# Patient Record
Sex: Female | Born: 1975 | Race: White | Hispanic: No | Smoking: Never smoker
Health system: Southern US, Community
[De-identification: ages and names within clinical notes are randomized; demographics above are authoritative.]

## PROBLEM LIST (undated history)

## (undated) DIAGNOSIS — K219 Gastro-esophageal reflux disease without esophagitis: Secondary | ICD-10-CM

## (undated) DIAGNOSIS — D649 Anemia, unspecified: Secondary | ICD-10-CM

## (undated) DIAGNOSIS — R0902 Hypoxemia: Secondary | ICD-10-CM

## (undated) DIAGNOSIS — J45909 Unspecified asthma, uncomplicated: Secondary | ICD-10-CM

## (undated) DIAGNOSIS — Z9889 Other specified postprocedural states: Secondary | ICD-10-CM

## (undated) DIAGNOSIS — I82409 Acute embolism and thrombosis of unspecified deep veins of unspecified lower extremity: Secondary | ICD-10-CM

## (undated) DIAGNOSIS — F32A Depression, unspecified: Secondary | ICD-10-CM

## (undated) DIAGNOSIS — M1990 Unspecified osteoarthritis, unspecified site: Secondary | ICD-10-CM

## (undated) DIAGNOSIS — R112 Nausea with vomiting, unspecified: Secondary | ICD-10-CM

## (undated) DIAGNOSIS — G4730 Sleep apnea, unspecified: Secondary | ICD-10-CM

## (undated) DIAGNOSIS — I8390 Asymptomatic varicose veins of unspecified lower extremity: Secondary | ICD-10-CM

## (undated) DIAGNOSIS — K5732 Diverticulitis of large intestine without perforation or abscess without bleeding: Secondary | ICD-10-CM

## (undated) HISTORY — DX: Asymptomatic varicose veins of unspecified lower extremity: I83.90

## (undated) HISTORY — DX: Acute embolism and thrombosis of unspecified deep veins of unspecified lower extremity: I82.409

## (undated) HISTORY — DX: Unspecified asthma, uncomplicated: J45.909

## (undated) HISTORY — DX: Anemia, unspecified: D64.9

## (undated) HISTORY — DX: Depression, unspecified: F32.A

## (undated) HISTORY — DX: Hypoxemia: R09.02

## (undated) HISTORY — PX: THROMBECTOMY: PRO61

## (undated) HISTORY — PX: ABDOMINAL HYSTERECTOMY: SHX81

---

## 1998-10-11 ENCOUNTER — Other Ambulatory Visit: Admission: RE | Admit: 1998-10-11 | Discharge: 1998-10-11 | Payer: Self-pay | Admitting: Family Medicine

## 1999-11-03 ENCOUNTER — Other Ambulatory Visit: Admission: RE | Admit: 1999-11-03 | Discharge: 1999-11-03 | Payer: Self-pay | Admitting: Family Medicine

## 2000-11-23 ENCOUNTER — Other Ambulatory Visit: Admission: RE | Admit: 2000-11-23 | Discharge: 2000-11-23 | Payer: Self-pay | Admitting: Family Medicine

## 2002-02-24 ENCOUNTER — Other Ambulatory Visit: Admission: RE | Admit: 2002-02-24 | Discharge: 2002-02-24 | Payer: Self-pay | Admitting: Family Medicine

## 2003-04-12 ENCOUNTER — Other Ambulatory Visit: Admission: RE | Admit: 2003-04-12 | Discharge: 2003-04-12 | Payer: Self-pay | Admitting: Internal Medicine

## 2004-04-01 ENCOUNTER — Ambulatory Visit: Payer: Self-pay | Admitting: Family Medicine

## 2004-05-01 ENCOUNTER — Ambulatory Visit: Payer: Self-pay | Admitting: General Surgery

## 2004-05-14 HISTORY — PX: CHOLECYSTECTOMY: SHX55

## 2004-10-23 ENCOUNTER — Ambulatory Visit: Payer: Self-pay | Admitting: Family Medicine

## 2004-12-19 ENCOUNTER — Other Ambulatory Visit: Admission: RE | Admit: 2004-12-19 | Discharge: 2004-12-19 | Payer: Self-pay | Admitting: Family Medicine

## 2004-12-19 ENCOUNTER — Ambulatory Visit: Payer: Self-pay | Admitting: Family Medicine

## 2004-12-19 LAB — CONVERTED CEMR LAB: Pap Smear: NORMAL

## 2005-01-13 ENCOUNTER — Ambulatory Visit: Payer: Self-pay | Admitting: Family Medicine

## 2005-10-02 ENCOUNTER — Ambulatory Visit: Payer: Self-pay | Admitting: Family Medicine

## 2005-12-21 ENCOUNTER — Ambulatory Visit: Payer: Self-pay | Admitting: Family Medicine

## 2006-04-20 ENCOUNTER — Ambulatory Visit: Payer: Self-pay | Admitting: Family Medicine

## 2006-05-06 ENCOUNTER — Encounter: Payer: Self-pay | Admitting: Family Medicine

## 2006-05-06 ENCOUNTER — Ambulatory Visit: Payer: Self-pay | Admitting: Obstetrics & Gynecology

## 2006-05-27 ENCOUNTER — Ambulatory Visit: Payer: Self-pay | Admitting: Obstetrics & Gynecology

## 2006-12-07 ENCOUNTER — Encounter: Payer: Self-pay | Admitting: Family Medicine

## 2006-12-07 ENCOUNTER — Ambulatory Visit: Payer: Self-pay | Admitting: Family Medicine

## 2006-12-21 ENCOUNTER — Ambulatory Visit: Payer: Self-pay | Admitting: Nurse Practitioner

## 2006-12-29 ENCOUNTER — Ambulatory Visit: Payer: Self-pay | Admitting: Obstetrics & Gynecology

## 2007-03-18 ENCOUNTER — Ambulatory Visit: Payer: Self-pay | Admitting: Family Medicine

## 2007-03-18 DIAGNOSIS — A5901 Trichomonal vulvovaginitis: Secondary | ICD-10-CM | POA: Insufficient documentation

## 2007-03-24 ENCOUNTER — Encounter: Payer: Self-pay | Admitting: Family Medicine

## 2007-05-30 ENCOUNTER — Encounter: Payer: Self-pay | Admitting: Family Medicine

## 2007-06-03 ENCOUNTER — Encounter: Payer: Self-pay | Admitting: Family Medicine

## 2007-07-20 ENCOUNTER — Ambulatory Visit: Payer: Self-pay | Admitting: Family Medicine

## 2007-09-14 ENCOUNTER — Encounter: Payer: Self-pay | Admitting: Family Medicine

## 2007-09-21 ENCOUNTER — Ambulatory Visit: Payer: Self-pay | Admitting: Family Medicine

## 2007-09-21 DIAGNOSIS — R030 Elevated blood-pressure reading, without diagnosis of hypertension: Secondary | ICD-10-CM | POA: Insufficient documentation

## 2007-10-05 ENCOUNTER — Encounter: Payer: Self-pay | Admitting: Family Medicine

## 2008-01-03 ENCOUNTER — Ambulatory Visit: Payer: Self-pay | Admitting: Family Medicine

## 2008-01-03 DIAGNOSIS — N760 Acute vaginitis: Secondary | ICD-10-CM | POA: Insufficient documentation

## 2008-01-04 LAB — CONVERTED CEMR LAB
Basophils Absolute: 0 10*3/uL (ref 0.0–0.1)
Eosinophils Absolute: 0.2 10*3/uL (ref 0.0–0.7)
Eosinophils Relative: 1.8 % (ref 0.0–5.0)
HCT: 42.8 % (ref 36.0–46.0)
MCHC: 34.1 g/dL (ref 30.0–36.0)
MCV: 91.1 fL (ref 78.0–100.0)
Monocytes Absolute: 0.6 10*3/uL (ref 0.1–1.0)
Neutrophils Relative %: 59.8 % (ref 43.0–77.0)
Platelets: 289 10*3/uL (ref 150–400)
RDW: 11.7 % (ref 11.5–14.6)
TSH: 0.92 microintl units/mL (ref 0.35–5.50)
WBC: 8.6 10*3/uL (ref 4.5–10.5)

## 2008-02-29 ENCOUNTER — Ambulatory Visit: Payer: Self-pay | Admitting: Family Medicine

## 2008-02-29 DIAGNOSIS — N926 Irregular menstruation, unspecified: Secondary | ICD-10-CM | POA: Insufficient documentation

## 2008-03-01 LAB — CONVERTED CEMR LAB
Hemoglobin: 14.1 g/dL (ref 12.0–15.0)
hCG, Beta Chain, Quant, S: <0.5 milliintl units/mL

## 2008-04-02 ENCOUNTER — Telehealth: Payer: Self-pay | Admitting: Family Medicine

## 2008-04-02 ENCOUNTER — Other Ambulatory Visit: Admission: RE | Admit: 2008-04-02 | Discharge: 2008-04-02 | Payer: Self-pay | Admitting: Family Medicine

## 2008-04-02 ENCOUNTER — Ambulatory Visit: Payer: Self-pay | Admitting: Family Medicine

## 2008-04-05 ENCOUNTER — Encounter (INDEPENDENT_AMBULATORY_CARE_PROVIDER_SITE_OTHER): Payer: Self-pay | Admitting: *Deleted

## 2008-05-11 ENCOUNTER — Telehealth: Payer: Self-pay | Admitting: Family Medicine

## 2008-07-16 ENCOUNTER — Telehealth: Payer: Self-pay | Admitting: Family Medicine

## 2009-05-27 ENCOUNTER — Ambulatory Visit: Payer: Self-pay | Admitting: Family Medicine

## 2009-10-21 ENCOUNTER — Ambulatory Visit: Payer: Self-pay | Admitting: Family Medicine

## 2009-10-21 DIAGNOSIS — M542 Cervicalgia: Secondary | ICD-10-CM | POA: Insufficient documentation

## 2009-10-21 DIAGNOSIS — S20219A Contusion of unspecified front wall of thorax, initial encounter: Secondary | ICD-10-CM | POA: Insufficient documentation
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
## 2010-05-13 NOTE — Letter (Signed)
Summary: Out of Work  [facility]  [address] [place], [place] [address]   Phone: [phone]  Fax: [phone]    May 27, 2009   Employee:  Kari Rice    To Whom It May Concern:   For Medical reasons, please excuse the above named employee from work for the following dates:  Start:   05/26/2009  End:   05/29/2009 if she is feeling better   If you need additional information, please feel free to contact our office.         Sincerely,    Judith Part MD

## 2010-05-13 NOTE — Assessment & Plan Note (Signed)
Summary: BODY ACHES AND FEVER / LFW   Vital Signs:  Patient profile:   35 year old female Height:      64 inches Weight:      210 pounds BMI:     36.18 Temp:     98.9 degrees F oral Pulse rate:   88 / minute Pulse rhythm:   regular BP sitting:   110 / 74  (left arm) Cuff size:   regular  Vitals Entered By: Lewanda Rife LPN (May 27, 2009 4:10 PM)  History of Present Illness: started getting sick 8 days ago stuffy nose and st -- then started feeling better fri  worse on saturday - cough and chest tightness and soreness runny and stuffy nose   no more st  non productive cough --- ratlling however  not wheezing  no n/v d   was running 100.3 fever this am -- took advil complete   ears feel full and clogged  some facial pain above both eyes     Allergies: 1)  Penicillin  Past History:  Past Medical History: Last updated: 09/21/2007 recurrent trichomonas- seeing ID   ID-- Dr Leavy Cella  Past Surgical History: Last updated: 01/03/2008 CT abd- gallstoens (03/2006) Cholecystectomy Pelvix [place]- neg (12/2004) Trichomonas- tx'd with flagyl (2007-2008) wisdom teeth ext 09  Family History: Last updated: 07/20/2007 Father: MI age 70- CAD, smoker, HTN, OA Mother: RA Siblings: 1 brother  Social History: Last updated: 04/02/2008 Marital Status: Married Children:  Occupation: [employer] non smoker no alcohol   Risk Factors: Smoking Status: never (03/18/2007)  Review of Systems General:  Complains of chills, fatigue, fever, loss of appetite, and malaise. Eyes:  Denies blurring and discharge. ENT:  Complains of hoarseness, nasal congestion, postnasal drainage, sinus pressure, and sore throat. CV:  Denies chest pain or discomfort and palpitations. Resp:  Complains of cough and wheezing. GI:  Denies abdominal pain, bloody stools, and change in bowel habits. Derm:  Denies itching, lesion(s), poor wound healing, and rash. Neuro:  Denies headaches.  Physical  Exam  General:  overweight but generally well appearing  Head:  normocephalic, atraumatic, and no abnormalities observed.  no sinus tenderness Eyes:  vision grossly intact, pupils equal, pupils round, pupils reactive to light, and no injection.   Lungs:  CTA wiht harsh bs and rhonchi at bases    Impression & Recommendations:  Problem # 1:  ACUTE BRONCHITIS (ICD-466.0) Assessment New with persistant cough after uri and worsening symptoms with fever cover with zithromax for poss bacteria recommend sympt care- see pt instructions   pt advised to update me if symptoms worsen or do not improve - esp if wheeze or worse cough The following medications were removed from the medication list:    Tindamax 500 Mg Tabs (Tinidazole) .Marland KitchenMarland KitchenMarland KitchenMarland Kitchen 4 by mouth once daily for five days Her updated medication list for this problem includes:    Advil Cold/sinus 30-200 Mg Tabs (Pseudoephedrine-ibuprofen) ..... Otc as directed.    Zithromax Z-pak 250 Mg Tabs (Azithromycin) .Marland Kitchen... Take by mouth as directed  Orders: Prescription Created Electronically [phone] [phone])  Complete Medication List: 1)  Advil Cold/sinus 30-200 Mg Tabs (Pseudoephedrine-ibuprofen) .... Otc as directed. 2)  Zithromax Z-pak 250 Mg Tabs (Azithromycin) .... Take by mouth as directed  Patient Instructions: 1)  you can try mucinex DM  over the counter twice daily as directed and nasal saline spray for congestion 2)  tylenol over the counter as directed may help with aches, headache and fever 3)  call if symptoms worsen  or if not improved in 4-5 days  4)  take the zithromax as directed  5)  I sent your px to the pharmacy Prescriptions: ZITHROMAX Z-PAK 250 MG TABS (AZITHROMYCIN) take by mouth as directed  #1pack x 0   Entered and Authorized by:   Judith Part MD   Signed by:   Judith Part MD on 05/27/2009   Method used:   Electronically to        [facility] [address] (retail)       [address]       [place], [place]   [address]       Ph: [phone]       Fax: [phone]   RxID:   [id number]   Current Allergies (reviewed today): PENICILLIN

## 2010-06-02 ENCOUNTER — Ambulatory Visit: Payer: Self-pay | Admitting: Family Medicine

## 2010-06-04 ENCOUNTER — Ambulatory Visit (INDEPENDENT_AMBULATORY_CARE_PROVIDER_SITE_OTHER): Payer: BC Managed Care – PPO | Admitting: Family Medicine

## 2010-06-04 ENCOUNTER — Other Ambulatory Visit: Payer: Self-pay | Admitting: Family Medicine

## 2010-06-04 ENCOUNTER — Ambulatory Visit (INDEPENDENT_AMBULATORY_CARE_PROVIDER_SITE_OTHER)
Admission: RE | Admit: 2010-06-04 | Discharge: 2010-06-04 | Disposition: A | Discharge status: Home / Self Care | Payer: BC Managed Care – PPO | Source: Ambulatory Visit | Attending: Family Medicine | Admitting: Family Medicine

## 2010-06-04 ENCOUNTER — Encounter: Payer: Self-pay | Admitting: Family Medicine

## 2010-06-04 DIAGNOSIS — M25569 Pain in unspecified knee: Secondary | ICD-10-CM | POA: Insufficient documentation

## 2010-06-10 NOTE — Assessment & Plan Note (Signed)
Summary: RIGHT KNEE PAIN/CLE   [employer]   Vital Signs:  Patient profile:   35 year old female Height:      64 inches Weight:      215.50 pounds BMI:     37.12 Temp:     98.7 degrees F oral Pulse rate:   66 / minute Pulse rhythm:   regular BP sitting:   110 / 74  (left arm) Cuff size:   regular  Vitals Entered ByMelody Comas (June 04, 2010 10:10 AM) CC: right knee pain   History of Present Illness: 35 year old female:  Patient presents with several month h/o r sided knee pain after no specific injury.No symptomatic giving-way. No mechanical clicking - but there is crepitus. Joint has not locked up. Patient has been able to walk  The patient does have pain going up and down stairs or rising from a seated position.   Pain location: anterior and lateral Current physical activity: minimal Prior Knee Surgery: none Current pain meds: tylenol and advil Bracing: none Occupation or school level:   moved to [place] recently. now has stairs in her apartment.  tight in the anterior knee.   Tylenol and adbill not helping.   down stairs bothers her.  not as bad rising from a seated position.     Allergies: 1)  Penicillin  Past History:  Past medical, surgical, family and social histories (including risk factors) reviewed, and no changes noted (except as noted below).  Past Medical History: Reviewed history from 09/21/2007 and no changes required. recurrent trichomonas- seeing ID   ID-- Dr Leavy Cella  Past Surgical History: Reviewed history from 01/03/2008 and no changes required. CT abd- gallstoens (03/2006) Cholecystectomy Pelvix [place]- neg (12/2004) Trichomonas- tx'd with flagyl (2007-2008) wisdom teeth ext 09  Family History: Reviewed history from 07/20/2007 and no changes required. Father: MI age 43- CAD, smoker, HTN, OA Mother: RA Siblings: 1 brother  Social History: Reviewed history from 04/02/2008 and no changes required. Marital Status:  Married Children:  Occupation: [employer] non smoker no alcohol   Review of Systems       REVIEW OF SYSTEMS  GEN: No systemic complaints, no fevers, chills, sweats, or other acute illnesses MSK: Detailed in the HPI GI: tolerating PO intake without difficulty Neuro: No numbness, parasthesias, or tingling associated. Otherwise the pertinent positives of the ROS are noted above.    Physical Exam  General:  GEN: Well-developed,well-nourished,in no acute distress; alert,appropriate and cooperative throughout examination HEENT: Normocephalic and atraumatic without obvious abnormalities. No apparent alopecia or balding. Ears, externally no deformities PULM: Breathing comfortably in no respiratory distress EXT: No clubbing, cyanosis, or edema PSYCH: Normally interactive. Cooperative during the interview. Pleasant. Friendly and conversant. Not anxious or depressed appearing. Normal, full affect.  Msk:  R knee Gait: Normal heel toe pattern ROM: WNL Effusion: mild Echymosis or edema: none Patellar tendon NT Painful PLICA: neg Patellar grind: POSITIVE Medial and lateral patellar facet loading: MILD PAIN medial and lateral joint lines: LATERAL JOINT LINE TTP Mcmurray's neg Flexion-pinch neg Varus and valgus stress: stable Lachman: neg Ant and Post drawer: neg Hip abduction, IR, ER: WNL str testing nt   Impression & Recommendations:  Problem # 1:  KNEE PAIN, RIGHT (ICD-719.46) X-rays: AP Bilateral Weight-bearing, Weightbearing Lateral, Sunrise views Indication: knee pain Findings:  small caudal spur on superior patella. mild lateral displacement on sunrise views. no fracture or dislocatoin.  PFS with suspected at least mild OA PF joint lateral pain, cannot exclude mensical pathology  Lodine, rest, then Harvard knee HEP  Knee Injection, R Patient verbally consented to procedure. Risks, benefits, and alternatives explained. Sterilely prepped with betadine. Ethyl cholride used for  anesthesia. 9 cc Lidocaine 1% mixed with 1 cc of Kenalog 40 mg injected using the anterolateral approach without difficulty. No complications with procedure and tolerated well. Patient had decreased pain post-injection.   The following medications were removed from the medication list:    Flexeril 10 Mg Tabs (Cyclobenzaprine hcl) .Marland Kitchen... 1/2 to 1 by mouth up to three times a day as needed muscle spasm 30 Her updated medication list for this problem includes:    Advil 200 Mg Tabs (Ibuprofen) ..... Otc as directed.    Tylenol Extra Strength 500 Mg Tabs (Acetaminophen) ..... Otc as directed.    Etodolac 400 Mg Tabs (Etodolac) .Marland Kitchen... 1 by mouth two times a day  Orders: T-Knee Right 2 view (73560TC) T-DG Knee Bilateral Standing AP (04540) Joint Aspirate / Injection, Large (20610) Kenalog 10mg  (4units) (J3301)  Problem # 2:  PATELLO-FEMORAL SYNDROME (ICD-719.46)  The following medications were removed from the medication list:    Flexeril 10 Mg Tabs (Cyclobenzaprine hcl) .Marland Kitchen... 1/2 to 1 by mouth up to three times a day as needed muscle spasm 30 Her updated medication list for this problem includes:    Advil 200 Mg Tabs (Ibuprofen) ..... Otc as directed.    Tylenol Extra Strength 500 Mg Tabs (Acetaminophen) ..... Otc as directed.    Etodolac 400 Mg Tabs (Etodolac) .Marland Kitchen... 1 by mouth two times a day  Orders: Joint Aspirate / Injection, Large (20610) Kenalog 10mg  (4units) (J3301)  Complete Medication List: 1)  Advil 200 Mg Tabs (Ibuprofen) .... Otc as directed. 2)  Tylenol Extra Strength 500 Mg Tabs (Acetaminophen) .... Otc as directed. 3)  Etodolac 400 Mg Tabs (Etodolac) .Marland Kitchen.. 1 by mouth two times a day Prescriptions: ETODOLAC 400 MG TABS (ETODOLAC) 1 by mouth two times a day  #60 x 2   Entered and Authorized by:   Hannah Beat MD   Signed by:   Hannah Beat MD on 06/04/2010   Method used:   Print then Give to Patient   RxID:   [id number]    Orders Added: 1)  T-Knee Right 2  view [73560TC] 2)  T-DG Knee Bilateral Standing AP [73565] 3)  Est. Patient Level IV [08657] 4)  Joint Aspirate / Injection, Large [20610] 5)  Kenalog 10mg  (4units) [J3301]    Current Allergies (reviewed today): PENICILLIN

## 2010-08-26 NOTE — Assessment & Plan Note (Signed)
Kari Rice, Kari Rice                 ACCOUNT NO.:  [id number]   MEDICAL RECORD NO.:  [id number]          PATIENT TYPE:  POB   LOCATION:  [facility]         FACILITY:  [facility]   PHYSICIAN:  Tinnie Gens, MD        DATE OF BIRTH:  [birth year]   DATE OF SERVICE:  12/21/2006                                  CLINIC NOTE   HISTORY OF PRESENT ILLNESS:  The patient comes to the office today for  recurrent _______trich.  She has had multiple episodes of this starting  last year.  She was diagnosed by her primary care physician, as having  had trichomonas twice.  She was treated on both of those occasions.  On  her third time to return to his office, she was then sent to the  gynecologist.  In this office she had been treated once.  When the trich  _________recurred at this point, she was given a long-term treatment of  vinegar and water to use for 3 months when the discharge became heavy.  She is in the office today desiring insertion of an IUD for regulation  of her menstrual flow as well as a recheck on her trichomonas.  She is  not sexually active.  Has not been sexually active in approximately 1  year.  She has been using a vibrator.  She does feel that she keeps this  clean and has been using antiseptic wipes.  She has been on birth  control pills in the past for many right and has not had any issues with  taking birth control pills.  She is not a smoker.  She denies migraine  headaches.  She does not have hypertension.   VITAL SIGNS:  Blood pressure is 116/76, pulse is 88, weight is 208.   PHYSICAL EXAMINATION:  GENITOURINARY:  Genitalia exam externally she is  quite excoriated from throughout most of her perineal area.  She does  have a glistening discharge externally.  Vaginally, she has a thick  greenish odorous discharge.  Cervix is closed, easily friable.  Bimanual  exam there is no cervical motion tenderness and no adnexal  lymphadenopathy.   ASSESSMENT/PLAN:  1. Recurrent  trichomonas.  The patient has asked at this point to      replace her vibrator.  We will repeat her wet prep, gonorrhea and      chlamydia today.  We will also check human immunodeficiency virus,      hepatitis C, herpes simplex virus RPR, chem-20 and complete blood      count.  2. External yeast.  She is given Diflucan 150 milligrams 1 time dose.      She is given several refills on this.  She is given a lengthy      instruction on loose cotton clothing, going without underwear at      bedtime, using a hair dryer on cool.  3. Menstrual regulation.  We have agreed that this is certainly not      the time to use an IUD.  She is using the IUD for control of her      menstrual cycle, not for contraception.  We  will restart her birth      control pills.  She is given 2      months' supply of Ortho Tri-Cyclen Lo as well as a prescription for      1 year.  She will follow up in 1 week.  We should have the results      of all of her testing back by then.      Remonia Richter, NP    ______________________________  Tinnie Gens, MD    LR/MEDQ  D:  12/21/2006  T:  12/22/2006  Job:  [id number]

## 2010-08-29 NOTE — Assessment & Plan Note (Signed)
Kari Rice, Kari Rice                 ACCOUNT NO.:  [id number]   MEDICAL RECORD NO.:  [id number]          PATIENT TYPE:  POB   LOCATION:  [facility]         FACILITY[facility]  [facility]   PHYSICIAN:  Elsie Lincoln, MD      DATE OF BIRTH:  [birth year]   DATE OF SERVICE:                                  CLINIC NOTE   This patient is seen in consultation by Dr. Milinda Antis.   Patient is a 35 year old nulliparous female; LMP, currently, started on  May 04, 2006, and currently bleeding.  Patient is a patient of Dr.  Milinda Antis, and she has been treated for trich twice; once with 750 mg daily  for a week and then once with 1000 mg daily per week per the patient.  The patient has never had any other sexually transmitted diseases and  she is not sexually active currently.  She last had sex in August.  The  patient states that she had GC/chlamydia cultures done by Dr. Milinda Antis,  which were negative.  She never had any gynecological problems like  ovarian cyst or fibroid tumors.  She did have an abnormal Pap smear and  it was treated with repeat surveillance.  Her date of her last Pap smear  was January 8, and she states that was normal as well.   PAST MEDICAL HISTORY:  Denies.   PAST SURGICAL HISTORY:  Cholecystectomy in 2005 at [facility].   FAMILY HISTORY:  Positive for heart disease and high blood pressure and  a heart attack in her father.  Her father has had skin cancer, and her  mother has had cervical cancer.  Mom also has hypothyroidism.   PHYSICAL EXAMINATION:  GENITALIA:  Tanner 5.  VAGINA:  Positive blood from menses, but otherwise normal.  No obvious  frothy discharge.  Cervix closed.   ASSESSMENT AND PLAN:  A 35 year old female with recurrent trichomonas.  Per Dr. Milinda Antis, a wet prep shows only bacterial vaginitis.  We will treat  with Tindamax 2 grams times 1 to empirically treat for trichomonas.  It  will also treat bacterial vaginosis, although not as well as the weekly  doses.  She has been on a weekly dose before which has not really  worked.  She is to come back in 2 weeks to see if she is any better.           ______________________________  Elsie Lincoln, MD     KL/MEDQ  D:  05/06/2006  T:  05/06/2006  Job:  [id number]   cc:   Dr. Milinda Antis

## 2011-04-14 DIAGNOSIS — I82409 Acute embolism and thrombosis of unspecified deep veins of unspecified lower extremity: Secondary | ICD-10-CM

## 2011-04-14 HISTORY — DX: Acute embolism and thrombosis of unspecified deep veins of unspecified lower extremity: I82.409

## 2011-10-26 ENCOUNTER — Ambulatory Visit (INDEPENDENT_AMBULATORY_CARE_PROVIDER_SITE_OTHER): Payer: Self-pay | Admitting: Family Medicine

## 2011-10-26 ENCOUNTER — Encounter: Payer: Self-pay | Admitting: Family Medicine

## 2011-10-26 VITALS — BP 133/83 | HR 93 | Temp 98.5°F | Ht 64.0 in | Wt 231.2 lb

## 2011-10-26 DIAGNOSIS — I872 Venous insufficiency (chronic) (peripheral): Secondary | ICD-10-CM

## 2011-10-26 DIAGNOSIS — R609 Edema, unspecified: Secondary | ICD-10-CM

## 2011-10-26 DIAGNOSIS — R600 Localized edema: Secondary | ICD-10-CM

## 2011-10-26 NOTE — Progress Notes (Signed)
  Subjective:    Patient ID: Kari Rice, female    DOB: 08-Mar-1976, 36 y.o.   MRN: [id number]  HPI Has not been here for a while -insurance issues  Now can come back    R leg swollen for 1 1/2 weeks  This happened once in the past -aleve helped  No injury Poss due to her varicose veins  Just started working again -- went from sedentary to standing Is dependent edema  Gets sore if she stands from a long time  Pain is in the back of the knee Other leg is fine (veins are worse in the other legs )  Is wearing compression socks today -- and they do help some Veins to to thigh level, however  Bad veins run in family  In addn, has gained 15 lb since last visit  Patient Active Problem List  Diagnosis  . TRICHOMONAL VAGINITIS  . UNSPECIFIED VAGINITIS AND VULVOVAGINITIS  . IRREGULAR MENSES  . NECK PAIN  . ELEVATED BP W/O HYPERTENSION  . CONTUSION, CHEST WALL  . Pain in joint, lower leg  . Venous insufficiency, peripheral  . Pedal edema   No past medical history on file. No past surgical history on file. History  Substance Use Topics  . Smoking status: Never Smoker   . Smokeless tobacco: Not on file  . Alcohol Use: No   No family history on file. Allergies  Allergen Reactions  . Penicillins     REACTION: rash  . Sulfa Antibiotics Rash   No current outpatient prescriptions on file prior to visit.       Review of Systems Review of Systems  Constitutional: Negative for fever, appetite change, fatigue and unexpected weight change.  Eyes: Negative for pain and visual disturbance.  Respiratory: Negative for cough and shortness of breath.   Cardiovascular: Negative for cp or palpitations  Neg for sob/ PND or orthopnea   Gastrointestinal: Negative for nausea, diarrhea and constipation.  Genitourinary: Negative for urgency and frequency.  Skin: Negative for pallor or rash   Neurological: Negative for weakness, light-headedness, numbness and headaches.  Hematological:  Negative for adenopathy. Does not bruise/bleed easily.  Psychiatric/Behavioral: Negative for dysphoric mood. The patient is not nervous/anxious.         Objective:   Physical Exam  Constitutional: She appears well-developed and well-nourished.  HENT:  Head: Normocephalic and atraumatic.  Eyes: Conjunctivae and EOM are normal. Pupils are equal, round, and reactive to light. No scleral icterus.  Neck: Normal range of motion. Neck supple. No JVD present. Carotid bruit is not present.  Cardiovascular: Normal rate, regular rhythm, normal heart sounds and intact distal pulses.  Exam reveals no gallop.   Musculoskeletal: Normal range of motion. She exhibits edema. She exhibits no tenderness.       One plus edema in R ankle and leg/ trace in L  Some color change in skin on R - hyperemic slightly Varicosities noted  Nl rom knee -not tender today  No calf tenderness or palpable cords   Neurological: She is alert. She has normal reflexes.  Skin: Skin is warm. No rash noted. No pallor.  Psychiatric: She has a normal mood and affect.          Assessment & Plan:

## 2011-10-26 NOTE — Assessment & Plan Note (Signed)
Worse in R leg from venous insuff  See that assessment for plan

## 2011-10-26 NOTE — Patient Instructions (Addendum)
We will do the vein clinic referral at check out  I think venous insufficiency is causing your swelling  Avoid salty foods and drink lots of water Also elevate feet when you are sitting  Try the px support hose- take px to a medical supply company

## 2011-10-26 NOTE — Assessment & Plan Note (Signed)
Chronic and much worse in R leg - from thigh down , worse after 15 lb wt loss and new job with prolonged standing Current supp socks not working Px 15-20 mm Hg stockings to waist  Disc salt avoidance and elevation of legs and wt loss Also ref to vein clinic for eval

## 2011-11-03 ENCOUNTER — Telehealth: Payer: Self-pay | Admitting: Family Medicine

## 2011-11-03 MED ORDER — LEVOFLOXACIN 500 MG PO TABS: 500.0000 mg | ORAL_TABLET | Freq: Every day | ORAL | Status: AC

## 2011-11-03 NOTE — Telephone Encounter (Signed)
I am actually worried about infection at this point  I send abx to her listed pharmacy-start it now -levaquin and schedule appt to see me or first avail tomorrow  If worse overnight or fever - please go to ER

## 2011-11-03 NOTE — Telephone Encounter (Signed)
Pt left v/m seen last week; leg is worse; more swollen and can hardly bend leg. Wants steroid sent to pharmacy until can see vein specialist. Pt has appt with Dr Edilia Bo 11/25/11; tried to call pt back and left v/m for pt to call back to get more details and pts pharmacy.Please advise.

## 2011-11-03 NOTE — Telephone Encounter (Signed)
Patient called and wants to know if you can send her in a Rx for a steroid because her leg is swelling again.

## 2011-11-03 NOTE — Telephone Encounter (Signed)
Patient wants you to refer her to a Vein Specialist in [place] now instead of [place]. Her appt is August 14th at 9:45 with Dr Edilia Bo at [facility]. Please put new Vein Surgery referral in for this patient and call her on the mobile.

## 2011-11-03 NOTE — Telephone Encounter (Signed)
Please ask her if any redness/ warmth or fever or ulcers on leg -- I worry about infection  Thanks  Confirm with her - she will be seeing Dr Edilia Bo

## 2011-11-03 NOTE — Telephone Encounter (Signed)
Pt states her leg is swollen from upper leg to foot, she states it hurt to walk on it, its warm to the touch, and red.She has an appoint with Dr.Dickerson on 11/25/2011, that the earliest she could get.

## 2011-11-04 NOTE — Telephone Encounter (Signed)
Left message on  patient vm and informed her  as instructed.

## 2011-11-05 NOTE — Telephone Encounter (Signed)
Called and spoke to patient on the phone made her FU appt with Dr Milinda Antis for Friday at 11:15am. Appt Vas Surgeon on 11/25/2011 arrrive by 9:50am. MK

## 2011-11-06 ENCOUNTER — Ambulatory Visit (INDEPENDENT_AMBULATORY_CARE_PROVIDER_SITE_OTHER): Payer: 59 | Admitting: Family Medicine

## 2011-11-06 ENCOUNTER — Telehealth: Payer: Self-pay

## 2011-11-06 ENCOUNTER — Encounter: Payer: Self-pay | Admitting: Family Medicine

## 2011-11-06 ENCOUNTER — Ambulatory Visit: Payer: Self-pay | Admitting: Family Medicine

## 2011-11-06 VITALS — BP 135/85 | HR 77 | Temp 98.2°F | Ht 64.0 in | Wt 228.8 lb

## 2011-11-06 DIAGNOSIS — I872 Venous insufficiency (chronic) (peripheral): Secondary | ICD-10-CM

## 2011-11-06 DIAGNOSIS — I803 Phlebitis and thrombophlebitis of lower extremities, unspecified: Secondary | ICD-10-CM

## 2011-11-06 NOTE — Telephone Encounter (Signed)
Angie with [place] called report; rt leg doppler neg DVT limited study due to edema. Pt waiting. Dr Milinda Antis said pt can go home and Angie will advise pt no clot.

## 2011-11-06 NOTE — Progress Notes (Signed)
Subjective:    Patient ID: Kari Rice, female    DOB: 1975-05-16, 36 y.o.   MRN: [id number]  HPI R leg - got worse since last visit with phlebitis -- more swollen and was starting to have red splotches  More uncomfortable   Suspecting poss cellulitis/ phlebitis -started her on levaquin That has helped the redness Using ice and elevating it  Will get supp stockings today- can pay for it    She can flex her knee and ankle better  Has appt upcoming with vasc office on 8/14 Will need a venous doppler  Patient Active Problem List  Diagnosis  . TRICHOMONAL VAGINITIS  . UNSPECIFIED VAGINITIS AND VULVOVAGINITIS  . IRREGULAR MENSES  . NECK PAIN  . ELEVATED BP W/O HYPERTENSION  . CONTUSION, CHEST WALL  . Pain in joint, lower leg  . Venous insufficiency, peripheral  . Pedal edema   No past medical history on file. No past surgical history on file. History  Substance Use Topics  . Smoking status: Never Smoker   . Smokeless tobacco: Not on file  . Alcohol Use: No   No family history on file. Allergies  Allergen Reactions  . Penicillins     REACTION: rash  . Sulfa Antibiotics Rash   Current Outpatient Prescriptions on File Prior to Visit  Medication Sig Dispense Refill  . levofloxacin (LEVAQUIN) 500 MG tablet Take 1 tablet (500 mg total) by mouth daily.  7 tablet  0  . NON FORMULARY Support hose to the waist for dx of venous insufficiency and edema 15-20 mm Hg          Review of Systems    Review of Systems  Constitutional: Negative for fever, appetite change, fatigue and unexpected weight change.  Eyes: Negative for pain and visual disturbance.  Respiratory: Negative for cough and shortness of breath.   Cardiovascular: Negative for cp or palpitations    Gastrointestinal: Negative for nausea, diarrhea and constipation.  Genitourinary: Negative for urgency and frequency.  Skin: Negative for pallor or rash  pos for redness of leg that is improved  MSK pos for  swelling of R leg with varicosities  Neurological: Negative for weakness, light-headedness, numbness and headaches.  Hematological: Negative for adenopathy. Does not bruise/bleed easily.  Psychiatric/Behavioral: Negative for dysphoric mood. The patient is not nervous/anxious.      Objective:   Physical Exam  Constitutional: She appears well-developed and well-nourished. No distress.       Obese and well appearing   HENT:  Head: Normocephalic and atraumatic.  Eyes: Conjunctivae and EOM are normal. Pupils are equal, round, and reactive to light.  Neck: Normal range of motion. Neck supple. No JVD present. Carotid bruit is not present. No thyromegaly present.  Cardiovascular: Normal rate, regular rhythm, normal heart sounds and intact distal pulses.  Exam reveals no gallop.   Pulmonary/Chest: Effort normal and breath sounds normal. No respiratory distress. She has no wheezes.  Abdominal: Soft. Bowel sounds are normal. She exhibits no abdominal bruit.  Musculoskeletal: She exhibits edema.       Diffuse swelling in right leg with varicosities that are compressible and mildly tender Some areas of erythema on calf and behind knee No palp cords Neg homan sign    Lymphadenopathy:    She has no cervical adenopathy.  Neurological: She is alert. She has normal reflexes.  Skin: Skin is warm and dry. No rash noted. There is erythema. No pallor.  Psychiatric: She has a normal mood and affect.  Assessment & Plan:

## 2011-11-06 NOTE — Assessment & Plan Note (Signed)
With swelling of R leg and varicosities  Is getting support hose today Suspect some phlebitis now - though much imp with levaquin  No ulcers  Will order venous duplex of her leg also

## 2011-11-06 NOTE — Telephone Encounter (Signed)
Good news Continue the levaquin and update me with how you are doing next week  Elevate leg

## 2011-11-06 NOTE — Telephone Encounter (Signed)
Left message on patient vm advising her as instructed.

## 2011-11-06 NOTE — Patient Instructions (Addendum)
Continue the levaquin- I'm glad it is helping Get your support hose Keep leg elevated whenever possible and warm compresses sometimes help  Talk to [facility] on the way out about scheduling a venous ultrasound of the leg today and also about your upcoming vascular appt

## 2011-11-06 NOTE — Assessment & Plan Note (Signed)
tx with levaquin -is improved  Will elevate and get her supp hose  appt planned with vascular office for eval  Venous doppler of leg ordered

## 2011-11-13 ENCOUNTER — Encounter: Payer: Self-pay | Admitting: Family Medicine

## 2011-11-18 ENCOUNTER — Other Ambulatory Visit: Payer: Self-pay

## 2011-11-18 DIAGNOSIS — I83893 Varicose veins of bilateral lower extremities with other complications: Secondary | ICD-10-CM

## 2011-11-18 DIAGNOSIS — R609 Edema, unspecified: Secondary | ICD-10-CM

## 2011-11-24 ENCOUNTER — Encounter: Payer: Self-pay | Admitting: Vascular Surgery

## 2011-11-25 ENCOUNTER — Encounter: Payer: Self-pay | Admitting: Vascular Surgery

## 2011-11-25 ENCOUNTER — Encounter (INDEPENDENT_AMBULATORY_CARE_PROVIDER_SITE_OTHER): Payer: 59 | Admitting: *Deleted

## 2011-11-25 ENCOUNTER — Ambulatory Visit (INDEPENDENT_AMBULATORY_CARE_PROVIDER_SITE_OTHER): Payer: 59 | Admitting: Vascular Surgery

## 2011-11-25 VITALS — BP 132/83 | HR 74 | Resp 18 | Ht 64.0 in | Wt 220.0 lb

## 2011-11-25 DIAGNOSIS — I872 Venous insufficiency (chronic) (peripheral): Secondary | ICD-10-CM

## 2011-11-25 DIAGNOSIS — I83893 Varicose veins of bilateral lower extremities with other complications: Secondary | ICD-10-CM

## 2011-11-25 DIAGNOSIS — R609 Edema, unspecified: Secondary | ICD-10-CM

## 2011-11-25 DIAGNOSIS — I8000 Phlebitis and thrombophlebitis of superficial vessels of unspecified lower extremity: Secondary | ICD-10-CM

## 2011-11-25 NOTE — Addendum Note (Signed)
Addended by: Sharee Pimple on: 11/25/2011 03:52 PM   Modules accepted: Orders

## 2011-11-25 NOTE — Progress Notes (Signed)
[facility]  Patient name: Kari Rice MRN: [id number] DOB: [birth year] Sex: female  REASON FOR CONSULT: right lower extremity swelling with venous insufficiency. Referred by Dr. Milinda Antis.  HPI: Kari Rice is a 36 y.o. female with a long history of varicose veins of both lower extremities. She began having increasing swelling in the right leg in July and underwent a venous duplex scan which showed no evidence of DVT. This was done at [facility] on 11/06/2011. There was no evidence of thrombus in the right femoral or popliteal veins. It was somewhat limited study. The patient has had increasing swelling in the right leg especially. She does spend most of her time at work standing. She's worked at [employer]. Her symptoms are tolerable. She does have some aching pain in the legs is a with prolonged standing and fatigue.  Past Medical History  Diagnosis Date  . Varicose veins     Family History  Problem Relation Age of Onset  . Arthritis Mother   . Heart disease Father     SOCIAL HISTORY: History  Substance Use Topics  . Smoking status: Never Smoker   . Smokeless tobacco: Never Used  . Alcohol Use: No    Allergies  Allergen Reactions  . Penicillins     REACTION: rash  . Sulfa Antibiotics Rash    Current Outpatient Prescriptions  Medication Sig Dispense Refill  . NON FORMULARY Support hose to the waist for dx of venous insufficiency and edema 15-20 mm Hg        REVIEW OF SYSTEMS: Kari Rice ] denotes positive finding; [  ] denotes negative finding  CARDIOVASCULAR:  [ ]  chest pain   [ ]  chest pressure   [ ]  palpitations   [ ]  orthopnea   [ ]  dyspnea on exertion   [ ]  claudication   [ ]  rest pain   [ ]  DVT   Kari Rice ] phlebitis PULMONARY:   [ ]  productive cough   [ ]  asthma   [ ]  wheezing NEUROLOGIC:   [ ]  weakness  [ ]  paresthesias  [ ]  aphasia  [ ]  amaurosis  [ ]  dizziness HEMATOLOGIC:   [ ]  bleeding problems   [ ]  clotting  disorders MUSCULOSKELETAL:  [ ]  joint pain   [ ]  joint swelling Kari Rice ] leg swelling GASTROINTESTINAL: [ ]   blood in stool  [ ]   hematemesis GENITOURINARY:  [ ]   dysuria  [ ]   hematuria PSYCHIATRIC:  [ ]  history of major depression INTEGUMENTARY:  [ ]  rashes  [ ]  ulcers CONSTITUTIONAL:  [ ]  fever   [ ]  chills  PHYSICAL EXAM: Filed Vitals:   11/25/11 1124  BP: 132/83  Pulse: 74  Resp: 18  Height: 5\' 4"  (1.626 m)  Weight: 220 lb (99.791 kg)   Body mass index is 37.76 kg/(m^2). GENERAL: The patient is a well-nourished female, in no acute distress. The vital signs are documented above. CARDIOVASCULAR: There is a regular rate and rhythm without significant murmur appreciated. I do not detect carotid bruits. She has palpable femoral popliteal and pedal pulses. She has significant right lower extremity swelling. PULMONARY: There is good air exchange bilaterally without wheezing or rales. ABDOMEN: Soft and non-tender with normal pitched bowel sounds.  MUSCULOSKELETAL: There are no major deformities or cyanosis. NEUROLOGIC: No focal weakness or paresthesias are detected. SKIN: she has enlarged truncal varicosities along the anterior medial aspect of her right lower extremity which extended to her medial right  leg. She has some truncal varicosities in the anterior aspect of her left thigh. PSYCHIATRIC: The patient has a normal affect.  DATA:  I have independently interpreted her venous duplex scan. 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 There is no evidence of DVT noted. There is some incompetence of the right deep veins also and also the flow is not aphasic in the right common femoral vein suggesting a possible outflow obstruction. There is some reflux in the left greater saphenous vein also but not as significant.  I have reviewed her records from Dr. Royden Purl office. She does have a history of some mildly elevated blood pressure. This has not been a significant issue  for her.  MEDICAL ISSUES:  RIGHT LEG SWELLING: This patient does have evidence of venous insufficiency in the right leg especially. She does not have any evidence of DVT. We have discussed the importance of intermittent leg elevation and the proper positioning for this. In addition I have written a prescription for a thigh high compression stocking with a 20-30 mmHg pressure gradient. Given the abnormal flow in the femoral vein on the right I have ordered a CT scan of the abdomen and pelvis to be sure that there is no mass compressing the veins or lymphatics on the right. Assuming that the CT scan is unremarkable. I'll arrange for a follow up visit in 3 months with Dr. Hart Rochester or Dr. Arbie Cookey to consider laser ablation of right greater saphenous vein if her symptoms have not improved. She knows to call sooner if she has problems.   Yuka Lallier S [facility] [place] Beeper: [phone]

## 2011-12-03 ENCOUNTER — Ambulatory Visit
Admission: RE | Admit: 2011-12-03 | Discharge: 2011-12-03 | Disposition: A | Discharge status: Home / Self Care | Payer: 59 | Source: Ambulatory Visit | Attending: Vascular Surgery | Admitting: Vascular Surgery

## 2011-12-03 DIAGNOSIS — I872 Venous insufficiency (chronic) (peripheral): Secondary | ICD-10-CM

## 2011-12-03 MED ORDER — IOHEXOL 300 MG/ML  SOLN
100.0000 mL | Freq: Once | INTRAMUSCULAR | Status: AC | PRN
  Administered 2011-12-03: 100 mL via INTRAVENOUS

## 2011-12-04 ENCOUNTER — Encounter (INDEPENDENT_AMBULATORY_CARE_PROVIDER_SITE_OTHER): Payer: 59

## 2011-12-04 DIAGNOSIS — I872 Venous insufficiency (chronic) (peripheral): Secondary | ICD-10-CM

## 2011-12-15 ENCOUNTER — Ambulatory Visit (INDEPENDENT_AMBULATORY_CARE_PROVIDER_SITE_OTHER): Payer: 59 | Admitting: Family Medicine

## 2011-12-15 ENCOUNTER — Encounter: Payer: Self-pay | Admitting: Family Medicine

## 2011-12-15 VITALS — BP 120/88 | HR 95 | Temp 98.2°F | Resp 16 | Wt 218.5 lb

## 2011-12-15 DIAGNOSIS — I803 Phlebitis and thrombophlebitis of lower extremities, unspecified: Secondary | ICD-10-CM

## 2011-12-15 DIAGNOSIS — L729 Follicular cyst of the skin and subcutaneous tissue, unspecified: Secondary | ICD-10-CM | POA: Insufficient documentation

## 2011-12-15 DIAGNOSIS — L723 Sebaceous cyst: Secondary | ICD-10-CM

## 2011-12-15 MED ORDER — DOXYCYCLINE HYCLATE 100 MG PO TABS: 100.0000 mg | ORAL_TABLET | Freq: Two times a day (BID) | ORAL | Status: DC

## 2011-12-15 NOTE — Progress Notes (Signed)
Subjective:    Patient ID: Kari Rice, female    DOB: 11/04/1975, 36 y.o.   MRN: [id number]  HPI Has a bump on her butt (again) Is worried it could be MRSA- has had it before in a similar spot  Is sore  No fever - but felt a little chilled at home Has been there for 3 d Not draining   Keeps another cyst in L axilla   Some more redness over R leg - that is a separate problem- phlebitis  Wears supp stockings - has helped quite a bit  Will wait 3 mo - may consider vein procedure   Patient Active Problem List  Diagnosis  . TRICHOMONAL VAGINITIS  . UNSPECIFIED VAGINITIS AND VULVOVAGINITIS  . IRREGULAR MENSES  . NECK PAIN  . ELEVATED BP W/O HYPERTENSION  . CONTUSION, CHEST WALL  . Pain in joint, lower leg  . Venous insufficiency, peripheral  . Pedal edema  . Phlebitis and thrombophlebitis of the leg  . Venous insufficiency   Past Medical History  Diagnosis Date  . Varicose veins    Past Surgical History  Procedure Date  . Cholecystectomy    History  Substance Use Topics  . Smoking status: Never Smoker   . Smokeless tobacco: Never Used  . Alcohol Use: No   Family History  Problem Relation Age of Onset  . Arthritis Mother   . Heart disease Father    Allergies  Allergen Reactions  . Penicillins     REACTION: rash  . Sulfa Antibiotics Rash   Current Outpatient Prescriptions on File Prior to Visit  Medication Sig Dispense Refill  . NON FORMULARY Support hose to the waist for dx of venous insufficiency and edema 15-20 mm Hg          Review of Systems Review of Systems  Constitutional: Negative for fever, appetite change, fatigue and unexpected weight change.  Eyes: Negative for pain and visual disturbance.  Respiratory: Negative for cough and shortness of breath.   Cardiovascular: Negative for cp or palpitations   pos for varicose veins in R leg - and some redness at times  Gastrointestinal: Negative for nausea, diarrhea and constipation.  Genitourinary:  Negative for urgency and frequency.  Skin: Negative for pallor or rash  pos for cyst/ red area on skin  Neurological: Negative for weakness, light-headedness, numbness and headaches.  Hematological: Negative for adenopathy. Does not bruise/bleed easily.  Psychiatric/Behavioral: Negative for dysphoric mood. The patient is not nervous/anxious.         Objective:   Physical Exam  Constitutional: She appears well-developed and well-nourished.       obese and well appearing   HENT:  Head: Normocephalic and atraumatic.  Eyes: Conjunctivae and EOM are normal. Pupils are equal, round, and reactive to light. No scleral icterus.  Neck: Normal range of motion. Neck supple. No JVD present. No thyromegaly present.  Cardiovascular: Normal rate and regular rhythm.   Pulmonary/Chest: Effort normal and breath sounds normal.  Musculoskeletal: She exhibits edema.       R leg- baseline varicosities with slt erythema in upper inner thigh  Pt wears supp hose daily  Neurological: She is alert. She has normal reflexes.  Skin: Skin is warm and dry. No rash noted. There is erythema. No pallor.       1-2 cm firm red indurated area inner R buttock- is tender and no drainage Several small .5 cm or less soft cysts in axillae bilat  Psychiatric: She has a normal  mood and affect.          Assessment & Plan:

## 2011-12-15 NOTE — Patient Instructions (Addendum)
Use a warm compress or sitz bath on area on buttock - this will help it soften and drain if it needs to  If it does start to drain-warm compresses only  Clean the direct area with antibacterial soap and water  Keep covered  Keep clothes clean / use gauze if necessary Take the doxycycline as directed  If not improving in 2-3 days or worse or fever let me know  Keep in contact with the vascular office about your leg

## 2011-12-15 NOTE — Assessment & Plan Note (Signed)
On inner R buttock - in pt with suspected MRSA in the past  Is pcn/ sulfa all but resp to doxycycline well  Will take that for 10 days Warm compress  Did not I and D today- is too firm  Will update if worse or fever or not imp in several days

## 2011-12-20 NOTE — Assessment & Plan Note (Signed)
Symptoms somewhat imp with supp hose  Going to vein clinic  Will likely consider procedure to reduce venous problems in R leg

## 2011-12-21 ENCOUNTER — Telehealth: Payer: Self-pay | Admitting: Vascular Surgery

## 2011-12-21 ENCOUNTER — Telehealth: Payer: Self-pay

## 2011-12-21 DIAGNOSIS — M7989 Other specified soft tissue disorders: Secondary | ICD-10-CM

## 2011-12-21 DIAGNOSIS — M79604 Pain in right leg: Secondary | ICD-10-CM

## 2011-12-21 NOTE — Telephone Encounter (Signed)
I'm glad the area is draining and starting to look better , continue your antibiotic , and call at the end of antibiotic course to update me again - we may need to extend the course I saw in chart that she called the vascular office and got instructions as well

## 2011-12-21 NOTE — Telephone Encounter (Signed)
appt. w/ CSD/ Venous duplex 9/11      Kari Rice        Sent: Kari Rice December 21, 2011 12:01 PM    To: Kari Rice Tn [facility] Size         Small     Medium     Large     Extra Extra Kari Rice      12/21/2011 11:48 AM Telephone     MRN: [id number]      Description: 36 year old female     Provider: Erenest Rice, Kari Rice     Department: [facility]               Forgot to route my triage note...         Reason for Call        c/o swelling/ bruising right thigh            Diagnoses        Leg swelling   - Primary       729.81       Pain of right leg        729.5                     Call Documentation        Kari Rice, Kari Rice, Kari Rice  12/21/2011 11:58 AM  Signed    Phone call from pt. With c/o swelling and bruising right thigh.  States on 12/12/11 she experienced a discomfort "like she pulled a muscle when getting up from the couch."  States she felt a sharp pain at that time, and pain lasted about 24 hrs.  States there is swelling from knee to groin, and bruising at outside of right knee area.  Questioned about redness/inflammation; stated there is "redness behind knee and at shin area."  Denies swelling below the knee.  States today she has increased pain when she stands on it.  Discussed w/ Dr. Hart Rice.  Advised to schedule her for venous duplex of right leg, and to see Dr. Edilia Rice on Wednesday.                          Encounter MyChart Messages        No messages in this encounter                Created by        Kari Rice on 12/21/2011 11:48 AM                               Orders Placed This Encounter             Future Orders Expected By Expires            Lower Extremity Venous Duplex Right [VAS1060 Custom] 12/23/11 12/20/12                            Visit Pharmacy  [id number] - [place], [place] - [address] AT  [facility]                Contacts                  Type Contact Phone       12/21/2011 11:48 AM Phone (Incoming) Kari Rice, Kari Rice (Self) [phone] Kari Rice      12/21/2011 11:48 AM Telephone     MRN: [id number]      Description: 36 year old female     Provider: Erenest Rice, Kari Rice     Department: [facility]               Forgot to route my triage note...         Reason for Call        c/o swelling/ bruising right thigh            Diagnoses        Leg swelling   - Primary       729.81       Pain of right leg        729.5                     Call Documentation        Kari Rice, Kari Rice, Kari Rice  12/21/2011 11:58 AM  Signed    Phone call from pt. With c/o swelling and bruising right thigh.  States on 12/12/11 she experienced a discomfort "like she pulled a muscle when getting up from the couch."  States she felt a sharp pain at that time, and pain lasted about 24 hrs.  States there is swelling from knee to groin, and bruising at outside of right knee area.  Questioned about redness/inflammation; stated there is "redness behind knee and at shin area."  Denies swelling below the knee.  States today she has increased pain when she stands on it.  Discussed w/ Dr. Hart Rice.  Advised to schedule her for venous duplex of right leg, and to see Dr. Edilia Rice on Wednesday.                          Encounter MyChart Messages        No messages in this encounter                Created by        Kari Rice on 12/21/2011 11:48 AM                               Orders Placed This Encounter             Future Orders Expected By Expires            Lower Extremity Venous Duplex Right [VAS1060 Custom] 12/23/11 12/20/12  Visit Pharmacy        [id number] - [place], [place] - [address] AT [facility]                Contacts                  Type Contact Phone       12/21/2011 11:48 AM Phone (Incoming) Kari Rice, Kari Rice (Self) [phone] Kari Rice      12/21/2011 11:48 AM Telephone     MRN: [id number]      Description: 36 year old female     Provider: Erenest Rice, Kari Rice     Department: [facility]               Forgot to route my triage note...         Reason for Call        c/o swelling/ bruising right thigh            Diagnoses        Leg swelling   - Primary       729.81       Pain of right leg        729.5                     Call Documentation        Kari Rice, Kari Rice, Kari Rice  12/21/2011 11:58 AM  Signed    Phone call from pt. With c/o swelling and bruising right thigh.  States on 12/12/11 she experienced a discomfort "like she pulled a muscle when getting up from the couch."  States she felt a sharp pain at that time, and pain lasted about 24 hrs.  States there is swelling from knee to groin, and bruising at outside of right knee area.  Questioned about redness/inflammation; stated there is "redness behind knee and at shin area."  Denies swelling below the knee.  States today she has increased pain when she stands on it.  Discussed w/ Dr. Hart Rice.  Advised to schedule her for venous duplex of right leg, and to see Dr. Edilia Rice on Wednesday.                                                               Kari Rice, Kari Rice, Kari Rice - appt. w/ CSD/ Venous duplex 9/11 More Detail >>      appt. w/ CSD/ Venous duplex 9/11      Kari Rice        Sent: Kari Rice December 21, 2011 12:01 PM    To: P [facility] [employer] Size         Small     Medium     Large     Extra Extra Large                         Kari Rice  12/21/2011 11:48 AM Telephone     MRN: [id number]      Description: 36 year old female     Provider: Erenest Rice, Kari Rice      Department: [facility]               Forgot to route my triage note...         Reason for Call        c/o swelling/ bruising right thigh            Diagnoses        Leg swelling   - Primary       729.81       Pain of right leg        729.5                     Call Documentation        Kari Rice, Kari Rice, Kari Rice  12/21/2011 11:58 AM  Signed    Phone call from pt. With c/o swelling and bruising right thigh.  States on 12/12/11 she experienced a discomfort "like she pulled a muscle when getting up from the couch."  States she felt a sharp pain at that time, and pain lasted about 24 hrs.  States there is swelling from knee to groin, and bruising at outside of right knee area.  Questioned about redness/inflammation; stated there is "redness behind knee and at shin area."  Denies swelling below the knee.  States today she has increased pain when she stands on it.  Discussed w/ Dr. Hart Rice.  Advised to schedule her for venous duplex of right leg, and to see Dr. Edilia Rice on Wednesday.                          Encounter MyChart Messages        No messages in this encounter                Created by        Kari Rice on 12/21/2011 11:48 AM                               Orders Placed This Encounter             Future Orders Expected By Expires            Lower Extremity Venous Duplex Right [VAS1060 Custom] 12/23/11 12/20/12                            Visit Pharmacy        [id number] - [place], [place] - [address] [address] AT [facility]                Contacts                  Type Contact Phone       12/21/2011 11:48 AM Phone (Incoming) Kari Rice, Kari Rice (Self) [phone] Kari Rice      12/21/2011 11:48 AM Telephone     MRN: [id number]  Description: 36 year old female    Provider: Pullins, Kari Rice, Kari Rice    Department: [facility]    Diagnoses     Leg swelling   - Primary     729.81     Pain of right leg      729.5                   Call Documentation    Kari Rice, Kari Rice, Kari Rice  12/21/2011 11:58 AM  Signed  Phone call from pt. With c/o swelling and bruising right thigh.  States on 12/12/11 she experienced a discomfort "like she pulled a muscle when getting up from the couch."  States she felt a sharp pain at that time, and pain lasted about 24 hrs.  States there is swelling from knee to groin, and bruising at outside of right knee area.  Questioned about redness/inflammation; stated there is "redness behind knee and at shin area."  Denies swelling below the knee.  States today she has increased pain when she stands on it.  Discussed w/ Dr. Hart Rice.  Advised to schedule her for venous duplex of right leg, and to see Dr. Edilia Rice on Wednesday.    Kari Rice on 12/21/2011 11:48 AM   Lower Extremity Venous Duplex Right [VAS1060 Custom] 12/23/11 12/20/12     12/21/2011 11:48 AM Phone (Incoming) Kari Rice, Kari Rice (Self) [phone] (H)

## 2011-12-21 NOTE — Telephone Encounter (Signed)
Pt's buttock is still draining but does appear better than when seen 12/15/11; pt's right leg swelling is worse and pt cannot stand more than 5 mins. Advised would let Dr Milinda Antis know; advised pt to contact her vascular doctor also.Please advise.

## 2011-12-21 NOTE — Telephone Encounter (Signed)
Spoke to patient instructed her with instructions from below. She stated that she will keep [place] updated.

## 2011-12-21 NOTE — Telephone Encounter (Signed)
Phone call from pt. With c/o swelling and bruising right thigh.  States on 12/12/11 she experienced a discomfort "like she pulled a muscle when getting up from the couch."  States she felt a sharp pain at that time, and pain lasted about 24 hrs.  States there is swelling from knee to groin, and bruising at outside of right knee area.  Questioned about redness/inflammation; stated there is "redness behind knee and at shin area."  Denies swelling below the knee.  States today she has increased pain when she stands on it.  Discussed w/ Dr. Hart [place].  Advised to schedule her for venous duplex of right leg, and to see Dr. Edilia Bo on Wednesday.

## 2011-12-22 ENCOUNTER — Encounter: Payer: Self-pay | Admitting: Vascular Surgery

## 2011-12-23 ENCOUNTER — Other Ambulatory Visit: Payer: Self-pay | Admitting: Radiology

## 2011-12-23 ENCOUNTER — Ambulatory Visit (INDEPENDENT_AMBULATORY_CARE_PROVIDER_SITE_OTHER): Payer: 59 | Admitting: Vascular Surgery

## 2011-12-23 ENCOUNTER — Encounter (HOSPITAL_COMMUNITY): Payer: Self-pay | Admitting: Pharmacy Technician

## 2011-12-23 ENCOUNTER — Encounter: Payer: Self-pay | Admitting: Vascular Surgery

## 2011-12-23 ENCOUNTER — Telehealth: Payer: Self-pay | Admitting: Family Medicine

## 2011-12-23 ENCOUNTER — Encounter (INDEPENDENT_AMBULATORY_CARE_PROVIDER_SITE_OTHER): Payer: 59 | Admitting: *Deleted

## 2011-12-23 ENCOUNTER — Other Ambulatory Visit: Payer: Self-pay

## 2011-12-23 VITALS — BP 116/77 | HR 82 | Resp 16 | Ht 64.0 in | Wt 222.3 lb

## 2011-12-23 DIAGNOSIS — M79609 Pain in unspecified limb: Secondary | ICD-10-CM

## 2011-12-23 DIAGNOSIS — I824Y9 Acute embolism and thrombosis of unspecified deep veins of unspecified proximal lower extremity: Secondary | ICD-10-CM

## 2011-12-23 DIAGNOSIS — I872 Venous insufficiency (chronic) (peripheral): Secondary | ICD-10-CM

## 2011-12-23 DIAGNOSIS — M7989 Other specified soft tissue disorders: Secondary | ICD-10-CM

## 2011-12-23 DIAGNOSIS — I82419 Acute embolism and thrombosis of unspecified femoral vein: Secondary | ICD-10-CM

## 2011-12-23 DIAGNOSIS — M79604 Pain in right leg: Secondary | ICD-10-CM

## 2011-12-23 MED ORDER — WARFARIN SODIUM 5 MG PO TABS: 7.5000 mg | ORAL_TABLET | Freq: Every day | ORAL | Status: DC

## 2011-12-23 MED ORDER — ENOXAPARIN SODIUM 150 MG/ML ~~LOC~~ SOLN: 100.0000 mg | Freq: Two times a day (BID) | SUBCUTANEOUS | Status: DC

## 2011-12-23 NOTE — Assessment & Plan Note (Signed)
This patient has been diagnosed with a new deep venous thrombosis involving the right common femoral vein and femoral vein. There is also involvement of the right greater saphenous vein. Given that she is young I think it would be worth pursuing thrombolyzes and we will order this for tomorrow. She will be started on Lovenox, 100 mg subcutaneous twice a day, today. I have written her a prescription to start Coumadin 7.5 mg daily starting tomorrow. I have discussed the case with Dr. Milinda Antis who will follow her pro time. I'll arrange for a follow up venous duplex scan in 6 months and plan on seeing her back at that time pending the results of her thrombolysis.

## 2011-12-23 NOTE — Progress Notes (Signed)
Vascular and Vein Specialist of [facility]  Patient name: Kari Rice MRN: [id number] DOB: Feb 10, 1976 Sex: female  REASON FOR VISIT: swelling of the right lower extremity.  HPI: Kari Rice is a 36 y.o. female who I saw with evidence of chronic venous insufficiency of the right lower extremity on 11/25/2011. She had a previous venous duplex scan which showed no evidence of DVT on 11/06/2011. She had a venous duplex scan in our office that day which showed no evidence of DVT but did show some evidence of chronic venous insufficiency. I did obtain a CT scan of the pelvis to rule out at any type of obstruction or other source of the right lower extremity swelling and this likewise was unremarkable. 4 days ago she began noticing increasing swelling in the right lower extremity. She called the office and arrangements were made to bring her in for a venous duplex scan. He states that the swelling in her right leg has persisted. She's had no significant shortness of breath or chest pain. She denies pleuritic chest pain.  Past Medical History  Diagnosis Date  . Varicose veins     Family History  Problem Relation Age of Onset  . Arthritis Mother   . Heart disease Father     SOCIAL HISTORY: History  Substance Use Topics  . Smoking status: Never Smoker   . Smokeless tobacco: Never Used  . Alcohol Use: No    Allergies  Allergen Reactions  . Penicillins     REACTION: rash  . Sulfa Antibiotics Rash    Current Outpatient Prescriptions  Medication Sig Dispense Refill  . doxycycline (VIBRA-TABS) 100 MG tablet Take 1 tablet (100 mg total) by mouth 2 (two) times daily.  20 tablet  0  . NON FORMULARY Support hose to the waist for dx of venous insufficiency and edema 15-20 mm Hg      . enoxaparin (LOVENOX) 150 MG/ML injection Inject 0.67 mLs (100 mg total) into the skin every 12 (twelve) hours.  10 Syringe  1  . warfarin (COUMADIN) 5 MG tablet Take 1.5 tablets (7.5 mg total) by mouth daily.   30 tablet  3    REVIEW OF SYSTEMS: Arly.Keller ] denotes positive finding; [  ] denotes negative finding  CARDIOVASCULAR:  [ ]  chest pain   [ ]  chest pressure   [ ]  palpitations   [ ]  orthopnea   [ ]  dyspnea on exertion   [ ]  claudication   [ ]  rest pain   [ ]  DVT   [ ]  phlebitis PULMONARY:   [ ]  productive cough   [ ]  asthma   [ ]  wheezing NEUROLOGIC:   [ ]  weakness  [ ]  paresthesias  [ ]  aphasia  [ ]  amaurosis  [ ]  dizziness HEMATOLOGIC:   [ ]  bleeding problems   [ ]  clotting disorders MUSCULOSKELETAL:  [ ]  joint pain   [ ]  joint swelling Arly.Keller ] leg swelling right leg GASTROINTESTINAL: [ ]   blood in stool  [ ]   hematemesis GENITOURINARY:  [ ]   dysuria  [ ]   hematuria PSYCHIATRIC:  [ ]  history of major depression INTEGUMENTARY:  [ ]  rashes  [ ]  ulcers CONSTITUTIONAL:  [ ]  fever   [ ]  chills  PHYSICAL EXAM: Filed Vitals:   12/23/11 1114  BP: 116/77  Pulse: 82  Resp: 16  Height: 5\' 4"  (1.626 m)  Weight: 222 lb 4.8 oz (100.835 kg)  SpO2: 100%   Body mass index is 38.16 kg/(m^2). GENERAL:  The patient is a well-nourished female, in no acute distress. The vital signs are documented above. CARDIOVASCULAR: There is a regular rate and rhythm. She has significant right lower extremity swelling extending up to her groin. PULMONARY: There is good air exchange bilaterally without wheezing or rales. ABDOMEN: Soft and non-tender with normal pitched bowel sounds.  MUSCULOSKELETAL: There are no major deformities or cyanosis. NEUROLOGIC: No focal weakness or paresthesias are detected. SKIN: she has evidence of phlebitis along the anterolateral aspect of her right leg.PSYCHIATRIC: The patient has a normal affect.  DATA:  Venous duplex scan shows evidence of partially occlusive thrombus in the right common femoral vein with occlusive thrombus in the right femoral vein and also occlusive thrombus in the right greater saphenous vein.  MEDICAL ISSUES:  Deep venous thrombosis of femoral vein This patient has  been diagnosed with a new deep venous thrombosis involving the right common femoral vein and femoral vein. There is also involvement of the right greater saphenous vein. Given that she is young I think it would be worth pursuing thrombolyzes and we will order this for tomorrow. She will be started on Lovenox, 100 mg subcutaneous twice a day, today. I have written her a prescription to start Coumadin 7.5 mg daily starting tomorrow. I have discussed the case with Dr. Milinda Antis who will follow her pro time. I'll arrange for a follow up venous duplex scan in 6 months and plan on seeing her back at that time pending the results of her thrombolysis.   Irania Durell S [facility] [place] Beeper: [phone]

## 2011-12-23 NOTE — Telephone Encounter (Signed)
Pt calling to schedule first INR;[name] will schedule now.

## 2011-12-23 NOTE — Telephone Encounter (Signed)
Spoke with Dr Edilia Bo- pt has a new DVT in R leg  Will begin coumadin 7.5 mg along with lovenox (for at least 5-6d) Goal INR will be 2-3  Pt will be calling for appt for her first protime this week  She is a candidate for thrombolysis and vascular will pursue this also  I will alert Terri that pt will be calling for a first INR for DVT

## 2011-12-24 ENCOUNTER — Encounter (HOSPITAL_COMMUNITY): Payer: Self-pay

## 2011-12-24 ENCOUNTER — Inpatient Hospital Stay (HOSPITAL_COMMUNITY)
Admission: RE | Admit: 2011-12-24 | Discharge: 2011-12-30 | DRG: 238 | Disposition: A | Discharge status: Home / Self Care | Payer: 59 | Source: Ambulatory Visit | Attending: Vascular Surgery | Admitting: Vascular Surgery

## 2011-12-24 VITALS — BP 109/70 | HR 66 | Temp 98.3°F | Resp 18 | Ht 64.0 in | Wt 222.0 lb

## 2011-12-24 DIAGNOSIS — I824Y9 Acute embolism and thrombosis of unspecified deep veins of unspecified proximal lower extremity: Principal | ICD-10-CM | POA: Diagnosis present

## 2011-12-24 DIAGNOSIS — I872 Venous insufficiency (chronic) (peripheral): Secondary | ICD-10-CM | POA: Diagnosis present

## 2011-12-24 DIAGNOSIS — I871 Compression of vein: Secondary | ICD-10-CM | POA: Diagnosis present

## 2011-12-24 DIAGNOSIS — I80299 Phlebitis and thrombophlebitis of other deep vessels of unspecified lower extremity: Secondary | ICD-10-CM

## 2011-12-24 DIAGNOSIS — M7989 Other specified soft tissue disorders: Secondary | ICD-10-CM

## 2011-12-24 LAB — BASIC METABOLIC PANEL WITH GFR
BUN: 11 mg/dL (ref 6–23)
CO2: 23 meq/L (ref 19–32)
Calcium: 9.3 mg/dL (ref 8.4–10.5)
Chloride: 105 meq/L (ref 96–112)
Creatinine, Ser: 0.57 mg/dL (ref 0.50–1.10)
GFR calc Af Amer: >90 mL/min (ref >90)
GFR calc non Af Amer: >90 mL/min (ref >90)
Glucose, Bld: 88 mg/dL (ref 70–99)
Potassium: 3.9 meq/L (ref 3.5–5.1)
Sodium: 140 meq/L (ref 135–145)

## 2011-12-24 LAB — CBC WITH DIFFERENTIAL/PLATELET
Basophils Absolute: 0 K/uL (ref 0.0–0.1)
Basophils Relative: 0 % (ref 0–1)
Eosinophils Absolute: 0.4 K/uL (ref 0.0–0.7)
Eosinophils Relative: 5 % (ref 0–5)
HCT: 37.7 % (ref 36.0–46.0)
Hemoglobin: 12.6 g/dL (ref 12.0–15.0)
Lymphocytes Relative: 28 % (ref 12–46)
Lymphs Abs: 2.5 K/uL (ref 0.7–4.0)
MCH: 29.2 pg (ref 26.0–34.0)
MCHC: 33.4 g/dL (ref 30.0–36.0)
MCV: 87.3 fL (ref 78.0–100.0)
Monocytes Absolute: 0.6 K/uL (ref 0.1–1.0)
Monocytes Relative: 7 % (ref 3–12)
Neutro Abs: 5.2 K/uL (ref 1.7–7.7)
Neutrophils Relative %: 60 % (ref 43–77)
Platelets: 279 K/uL (ref 150–400)
RBC: 4.32 MIL/uL (ref 3.87–5.11)
RDW: 12.3 % (ref 11.5–15.5)
WBC: 8.8 K/uL (ref 4.0–10.5)

## 2011-12-24 LAB — PROTIME-INR
INR: 1.05 (ref 0.00–1.49)
INR: 1.09 (ref 0.00–1.49)
INR: 1.19 (ref 0.00–1.49)
Prothrombin Time: 13.9 s (ref 11.6–15.2)
Prothrombin Time: 14.3 seconds (ref 11.6–15.2)
Prothrombin Time: 15.4 seconds — ABNORMAL HIGH (ref 11.6–15.2)

## 2011-12-24 LAB — CBC
Hemoglobin: 12 g/dL (ref 12.0–15.0)
MCH: 28.9 pg (ref 26.0–34.0)
MCHC: 33.1 g/dL (ref 30.0–36.0)
Platelets: 189 10*3/uL (ref 150–400)
Platelets: 218 10*3/uL (ref 150–400)
RBC: 3.85 MIL/uL — ABNORMAL LOW (ref 3.87–5.11)
RDW: 12.3 % (ref 11.5–15.5)
RDW: 12.1 % (ref 11.5–15.5)
WBC: 9.2 10*3/uL (ref 4.0–10.5)

## 2011-12-24 LAB — APTT
aPTT: 32 seconds (ref 24–37)
aPTT: 34 seconds (ref 24–37)
aPTT: 42 seconds — ABNORMAL HIGH (ref 24–37)

## 2011-12-24 LAB — MRSA PCR SCREENING: MRSA by PCR: NEGATIVE

## 2011-12-24 MED ORDER — HEPARIN (PORCINE) IN NACL 100-0.45 UNIT/ML-% IJ SOLN
500.0000 [IU]/h | INTRAMUSCULAR | Status: DC
  Administered 2011-12-24: 500 [IU]/h via INTRAVENOUS
  Filled 2011-12-24: qty 250

## 2011-12-24 MED ORDER — SODIUM CHLORIDE 0.9 % IV SOLN
250.0000 mL | INTRAVENOUS | Status: DC | PRN
  Administered 2011-12-24: 250 mL via INTRAVENOUS

## 2011-12-24 MED ORDER — SODIUM CHLORIDE 0.9 % IJ SOLN
3.0000 mL | Freq: Two times a day (BID) | INTRAMUSCULAR | Status: DC
  Administered 2011-12-24: 3 mL via INTRAVENOUS

## 2011-12-24 MED ORDER — ONDANSETRON HCL 4 MG/2ML IJ SOLN
4.0000 mg | Freq: Four times a day (QID) | INTRAMUSCULAR | Status: DC | PRN
  Administered 2011-12-24 – 2011-12-25 (×2): 4 mg via INTRAVENOUS
  Filled 2011-12-24 (×2): qty 2

## 2011-12-24 MED ORDER — MORPHINE SULFATE 4 MG/ML IJ SOLN
4.0000 mg | INTRAMUSCULAR | Status: DC | PRN
  Administered 2011-12-25 (×3): 4 mg via INTRAVENOUS
  Filled 2011-12-24 (×3): qty 1

## 2011-12-24 MED ORDER — HEPARIN SODIUM (PORCINE) 1000 UNIT/ML IJ SOLN
INTRAMUSCULAR | Status: AC
  Filled 2011-12-24: qty 1

## 2011-12-24 MED ORDER — HEPARIN (PORCINE) IN NACL 100-0.45 UNIT/ML-% IJ SOLN
1000.0000 [IU]/h | INTRAMUSCULAR | Status: DC
  Administered 2011-12-24: 800 [IU]/h via INTRAVENOUS
  Administered 2011-12-25: 1000 [IU]/h via INTRAVENOUS
  Filled 2011-12-24 (×3): qty 250

## 2011-12-24 MED ORDER — HEPARIN (PORCINE) IN NACL 2-0.9 UNIT/ML-% IJ SOLN: INTRAMUSCULAR | Status: DC

## 2011-12-24 MED ORDER — TENECTEPLASE 50 MG IV KIT
0.5000 mg/h | PACK | INTRAVENOUS | Status: DC
  Administered 2011-12-24 – 2011-12-25 (×5): 0.5 mg/h via INTRAVENOUS
  Filled 2011-12-24 (×5): qty 0.5

## 2011-12-24 MED ORDER — SODIUM CHLORIDE 0.9 % IV SOLN
INTRAVENOUS | Status: DC
  Administered 2011-12-24: 18:00:00 via INTRAVENOUS

## 2011-12-24 MED ORDER — MIDAZOLAM HCL 2 MG/2ML IJ SOLN
INTRAMUSCULAR | Status: AC
  Filled 2011-12-24: qty 4

## 2011-12-24 MED ORDER — MIDAZOLAM HCL 5 MG/5ML IJ SOLN
INTRAMUSCULAR | Status: AC | PRN
  Administered 2011-12-24 (×4): 1 mg via INTRAVENOUS

## 2011-12-24 MED ORDER — HEPARIN SODIUM (PORCINE) 1000 UNIT/ML IJ SOLN
INTRAMUSCULAR | Status: AC | PRN
  Administered 2011-12-24: 3000 [IU] via INTRAVENOUS

## 2011-12-24 MED ORDER — SODIUM CHLORIDE 0.9 % IV SOLN: Freq: Once | INTRAVENOUS | Status: DC

## 2011-12-24 MED ORDER — SODIUM CHLORIDE 0.9 % IJ SOLN: 3.0000 mL | INTRAMUSCULAR | Status: DC | PRN

## 2011-12-24 MED ORDER — MIDAZOLAM HCL 2 MG/2ML IJ SOLN
1.0000 mg | INTRAMUSCULAR | Status: DC | PRN
  Administered 2011-12-24: 1 mg via INTRAVENOUS
  Filled 2011-12-24: qty 2

## 2011-12-24 MED ORDER — ALTEPLASE 30 MG/30 ML FOR INTERV. RAD: 30.0000 mg | INTRA_ARTERIAL | Status: DC

## 2011-12-24 MED ORDER — FENTANYL CITRATE 0.05 MG/ML IJ SOLN
INTRAMUSCULAR | Status: AC
  Filled 2011-12-24: qty 4

## 2011-12-24 MED ORDER — IODIXANOL 320 MG/ML IV SOLN: 100.0000 mL | Freq: Once | INTRAVENOUS | Status: AC | PRN

## 2011-12-24 MED ORDER — FENTANYL CITRATE 0.05 MG/ML IJ SOLN
INTRAMUSCULAR | Status: AC | PRN
  Administered 2011-12-24 (×4): 50 ug via INTRAVENOUS

## 2011-12-24 NOTE — Progress Notes (Addendum)
ANTICOAGULATION CONSULT NOTE - Initial Consult  Pharmacy Consult for heparin Indication:   Allergies  Allergen Reactions  . Penicillins Rash  . Sulfa Antibiotics Rash    Patient Measurements: Height: 5\' 4"  (162.6 cm) Weight: 222 lb (100.699 kg) IBW/kg (Calculated) : 54.7  Heparin Dosing Weight: 78 kg  Vital Signs: Temp: 98.2 F (36.8 C) (09/12 0640) Temp src: Oral (09/12 0640) BP: 110/84 mmHg (09/12 1005) Pulse Rate: 87  (09/12 0845)  Labs:  Basename 12/24/11 0645  HGB 12.6  HCT 37.7  PLT 279  APTT 32  LABPROT 13.9  INR 1.05  HEPARINUNFRC --  CREATININE 0.57  CKTOTAL --  CKMB --  TROPONINI --    Estimated Creatinine Clearance: 110 ml/min (by C-G formula based on Cr of 0.57).   Medical History: Past Medical History  Diagnosis Date  . Varicose veins     Medications:  Prescriptions prior to admission  Medication Sig Dispense Refill  . doxycycline (VIBRA-TABS) 100 MG tablet Take 100 mg by mouth 2 (two) times daily.      Marland Kitchen enoxaparin (LOVENOX) 150 MG/ML injection Inject 100 mg into the skin every 12 (twelve) hours.      . NON FORMULARY Support hose to the waist for dx of venous insufficiency and edema 15-20 mm Hg      . warfarin (COUMADIN) 5 MG tablet Take 7.5 mg by mouth daily.        Assessment: 36 yo F s/p RLE venogram with partial mechanical thrombectomy and initiation of overnight venous thrombolysis.  Currently receiving heparin at 500 units/hr (infusing into R femoral sheath) along with tenecteplace infusion at 0.5 mg/hr. S/p 3000 unit heparin bolus at 0855.  Spoke with Dr Archer Asa and received orders to continue heparin at 500 units/hr until 1st heparin level at 1600 today, then may increase heparin to maintain goal of 0.2-0.5 but NOT TO EXCEED heparin rate of 800 units/hr.  Goal of Therapy:  Heparin level 0.2-0.5 Monitor platelets by anticoagulation protocol: Yes   Plan:  - Continue heparin at 500 units/hr, as above - f/u 1600 heparin level  and adjust accordinly  Sherley Mckenney L. Illene Bolus, PharmD, BCPS Clinical Pharmacist Pager: [phone] Pharmacy: [phone] 12/24/2011 11:46 AM

## 2011-12-24 NOTE — H&P (Signed)
Agree with above.  Will approach by popliteal venous access and evaluate for volume of thrombus, and underlying stenosis.  Patient will likely require overnight lysis and possibly PTA/stenting.  Signed,  Sterling Big, MD Vascular & Interventional Radiologist [facility]iology

## 2011-12-24 NOTE — H&P (Signed)
Kari Rice is an 36 y.o. female.   Chief Complaint: pt of Dr Edilia Bo; seen in his office yesterday for rt leg swelling and redness and pain. Was seen by Dr Edilia Bo 8/14 with chronic venous insufficiency; doppler of rt low leg neg. Revisit 9/11 doppler pos for femoral vein dvt Scheduled for rt leg venogram with probable thrombolysis and possible angioplasty/stent placement HPI: varicose veins; nonsmoker  Past Medical History  Diagnosis Date  . Varicose veins     Past Surgical History  Procedure Date  . Cholecystectomy     Family History  Problem Relation Age of Onset  . Arthritis Mother   . Heart disease Father    Social History:  reports that she has never smoked. She has never used smokeless tobacco. She reports that she does not drink alcohol or use illicit drugs.  Allergies:  Allergies  Allergen Reactions  . Penicillins Rash  . Sulfa Antibiotics Rash     (Not in a hospital admission)  Results for orders placed during the hospital encounter of 12/24/11 (from the past 48 hour(s))  FIBRINOGEN     Status: Abnormal   Collection Time   12/24/11  6:45 AM      Component Value Range Comment   Fibrinogen 558 (*) 204 - 475 mg/dL   APTT     Status: Normal   Collection Time   12/24/11  6:45 AM      Component Value Range Comment   aPTT 32  24 - 37 seconds   BASIC METABOLIC PANEL     Status: Normal   Collection Time   12/24/11  6:45 AM      Component Value Range Comment   Sodium 140  135 - 145 mEq/L    Potassium 3.9  3.5 - 5.1 mEq/L    Chloride 105  96 - 112 mEq/L    CO2 23  19 - 32 mEq/L    Glucose, Bld 88  70 - 99 mg/dL    BUN 11  6 - 23 mg/dL    Creatinine, Ser 7.82  0.50 - 1.10 mg/dL    Calcium 9.3  8.4 - 95.6 mg/dL    GFR calc non Af Amer >90  >90 mL/min    GFR calc Af Amer >90  >90 mL/min   CBC WITH DIFFERENTIAL     Status: Normal   Collection Time   12/24/11  6:45 AM      Component Value Range Comment   WBC 8.8  4.0 - 10.5 K/uL    RBC 4.32  3.87 - 5.11  MIL/uL    Hemoglobin 12.6  12.0 - 15.0 g/dL    HCT 21.3  08.6 - 57.8 %    MCV 87.3  78.0 - 100.0 fL    MCH 29.2  26.0 - 34.0 pg    MCHC 33.4  30.0 - 36.0 g/dL    RDW 46.9  62.9 - 52.8 %    Platelets 279  150 - 400 K/uL    Neutrophils Relative 60  43 - 77 %    Neutro Abs 5.2  1.7 - 7.7 K/uL    Lymphocytes Relative 28  12 - 46 %    Lymphs Abs 2.5  0.7 - 4.0 K/uL    Monocytes Relative 7  3 - 12 %    Monocytes Absolute 0.6  0.1 - 1.0 K/uL    Eosinophils Relative 5  0 - 5 %    Eosinophils Absolute 0.4  0.0 - 0.7 K/uL  Basophils Relative 0  0 - 1 %    Basophils Absolute 0.0  0.0 - 0.1 K/uL   PROTIME-INR     Status: Normal   Collection Time   12/24/11  6:45 AM      Component Value Range Comment   Prothrombin Time 13.9  11.6 - 15.2 seconds    INR 1.05  0.00 - 1.49    No results found.  Review of Systems  Constitutional: Negative for fever and weight loss.  Respiratory: Negative for shortness of breath.   Cardiovascular: Negative for chest pain.  Gastrointestinal: Negative for nausea and vomiting.  Musculoskeletal: Positive for joint pain.       Rt leg pain; swelling  Neurological: Negative for weakness and headaches.    Blood pressure 122/85, pulse 83, temperature 98.2 F (36.8 C), temperature source Oral, resp. rate 18, height 5\' 4"  (1.626 m), weight 222 lb (100.699 kg), last menstrual period 11/25/2011, SpO2 96.00%. Physical Exam  Constitutional: She is oriented to person, place, and time. She appears well-developed and well-nourished.  Cardiovascular: Normal rate, regular rhythm and normal heart sounds.   No murmur heard. Respiratory: Effort normal and breath sounds normal. She has no wheezes.  GI: Soft. Bowel sounds are normal. There is no tenderness.  Musculoskeletal: Normal range of motion.       Rt leg swelling; redness; painful  Neurological: She is alert and oriented to person, place, and time. Coordination normal.  Psychiatric: She has a normal mood and affect. Her  behavior is normal. Judgment and thought content normal.     Assessment/Plan Chronic venous insufficiency New onset Rt low leg dvt 12/23/11 Redness; swelling; painful Scheduled now for venogram; thrombolysis; poss pta/stent Pt aware of procedure benefits and risks and agreeable to proceed. Consent signed and in chart Pt aware may be admitted.  Gay Rape A 12/24/2011, 7:33 AM

## 2011-12-24 NOTE — Progress Notes (Addendum)
ANTICOAGULATION CONSULT NOTE - Follow Up Consult   Pharmacy Consult:  Heparin Indication:  Anticoagulation post thrombectomy/thrombolysis of DVT   HL = <0.1 (goal 0.2 - 0.5 units/mL) Heparin weight = 78 kg   Assessment: 36 yo F s/p RLE venogram with partial mechanical thrombectomy and initiation of overnight venous thrombolysis.  Currently receiving heparin at 500 units/hr (infusing into right femoral sheath) along with tenecteplace infusion at 0.5 mg/hr.  Heparin level undetectable, CBC stable, fibrinogen WNL, no bleeding reported.  Per Dr. Archer Asa, Pharmacy may adjust heparin rate but not to exceed 800 units/hr.     Goal of Therapy:  Heparin level 0.2-0.5 units/ml Monitor platelets by anticoagulation protocol: Yes    Plan:  - Increase heparin gtt to the max rate of 800 units/hr (~10 units/kg), no bolus - F/U heparin level at 0400 (omit 2200 HL because it will not be at steady-state and expect HL to be on the low end of therapeutic level if not subtherapeutic). - Spoke to RN, Pharmacy will be notified immediately for any signs/symptoms of bleeding.     Shayli Altemose D. Laney Potash, PharmD, BCPS Pager:  [phone] 12/24/2011, 5:47 PM

## 2011-12-24 NOTE — H&P (Signed)
Pt with complaints of nausea.  Pt given Zofran 4mg  IV for nausea at 1517.  Nausea relieved per pt at 1600.  Will continue to monitor.  Spoke with Dr Archer Asa and verbal order given to add Normal Saline carrier to sheath and infusion catheter. Pt noted to have pulses above and below in affected extremity. Will continue to monitor.

## 2011-12-24 NOTE — Procedures (Signed)
Interventional Radiology Procedure Note  Procedure:  1. RLE venogram 2. Partial mechanical thrombectomy 3. Initiation of overnight venous thrombolysis Complications: None Recommendations:  - [place] overnight per protocol - Q6 hr CBC, fibrinogen, heparin levels - Call IR for fibrinogen < 150 - Will recheck in am, pt will need venous PTA and likely stenting   Signed,  Sterling Big, MD Vascular & Interventional Radiologist [facility]

## 2011-12-24 NOTE — Progress Notes (Signed)
VASCULAR PROGRESS NOTE SUBJECTIVE: Comfortable  PHYSICAL EXAM: Filed Vitals:   12/24/11 1300 12/24/11 1400 12/24/11 1500 12/24/11 1600  BP: 114/78 107/72 109/67 110/77  Pulse: 80 77 75 73  Temp:    98.9 F (37.2 C)  TempSrc:    Oral  Resp: 17 18 19 21   Height:      Weight:      SpO2: 99% 98% 95% 95%   Right LE swelling about the same.  LABS: Lab Results  Component Value Date   WBC 8.8 12/24/2011   HGB 12.6 12/24/2011   HCT 37.7 12/24/2011   MCV 87.3 12/24/2011   PLT 279 12/24/2011   Lab Results  Component Value Date   CREATININE 0.57 12/24/2011   Lab Results  Component Value Date   INR 1.05 12/24/2011   CBG (last 3)   Basename 12/24/11 1205  GLUCAP 79     ASSESSMENT/PLAN: 1.Pt was seen yesterday in the office with an acute DVT. She was admitted today for thrombolysis by IR. Her catheter is in place with thrombolytics infusing. For follow up venogram tomorrow.   Waverly Ferrari, MD, FACS Beeper: [phone] 12/24/2011

## 2011-12-25 ENCOUNTER — Ambulatory Visit: Payer: 59

## 2011-12-25 ENCOUNTER — Encounter (HOSPITAL_COMMUNITY): Payer: Self-pay

## 2011-12-25 ENCOUNTER — Inpatient Hospital Stay (HOSPITAL_COMMUNITY): Payer: 59

## 2011-12-25 LAB — HEPARIN LEVEL (UNFRACTIONATED)
Heparin Unfractionated: <0.1 IU/mL — ABNORMAL LOW (ref 0.30–0.70)
Heparin Unfractionated: <0.1 IU/mL — ABNORMAL LOW (ref 0.30–0.70)

## 2011-12-25 LAB — CBC
HCT: 35.3 % — ABNORMAL LOW (ref 36.0–46.0)
Hemoglobin: 11.6 g/dL — ABNORMAL LOW (ref 12.0–15.0)
MCV: 87.2 fL (ref 78.0–100.0)
RBC: 4.05 MIL/uL (ref 3.87–5.11)
RDW: 12.2 % (ref 11.5–15.5)
WBC: 9.5 10*3/uL (ref 4.0–10.5)

## 2011-12-25 MED ORDER — MIDAZOLAM HCL 5 MG/5ML IJ SOLN
INTRAMUSCULAR | Status: AC | PRN
  Administered 2011-12-25: 1 mg via INTRAVENOUS
  Administered 2011-12-25 (×2): 0.5 mg via INTRAVENOUS
  Administered 2011-12-25: 1 mg via INTRAVENOUS

## 2011-12-25 MED ORDER — COUMADIN BOOK
Freq: Once | Status: AC
  Administered 2011-12-25: 13:00:00
  Filled 2011-12-25: qty 1

## 2011-12-25 MED ORDER — WARFARIN SODIUM 5 MG PO TABS: 5.0000 mg | ORAL_TABLET | Freq: Every day | ORAL | Status: DC

## 2011-12-25 MED ORDER — WARFARIN SODIUM 10 MG PO TABS
10.0000 mg | ORAL_TABLET | Freq: Once | ORAL | Status: AC
  Administered 2011-12-25: 10 mg via ORAL
  Filled 2011-12-25: qty 1

## 2011-12-25 MED ORDER — HEPARIN (PORCINE) IN NACL 100-0.45 UNIT/ML-% IJ SOLN
1450.0000 [IU]/h | INTRAMUSCULAR | Status: DC
  Administered 2011-12-26: 1450 [IU]/h via INTRAVENOUS
  Filled 2011-12-25 (×3): qty 250

## 2011-12-25 MED ORDER — IOHEXOL 300 MG/ML  SOLN
150.0000 mL | Freq: Once | INTRAMUSCULAR | Status: AC | PRN
  Administered 2011-12-25: 80 mL via INTRAVENOUS

## 2011-12-25 MED ORDER — WARFARIN VIDEO
Freq: Once | Status: AC
  Administered 2011-12-25: 13:00:00

## 2011-12-25 MED ORDER — WARFARIN - PHARMACIST DOSING INPATIENT
Freq: Every day | Status: DC
  Administered 2011-12-26 – 2011-12-28 (×2)

## 2011-12-25 MED ORDER — FENTANYL CITRATE 0.05 MG/ML IJ SOLN
INTRAMUSCULAR | Status: AC | PRN
  Administered 2011-12-25 (×4): 25 ug via INTRAVENOUS

## 2011-12-25 NOTE — ED Notes (Signed)
Catheter pulled by Marinus Maw, RT

## 2011-12-25 NOTE — ED Notes (Signed)
Pain with ballooning 

## 2011-12-25 NOTE — Progress Notes (Addendum)
ANTICOAGULATION CONSULT NOTE - Follow Up Consult   Pharmacy Consult:  Heparin and coumadin Indication:  Anticoagulation post thrombectomy/thrombolysis of DVT  9/13: 0355 HL = <0.1 (goal 0.2 - 0.5 units/mL) Heparin weight = 78 kg  Assessment: 35 yo F s/p RLE venogram with partial mechanical thrombectomy s/p overnight infusion of TNK and heparin.  Now s/p repeat venogram, spoke with Dr Bonnielee Haff who indicated that the TNK is to be d/c'd and the heparin resumed per pharmacy an hour after the end of the procedure with pharmacy to titrate to achieve a standard goal heparin level of 0.3-0.7 and to initiate coumadin dosing.  Note: med history shows coumadin 7.5 mg po daily but pt never started this PTA. She did obtain 10 doses of lovenox and took one prior to arrival but still has 9 left.  Coumadin points score: 8  Goal of Therapy:  Heparin level 0.3-0.7 INR 2-3 Monitor platelets by anticoagulation protocol: Yes  Plan:  - Resume heparin at a rate of 1000 units/hr  - f/u 8h heparin level - Coumadin 10 mg po x 1 - Daily Heparin level, INR, CBC - Initiate coumadin education with book/vido  Jill Side L. Illene Bolus, PharmD, BCPS Clinical Pharmacist Pager: [phone] Pharmacy: [phone] 12/25/2011 12:34 PM

## 2011-12-25 NOTE — Progress Notes (Signed)
ANTICOAGULATION CONSULT NOTE - Follow Up Consult   Pharmacy Consult:  Heparin and coumadin Indication:  Anticoagulation post thrombectomy/thrombolysis of DVT   Heparin weight = 78 kg  Assessment: 36 yo F s/p RLE venogram with partial mechanical thrombectomy s/p overnight infusion of TNK and heparin.  Now s/p repeat venogram TNKase d/c today.  Heparin drip was restarted 1000 uts/hr HL < 0.1 < goal.  No bleeding noted.    Note: med history shows coumadin 7.5 mg po daily but pt never started this PTA. She did obtain 10 doses of lovenox and took one prior to arrival but still has 9 left.  Coumadin points score: 8  Goal of Therapy:  Heparin level 0.3-0.7 INR 2-3 Monitor platelets by anticoagulation protocol: Yes  Plan:  Increase Heparin drip 1200 uts/hr Daily HL, CBC  Leota Sauers Pharm.D. CPP, BCPS Clinical Pharmacist [phone] 12/25/2011 9:35 PM

## 2011-12-25 NOTE — ED Notes (Signed)
Hematuria noted, Dr Bonnielee Haff aware.  "From angiojet, will clear in 24 hours"

## 2011-12-25 NOTE — Care Management Note (Addendum)
    Page 1 of 1   12/31/2011     2:11:53 PM   CARE MANAGEMENT NOTE 12/31/2011  Patient:  Kari Rice,Kari Rice   Account Number:  [id number]  Date Initiated:  12/25/2011  Documentation initiated by:  Junius Creamer  Subjective/Objective Assessment:   adm w swollen limb     Action/Plan:   lives w fam, pcp dr Idamae Schuller [place]   Anticipated DC Date:  12/30/2011   Anticipated DC Plan:  HOME/SELF CARE      DC Planning Services  CM consult      Choice offered to / List presented to:             Status of service:  Completed, signed off Medicare Important Message given?   (If response is "NO", the following Medicare IM given date fields will be blank) Date Medicare IM given:   Date Additional Medicare IM given:    Discharge Disposition:  HOME/SELF CARE  Per UR Regulation:  Reviewed for med. necessity/level of care/duration of stay  If discussed at Long Length of Stay Meetings, dates discussed:   12/31/2011    Comments:  12/30/11 Jahmere Bramel,RN,BSN 1000 PT FOR DISCHARGE HOME TODAY WITH HER PARENTS.  SHE DENIES ANY DC NEEDS.  9/13 8:33a debbie dowell rn,bsn [phone]

## 2011-12-25 NOTE — Plan of Care (Signed)
Problem: Consults Goal: Diagnosis - Venous Thromboembolism (VTE) Choose a selection DVT (Deep Vein Thrombosis) Right femoral artery.

## 2011-12-25 NOTE — ED Notes (Addendum)
TNK and Heparin stopped at 1105 per Dr Bonnielee Haff

## 2011-12-25 NOTE — Progress Notes (Signed)
Report called to receiving nurse Dois Davenport, 2019.  Past medical history and present hospitalization progression reported.  All questions answered, all belongings transferred with family at bedside.

## 2011-12-25 NOTE — ED Notes (Signed)
O2 d/c'd 

## 2011-12-25 NOTE — Progress Notes (Addendum)
  VASCULAR AND VEIN SURGERY PROGRESS NOTE  Progress note  Date of Surgery: Right leg thrombolysis by IR    HPI: Kari Rice is a 36 y.o. female right leg swelling and redness and pain.  Was seen by Dr Edilia Bo 8/14 with chronic venous insufficiency; doppler of right low leg neg.  Revisit 9/11 doppler positive for femoral vein dvt. Procedure: 12-24-2011 1. RLE venogram  2. Partial mechanical thrombectomy  3. Initiation of overnight venous thrombolysis    Significant Diagnostic Studies: CBC    Component Value Date/Time   WBC 9.5 12/25/2011 0858   RBC 4.05 12/25/2011 0858   HGB 11.6* 12/25/2011 0858   HCT 35.3* 12/25/2011 0858   PLT 194 12/25/2011 0858   MCV 87.2 12/25/2011 0858   MCH 28.6 12/25/2011 0858   MCHC 32.9 12/25/2011 0858   RDW 12.2 12/25/2011 0858   LYMPHSABS 2.5 12/24/2011 0645   MONOABS 0.6 12/24/2011 0645   EOSABS 0.4 12/24/2011 0645   BASOSABS 0.0 12/24/2011 0645    BMET    Component Value Date/Time   NA 140 12/24/2011 0645   K 3.9 12/24/2011 0645   CL 105 12/24/2011 0645   CO2 23 12/24/2011 0645   GLUCOSE 88 12/24/2011 0645   BUN 11 12/24/2011 0645   CREATININE 0.57 12/24/2011 0645   CALCIUM 9.3 12/24/2011 0645   GFRNONAA >90 12/24/2011 0645   GFRAA >90 12/24/2011 0645    COAG Lab Results  Component Value Date   INR 1.19 12/24/2011   INR 1.09 12/24/2011   INR 1.05 12/24/2011   No results found for this basename: PTT     I/O last 3 completed shifts: In: 2007.3 [P.O.:222; I.V.:504.1; Other:250; IV Piggyback:1031.2] Out: 1550 [Urine:1550]  Physical Examination Patient comfortable, except back pain from lying flat in hospital bed Palpable PT DP pulses bilateral equal Min. Edema in right LE      Assessment/Plan  Kari Rice is a 36 y.o. year old female who is S/P  Patient was placed on heparin dose per pharmacy  Coumadin goals 2.0 to 3.0 Patient has Lovenox at home for 4 days to use when she is d/c   Clinton Gallant [facility] 12/25/2011 1:21  PM  Thombolysis was successful. She had PTA of Right EIV. Coumadin needs to be therapeutic before she goes home. Elevate leg. Ambulate tomorrow.  Di Kindle. Edilia Bo, MD, FACS Beeper [phone] 12/25/2011

## 2011-12-25 NOTE — ED Notes (Signed)
Pt reports no pain, comfortable.

## 2011-12-25 NOTE — ED Notes (Signed)
O2 2L/[place] started 

## 2011-12-25 NOTE — Progress Notes (Signed)
ANTICOAGULATION CONSULT NOTE - Follow Up Consult   Pharmacy Consult:  Heparin Indication:  Anticoagulation post thrombectomy/thrombolysis of DVT   9/13: 0355 HL = <0.1 (goal 0.2 - 0.5 units/mL) Heparin weight = 78 kg   Assessment: 36 yo F s/p RLE venogram with partial mechanical thrombectomy and initiation of overnight venous thrombolysis.  Currently receiving heparin at 500 units/hr (infusing into right femoral sheath) along with tenecteplace infusion at 0.5 mg/hr.  Heparin level undetectable, CBC stable, fibrinogen WNL, no bleeding reported.  Per Dr. Archer Asa, Pharmacy may adjust heparin rate but not to exceed 800 units/hr.     Goal of Therapy:  Heparin level 0.2-0.5 units/ml Monitor platelets by anticoagulation protocol: Yes    Plan:  - Continue heparin gtt at the max rate of 800 units/hr (~10 units/kg) - F/u heparin plans after venogram this am - Daily Heparin level and CBC while on heparin  Thimothy Barretta L. Illene Bolus, PharmD, BCPS Clinical Pharmacist Pager: [phone] Pharmacy: [phone] 12/25/2011 8:40 AM

## 2011-12-26 LAB — HEPARIN LEVEL (UNFRACTIONATED): Heparin Unfractionated: <0.1 IU/mL — ABNORMAL LOW (ref 0.30–0.70)

## 2011-12-26 LAB — CBC
MCHC: 33.6 g/dL (ref 30.0–36.0)
Platelets: 185 10*3/uL (ref 150–400)
RDW: 12.1 % (ref 11.5–15.5)
WBC: 9.8 10*3/uL (ref 4.0–10.5)

## 2011-12-26 LAB — PROTIME-INR
INR: 1.24 (ref 0.00–1.49)
Prothrombin Time: 15.9 seconds — ABNORMAL HIGH (ref 11.6–15.2)

## 2011-12-26 MED ORDER — WARFARIN SODIUM 10 MG PO TABS
10.0000 mg | ORAL_TABLET | Freq: Once | ORAL | Status: AC
  Administered 2011-12-26: 10 mg via ORAL
  Filled 2011-12-26: qty 1

## 2011-12-26 MED ORDER — HEPARIN (PORCINE) IN NACL 100-0.45 UNIT/ML-% IJ SOLN
1900.0000 [IU]/h | INTRAMUSCULAR | Status: DC
  Administered 2011-12-26: 1700 [IU]/h via INTRAVENOUS
  Administered 2011-12-27 – 2011-12-30 (×5): 1900 [IU]/h via INTRAVENOUS
  Filled 2011-12-26 (×13): qty 250

## 2011-12-26 NOTE — Progress Notes (Signed)
ANTICOAGULATION CONSULT NOTE - Follow Up Consult  Pharmacy Consult for heparin Indication: DVT  Labs:  Basename 12/26/11 2138 12/26/11 1425 12/26/11 0624 12/25/11 0858 12/24/11 2158 12/24/11 1600 12/24/11 0645  HGB -- -- 11.1* 11.6* -- -- --  HCT -- -- 33.0* 35.3* 33.3* -- --  PLT -- -- 185 194 218 -- --  APTT -- -- -- -- 42* 34 32  LABPROT -- -- 15.9* -- 15.4* 14.3 --  INR -- -- 1.24 -- 1.19 1.09 --  HEPARINUNFRC 0.23* <0.10* <0.10* -- -- -- --  CREATININE -- -- -- -- -- -- 0.57  CKTOTAL -- -- -- -- -- -- --  CKMB -- -- -- -- -- -- --  TROPONINI -- -- -- -- -- -- --     Assessment: 36yo female remains subtherapeutic on heparin though now increasing.  Goal of Therapy:  Heparin level 0.3-0.7 units/ml   Plan:  Will increase heparin gtt by 2 units/kg/hr to 1900 units/hr and check level with am labs.  Colleen Can PharmD BCPS 12/26/2011,11:37 PM

## 2011-12-26 NOTE — Progress Notes (Signed)
Pt ambulated 150 ft with rolling walker and tolerated activity well. Will continue to monitor and encourage.

## 2011-12-26 NOTE — Progress Notes (Signed)
ANTICOAGULATION CONSULT NOTE - Follow Up Consult   Pharmacy Consult:  Heparin and coumadin Indication:  Anticoagulation post thrombectomy/thrombolysis of DVT   Heparin weight = 78 kg  Assessment: 36 yo F s/p RLE venogram with partial mechanical thrombectomy s/p overnight infusion of TNK and heparin.  Now s/p repeat venogram TNKase d/ced.  Heparin drip at 1200 uts/hr HL < 0.1 < goal.  RN notes heavy flow from menses. H/h low stable  Note: med history shows coumadin 7.5 mg po daily but pt never started this PTA. She did obtain 10 doses of lovenox and took one prior to arrival but still has 9 left.  Coumadin points score: 8  Goal of Therapy:  Heparin level 0.3-0.7 INR 2-3 Monitor platelets by anticoagulation protocol: Yes  Plan:  Increase Heparin drip 1450 uts/hr Repeat coumadin 10 mg today Daily HL, CBC, INR  Ainhoa Rallo Pharm.D Clinical Pharmacist [phone] 12/26/2011 8:22 AM

## 2011-12-26 NOTE — Progress Notes (Signed)
[facility] [place]  Subjective  -   No complaints today   Physical Exam:  Right leg still swolen Sheath removed Pain improved       Assessment/Plan:  S/p successful thrombolysis and stenting of iliac vein  Patient needs to be theraputic on coumadin prior to discharge. Ambulate today D/c foley  BRABHAM IV, V. WELLS 12/26/2011 8:48 AM --  Filed Vitals:   12/26/11 0505  BP: 114/72  Pulse: 77  Temp: 98.6 F (37 C)  Resp: 18    Intake/Output Summary (Last 24 hours) at 12/26/11 0848 Last data filed at 12/26/11 0506  Gross per 24 hour  Intake    196 ml  Output   1901 ml  Net  -1705 ml     Laboratory CBC    Component Value Date/Time   WBC 9.8 12/26/2011 0624   HGB 11.1* 12/26/2011 0624   HCT 33.0* 12/26/2011 0624   PLT 185 12/26/2011 0624    BMET    Component Value Date/Time   NA 140 12/24/2011 0645   K 3.9 12/24/2011 0645   CL 105 12/24/2011 0645   CO2 23 12/24/2011 0645   GLUCOSE 88 12/24/2011 0645   BUN 11 12/24/2011 0645   CREATININE 0.57 12/24/2011 0645   CALCIUM 9.3 12/24/2011 0645   GFRNONAA >90 12/24/2011 0645   GFRAA >90 12/24/2011 0645    COAG Lab Results  Component Value Date   INR 1.24 12/26/2011   INR 1.19 12/24/2011   INR 1.09 12/24/2011   No results found for this basename: PTT    Antibiotics Anti-infectives    None       V. Charlena Cross, M.D. [facility] [place] Office: [phone] Pager:  [phone]

## 2011-12-26 NOTE — Progress Notes (Signed)
ANTICOAGULATION CONSULT NOTE - Follow Up Consult   Pharmacy Consult:  Heparin Indication:  Anticoagulation post thrombectomy/thrombolysis of DVT   Heparin weight = 78 kg  Assessment: 36 yo F s/p RLE venogram with partial mechanical thrombectomy s/p overnight infusion of TNK and heparin.  Now s/p repeat venogram TNKase d/ced.  Heparin still undetectable. Will adjust rate again.  Goal of Therapy:  Heparin level 0.3-0.7 INR 2-3 Monitor platelets by anticoagulation protocol: Yes  Plan:  Increase Heparin drip 1700 units/hr F/u with 6 hr heparin level

## 2011-12-26 NOTE — Progress Notes (Signed)
Subjective: Pt feels ok. Denies much pain. Still some swelling of rt leg  Objective: Physical Exam: BP 114/72  Pulse 77  Temp 98.6 F (37 C) (Oral)  Resp 18  Ht 5\' 4"  (1.626 m)  Wt 222 lb (100.699 kg)  BMI 38.11 kg/m2  SpO2 97%  LMP 12/23/2011 Rt LE still with 1-2+ edema, not tight, nontender. Pop site V-pad dressing removed. Site clean and dry, no hematoma. Excellent pedal pulses, feet warm    Labs: CBC  Basename 12/26/11 0624 12/25/11 0858  WBC 9.8 9.5  HGB 11.1* 11.6*  HCT 33.0* 35.3*  PLT 185 194   BMET  Basename 12/24/11 0645  NA 140  K 3.9  CL 105  CO2 23  GLUCOSE 88  BUN 11  CREATININE 0.57  CALCIUM 9.3   LFT No results found for this basename: PROT,ALBUMIN,AST,ALT,ALKPHOS,BILITOT,BILIDIR,IBILI,LIPASE in the last 72 hours PT/INR  Basename 12/26/11 0624 12/24/11 2158  LABPROT 15.9* 15.4*  INR 1.24 1.19     Studies/Results: Ir Intravas Stent Non Coron,carot,vert,iliac,low Ext Art  12/25/2011  *RADIOLOGY REPORT*  Clinical Data/Indication: RIGHT LOWER EXTREMITY DVT.  24 HOURS STATUS POST VENOUS LYSIS.  POST LYSIS VENOUS CHECK ,PTA VENOUS,STENT PLACEMENT  Sedation: Versed three mg, Fentanyl 100 mg.  Total Moderate Sedation Time: 36 minutes.  Contrast Volume: 100 ml Omnipaque-300.  Fluoroscopy Time: 7.6 minutes.  Procedure: The procedure, risks, benefits, and alternatives were explained to the patient. Questions regarding the procedure were encouraged and answered. The patient understands and consents to the procedure.  The right popliteal fossa in the prone position was prepped with betadine in a sterile fashion, and a sterile drape was applied covering the operative field. A sterile gown and sterile gloves were used for the procedure.  Contrast was injected utilizing sterile technique into the sheath and infusion catheter.  The infusion catheter was then removed over an Amplatz wire and repeat venography was performed through the sheath.  Patency of the deep  venous system in the thigh was noted. There is very minimal residual thrombus in the common femoral vein. The external iliac vein is patent.  There is a critical stenosis at the junction between the common and external iliac vein.  The Angiojet device was then advanced over the Amplatz wire to the common femoral vein.  Thrombi lysis utilizing the Angiojet was performed for proximally 45 seconds.  Repeat venography was performed.  Angioplasty device was utilized to dilate the narrowing of the common and external iliac vein junction.  Repeat venogram demonstrates little improvement.  A 12 x 60 Smart stent was then deployed across the narrowing.  It was then post dilated to 10 mm.  Final imaging was performed.  The sheath was removed and hemostasis was achieved with a hemostasis had.  Findings: Right lower extremity venography demonstrates near complete resolution of thrombus with minimal thrombus residual in the common femoral vein.  There is also noted be critical stenosis at the junction between the common and external iliac vein.  After thrombi lysis utilizing the Angiojet, the common femoral vein is noted to be patent.  After dilatation of the venous stenosis, repeat venography demonstrates very little improvement and residual stenosis of the deep venous system of the pelvis.  After stent placement, the internal and external iliac venous system is noted to be widely patent.  Complications: None.  IMPRESSION: There is near complete resolution of the acute thrombus within the right pelvic and lower extremity deep venous system.  Minimal residual thrombus in the common  femoral vein was treated with an Angiojet device.  A critical stenosis which was resistant to angioplasty alone at the confluence of the external and iliac vein was treated successfully with a 12 x 60 Nitinol stent.  The patient will be placed on oral anticoagulation and compression stockings.   Original Report Authenticated By: Donavan Burnet, M.D.      Ir Pta Venous Right  12/25/2011  *RADIOLOGY REPORT*  Clinical Data/Indication: RIGHT LOWER EXTREMITY DVT.  24 HOURS STATUS POST VENOUS LYSIS.  POST LYSIS VENOUS CHECK ,PTA VENOUS,STENT PLACEMENT  Sedation: Versed three mg, Fentanyl 100 mg.  Total Moderate Sedation Time: 36 minutes.  Contrast Volume: 100 ml Omnipaque-300.  Fluoroscopy Time: 7.6 minutes.  Procedure: The procedure, risks, benefits, and alternatives were explained to the patient. Questions regarding the procedure were encouraged and answered. The patient understands and consents to the procedure.  The right popliteal fossa in the prone position was prepped with betadine in a sterile fashion, and a sterile drape was applied covering the operative field. A sterile gown and sterile gloves were used for the procedure.  Contrast was injected utilizing sterile technique into the sheath and infusion catheter.  The infusion catheter was then removed over an Amplatz wire and repeat venography was performed through the sheath.  Patency of the deep venous system in the thigh was noted. There is very minimal residual thrombus in the common femoral vein. The external iliac vein is patent.  There is a critical stenosis at the junction between the common and external iliac vein.  The Angiojet device was then advanced over the Amplatz wire to the common femoral vein.  Thrombi lysis utilizing the Angiojet was performed for proximally 45 seconds.  Repeat venography was performed.  Angioplasty device was utilized to dilate the narrowing of the common and external iliac vein junction.  Repeat venogram demonstrates little improvement.  A 12 x 60 Smart stent was then deployed across the narrowing.  It was then post dilated to 10 mm.  Final imaging was performed.  The sheath was removed and hemostasis was achieved with a hemostasis had.  Findings: Right lower extremity venography demonstrates near complete resolution of thrombus with minimal thrombus residual in the  common femoral vein.  There is also noted be critical stenosis at the junction between the common and external iliac vein.  After thrombi lysis utilizing the Angiojet, the common femoral vein is noted to be patent.  After dilatation of the venous stenosis, repeat venography demonstrates very little improvement and residual stenosis of the deep venous system of the pelvis.  After stent placement, the internal and external iliac venous system is noted to be widely patent.  Complications: None.  IMPRESSION: There is near complete resolution of the acute thrombus within the right pelvic and lower extremity deep venous system.  Minimal residual thrombus in the common femoral vein was treated with an Angiojet device.  A critical stenosis which was resistant to angioplasty alone at the confluence of the external and iliac vein was treated successfully with a 12 x 60 Nitinol stent.  The patient will be placed on oral anticoagulation and compression stockings.   Original Report Authenticated By: Donavan Burnet, M.D.    Ir Infusion Thrombol Venous Initial (ms)  12/24/2011  *RADIOLOGY REPORT*  IR INFUSION THROMBOLYSIS VENOUS INITIAL; IR VENOUS ANGIOPLASTY RIGHT  Date: 12/24/2011  Clinical History: 36 year old female with right lower extremity swelling and recently diagnosed femoral DVT.  She presents for catheter directed thrombolysis/thrombectomy, venography and possible  angioplasty and stenting.  Procedures Performed: 1. Ultrasound-guided puncture of the right popliteal vein 2.  Right lower extremity venogram 3.  Mechanical thrombolysis/thrombectomy with AngioJet 4.  Venous angioplasty and balloon maceration of thrombus in the mid femoral vein, common femoral vein and external iliac vein 5.  Placement of multi side-hole infusion catheter with initiation of venous lysis  Interventional Radiologist:  Sterling Big, MD  Sedation: Moderate (conscious) sedation was used.  For mg Versed, 200 mcg Fentanyl were administered  intravenously.  The patient's vital signs were monitored continuously by radiology nursing throughout the procedure.  Sedation Time: 60 minutes  Fluoroscopy time: 10.4  Contrast volume: 60 ml Visipaque 270 administered intravascularly  PROCEDURE/FINDINGS:   Informed consent was obtained from the patient following explanation of the procedure, risks, benefits and alternatives. The patient understands, agrees and consents for the procedure. All questions were addressed. A time out was performed.  Maximal barrier sterile technique utilized including caps, mask, sterile gowns, sterile gloves, large sterile drape, hand hygiene, and betadine skin prep.  The right popliteal fossa was interrogated with ultrasound.  The popliteal vein was found be widely patent.  Local anesthesia was achieved with infiltration of 1% lidocaine.  Under direct sonographic guidance, the popliteal vein was punctured with a 21- gauge micropuncture needle.  An image was obtained and stored electronic medical record.  A wire was advanced into the femoral vein and a 6-French vascular sheath advanced in the popliteal vein. Multi station left lower extremity venogram was then performed. The popliteal and distal femoral veins are widely patent.  There is a focal, eccentric filling defect in the mid femoral vein resulting in greater than 70% luminal stenosis and venous collateral formation by passing the area of narrowing.  This is most consistent with chronic wall adherent thrombus.  The more proximal femoral vein is patent.  There is a large, partially obstructing thrombus in the common femoral vein extending into the external iliac vein. A high-grade focal stenosis is identified in the proximal left external iliac vein at the level of the inferior sacroiliac joint.  This likely represents the underlying etiology of the patient's current thrombotic episode.  Mechanical rheolytic thrombectomy was then attempted with the AngioJet device in both the  region of the acute thrombus, and chronic wall adherent thrombus.  Both areas of thrombus were relatively resistant to initial rheolytic thrombectomy.  Therefore, balloon maceration was performed with first and 8 x 40 Conquest balloon followed by that 10 x 40 Mustang balloon.  This resulted in partial disruption of the chronic thrombus in the mid femoral vein with improved luminal diameter but persistent filling of the collateral network.  The acute thrombus in the common femoral and external iliac veins is partially disrupted as well.  Therefore, the decision was made to continue venous lysis overnight to more fully debulk the thrombus before treating the underlying iliac vein stenosis.  This will minimize risk of the procedure related pulmonary embolus.  With high-grade stenosis, she is currently Otto protected from PE.  Therefore, a 20 cm infusion length, 90 cm 5-French Unifuse infusion catheter was advanced over the wire and positioned across the bulk of the clot in the common femoral and external iliac vein.  The 6- [place] sheath, and catheter assembly were secured to the skin with 0- silk suture and a sterile bandage.  Venous lysis was begun at a rate of 0.5 mg per hour of TNK-TPA infused through the infusion catheter, and 500 units per hour heparin infused  through the popliteal sheath.  The patient tolerated the procedure well.  There is no immediate complication.  She was transferred to intensive care unit in stable condition.  IMPRESSION:  1.  Diagnostic right lower extremity venogram and demonstrates:       a. 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 Moderate volume of acute thrombus in the common femoral vein extending into the external iliac vein       c. 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  Successful partial mechanical thrombectomy of both the chronic mid femoral vein thrombus, and the acute common femoral and iliac vein thrombus.  3.  Placement of a multi side-hole infusion catheter and initiation of venous thrombolysis  4.  The patient will return interventional radiology after 24 hours of thrombolysis for repeat venogram and likely repeated venoplasty of both lesions.  I suspect the patient will also require stenting of the left common and external iliac vein. If necessary, the chronic thrombus in the mid left femoral vein can also be stented with a self-expanding nitinol stent.  5.  The patient will require unilateral 30-40 mmHg thigh-high compression hose worn daily for 2 years to decrease the risk of developing post thrombotic syndrome. She also require 3-6 months of anticoagulation with Lovenox bridged to heparin.  Signed,  Sterling Big, MD Vascular & Interventional Radiologist [facility] Radiology   Original Report Authenticated By: Zollie Scale F/u Eval Art/ven Final Day (ms)  12/25/2011  *RADIOLOGY REPORT*  Clinical Data/Indication: RIGHT LOWER EXTREMITY DVT.  24 HOURS STATUS POST VENOUS LYSIS.  POST LYSIS VENOUS CHECK ,PTA VENOUS,STENT PLACEMENT  Sedation: Versed three mg, Fentanyl 100 mg.  Total Moderate Sedation Time: 36 minutes.  Contrast Volume: 100 ml Omnipaque-300.  Fluoroscopy Time: 7.6 minutes.  Procedure: The procedure, risks, benefits, and alternatives were explained to the patient. Questions regarding the procedure were encouraged and answered. The patient understands and consents to the procedure.  The right popliteal fossa in the prone position was prepped with betadine in a sterile fashion, and a sterile drape was applied covering the operative field. A sterile gown and sterile gloves were used for the procedure.  Contrast was injected utilizing sterile technique into the sheath and infusion catheter.  The infusion catheter was then removed over an Amplatz wire  and repeat venography was performed through the sheath.  Patency of the deep venous system in the thigh was noted. There is very minimal residual thrombus in the common femoral vein. The external iliac vein is patent.  There is a critical stenosis at the junction between the common and external iliac vein.  The Angiojet device was then advanced over the Amplatz wire to the common femoral vein.  Thrombi lysis utilizing the Angiojet was performed for proximally 45 seconds.  Repeat venography was performed.  Angioplasty device was utilized to dilate the narrowing of the common and external iliac vein junction.  Repeat venogram demonstrates little improvement.  A 12 x 60 Smart stent was then deployed across the narrowing.  It was then post dilated to 10 mm.  Final imaging was performed.  The sheath was removed and hemostasis was achieved with a hemostasis had.  Findings: Right lower extremity venography demonstrates near complete resolution of thrombus with minimal thrombus residual in the common femoral  vein.  There is also noted be critical stenosis at the junction between the common and external iliac vein.  After thrombi lysis utilizing the Angiojet, the common femoral vein is noted to be patent.  After dilatation of the venous stenosis, repeat venography demonstrates very little improvement and residual stenosis of the deep venous system of the pelvis.  After stent placement, the internal and external iliac venous system is noted to be widely patent.  Complications: None.  IMPRESSION: There is near complete resolution of the acute thrombus within the right pelvic and lower extremity deep venous system.  Minimal residual thrombus in the common femoral vein was treated with an Angiojet device.  A critical stenosis which was resistant to angioplasty alone at the confluence of the external and iliac vein was treated successfully with a 12 x 60 Nitinol stent.  The patient will be placed on oral anticoagulation and  compression stockings.   Original Report Authenticated By: Donavan Burnet, M.D.     Assessment/Plan: Rt LE DVT s/p lysis Sheath removed. On Hep gtt and Coumadin INR 1.24 Reviewed VVS note, OOB today and DC Foley    LOS: 2 days    Brayton El PA-C 12/26/2011 8:53 AM

## 2011-12-27 LAB — CBC
MCH: 28.6 pg (ref 26.0–34.0)
MCHC: 33.1 g/dL (ref 30.0–36.0)
MCV: 86.4 fL (ref 78.0–100.0)
Platelets: 225 10*3/uL (ref 150–400)
RDW: 12.2 % (ref 11.5–15.5)

## 2011-12-27 MED ORDER — WARFARIN SODIUM 10 MG PO TABS
10.0000 mg | ORAL_TABLET | Freq: Once | ORAL | Status: AC
  Administered 2011-12-27: 10 mg via ORAL
  Filled 2011-12-27: qty 1

## 2011-12-27 NOTE — Progress Notes (Addendum)
  VASCULAR AND VEIN SURGERY PROGRESS NOTE  Progress note    HPI: Kari Rice is a 36 y.o. female right leg swelling and redness and pain.  Was seen by Dr Edilia Bo 8/14 with chronic venous insufficiency; doppler of right low leg neg.  Revisit 9/11 doppler positive for femoral vein dvt.  Procedure: 12-24-2011  1. RLE venogram  2. Partial mechanical thrombectomy  3. Initiation of overnight venous thrombolysis       Significant Diagnostic Studies: CBC    Component Value Date/Time   WBC 9.6 12/27/2011 0745   RBC 4.12 12/27/2011 0745   HGB 11.8* 12/27/2011 0745   HCT 35.6* 12/27/2011 0745   PLT 225 12/27/2011 0745   MCV 86.4 12/27/2011 0745   MCH 28.6 12/27/2011 0745   MCHC 33.1 12/27/2011 0745   RDW 12.2 12/27/2011 0745   LYMPHSABS 2.5 12/24/2011 0645   MONOABS 0.6 12/24/2011 0645   EOSABS 0.4 12/24/2011 0645   BASOSABS 0.0 12/24/2011 0645    BMET    Component Value Date/Time   NA 140 12/24/2011 0645   K 3.9 12/24/2011 0645   CL 105 12/24/2011 0645   CO2 23 12/24/2011 0645   GLUCOSE 88 12/24/2011 0645   BUN 11 12/24/2011 0645   CREATININE 0.57 12/24/2011 0645   CALCIUM 9.3 12/24/2011 0645   GFRNONAA >90 12/24/2011 0645   GFRAA >90 12/24/2011 0645    COAG Lab Results  Component Value Date   INR 1.24 12/26/2011   INR 1.19 12/24/2011   INR 1.09 12/24/2011   No results found for this basename: PTT     I/O last 3 completed shifts: In: 220 [P.O.:220] Out: 3175 [Urine:3175]  Physical Examination Patient is comfortable walking with no assistance Palpable DP/PT pulses Min. Edema right thigh and LE.  Patient Vitals for the past 24 hrs:  BP Temp Temp src Pulse Resp SpO2  12/27/11 0445 97/65 mmHg 98.2 F (36.8 C) Oral 68  20  94 %  12/26/11 2144 115/54 mmHg 99.3 F (37.4 C) Oral 75  20  97 %  12/26/11 1510 113/76 mmHg 98.4 F (36.9 C) Oral 85  18  94 %     Assessment/Planright leg swelling and redness and pain.  Procedure: 12-24-2011  1. RLE venogram  2. Partial mechanical  thrombectomy  3. Initiation of overnight venous thrombolysis  Pending INR 2.0 to 3.0 prior to D/C continue heparin.    Clinton Gallant [facility] 12/27/2011 8:33 AM   Agree with above She feels her leg is better today Was able to ambulate yesterday.  INR 1.2 today.  Continue IV heparin until INR >2.0  Durene Cal

## 2011-12-27 NOTE — Progress Notes (Signed)
ANTICOAGULATION CONSULT NOTE - Follow Up Consult  Pharmacy Consult for heparin Indication: DVT  Labs:  Basename 12/27/11 0745 12/26/11 2138 12/26/11 1425 12/26/11 0624 12/25/11 0858 12/24/11 2158 12/24/11 1600  HGB 11.8* -- -- 11.1* -- -- --  HCT 35.6* -- -- 33.0* 35.3* -- --  PLT 225 -- -- 185 194 -- --  APTT -- -- -- -- -- 42* 34  LABPROT 18.1* -- -- 15.9* -- 15.4* --  INR 1.47 -- -- 1.24 -- 1.19 --  HEPARINUNFRC 0.37 0.23* <0.10* -- -- -- --  CREATININE -- -- -- -- -- -- --  CKTOTAL -- -- -- -- -- -- --  CKMB -- -- -- -- -- -- --  TROPONINI -- -- -- -- -- -- --     Assessment: Heparin now therapeutic. INR is trending up. She has received 2 doses of coumadin 10mg . CBC stable.   Goal of Therapy:  Heparin level 0.3-0.7 units/ml INR = 2-3   Plan:   Cont heparin drip at 1900 units/hr Coumadin 10mg  PO x1

## 2011-12-27 NOTE — Progress Notes (Signed)
Subjective: Pt ok. Foley out, voiding well Ambulating well No new c/o  Objective: Physical Exam: BP 97/65  Pulse 68  Temp 98.2 F (36.8 C) (Oral)  Resp 20  Ht 5\' 4"  (1.626 m)  Wt 222 lb (100.699 kg)  BMI 38.11 kg/m2  SpO2 94%  LMP 12/23/2011 R LE edema stable to some better NT Pop site clean, no hematoma    Labs: CBC  Basename 12/27/11 0745 12/26/11 0624  WBC 9.6 9.8  HGB 11.8* 11.1*  HCT 35.6* 33.0*  PLT 225 185   BMET No results found for this basename: NA:2,K:2,CL:2,CO2:2,GLUCOSE:2,BUN:2,CREATININE:2,CALCIUM:2 in the last 72 hours LFT No results found for this basename: PROT,ALBUMIN,AST,ALT,ALKPHOS,BILITOT,BILIDIR,IBILI,LIPASE in the last 72 hours PT/INR  Basename 12/27/11 0745 12/26/11 0624  LABPROT 18.1* 15.9*  INR 1.47 1.24     Studies/Results: Ir Intravas Stent Non Coron,carot,vert,iliac,low Ext Art  12/25/2011  *RADIOLOGY REPORT*  Clinical Data/Indication: RIGHT LOWER EXTREMITY DVT.  24 HOURS STATUS POST VENOUS LYSIS.  POST LYSIS VENOUS CHECK ,PTA VENOUS,STENT PLACEMENT  Sedation: Versed three mg, Fentanyl 100 mg.  Total Moderate Sedation Time: 36 minutes.  Contrast Volume: 100 ml Omnipaque-300.  Fluoroscopy Time: 7.6 minutes.  Procedure: The procedure, risks, benefits, and alternatives were explained to the patient. Questions regarding the procedure were encouraged and answered. The patient understands and consents to the procedure.  The right popliteal fossa in the prone position was prepped with betadine in a sterile fashion, and a sterile drape was applied covering the operative field. A sterile gown and sterile gloves were used for the procedure.  Contrast was injected utilizing sterile technique into the sheath and infusion catheter.  The infusion catheter was then removed over an Amplatz wire and repeat venography was performed through the sheath.  Patency of the deep venous system in the thigh was noted. There is very minimal residual thrombus in the  common femoral vein. The external iliac vein is patent.  There is a critical stenosis at the junction between the common and external iliac vein.  The Angiojet device was then advanced over the Amplatz wire to the common femoral vein.  Thrombi lysis utilizing the Angiojet was performed for proximally 45 seconds.  Repeat venography was performed.  Angioplasty device was utilized to dilate the narrowing of the common and external iliac vein junction.  Repeat venogram demonstrates little improvement.  A 12 x 60 Smart stent was then deployed across the narrowing.  It was then post dilated to 10 mm.  Final imaging was performed.  The sheath was removed and hemostasis was achieved with a hemostasis had.  Findings: Right lower extremity venography demonstrates near complete resolution of thrombus with minimal thrombus residual in the common femoral vein.  There is also noted be critical stenosis at the junction between the common and external iliac vein.  After thrombi lysis utilizing the Angiojet, the common femoral vein is noted to be patent.  After dilatation of the venous stenosis, repeat venography demonstrates very little improvement and residual stenosis of the deep venous system of the pelvis.  After stent placement, the internal and external iliac venous system is noted to be widely patent.  Complications: None.  IMPRESSION: There is near complete resolution of the acute thrombus within the right pelvic and lower extremity deep venous system.  Minimal residual thrombus in the common femoral vein was treated with an Angiojet device.  A critical stenosis which was resistant to angioplasty alone at the confluence of the external and iliac vein was treated successfully with  a 12 x 60 Nitinol stent.  The patient will be placed on oral anticoagulation and compression stockings.   Original Report Authenticated By: Donavan Burnet, M.D.    Ir Pta Venous Right  12/25/2011  *RADIOLOGY REPORT*  Clinical Data/Indication:  RIGHT LOWER EXTREMITY DVT.  24 HOURS STATUS POST VENOUS LYSIS.  POST LYSIS VENOUS CHECK ,PTA VENOUS,STENT PLACEMENT  Sedation: Versed three mg, Fentanyl 100 mg.  Total Moderate Sedation Time: 36 minutes.  Contrast Volume: 100 ml Omnipaque-300.  Fluoroscopy Time: 7.6 minutes.  Procedure: The procedure, risks, benefits, and alternatives were explained to the patient. Questions regarding the procedure were encouraged and answered. The patient understands and consents to the procedure.  The right popliteal fossa in the prone position was prepped with betadine in a sterile fashion, and a sterile drape was applied covering the operative field. A sterile gown and sterile gloves were used for the procedure.  Contrast was injected utilizing sterile technique into the sheath and infusion catheter.  The infusion catheter was then removed over an Amplatz wire and repeat venography was performed through the sheath.  Patency of the deep venous system in the thigh was noted. There is very minimal residual thrombus in the common femoral vein. The external iliac vein is patent.  There is a critical stenosis at the junction between the common and external iliac vein.  The Angiojet device was then advanced over the Amplatz wire to the common femoral vein.  Thrombi lysis utilizing the Angiojet was performed for proximally 45 seconds.  Repeat venography was performed.  Angioplasty device was utilized to dilate the narrowing of the common and external iliac vein junction.  Repeat venogram demonstrates little improvement.  A 12 x 60 Smart stent was then deployed across the narrowing.  It was then post dilated to 10 mm.  Final imaging was performed.  The sheath was removed and hemostasis was achieved with a hemostasis had.  Findings: Right lower extremity venography demonstrates near complete resolution of thrombus with minimal thrombus residual in the common femoral vein.  There is also noted be critical stenosis at the junction between  the common and external iliac vein.  After thrombi lysis utilizing the Angiojet, the common femoral vein is noted to be patent.  After dilatation of the venous stenosis, repeat venography demonstrates very little improvement and residual stenosis of the deep venous system of the pelvis.  After stent placement, the internal and external iliac venous system is noted to be widely patent.  Complications: None.  IMPRESSION: There is near complete resolution of the acute thrombus within the right pelvic and lower extremity deep venous system.  Minimal residual thrombus in the common femoral vein was treated with an Angiojet device.  A critical stenosis which was resistant to angioplasty alone at the confluence of the external and iliac vein was treated successfully with a 12 x 60 Nitinol stent.  The patient will be placed on oral anticoagulation and compression stockings.   Original Report Authenticated By: Donavan Burnet, M.D.    Ir Rande Lawman F/u Eval Art/ven Final Day (ms)  12/25/2011  *RADIOLOGY REPORT*  Clinical Data/Indication: RIGHT LOWER EXTREMITY DVT.  24 HOURS STATUS POST VENOUS LYSIS.  POST LYSIS VENOUS CHECK ,PTA VENOUS,STENT PLACEMENT  Sedation: Versed three mg, Fentanyl 100 mg.  Total Moderate Sedation Time: 36 minutes.  Contrast Volume: 100 ml Omnipaque-300.  Fluoroscopy Time: 7.6 minutes.  Procedure: The procedure, risks, benefits, and alternatives were explained to the patient. Questions regarding the procedure were encouraged and  answered. The patient understands and consents to the procedure.  The right popliteal fossa in the prone position was prepped with betadine in a sterile fashion, and a sterile drape was applied covering the operative field. A sterile gown and sterile gloves were used for the procedure.  Contrast was injected utilizing sterile technique into the sheath and infusion catheter.  The infusion catheter was then removed over an Amplatz wire and repeat venography was performed through  the sheath.  Patency of the deep venous system in the thigh was noted. There is very minimal residual thrombus in the common femoral vein. The external iliac vein is patent.  There is a critical stenosis at the junction between the common and external iliac vein.  The Angiojet device was then advanced over the Amplatz wire to the common femoral vein.  Thrombi lysis utilizing the Angiojet was performed for proximally 45 seconds.  Repeat venography was performed.  Angioplasty device was utilized to dilate the narrowing of the common and external iliac vein junction.  Repeat venogram demonstrates little improvement.  A 12 x 60 Smart stent was then deployed across the narrowing.  It was then post dilated to 10 mm.  Final imaging was performed.  The sheath was removed and hemostasis was achieved with a hemostasis had.  Findings: Right lower extremity venography demonstrates near complete resolution of thrombus with minimal thrombus residual in the common femoral vein.  There is also noted be critical stenosis at the junction between the common and external iliac vein.  After thrombi lysis utilizing the Angiojet, the common femoral vein is noted to be patent.  After dilatation of the venous stenosis, repeat venography demonstrates very little improvement and residual stenosis of the deep venous system of the pelvis.  After stent placement, the internal and external iliac venous system is noted to be widely patent.  Complications: None.  IMPRESSION: There is near complete resolution of the acute thrombus within the right pelvic and lower extremity deep venous system.  Minimal residual thrombus in the common femoral vein was treated with an Angiojet device.  A critical stenosis which was resistant to angioplasty alone at the confluence of the external and iliac vein was treated successfully with a 12 x 60 Nitinol stent.  The patient will be placed on oral anticoagulation and compression stockings.   Original Report  Authenticated By: Donavan Burnet, M.D.     Assessment/Plan: Rt LE DVT s/p lysis procedure INR 1.47 Cont IV Hep and po COumadin Pt may shower    LOS: 3 days    Brayton El PA-C 12/27/2011 10:36 AM

## 2011-12-27 NOTE — Plan of Care (Signed)
Problem: Phase III Progression Outcomes Goal: 02 sats stabilized Outcome: Completed/Met Date Met:  12/27/11 Denies sob. On room air with sats >95%. Will continue to monitor. Goal: Activity at appropriate level-compared to baseline (UP IN CHAIR FOR HEMODIALYSIS)  Outcome: Completed/Met Date Met:  12/27/11 Ambulates with a walker on the hallway. Tolerates fine. Denies pain or sob. Goal: Discharge plan remains appropriate-arrangements made Outcome: Progressing Pt's activity stabilizing and returning to baseline. Will discharge to home when stable per MD.

## 2011-12-27 NOTE — Progress Notes (Signed)
Pt ambulated 150 ft with rolling walker and tolerated activity well. Will continue to monitor.

## 2011-12-27 NOTE — Progress Notes (Signed)
Pt ambulated 150 ft with rolling walker and tolerated activity well. Will continue to monitor.  

## 2011-12-27 NOTE — Progress Notes (Signed)
Pt ambulated 500 ft with rolling walker and tolerated activity well. Will continue to monitor.

## 2011-12-28 LAB — PROTIME-INR: INR: 1.79 — ABNORMAL HIGH (ref 0.00–1.49)

## 2011-12-28 LAB — CBC
HCT: 33.2 % — ABNORMAL LOW (ref 36.0–46.0)
MCH: 29 pg (ref 26.0–34.0)
MCHC: 33.7 g/dL (ref 30.0–36.0)
MCV: 86 fL (ref 78.0–100.0)
RDW: 12.4 % (ref 11.5–15.5)
WBC: 8.9 10*3/uL (ref 4.0–10.5)

## 2011-12-28 MED ORDER — WARFARIN SODIUM 7.5 MG PO TABS
7.5000 mg | ORAL_TABLET | Freq: Once | ORAL | Status: AC
  Administered 2011-12-28: 7.5 mg via ORAL
  Filled 2011-12-28: qty 1

## 2011-12-28 NOTE — Progress Notes (Signed)
  Subjective: Rt femoral vein thrombolysis 9/12 PTA/stent placed 9/13 On coumadin and heparin while awaiting INR to be therapeutic INR 1.79 today No complaints Ambulating   Objective: Vital signs in last 24 hours: Temp:  [98.4 F (36.9 C)] 98.4 F (36.9 C) (09/16 0436) Pulse Rate:  [63-84] 63  (09/16 0436) Resp:  [18-20] 18  (09/16 0436) BP: (97-105)/(65-70) 97/65 mmHg (09/16 0436) SpO2:  [96 %-99 %] 98 % (09/16 0436) Last BM Date: 12/27/11  Intake/Output from previous day: 09/15 0701 - 09/16 0700 In: 480 [P.O.:480] Out: -  Intake/Output this shift:    PE:  Afeb; VSS Rt leg less red and less swollen Still has some edema noted; +sock line NT leg Good sensation; FROM  Lab Results:   [facility] 12/28/11 0520 12/27/11 0745  WBC 8.9 9.6  HGB 11.2* 11.8*  HCT 33.2* 35.6*  PLT 237 225   BMET No results found for this basename: NA:2,K:2,CL:2,CO2:2,GLUCOSE:2,BUN:2,CREATININE:2,CALCIUM:2 in the last 72 hours PT/INR  Basename 12/28/11 0520 12/27/11 0745  LABPROT 21.1* 18.1*  INR 1.79* 1.47   ABG No results found for this basename: PHART:2,PCO2:2,PO2:2,HCO3:2 in the last 72 hours  Studies/Results: No results found.  Anti-infectives: Anti-infectives    None      Assessment/Plan: s/p Rt low leg fem vein thrombolysis and angioplasty/stent 9/12-9/13 Pt has done well On hep and coumadin INR 1.79 today Will check in am Hope to dc soon  Magin Balbi A 12/28/2011

## 2011-12-28 NOTE — Progress Notes (Signed)
ANTICOAGULATION CONSULT NOTE - Follow Up Consult  Pharmacy Consult for Heparin / Coumadin Indication: DVT  Labs:  Basename 12/28/11 0520 12/27/11 0745 12/26/11 2138 12/26/11 0624  HGB 11.2* 11.8* -- --  HCT 33.2* 35.6* -- 33.0*  PLT 237 225 -- 185  APTT -- -- -- --  LABPROT 21.1* 18.1* -- 15.9*  INR 1.79* 1.47 -- 1.24  HEPARINUNFRC 0.55 0.37 0.23* --  CREATININE -- -- -- --  CKTOTAL -- -- -- --  CKMB -- -- -- --  TROPONINI -- -- -- --   Assessment: Heparin now therapeutic. INR is trending up.   Goal of Therapy:  Heparin level 0.3-0.7 units/ml INR = 2-3   Plan:  Cont heparin drip at 1900 units/hr Coumadin 7.5 mg PO x1  Thank you. Okey Regal, PharmD [phone]

## 2011-12-28 NOTE — Progress Notes (Addendum)
VASCULAR AND VEIN SURGERY PROGRESS NOTE   HPI:  Kari Rice is a 36 y.o. female right leg swelling and redness and pain.  Was seen by Dr Edilia Bo 8/14 with chronic venous insufficiency; doppler of right low leg neg.  Revisit 9/11 doppler positive for femoral vein dvt.  Procedure: 12-24-2011  1. RLE venogram  2. Partial mechanical thrombectomy  3. Initiation of overnight venous thrombolysis   Physical Examination  Patient is comfortable walking with no assistance  Palpable DP/PT pulses  Min. Edema right thigh and LE.  INR 1.79  Assessment/Planright leg swelling and redness and pain.  Procedure: 12-24-2011  1. RLE venogram  2. Partial mechanical thrombectomy  3. Initiation of overnight venous thrombolysis  Pending INR 2.0 to 3.0 prior to D/C continue heparin  Mosetta Pigeon PA-C 12/28/2011  I have examined the patient, reviewed and agree with above.  Alveda Vanhorne, MD 12/28/2011 2:56 PM

## 2011-12-29 ENCOUNTER — Telehealth: Payer: Self-pay | Admitting: Vascular Surgery

## 2011-12-29 LAB — CBC
HCT: 33.5 % — ABNORMAL LOW (ref 36.0–46.0)
MCH: 29 pg (ref 26.0–34.0)
MCHC: 33.7 g/dL (ref 30.0–36.0)
MCV: 86.1 fL (ref 78.0–100.0)
RDW: 12.6 % (ref 11.5–15.5)

## 2011-12-29 MED ORDER — WARFARIN SODIUM 7.5 MG PO TABS
7.5000 mg | ORAL_TABLET | Freq: Once | ORAL | Status: AC
  Administered 2011-12-29: 7.5 mg via ORAL
  Filled 2011-12-29: qty 1

## 2011-12-29 MED ORDER — WARFARIN SODIUM 5 MG PO TABS: 7.5000 mg | ORAL_TABLET | Freq: Every day | ORAL | Status: DC

## 2011-12-29 NOTE — Progress Notes (Signed)
Pt up ambulating in hallway independently at this time; will cont. To monitor. 

## 2011-12-29 NOTE — Telephone Encounter (Signed)
Message copied by Fredrich Birks on Tue Dec 29, 2011 11:14 AM ------      Message from: Melene Plan      Created: Tue Dec 29, 2011 10:21 AM                   ----- Message -----         From: Lars Mage, PA         Sent: 12/29/2011   7:22 AM           To: Melene Plan, RN            Right LE Thrombolytic lesion.  F/U with Dr. Edilia Bo 2-3 weeks

## 2011-12-29 NOTE — Telephone Encounter (Signed)
Spoke with mother, she will notify pt, dpm

## 2011-12-29 NOTE — Plan of Care (Signed)
Problem: Consults Goal: Diagnosis - Venous Thromboembolism (VTE) Choose a selection  Outcome: Completed/Met Date Met:  12/29/11 DVT (Deep Vein Thrombosis)

## 2011-12-29 NOTE — Discharge Summary (Signed)
[facility] Discharge Summary   Patient ID:  Kari Rice MRN: [id number] DOB/AGE: 06-21-1975 36 y.o.  Admit date: 12/24/2011 Discharge date: 12/29/2011   Admission Diagnosis: swelling of rt limb  Discharge Diagnoses:  swelling of rt limb  Secondary Diagnoses: History reviewed. No pertinent past medical history.    Discharged Condition: good  HPI: HPI:  Kari Rice is a 36 y.o. female right leg swelling and redness and pain.  Was seen by Dr Edilia Bo 8/14 with chronic venous insufficiency; doppler of right low leg neg.  Revisit 9/11 doppler positive for femoral vein dvt.  Procedure: 12-24-2011  1. RLE venogram  2. Partial mechanical thrombectomy  3. Initiation of overnight venous thrombolysis     Hospital Course:  Kari Rice is a 36 y.o. female is S/P Right  Extubated: POD # 0 Pt. Ambulating, voiding and taking PO diet without difficulty. Pt pain controlled with PO pain meds. Labs as below Complications:none  Consults:     Significant Diagnostic Studies: CBC Lab Results  Component Value Date   WBC 9.7 12/29/2011   HGB 11.3* 12/29/2011   HCT 33.5* 12/29/2011   MCV 86.1 12/29/2011   PLT 266 12/29/2011    BMET    Component Value Date/Time   NA 140 12/24/2011 0645   K 3.9 12/24/2011 0645   CL 105 12/24/2011 0645   CO2 23 12/24/2011 0645   GLUCOSE 88 12/24/2011 0645   BUN 11 12/24/2011 0645   CREATININE 0.57 12/24/2011 0645   CALCIUM 9.3 12/24/2011 0645   GFRNONAA >90 12/24/2011 0645   GFRAA >90 12/24/2011 0645   COAG Lab Results  Component Value Date   INR 2.13* 12/29/2011   INR 1.79* 12/28/2011   INR 1.47 12/27/2011     Disposition:  Discharge to :Home Discharge Orders    Future Appointments: Provider: Department: Dept Phone: Center:   02/23/2012 10:45 AM Larina Earthly, MD [facility] [phone] [facility]   06/22/2012 1:00 PM [facility] [phone] [facility]   06/22/2012 1:30 PM Chuck Hint, MD [facility]  [phone] [facility]     Future Orders Please Complete By Expires   Resume previous diet      Lifting restrictions      Comments:   No lifting for 6 weeks   Call MD for:  temperature >100.5      Call MD for:  redness, tenderness, or signs of infection (pain, swelling, bleeding, redness, odor or green/yellow discharge around incision site)      Call MD for:  severe or increased pain, loss or decreased feeling  in affected limb(s)      Increase activity slowly      Comments:   Walk with assistance use walker or cane as needed   May shower          Mira, Balon  [facility] Medication Instructions BMW:[id number]   Printed on:12/29/11 0720  Medication Information                    NON FORMULARY Support hose to the waist for dx of venous insufficiency and edema 15-20 mm Hg           warfarin (COUMADIN) 5 MG tablet Take 1.5 tablets (7.5 mg total) by mouth daily.            Verbal and written Discharge instructions given to the patient. Wound care per Discharge AVS Follow-up Information    Follow up with DICKSON,CHRISTOPHER S, MD. In 3 weeks. (sent)  Contact information:   [address] [place] [address] [phone]          Signed: [facility] 12/29/2011, 7:20 AM    12-30-2011 INR is 2.43 patient will be discharged today.

## 2011-12-29 NOTE — Progress Notes (Signed)
VASCULAR AND VEIN SURGERY PROGRESS NOTE    12/29/2011   HPI:  Kari Rice is a 36 y.o. female right leg swelling and redness and pain.  Was seen by Dr Edilia Bo 8/14 with chronic venous insufficiency; doppler of right low leg neg.  Revisit 9/11 doppler positive for femoral vein dvt.  Procedure: 12-24-2011  1. RLE venogram  2. Partial mechanical thrombectomy  3. Initiation of overnight venous thrombolysis   Physical Examination  Patient is comfortable walking with no assistance  Palpable DP/PT pulses  Min. Edema right thigh and LE.   INR 2.13  Assessment/Planright leg swelling and redness and pain.  Procedure: 12-24-2011  1. RLE venogram  2. Partial mechanical thrombectomy  3. Initiation of overnight venous thrombolysis  4. INR is 2.13 and therapeutic. D/C home she will f/u with her primary care doctor this week for INR checks. 5. F/U with Dr. Edilia Bo in 2-3 weeks.

## 2011-12-29 NOTE — Progress Notes (Signed)
Pt up ambulating in dependently in hallways at this time; will cont. To monitor.

## 2011-12-29 NOTE — Progress Notes (Signed)
ANTICOAGULATION CONSULT NOTE - Follow Up Consult  Pharmacy Consult for Heparin / Coumadin Indication: DVT  Labs:  Basename 12/29/11 0435 12/28/11 0520 12/27/11 0745  HGB 11.3* 11.2* --  HCT 33.5* 33.2* 35.6*  PLT 266 237 225  APTT -- -- --  LABPROT 24.2* 21.1* 18.1*  INR 2.13* 1.79* 1.47  HEPARINUNFRC 0.53 0.55 0.37  CREATININE -- -- --  CKTOTAL -- -- --  CKMB -- -- --  TROPONINI -- -- --   Assessment: Heparin remains therapeutic, INR has now reached the therapeutic range.  Goal of Therapy:  Heparin level 0.3-0.7 units/ml INR = 2-3   Plan:  Cont heparin drip at 1900 units/hr Coumadin 7.5 mg PO x1 Anticipate discharge on Coumadin 7.5mg  daily.  Estella Husk, Pharm.D., BCPS Clinical Pharmacist  Phone [phone] Pager [phone] 12/29/2011, 7:43 PM

## 2011-12-29 NOTE — Progress Notes (Signed)
Patient ambulating in hallway independently. No distress. Will monitor. Kari Rice

## 2011-12-30 ENCOUNTER — Telehealth: Payer: Self-pay | Admitting: *Deleted

## 2011-12-30 LAB — CBC
HCT: 34.4 % — ABNORMAL LOW (ref 36.0–46.0)
Hemoglobin: 11.5 g/dL — ABNORMAL LOW (ref 12.0–15.0)
MCH: 29.1 pg (ref 26.0–34.0)
MCHC: 33.4 g/dL (ref 30.0–36.0)

## 2011-12-30 LAB — PROTIME-INR: Prothrombin Time: 25.3 seconds — ABNORMAL HIGH (ref 11.6–15.2)

## 2011-12-30 NOTE — Progress Notes (Addendum)
      [facility]           [facility]   12-30-2011 HPI:  Kari Rice is a 36 y.o. female right leg swelling and redness and pain.  Was seen by Dr Edilia Bo 8/14 with chronic venous insufficiency; doppler of right low leg neg.  Revisit 9/11 doppler positive for femoral vein dvt.  Procedure: 12-24-2011  1. RLE venogram  2. Partial mechanical thrombectomy  3. Initiation of overnight venous thrombolysis   There is near complete resolution of the acute thrombus within the right pelvic and lower extremity deep venous system. Minimal residual thrombus in the common femoral vein was treated with an Angiojet device. A critical stenosis which was resistant to angioplasty alone at the confluence of the external and iliac vein was treated successfully with a 12 x 60 Nitinol stent. The patient will be placed on oral anticoagulation and compression stockings. Original Report Authenticated By: Donavan Burnet, M.D.   Physical Examination  Patient is comfortable walking with no assistance  Palpable DP/PT pulses  Min. Edema right thigh and LE.   Assessment/Planright leg swelling and redness and pain.  Procedure: 12-24-2011  1. RLE venogram  2. Partial mechanical thrombectomy  3. Initiation of overnight venous thrombolysis  4. INR is 2.13 and therapeutic.  D/C home she will f/u with her primary care doctor this week for INR checks.  5. F/U with Dr. Edilia Bo in 2-3 weeks. 6. The external and iliac vein was treated successfully with a 12 x 60 Nitinol stent.  7. Discharge home on coumadin 7.5 daily and follow up with primary care for INR adjustments as needed.  Micaylah Bertucci MAUREEN PA-C

## 2011-12-30 NOTE — Telephone Encounter (Signed)
Thanks for letting me know she is home - I hope she is feeling better and let me know if she is not  I would love to see her Friday or Monday- and do her protime/ INR for coumadin  Please schedule (I prefer Friday if possible)

## 2011-12-30 NOTE — Telephone Encounter (Signed)
Pt wanted you to know she has been in the hospital, she is back at home and wants to know if you want her to make a hospital follow up appt with you and if so how soon should she come in

## 2011-12-31 ENCOUNTER — Ambulatory Visit (INDEPENDENT_AMBULATORY_CARE_PROVIDER_SITE_OTHER): Payer: 59 | Admitting: Family Medicine

## 2011-12-31 ENCOUNTER — Other Ambulatory Visit: Payer: Self-pay | Admitting: Interventional Radiology

## 2011-12-31 DIAGNOSIS — I82409 Acute embolism and thrombosis of unspecified deep veins of unspecified lower extremity: Secondary | ICD-10-CM

## 2011-12-31 DIAGNOSIS — I82419 Acute embolism and thrombosis of unspecified femoral vein: Secondary | ICD-10-CM

## 2011-12-31 DIAGNOSIS — Z95828 Presence of other vascular implants and grafts: Secondary | ICD-10-CM

## 2011-12-31 DIAGNOSIS — Z5181 Encounter for therapeutic drug level monitoring: Secondary | ICD-10-CM

## 2011-12-31 DIAGNOSIS — I824Y9 Acute embolism and thrombosis of unspecified deep veins of unspecified proximal lower extremity: Secondary | ICD-10-CM

## 2011-12-31 DIAGNOSIS — Z7901 Long term (current) use of anticoagulants: Secondary | ICD-10-CM

## 2011-12-31 LAB — POCT INR: INR: 4.4

## 2011-12-31 NOTE — Telephone Encounter (Signed)
Pt had a coumadin lab appt. So while she was here she schedule an appt for a hospital follow up with you on Monday 01/04/12

## 2011-12-31 NOTE — Telephone Encounter (Signed)
That is great

## 2011-12-31 NOTE — Patient Instructions (Signed)
Hold Coumadin dose Thursday and Friday. Take 5 mg Sat/Sun, re check Monday 01-04-12

## 2012-01-04 ENCOUNTER — Encounter: Payer: Self-pay | Admitting: Family Medicine

## 2012-01-04 ENCOUNTER — Ambulatory Visit (INDEPENDENT_AMBULATORY_CARE_PROVIDER_SITE_OTHER): Payer: 59 | Admitting: Family Medicine

## 2012-01-04 ENCOUNTER — Ambulatory Visit: Payer: 59

## 2012-01-04 VITALS — BP 136/72 | HR 92 | Temp 98.0°F | Ht 64.0 in | Wt 210.2 lb

## 2012-01-04 DIAGNOSIS — I82419 Acute embolism and thrombosis of unspecified femoral vein: Secondary | ICD-10-CM

## 2012-01-04 DIAGNOSIS — I82409 Acute embolism and thrombosis of unspecified deep veins of unspecified lower extremity: Secondary | ICD-10-CM

## 2012-01-04 DIAGNOSIS — Z5181 Encounter for therapeutic drug level monitoring: Secondary | ICD-10-CM

## 2012-01-04 DIAGNOSIS — I872 Venous insufficiency (chronic) (peripheral): Secondary | ICD-10-CM

## 2012-01-04 DIAGNOSIS — Z7901 Long term (current) use of anticoagulants: Secondary | ICD-10-CM

## 2012-01-04 DIAGNOSIS — I824Y9 Acute embolism and thrombosis of unspecified deep veins of unspecified proximal lower extremity: Secondary | ICD-10-CM

## 2012-01-04 LAB — POCT INR: INR: 2.5

## 2012-01-04 NOTE — Discharge Summary (Signed)
Agree with plans for discharge  Di Kindle. Edilia Bo, MD, FACS [facility] [phone] 01/04/2012

## 2012-01-04 NOTE — Progress Notes (Signed)
Subjective:    Patient ID: Kari Rice, female    DOB: [birth year], 36 y.o.   MRN: [id number]  HPI Here for hosp f/u for DVT  Has seen Dr Edilia Bo for varicose veins and was dx with fem vein  DVT on 9/11 hosp for thombolysis/ venogram and then thrombectomy  Feels a lot better  Wearing support stockings - when she works  Takes off when she is off her feet and elevates it   Lost weight also 10 lb -- poss from swelling   No chest pain or sob - feels ok   Goal INR ia 2-3 Last INR 9/17 was tx at 2.13 Then last Thursday- was 4.4 -- held coumadin for 2 days - and now on 5 mg daily , not on any lovenox  Will aim for 6 months   Patient Active Problem List  Diagnosis  . TRICHOMONAL VAGINITIS  . UNSPECIFIED VAGINITIS AND VULVOVAGINITIS  . IRREGULAR MENSES  . NECK PAIN  . ELEVATED BP W/O HYPERTENSION  . CONTUSION, CHEST WALL  . Pain in joint, lower leg  . Venous insufficiency, peripheral  . Pedal edema  . Phlebitis and thrombophlebitis of the leg  . Venous insufficiency  . Cyst of skin  . Unspecified venous (peripheral) insufficiency  . Deep venous thrombosis of femoral vein   No past medical history on file. Past Surgical History  Procedure Date  . Cholecystectomy     2006   History  Substance Use Topics  . Smoking status: Never Smoker   . Smokeless tobacco: Never Used  . Alcohol Use: No   Family History  Problem Relation Age of Onset  . Arthritis Mother   . Heart disease Father    Allergies  Allergen Reactions  . Penicillins Rash  . Sulfa Antibiotics Rash   Current Outpatient Prescriptions on File Prior to Visit  Medication Sig Dispense Refill  . NON FORMULARY Support hose to the waist for dx of venous insufficiency and edema 15-20 mm Hg      . warfarin (COUMADIN) 5 MG tablet Take 1.5 tablets (7.5 mg total) by mouth daily.  60 tablet  0       Review of Systems Review of Systems  Constitutional: Negative for fever, appetite change, fatigue and unexpected  weight change.  Eyes: Negative for pain and visual disturbance.  Respiratory: Negative for cough and shortness of breath.   Cardiovascular: Negative for cp or palpitations    Gastrointestinal: Negative for nausea, diarrhea and constipation.  Genitourinary: Negative for urgency and frequency.  Skin: Negative for pallor or rash   Neurological: Negative for weakness, light-headedness, numbness and headaches.  Hematological: Negative for adenopathy. Does not bruise/bleed easily.  Psychiatric/Behavioral: Negative for dysphoric mood. The patient is not nervous/anxious.         Objective:   Physical Exam  Constitutional: She appears well-developed and well-nourished. No distress.       obese and well appearing   HENT:  Head: Normocephalic and atraumatic.  Mouth/Throat: Oropharynx is clear and moist.  Eyes: Conjunctivae normal and EOM are normal. Pupils are equal, round, and reactive to light. No scleral icterus.  Neck: Normal range of motion. Neck supple. No thyromegaly present.  Cardiovascular: Normal rate, regular rhythm and normal heart sounds.   Pulmonary/Chest: Effort normal and breath sounds normal. No respiratory distress. She has no wheezes.  Abdominal: Soft.  Musculoskeletal: She exhibits edema. She exhibits no tenderness.       R leg has large compressible varicose veins to  the thigh  Site of thrombectomy looks nl and well healed No erythema or warmth or palpable cord  Nl rom leg Neg homann's sign  Lymphadenopathy:    She has no cervical adenopathy.  Neurological: She is alert. She has normal reflexes. No cranial nerve deficit. She exhibits normal muscle tone. Coordination normal.  Skin: Skin is warm and dry. No rash noted. No erythema. No pallor.  Psychiatric: She has a normal mood and affect.          Assessment & Plan:

## 2012-01-04 NOTE — Patient Instructions (Signed)
5 mg daily, except 7.5 mg every Tue/Thur. Check in 1 week

## 2012-01-04 NOTE — Patient Instructions (Addendum)
I'm glad you are doing well  Elevate legs when you can, keep working on weight loss for your veins  Do not take anti inflammatories on coumadin Wear your support hose while working  INR today and we will determine follow up

## 2012-01-04 NOTE — Assessment & Plan Note (Signed)
Ongoing Wearing supp hose and elevating legs  Under care of vascular On coumadin for DVT also

## 2012-01-04 NOTE — Assessment & Plan Note (Addendum)
In femoral vein s/p thrombolysis/ thrombectomy Doing very well Rev hospital notes and studied in detail with pt   INR today -on 5 mg daily (last was over 4 and held 2 doses) Re eval 6 mo  For vasc f/u soon

## 2012-01-11 ENCOUNTER — Ambulatory Visit (INDEPENDENT_AMBULATORY_CARE_PROVIDER_SITE_OTHER): Payer: 59 | Admitting: Family Medicine

## 2012-01-11 DIAGNOSIS — Z5181 Encounter for therapeutic drug level monitoring: Secondary | ICD-10-CM

## 2012-01-11 DIAGNOSIS — I824Y9 Acute embolism and thrombosis of unspecified deep veins of unspecified proximal lower extremity: Secondary | ICD-10-CM

## 2012-01-11 DIAGNOSIS — I82409 Acute embolism and thrombosis of unspecified deep veins of unspecified lower extremity: Secondary | ICD-10-CM

## 2012-01-11 DIAGNOSIS — I82419 Acute embolism and thrombosis of unspecified femoral vein: Secondary | ICD-10-CM

## 2012-01-11 DIAGNOSIS — Z7901 Long term (current) use of anticoagulants: Secondary | ICD-10-CM

## 2012-01-11 NOTE — Patient Instructions (Signed)
Hold coumadin for 2 days then recheck Wed

## 2012-01-12 ENCOUNTER — Encounter: Payer: Self-pay | Admitting: Vascular Surgery

## 2012-01-13 ENCOUNTER — Encounter: Payer: Self-pay | Admitting: Vascular Surgery

## 2012-01-13 ENCOUNTER — Ambulatory Visit (INDEPENDENT_AMBULATORY_CARE_PROVIDER_SITE_OTHER): Payer: 59 | Admitting: Vascular Surgery

## 2012-01-13 ENCOUNTER — Ambulatory Visit (INDEPENDENT_AMBULATORY_CARE_PROVIDER_SITE_OTHER): Payer: 59 | Admitting: Family Medicine

## 2012-01-13 VITALS — BP 120/75 | HR 101 | Resp 16 | Ht 64.0 in | Wt 211.0 lb

## 2012-01-13 DIAGNOSIS — I83893 Varicose veins of bilateral lower extremities with other complications: Secondary | ICD-10-CM

## 2012-01-13 DIAGNOSIS — I824Y9 Acute embolism and thrombosis of unspecified deep veins of unspecified proximal lower extremity: Secondary | ICD-10-CM

## 2012-01-13 DIAGNOSIS — Z86718 Personal history of other venous thrombosis and embolism: Secondary | ICD-10-CM

## 2012-01-13 DIAGNOSIS — I825Y9 Chronic embolism and thrombosis of unspecified deep veins of unspecified proximal lower extremity: Secondary | ICD-10-CM | POA: Insufficient documentation

## 2012-01-13 DIAGNOSIS — Z7901 Long term (current) use of anticoagulants: Secondary | ICD-10-CM

## 2012-01-13 DIAGNOSIS — I82409 Acute embolism and thrombosis of unspecified deep veins of unspecified lower extremity: Secondary | ICD-10-CM

## 2012-01-13 DIAGNOSIS — Z5181 Encounter for therapeutic drug level monitoring: Secondary | ICD-10-CM

## 2012-01-13 DIAGNOSIS — I82419 Acute embolism and thrombosis of unspecified femoral vein: Secondary | ICD-10-CM

## 2012-01-13 LAB — POCT INR: INR: 1.8

## 2012-01-13 NOTE — Patient Instructions (Signed)
Decrease to 5 mg daily, recheck 2 weeks 

## 2012-01-13 NOTE — Progress Notes (Signed)
[facility]  Patient name: Kari Rice MRN: [id number] DOB: July 05, 1975 Sex: female  REASON FOR VISIT: follow up after thrombolysis of extensive right lower extremity DVT.  HPI: Kari Rice is a 36 y.o. female who I saw in the office on 12/23/2011. At that time she had significant right lower extremity swelling. Venous duplex scan showed partially occlusive thrombus in the right common femoral vein and occlusive thrombus in the right femoral vein and also the right greater saphenous vein. She was admitted the following day and underwent successful thrombolysis. She had near complete resolution of the acute thrombus within the right pelvis and right lower extremity deep venous system. There was a small amount of residual thrombus in the common femoral vein which was treated with an AngioJet. 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 She has been on Coumadin. He comes in for a follow up visit.  Her leg swelling has improved significantly. She is ablating without difficulty. She has no specific complaints. She has had no shortness of breath or pleuritic chest pain.   REVIEW OF SYSTEMS: Arly.Keller ] denotes positive finding; [  ] denotes negative finding  CARDIOVASCULAR:  [ ]  chest pain   [ ]  dyspnea on exertion    CONSTITUTIONAL:  [ ]  fever   [ ]  chills  PHYSICAL EXAM: Filed Vitals:   01/13/12 1334  BP: 120/75  Pulse: 101  Resp: 16  Height: 5\' 4"  (1.626 m)  Weight: 211 lb (95.709 kg)  SpO2: 100%   Body mass index is 36.22 kg/(m^2). GENERAL: The patient is a well-nourished female, in no acute distress. The vital signs are documented above. CARDIOVASCULAR: There is a regular rate and rhythm  PULMONARY: There is good air exchange bilaterally without wheezing or rales. The right lower extremity swelling has improved significantly. 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  MEDICAL  ISSUES: This patient has undergone successful thrombolysis of her right lower extremity DVT. I would recommend 6 months of Coumadin. This is being followed by Dr. Milinda Antis. I have ordered a follow up duplex scan in 6 months and we will see her back at that time. While she was in the hospital, Dr. Arbie Cookey rounded on her and had discussed potentially addressing the large varicosities of her right leg in the future once she had recovered from this. Depending upon the results of her follow up duplex in 6 months we can consider further treatment of her larger varicosities. She knows to call sooner if she has problems.  Kari Rice S [facility] [place] [facility]: [phone]

## 2012-01-14 NOTE — Addendum Note (Signed)
Addended by: Sharee Pimple on: 01/14/2012 11:23 AM   Modules accepted: Orders

## 2012-01-21 ENCOUNTER — Other Ambulatory Visit: Payer: Self-pay | Admitting: Interventional Radiology

## 2012-01-21 ENCOUNTER — Ambulatory Visit
Admission: RE | Admit: 2012-01-21 | Discharge: 2012-01-21 | Disposition: A | Discharge status: Home / Self Care | Payer: 59 | Source: Ambulatory Visit | Attending: Interventional Radiology | Admitting: Interventional Radiology

## 2012-01-21 DIAGNOSIS — Z95828 Presence of other vascular implants and grafts: Secondary | ICD-10-CM

## 2012-01-21 DIAGNOSIS — I82409 Acute embolism and thrombosis of unspecified deep veins of unspecified lower extremity: Secondary | ICD-10-CM

## 2012-01-21 NOTE — Progress Notes (Signed)
Pt states that she has been wearing thigh high graduated compression garment on RLE QD during waking hours.  Denies pain.  States that edema is improving.    Per patient:  Coumadin & INR managed by Dr Milinda Antis.  Coumadin was dec'd from 7.5 mg QD to 5 mg QD last week.  Given Rx for graduated compression garment 20-30 mm Hg (thigh high or panty hose).

## 2012-01-27 ENCOUNTER — Ambulatory Visit: Payer: 59

## 2012-01-28 ENCOUNTER — Ambulatory Visit (INDEPENDENT_AMBULATORY_CARE_PROVIDER_SITE_OTHER): Payer: 59 | Admitting: Family Medicine

## 2012-01-28 DIAGNOSIS — I82419 Acute embolism and thrombosis of unspecified femoral vein: Secondary | ICD-10-CM

## 2012-01-28 DIAGNOSIS — I82409 Acute embolism and thrombosis of unspecified deep veins of unspecified lower extremity: Secondary | ICD-10-CM

## 2012-01-28 DIAGNOSIS — Z7901 Long term (current) use of anticoagulants: Secondary | ICD-10-CM

## 2012-01-28 DIAGNOSIS — Z5181 Encounter for therapeutic drug level monitoring: Secondary | ICD-10-CM

## 2012-01-28 DIAGNOSIS — I824Y9 Acute embolism and thrombosis of unspecified deep veins of unspecified proximal lower extremity: Secondary | ICD-10-CM

## 2012-01-28 LAB — POCT INR: INR: 2.7

## 2012-01-28 NOTE — Patient Instructions (Signed)
Continue current dose, check in 4 weeks  

## 2012-02-22 ENCOUNTER — Encounter: Payer: Self-pay | Admitting: Family Medicine

## 2012-02-22 ENCOUNTER — Ambulatory Visit (INDEPENDENT_AMBULATORY_CARE_PROVIDER_SITE_OTHER): Payer: 59 | Admitting: Family Medicine

## 2012-02-22 VITALS — BP 114/78 | HR 76 | Temp 98.3°F | Ht 64.0 in | Wt 208.2 lb

## 2012-02-22 DIAGNOSIS — L729 Follicular cyst of the skin and subcutaneous tissue, unspecified: Secondary | ICD-10-CM

## 2012-02-22 DIAGNOSIS — L723 Sebaceous cyst: Secondary | ICD-10-CM

## 2012-02-22 MED ORDER — DOXYCYCLINE HYCLATE 100 MG PO TABS: 100.0000 mg | ORAL_TABLET | Freq: Two times a day (BID) | ORAL | Status: DC

## 2012-02-22 NOTE — Assessment & Plan Note (Signed)
R axillae 2 - one is resolving/ neither actively draining With hx of mrsa will tx  Doxycycline (sulfa all)- aware this will inc her INR- will have that checked on Thursday Update if not starting to improve in a several days or if worsening

## 2012-02-22 NOTE — Patient Instructions (Addendum)
Keep underarm area clean  Can swab the cysts with rubbing alcohol Use warm compress whenever you can  Do not shave until this is better  Take the doxycycline as directed  This medicine may increase your protime- get checked on Thursday as planned

## 2012-02-22 NOTE — Progress Notes (Signed)
  Subjective:    Patient ID: Kari Rice, female    DOB: Dec 11, 1975, 36 y.o.   MRN: [id number]  HPI Here for a sore under her R arm/ in axillae First bump- 1.5 weeks- getting better - then another spot popped up under it  Did pop it - a lot of pus and blood came out Has had mrsa before so we are cautious Feels ok - no fever   Patient Active Problem List  Diagnosis  . TRICHOMONAL VAGINITIS  . UNSPECIFIED VAGINITIS AND VULVOVAGINITIS  . IRREGULAR MENSES  . NECK PAIN  . ELEVATED BP W/O HYPERTENSION  . CONTUSION, CHEST WALL  . Pain in joint, lower leg  . Venous insufficiency, peripheral  . Pedal edema  . Phlebitis and thrombophlebitis of the leg  . Venous insufficiency  . Cyst of skin  . Unspecified venous (peripheral) insufficiency  . Deep venous thrombosis of femoral vein  . 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 Acute venous embolism and thrombosis of deep vessels of proximal lower extremity   No past medical history on file. Past Surgical History  Procedure Date  . Cholecystectomy     2006  . Thrombectomy    History  Substance Use Topics  . Smoking status: Never Smoker   . Smokeless tobacco: Never Used  . Alcohol Use: No   Family History  Problem Relation Age of Onset  . Arthritis Mother   . Heart disease Father    Allergies  Allergen Reactions  . Penicillins Rash  . Sulfa Antibiotics Rash   Current Outpatient Prescriptions on File Prior to Visit  Medication Sig Dispense Refill  . NON FORMULARY Support hose to the waist for dx of venous insufficiency and edema 15-20 mm Hg      . warfarin (COUMADIN) 5 MG tablet Take 1.5 tablets (7.5 mg total) by mouth daily.  60 tablet  0      Review of Systems Review of Systems  Constitutional: Negative for fever, appetite change, fatigue and unexpected weight change.  Eyes: Negative for pain and visual disturbance.  Respiratory: Negative for cough and shortness of breath.    Cardiovascular: Negative for cp or palpitations    Gastrointestinal: Negative for nausea, diarrhea and constipation.  Genitourinary: Negative for urgency and frequency.  Skin: Negative for pallor or rash  neg for itching  Neurological: Negative for weakness, light-headedness, numbness and headaches.  Hematological: Negative for adenopathy. Does not bruise/bleed easily.  Psychiatric/Behavioral: Negative for dysphoric mood. The patient is not nervous/anxious.         Objective:   Physical Exam  Constitutional: She appears well-developed and well-nourished. No distress.  HENT:  Head: Normocephalic and atraumatic.  Mouth/Throat: Oropharynx is clear and moist.  Eyes: Conjunctivae normal are normal. Pupils are equal, round, and reactive to light. Right eye exhibits no discharge. Left eye exhibits no discharge.  Neck: Normal range of motion. Neck supple.  Cardiovascular: Normal rate and regular rhythm.   Lymphadenopathy:    She has no cervical adenopathy.  Skin: Skin is warm and dry. No rash noted. There is erythema. No pallor.       2 superficial cysts in R axilla- one with scab and other is firm  No active drainage Pink in color slt tender No adenopathy  Psychiatric: She has a normal mood and affect.          Assessment & Plan:

## 2012-02-23 ENCOUNTER — Ambulatory Visit: Payer: 59 | Admitting: Vascular Surgery

## 2012-02-25 ENCOUNTER — Ambulatory Visit (INDEPENDENT_AMBULATORY_CARE_PROVIDER_SITE_OTHER): Payer: 59 | Admitting: Internal Medicine

## 2012-02-25 DIAGNOSIS — Z5181 Encounter for therapeutic drug level monitoring: Secondary | ICD-10-CM

## 2012-02-25 DIAGNOSIS — I824Y9 Acute embolism and thrombosis of unspecified deep veins of unspecified proximal lower extremity: Secondary | ICD-10-CM

## 2012-02-25 DIAGNOSIS — I82409 Acute embolism and thrombosis of unspecified deep veins of unspecified lower extremity: Secondary | ICD-10-CM

## 2012-02-25 DIAGNOSIS — I82419 Acute embolism and thrombosis of unspecified femoral vein: Secondary | ICD-10-CM

## 2012-02-25 DIAGNOSIS — Z7901 Long term (current) use of anticoagulants: Secondary | ICD-10-CM

## 2012-02-25 NOTE — Patient Instructions (Signed)
Continue current dose, check in 4 weeks  

## 2012-03-24 ENCOUNTER — Ambulatory Visit: Payer: 59

## 2012-03-29 ENCOUNTER — Ambulatory Visit (INDEPENDENT_AMBULATORY_CARE_PROVIDER_SITE_OTHER): Payer: 59 | Admitting: General Practice

## 2012-03-29 DIAGNOSIS — I824Y9 Acute embolism and thrombosis of unspecified deep veins of unspecified proximal lower extremity: Secondary | ICD-10-CM

## 2012-03-29 DIAGNOSIS — I82419 Acute embolism and thrombosis of unspecified femoral vein: Secondary | ICD-10-CM

## 2012-05-02 ENCOUNTER — Telehealth: Payer: Self-pay

## 2012-05-02 MED ORDER — WARFARIN SODIUM 5 MG PO TABS: 5.0000 mg | ORAL_TABLET | Freq: Every day | ORAL | Status: DC

## 2012-05-02 NOTE — Telephone Encounter (Addendum)
Pt left v/m requesting call back; pt trying to reach vein and vascular about refill on Warfarin. No other message or pharmacy. Left v/m for pt to call back. Pt called back and is checking [facility].pt still unable to reach vein and vascular. Refill warfarin 5 mg to [facility].

## 2012-05-09 ENCOUNTER — Ambulatory Visit (INDEPENDENT_AMBULATORY_CARE_PROVIDER_SITE_OTHER): Payer: 59 | Admitting: General Practice

## 2012-05-09 ENCOUNTER — Ambulatory Visit: Payer: 59

## 2012-05-09 ENCOUNTER — Encounter: Payer: Self-pay | Admitting: Family Medicine

## 2012-05-09 ENCOUNTER — Ambulatory Visit (INDEPENDENT_AMBULATORY_CARE_PROVIDER_SITE_OTHER): Payer: 59 | Admitting: Family Medicine

## 2012-05-09 VITALS — BP 118/76 | HR 80 | Temp 98.5°F | Ht 64.0 in | Wt 211.5 lb

## 2012-05-09 DIAGNOSIS — I82419 Acute embolism and thrombosis of unspecified femoral vein: Secondary | ICD-10-CM

## 2012-05-09 DIAGNOSIS — L089 Local infection of the skin and subcutaneous tissue, unspecified: Secondary | ICD-10-CM | POA: Insufficient documentation

## 2012-05-09 DIAGNOSIS — I824Y9 Acute embolism and thrombosis of unspecified deep veins of unspecified proximal lower extremity: Secondary | ICD-10-CM

## 2012-05-09 DIAGNOSIS — L729 Follicular cyst of the skin and subcutaneous tissue, unspecified: Secondary | ICD-10-CM | POA: Insufficient documentation

## 2012-05-09 DIAGNOSIS — M79604 Pain in right leg: Secondary | ICD-10-CM | POA: Insufficient documentation

## 2012-05-09 DIAGNOSIS — L723 Sebaceous cyst: Secondary | ICD-10-CM

## 2012-05-09 DIAGNOSIS — M79609 Pain in unspecified limb: Secondary | ICD-10-CM

## 2012-05-09 NOTE — Progress Notes (Signed)
Subjective:    Patient ID: Kari Rice, female    DOB: 01-16-1976, 37 y.o.   MRN: [id number]  HPI 16 y o with hx of venous insufficiency/ and hx of DVT with thromolysis(thrombectomy) - femoral vein, on coumadin (6 mo) with INR of 2.3 today  Presents with R leg pain  Friday was hurting her pretty badly - mostly in the groin area down to mid thigh Is in the site where the stent is  This comes and goes - usually around menses - but not this time Tylenol for pain  Heating pad on it at night  Is much better today   No redness or swelling - just more of a dull pain  Can work/ walk through it (not painful to walk on it)  Does not feel like she has an infection She has been watching her temp    Sees Dr Edilia Bo for vasc surg-- sees him in march  Plan was to get 6 mo doppler and then consider eval for vein procedure from Dr Arbie Cookey , but then interventional radiologist said 1 year and ? If will be operable   Cyst under right arm (axilla) also acting up  Is draining pus  Sore  Uses bandaid  No warm compresses   Patient Active Problem List  Diagnosis  . TRICHOMONAL VAGINITIS  . UNSPECIFIED VAGINITIS AND VULVOVAGINITIS  . IRREGULAR MENSES  . NECK PAIN  . ELEVATED BP W/O HYPERTENSION  . CONTUSION, CHEST WALL  . Pain in joint, lower leg  . Venous insufficiency, peripheral  . Pedal edema  . Phlebitis and thrombophlebitis of the leg  . Venous insufficiency  . Cyst of skin  . Unspecified venous (peripheral) insufficiency  . Deep venous thrombosis of femoral vein  . 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 Acute venous embolism and thrombosis of deep vessels of proximal lower extremity   No past medical history on file. Past Surgical History  Procedure Date  . Cholecystectomy     2006  . Thrombectomy    History  Substance Use Topics  . Smoking status: Never Smoker   . Smokeless tobacco: Never Used  . Alcohol Use: No   Family  History  Problem Relation Age of Onset  . Arthritis Mother   . Heart disease Father    Allergies  Allergen Reactions  . Penicillins Rash  . Sulfa Antibiotics Rash   Current Outpatient Prescriptions on File Prior to Visit  Medication Sig Dispense Refill  . NON FORMULARY Support hose to the waist for dx of venous insufficiency and edema 15-20 mm Hg      . warfarin (COUMADIN) 5 MG tablet Take 1 tablet (5 mg total) by mouth daily.  60 tablet  3       Review of Systems Review of Systems  Constitutional: Negative for fever, appetite change, fatigue and unexpected weight change.  Eyes: Negative for pain and visual disturbance.  Respiratory: Negative for cough and shortness of breath.   Cardiovascular: Negative for cp or palpitations   pos for leg pain at site of thrombosis- that is better now , with resolved swelling  Gastrointestinal: Negative for nausea, diarrhea and constipation.  Genitourinary: Negative for urgency and frequency.  Skin: Negative for pallor or rash  pos for cyst under arm that is draining pus (recurrent) Neurological: Negative for weakness, light-headedness, numbness and headaches.  Hematological: Negative for adenopathy. Does not bruise/bleed easily.  Psychiatric/Behavioral: Negative for dysphoric mood. The patient  is not nervous/anxious.         Objective:   Physical Exam  Constitutional: She appears well-developed and well-nourished. No distress.  HENT:  Head: Normocephalic and atraumatic.  Mouth/Throat: Oropharynx is clear and moist.  Eyes: Conjunctivae normal and EOM are normal. Pupils are equal, round, and reactive to light. No scleral icterus.  Neck: Normal range of motion. Neck supple.  Cardiovascular: Normal rate, regular rhythm, normal heart sounds and intact distal pulses.  Exam reveals no gallop.   Pulmonary/Chest: Breath sounds normal. No respiratory distress.  Abdominal: Soft. Bowel sounds are normal. She exhibits no distension and no mass. There  is no tenderness.  Musculoskeletal: She exhibits no edema and no tenderness.       Today non tender over the R proximal thigh where she was previously tender No mass or swelling or warmth noted Neg homans sign  Lymphadenopathy:    She has no cervical adenopathy.  Neurological: She is alert. She has normal reflexes.  Skin: Skin is warm and dry. No rash noted. No erythema.       Cyst in L axilla - 1 cm erythematous and actively draining with pus expressed, mildly tender  Psychiatric: She has a normal mood and affect.          Assessment & Plan:

## 2012-05-09 NOTE — Patient Instructions (Addendum)
Keep cyst under arm clean and use antibiotic ointment Warm compresses will encourage it to drain also  We will send specimen for culture and update you  If worse let me know  We will set up venous doppler of leg at check out  Try the waist high support stockings

## 2012-05-09 NOTE — Assessment & Plan Note (Signed)
L axilla Specimen sent for cx- pus easily expressed-pend result (recurrent - suspect hydraadenitis) Will keep clean and use warm compresses

## 2012-05-09 NOTE — Assessment & Plan Note (Signed)
With hx of thrombectomy- now having intermittent pain that is significant in that area  Will re check doppler INR is theraputic  Has vasc f/u in march Suspect her thigh high hose may not go up far enough and may cause pressure over femoral vein- written new px for waist high hose

## 2012-05-12 ENCOUNTER — Encounter (INDEPENDENT_AMBULATORY_CARE_PROVIDER_SITE_OTHER): Payer: 59

## 2012-05-12 DIAGNOSIS — M79604 Pain in right leg: Secondary | ICD-10-CM

## 2012-05-12 DIAGNOSIS — I82419 Acute embolism and thrombosis of unspecified femoral vein: Secondary | ICD-10-CM

## 2012-05-12 DIAGNOSIS — I8010 Phlebitis and thrombophlebitis of unspecified femoral vein: Secondary | ICD-10-CM

## 2012-05-12 DIAGNOSIS — M79609 Pain in unspecified limb: Secondary | ICD-10-CM

## 2012-05-12 LAB — WOUND CULTURE: Gram Stain: NONE SEEN

## 2012-05-17 ENCOUNTER — Telehealth: Payer: Self-pay | Admitting: Family Medicine

## 2012-06-03 ENCOUNTER — Encounter: Payer: Self-pay | Admitting: Family Medicine

## 2012-06-03 ENCOUNTER — Ambulatory Visit (INDEPENDENT_AMBULATORY_CARE_PROVIDER_SITE_OTHER): Payer: 59 | Admitting: Family Medicine

## 2012-06-03 VITALS — BP 116/86 | HR 82 | Temp 98.2°F | Ht 64.0 in | Wt 211.5 lb

## 2012-06-03 MED ORDER — FLUTICASONE PROPIONATE 50 MCG/ACT NA SUSP: 2.0000 | Freq: Every day | NASAL | Status: DC

## 2012-06-03 MED ORDER — TRAMADOL HCL 50 MG PO TABS: 50.0000 mg | ORAL_TABLET | Freq: Three times a day (TID) | ORAL | Status: DC | PRN

## 2012-06-03 NOTE — Progress Notes (Signed)
Subjective:    Patient ID: Kari Rice, female    DOB: 1975-08-02, 37 y.o.   MRN: [id number]  HPI She has a headache for 4-5 days Started around her R ear and deep inside- then moved up into scalp- is tender to the touch (by brush) No rash that he can see or feel  Is throbbing and dull  No change with exertion No n/v  Some sensitivity to light (not sound)  No vision change and no aura   No nasal congestion No dizziness  No st  No prior hx of migraine   No head trauma at all   Brother has migraine with aura - runs in the family   Weather has been strange    Tylenol does not help She cannot take any nsaid due to the coumadin   Patient Active Problem List  Diagnosis  . TRICHOMONAL VAGINITIS  . UNSPECIFIED VAGINITIS AND VULVOVAGINITIS  . IRREGULAR MENSES  . NECK PAIN  . ELEVATED BP W/O HYPERTENSION  . CONTUSION, CHEST WALL  . Pain in joint, lower leg  . Venous insufficiency, peripheral  . Pedal edema  . Phlebitis and thrombophlebitis of the leg  . Venous insufficiency  . Cyst of skin  . Unspecified venous (peripheral) insufficiency  . Deep venous thrombosis of femoral vein  . 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 Acute venous embolism and thrombosis of deep vessels of proximal lower extremity  . Right leg pain  . Infected cyst of skin   No past medical history on file. Past Surgical History  Procedure Laterality Date  . Cholecystectomy      2006  . Thrombectomy     History  Substance Use Topics  . Smoking status: Never Smoker   . Smokeless tobacco: Never Used  . Alcohol Use: No   Family History  Problem Relation Age of Onset  . Arthritis Mother   . Heart disease Father    Allergies  Allergen Reactions  . Penicillins Rash  . Sulfa Antibiotics Rash   Current Outpatient Prescriptions on File Prior to Visit  Medication Sig Dispense Refill  . NON FORMULARY Support hose to the waist for dx of venous  insufficiency and edema 15-20 mm Hg      . warfarin (COUMADIN) 5 MG tablet Take 1 tablet (5 mg total) by mouth daily.  60 tablet  3   No current facility-administered medications on file prior to visit.    Review of Systems Review of Systems  Constitutional: Negative for fever, appetite change, fatigue and unexpected weight change.  Eyes: Negative for pain and visual disturbance.  ENt pos for some congestion and drainage-mild/neg for ST or ear drainage Respiratory: Negative for cough and shortness of breath.   Cardiovascular: Negative for cp or palpitations    Gastrointestinal: Negative for nausea, diarrhea and constipation.  Genitourinary: Negative for urgency and frequency.  Skin: Negative for pallor or rash   Neurological: Negative for weakness, light-headedness, numbness and pos for  headaches.  Hematological: Negative for adenopathy. Does not bruise/bleed easily.  Psychiatric/Behavioral: Negative for dysphoric mood. The patient is not nervous/anxious.         Objective:   Physical Exam  Constitutional: She appears well-developed and well-nourished. No distress.  HENT:  Head: Normocephalic and atraumatic.  Right Ear: External ear normal.  Left Ear: External ear normal.  Mouth/Throat: Oropharynx is clear and moist. No oropharyngeal exudate.  Nares are pale/ boggy and somewhat swollen  TMs  clear  Some clear post nasal drip  Eyes: Conjunctivae and EOM are normal. Pupils are equal, round, and reactive to light. Right eye exhibits no discharge. No scleral icterus.  Neck: Normal range of motion. Neck supple. No JVD present. Carotid bruit is not present. No thyromegaly present.  Cardiovascular: Normal rate, regular rhythm, normal heart sounds and intact distal pulses.   No murmur heard. Pulmonary/Chest: Effort normal and breath sounds normal. No respiratory distress. She has no wheezes.  Abdominal: Soft. Bowel sounds are normal. She exhibits no distension, no abdominal bruit and no  mass. There is no tenderness.  Musculoskeletal: She exhibits no edema and no tenderness.  Lymphadenopathy:    She has no cervical adenopathy.  Neurological: She is alert. She has normal strength and normal reflexes. She displays no atrophy and no tremor. No cranial nerve deficit or sensory deficit. She exhibits normal muscle tone. She displays a negative Romberg sign. Coordination and gait normal.  No focal cerebellar signs No facial droop or tenderness  Skin: Skin is warm and dry. No rash noted. No erythema. No pallor.  No rash in scalp or face  Psychiatric: She has a normal mood and affect.          Assessment & Plan:

## 2012-06-03 NOTE — Assessment & Plan Note (Signed)
Acute and poss multifactorial Is at risk for migraine with family history  No nsaid due to coumadin  Nl exam Suspect allergies play a role-flonase px for 2 wk  Also ultam for pain  Update if not starting to improve in a week or if worsening   If worse will update or go to er if severe

## 2012-06-03 NOTE — Patient Instructions (Addendum)
For headache - use flonase nasal spray for 2 weeks to open up sinuses and ear tubes Try cold compress on side of head that hurts  Try tramadol-watch out for sedation  If no improvement by early next week- let me know  If worse or sever - go to ER

## 2012-06-20 ENCOUNTER — Ambulatory Visit (INDEPENDENT_AMBULATORY_CARE_PROVIDER_SITE_OTHER): Payer: 59 | Admitting: General Practice

## 2012-06-20 DIAGNOSIS — Z5181 Encounter for therapeutic drug level monitoring: Secondary | ICD-10-CM

## 2012-06-20 DIAGNOSIS — I82409 Acute embolism and thrombosis of unspecified deep veins of unspecified lower extremity: Secondary | ICD-10-CM

## 2012-06-20 DIAGNOSIS — Z7901 Long term (current) use of anticoagulants: Secondary | ICD-10-CM

## 2012-06-20 LAB — POCT INR: INR: 2.2

## 2012-06-21 ENCOUNTER — Encounter: Payer: Self-pay | Admitting: Vascular Surgery

## 2012-06-22 ENCOUNTER — Ambulatory Visit: Payer: 59 | Admitting: Vascular Surgery

## 2012-07-01 ENCOUNTER — Ambulatory Visit: Payer: 59 | Admitting: Family Medicine

## 2012-07-01 ENCOUNTER — Telehealth: Payer: Self-pay

## 2012-07-01 NOTE — Telephone Encounter (Signed)
Pt said she has 2 cyst under rt arm. ? MRSA; pt wants antibiotic called in. Advised would need to be seen prior to antibiobic called in also may need to culture area; offered only appt available today at 4 pm with Dr Reece Agar. Pt said she would be at work and requested appt on Mon. Pt scheduled appt on 08/04/12 with Dr Milinda Antis. Advised pt if condition changed or worsened Sat Clinic at [facility] available 9-1 and if after that need to go to UC. Pt voiced understanding and pt has Tylenol and Tramadol if needed for pain.

## 2012-07-03 NOTE — Telephone Encounter (Signed)
Will see her then 

## 2012-07-04 ENCOUNTER — Ambulatory Visit (INDEPENDENT_AMBULATORY_CARE_PROVIDER_SITE_OTHER): Payer: 59 | Admitting: Family Medicine

## 2012-07-04 ENCOUNTER — Encounter: Payer: Self-pay | Admitting: Family Medicine

## 2012-07-04 VITALS — BP 146/86 | HR 75 | Temp 98.8°F | Ht 64.0 in | Wt 214.4 lb

## 2012-07-04 DIAGNOSIS — L089 Local infection of the skin and subcutaneous tissue, unspecified: Secondary | ICD-10-CM

## 2012-07-04 DIAGNOSIS — L723 Sebaceous cyst: Secondary | ICD-10-CM

## 2012-07-04 MED ORDER — CLINDAMYCIN HCL 300 MG PO CAPS: 300.0000 mg | ORAL_CAPSULE | Freq: Three times a day (TID) | ORAL | Status: DC

## 2012-07-04 MED ORDER — MUPIROCIN CALCIUM 2 % NA OINT: TOPICAL_OINTMENT | Freq: Two times a day (BID) | NASAL | Status: DC

## 2012-07-04 NOTE — Progress Notes (Signed)
Subjective:    Patient ID: Kari Rice, female    DOB: 1975-08-09, 37 y.o.   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 TRICHOMONAL VAGINITIS  . UNSPECIFIED VAGINITIS AND VULVOVAGINITIS  . IRREGULAR MENSES  . NECK PAIN  . ELEVATED BP W/O HYPERTENSION  . CONTUSION, CHEST WALL  . Pain in joint, lower leg  . Venous insufficiency, peripheral  . Pedal edema  . Phlebitis and thrombophlebitis of the leg  . Venous insufficiency  . Cyst of skin  . Unspecified venous (peripheral) insufficiency  . Deep venous thrombosis of femoral vein  . 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 Acute venous embolism and thrombosis of deep vessels of proximal lower extremity  . Right leg pain  . Infected cyst of skin  . Headache, acute   No past medical history on file. Past Surgical History  Procedure Laterality Date  . Cholecystectomy      2006  . Thrombectomy     History  Substance Use Topics  . Smoking status: Never Smoker   . Smokeless tobacco: Never Used  . Alcohol Use: No   Family History  Problem Relation Age of Onset  . Arthritis Mother   . Heart disease Father    Allergies  Allergen Reactions  . Penicillins Rash  . Sulfa Antibiotics Rash   Current Outpatient Prescriptions on File Prior to Visit  Medication Sig Dispense Refill  . fluticasone (FLONASE) 50 MCG/ACT nasal spray Place 2 sprays into the nose daily.  16 g  1  . NON FORMULARY Support hose to the waist for dx of venous insufficiency and edema 15-20 mm Hg      . traMADol (ULTRAM) 50 MG tablet Take 1 tablet (50 mg total) by mouth  every 8 (eight) hours as needed for pain.  30 tablet  0  . warfarin (COUMADIN) 5 MG tablet Take 1 tablet (5 mg total) by mouth daily.  60 tablet  3   No current facility-administered medications on file prior to visit.      Review of Systems Review of Systems  Constitutional: Negative for fever, appetite change, fatigue and unexpected weight change.  Eyes: Negative for pain and visual disturbance.  Respiratory: Negative for cough and shortness of breath.   Cardiovascular: Negative for cp or palpitations    Gastrointestinal: Negative for nausea, diarrhea and constipation.  Genitourinary: Negative for urgency and frequency.  Skin: Negative for pallor or rash  pos for cyst/abcess that drains  Neurological: Negative for weakness, light-headedness, numbness and headaches.  Hematological: Negative for adenopathy. Does not bruise/bleed easily.  Psychiatric/Behavioral: Negative for dysphoric mood. The patient is not nervous/anxious.         Objective:   Physical Exam  Constitutional: She appears well-developed and well-nourished. No distress.  HENT:  Head: Normocephalic and atraumatic.  Mouth/Throat: Oropharynx is clear and moist.  Eyes: Conjunctivae and EOM are normal. Pupils are equal, round, and reactive to light. No scleral icterus.  Neck: Normal range of motion. Neck supple.  Cardiovascular: Regular rhythm.   Pulmonary/Chest: Effort normal and breath sounds normal.  Lymphadenopathy:    She has no cervical adenopathy.  Neurological: She is alert.  Skin: Skin is warm and dry. No rash noted. There is erythema. No pallor.  R axilla- 3 superficial cyst/ abcesses from 1-2.5 cm  Most lateral one is soft and pus expressed easily for cx  Other 2 are firm with mild erythema  L axilla- one 1 cm firm cyst  Psychiatric: She has a normal mood and affect.          Assessment & Plan:

## 2012-07-04 NOTE — Assessment & Plan Note (Signed)
With hx of mrsa  Will tx with nasal bactroban for potential carrier  Has hydraadenitis Clindamycin 300 tid for 10 d See AVS for inst  inst to update if worse or no imp

## 2012-07-04 NOTE — Patient Instructions (Addendum)
Take the clindamycin as directed  Use warm compresses and keep area very clean - antibiotic ointment is fine  If worse - or fever- let me know  Will send wound culture  Use the nasal bactroban as directed (this is to cover for MRSA in case you carry it)

## 2012-07-05 ENCOUNTER — Encounter: Payer: Self-pay | Admitting: Vascular Surgery

## 2012-07-05 ENCOUNTER — Ambulatory Visit: Payer: 59 | Admitting: Family Medicine

## 2012-07-06 ENCOUNTER — Ambulatory Visit (INDEPENDENT_AMBULATORY_CARE_PROVIDER_SITE_OTHER): Payer: 59 | Admitting: Vascular Surgery

## 2012-07-06 ENCOUNTER — Encounter: Payer: Self-pay | Admitting: Vascular Surgery

## 2012-07-06 ENCOUNTER — Encounter (INDEPENDENT_AMBULATORY_CARE_PROVIDER_SITE_OTHER): Payer: 59 | Admitting: *Deleted

## 2012-07-06 VITALS — BP 123/81 | HR 84 | Resp 16 | Ht 64.0 in | Wt 214.0 lb

## 2012-07-06 DIAGNOSIS — I83893 Varicose veins of bilateral lower extremities with other complications: Secondary | ICD-10-CM | POA: Insufficient documentation

## 2012-07-06 DIAGNOSIS — I803 Phlebitis and thrombophlebitis of lower extremities, unspecified: Secondary | ICD-10-CM

## 2012-07-06 DIAGNOSIS — I82409 Acute embolism and thrombosis of unspecified deep veins of unspecified lower extremity: Secondary | ICD-10-CM

## 2012-07-06 DIAGNOSIS — Z86718 Personal history of other venous thrombosis and embolism: Secondary | ICD-10-CM | POA: Insufficient documentation

## 2012-07-06 DIAGNOSIS — I82401 Acute embolism and thrombosis of unspecified deep veins of right lower extremity: Secondary | ICD-10-CM

## 2012-07-06 NOTE — Progress Notes (Signed)
[facility]  Patient name: Kari Rice MRN: [id number] DOB: 13-May-1975 Sex: female  REASON FOR VISIT: follow up of right lower extremity DVT  HPI: Kari Rice is a 37 y.o. female who was found to have a right lower extremity DVT in September of 2013. She had clot in the right common femoral vein and right greater saphenous vein. She underwent successful thrombolysis. She had near complete resolution of her thrombus in the right lower extremity. There was some small residual thrombus in the common femoral vein. In addition she had a stent placed in the right iliac vein for a stenosis that was identified on her venogram. She comes in for routine follow up visit.  She remains on her Coumadin. She denies significant right lower extremity pain. She has occasional swelling in the right lower extremity. She denies fever or chills.  Past Medical History  Diagnosis Date  . Varicose veins   . DVT (deep venous thrombosis)     Family History  Problem Relation Age of Onset  . Arthritis Mother   . Cancer Mother   . Heart disease Father     Heart Disease before age 59  . Cancer Father   . Hypertension Father   . Heart attack Father     SOCIAL HISTORY: History  Substance Use Topics  . Smoking status: Never Smoker   . Smokeless tobacco: Never Used  . Alcohol Use: No    Allergies  Allergen Reactions  . Penicillins Rash  . Sulfa Antibiotics Rash    Current Outpatient Prescriptions  Medication Sig Dispense Refill  . clindamycin (CLEOCIN) 300 MG capsule Take 1 capsule (300 mg total) by mouth 3 (three) times daily.  30 capsule  0  . fluticasone (FLONASE) 50 MCG/ACT nasal spray Place 2 sprays into the nose daily.  16 g  1  . mupirocin nasal ointment (BACTROBAN NASAL) 2 % Place into the nose 2 (two) times daily. Use one-half of tube in each nostril twice daily for five (5) days. After application, press sides of nose together and gently massage.  10 g  0  .  NON FORMULARY Support hose to the waist for dx of venous insufficiency and edema 15-20 mm Hg      . traMADol (ULTRAM) 50 MG tablet Take 1 tablet (50 mg total) by mouth every 8 (eight) hours as needed for pain.  30 tablet  0  . warfarin (COUMADIN) 5 MG tablet Take 1 tablet (5 mg total) by mouth daily.  60 tablet  3   No current facility-administered medications for this visit.    REVIEW OF SYSTEMS: Kari.Rice ] denotes positive finding; [  ] denotes negative finding  CARDIOVASCULAR:  [ ]  chest pain   [ ]  chest pressure   [ ]  palpitations   [ ]  orthopnea   [ ]  dyspnea on exertion   [ ]  claudication   [ ]  rest pain   Kari.Rice ] DVT   [ ]  phlebitis PULMONARY:   [ ]  productive cough   [ ]  asthma   [ ]  wheezing NEUROLOGIC:   [ ]  weakness  [ ]  paresthesias  [ ]  aphasia  [ ]  amaurosis  [ ]  dizziness HEMATOLOGIC:   [ ]  bleeding problems   [ ]  clotting disorders MUSCULOSKELETAL:  [ ]  joint pain   [ ]  joint swelling Kari.Rice ] leg swelling GASTROINTESTINAL: [ ]   blood in stool  [ ]   hematemesis GENITOURINARY:  [ ]   dysuria  [ ]   hematuria PSYCHIATRIC:  [ ]  history of major depression INTEGUMENTARY:  [ ]  rashes  [ ]  ulcers CONSTITUTIONAL:  [ ]  fever   [ ]  chills  PHYSICAL EXAM: Filed Vitals:   07/06/12 1424  BP: 123/81  Pulse: 84  Resp: 16  Height: 5\' 4"  (1.626 m)  Weight: 214 lb (97.07 kg)  SpO2: 98%   Body mass index is 36.72 kg/(m^2). GENERAL: The patient is a well-nourished female, in no acute distress. The vital signs are documented above. CARDIOVASCULAR: There is a regular rate and rhythm. Do not detect carotid bruits. She has palpable dorsalis pedis pulses bilaterally. She has mild right lower extremity swelling. PULMONARY: There is good air exchange bilaterally without wheezing or rales. ABDOMEN: Soft and non-tender with normal pitched bowel sounds.  MUSCULOSKELETAL: There are no major deformities or cyanosis. NEUROLOGIC: No focal weakness or paresthesias are detected. SKIN: She has some large truncal  varicosities along the medial aspect of her right lower extremity. PSYCHIATRIC: The patient has a normal affect.  DATA:  I have independently interpreted her venous duplex scan today which shows some chronic clot within the right femoral vein and right greater saphenous vein. There is also reflux noted in the deep system and also in the saphenofemoral junction and right greater saphenous vein.  MEDICAL ISSUES: Patient is doing well status post thrombolysis of her right lower extremity DVT. Given that she had an iliac vein stent in that she has some chronic clot in the right common femoral vein and saphenous vein, I think she should continue her Coumadin for a total of one year. I've encouraged her to stay as active as possible. I've encouraged her to continue to elevate her legs and also to wear compression stockings. If her varicose veins in the left lower extremity become problematic in the future she could potentially be considered for laser ablation of the right greater saphenous vein. However I would not recommend that at this time. I'll plan on seeing her back in one year. She knows to call sooner if she has problems.  Kari Rice S [facility] [place] Beeper: [phone]

## 2012-07-07 LAB — WOUND CULTURE: Gram Stain: NONE SEEN

## 2012-07-18 ENCOUNTER — Encounter: Payer: Self-pay | Admitting: Family Medicine

## 2012-07-18 ENCOUNTER — Ambulatory Visit (INDEPENDENT_AMBULATORY_CARE_PROVIDER_SITE_OTHER): Payer: 59 | Admitting: Family Medicine

## 2012-07-18 VITALS — BP 122/78 | HR 80 | Temp 98.4°F | Wt 210.8 lb

## 2012-07-18 DIAGNOSIS — N390 Urinary tract infection, site not specified: Secondary | ICD-10-CM | POA: Insufficient documentation

## 2012-07-18 DIAGNOSIS — R300 Dysuria: Secondary | ICD-10-CM

## 2012-07-18 LAB — POCT URINALYSIS DIPSTICK
Bilirubin, UA: NEGATIVE
Ketones, UA: NEGATIVE
Spec Grav, UA: 1.01

## 2012-07-18 MED ORDER — NITROFURANTOIN MONOHYD MACRO 100 MG PO CAPS: 100.0000 mg | ORAL_CAPSULE | Freq: Two times a day (BID) | ORAL | Status: DC

## 2012-07-18 NOTE — Progress Notes (Signed)
  Subjective:    Patient ID: Kari Rice, female    DOB: 1975/07/20, 37 y.o.   MRN: [id number]  HPI CC: UTI?  1d h/o dysuria, urgency, darker urine. No frequency, hematuria, fevers/chills, nausea/vomiting, back or flank pain, abd pain. Currently on period.  Last UTI was several years ago.  Has had yeast infection after cipro use in past.  Just finished clindamycin for MRSA infection in axillary region.  That has cleared up.  On coumadin for h/o DVT 01/2012.  Sees Dr. Edilia Bo for this.  Followed by coumadin clinic here.  Has iliac stent in place. Coumadin dose - 5mg  daily. Lab Results  Component Value Date   INR 2.2 06/20/2012   INR 2.3 05/09/2012   INR 2.5 03/29/2012  due for rpt April 21st.  Past Medical History  Diagnosis Date  . Varicose veins   . DVT (deep venous thrombosis)    Past Surgical History  Procedure Laterality Date  . Thrombectomy Right 01/13/12 and  01/21/12    Right iliac vein stent and Vein thrombosis  . Cholecystectomy  Feb. 2006    Gall Bladder    Review of Systems Per HPI    Objective:   Physical Exam  Nursing note and vitals reviewed. Constitutional: She appears well-developed and well-nourished. No distress.  Abdominal: Soft. Normal appearance and bowel sounds are normal. She exhibits no distension and no mass. There is no hepatosplenomegaly. There is no tenderness. There is no rigidity, no rebound, no guarding, no CVA tenderness and negative Murphy's sign.  Skin: Skin is warm and dry. No rash noted.  Psychiatric: She has a normal mood and affect.       Assessment & Plan:

## 2012-07-18 NOTE — Assessment & Plan Note (Signed)
UA and micro consistent with UTI (large RBC, 2+ LE) UCx sent given recent abx use. Treat with nitrofurantoin twice daily for 7 days. Update [place] if sxs persist. Return here next week for sooner coumadin check. Pt agrees with plan.

## 2012-07-18 NOTE — Patient Instructions (Signed)
You have UTI. Treat with macrobid twice daily for 7 days. Push fluids and rest. Let us know if not improved after treatment. Return next week for coumadin check (1 week early) - schedule up front.

## 2012-07-19 LAB — URINE CULTURE

## 2012-07-25 ENCOUNTER — Ambulatory Visit (INDEPENDENT_AMBULATORY_CARE_PROVIDER_SITE_OTHER): Payer: 59 | Admitting: General Practice

## 2012-07-25 DIAGNOSIS — I82409 Acute embolism and thrombosis of unspecified deep veins of unspecified lower extremity: Secondary | ICD-10-CM

## 2012-07-25 DIAGNOSIS — Z5181 Encounter for therapeutic drug level monitoring: Secondary | ICD-10-CM

## 2012-07-25 DIAGNOSIS — Z7901 Long term (current) use of anticoagulants: Secondary | ICD-10-CM

## 2012-07-25 LAB — POCT INR: INR: 2.4

## 2012-08-01 ENCOUNTER — Ambulatory Visit: Payer: 59

## 2012-09-08 ENCOUNTER — Ambulatory Visit (INDEPENDENT_AMBULATORY_CARE_PROVIDER_SITE_OTHER): Payer: 59 | Admitting: General Practice

## 2012-09-08 DIAGNOSIS — I82409 Acute embolism and thrombosis of unspecified deep veins of unspecified lower extremity: Secondary | ICD-10-CM

## 2012-09-08 DIAGNOSIS — Z7901 Long term (current) use of anticoagulants: Secondary | ICD-10-CM

## 2012-09-08 DIAGNOSIS — Z5181 Encounter for therapeutic drug level monitoring: Secondary | ICD-10-CM

## 2012-09-27 NOTE — Telephone Encounter (Signed)
Enc opened in error

## 2012-10-20 ENCOUNTER — Ambulatory Visit: Payer: 59

## 2013-01-03 ENCOUNTER — Other Ambulatory Visit (HOSPITAL_COMMUNITY): Payer: Self-pay | Admitting: Interventional Radiology

## 2013-01-03 DIAGNOSIS — I82401 Acute embolism and thrombosis of unspecified deep veins of right lower extremity: Secondary | ICD-10-CM

## 2013-02-01 ENCOUNTER — Other Ambulatory Visit (HOSPITAL_COMMUNITY): Payer: Self-pay | Admitting: Interventional Radiology

## 2013-02-01 DIAGNOSIS — I82401 Acute embolism and thrombosis of unspecified deep veins of right lower extremity: Secondary | ICD-10-CM

## 2013-04-03 ENCOUNTER — Ambulatory Visit: Payer: Self-pay | Admitting: Family Medicine

## 2013-04-04 ENCOUNTER — Encounter: Payer: Self-pay | Admitting: Emergency Medicine

## 2013-07-05 ENCOUNTER — Ambulatory Visit: Payer: 59 | Admitting: Vascular Surgery

## 2013-10-01 IMAGING — CT CT ABD-PELV W/ CM
3 of 4 series · 13 of 36 positions shown, 19 images · IV contrast (READICAT/WATER & [ID] OMNI 300)
Comparison: None.

CLINICAL DATA: Lymphedema of the right leg, evaluate for
compression of venous or lymphatic system

CT ABDOMEN AND PELVIS WITH CONTRAST
TECHNIQUE: Multidetector CT imaging of the abdomen and pelvis was
performed following the standard protocol during bolus
administration of intravenous contrast.
Contrast: 100mL OMNIPAQUE IOHEXOL 300 MG/ML  SOLN

[Series 3: venogram · axial · 0.90mm/px · z∈[-336,-11]mm · 7 of 87 slices shown, 12 images]
[im 11/87  soft-tissue]
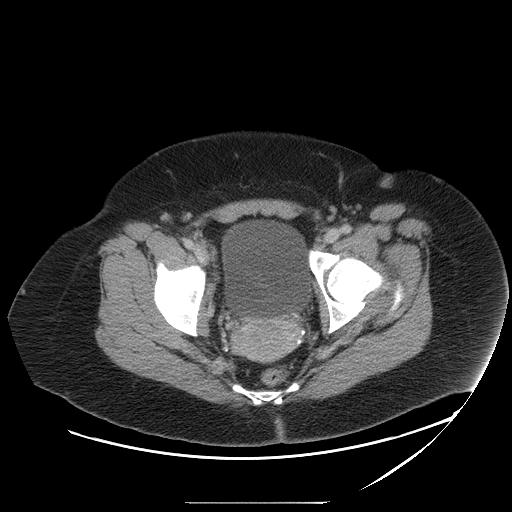
[im 11/87  bone]
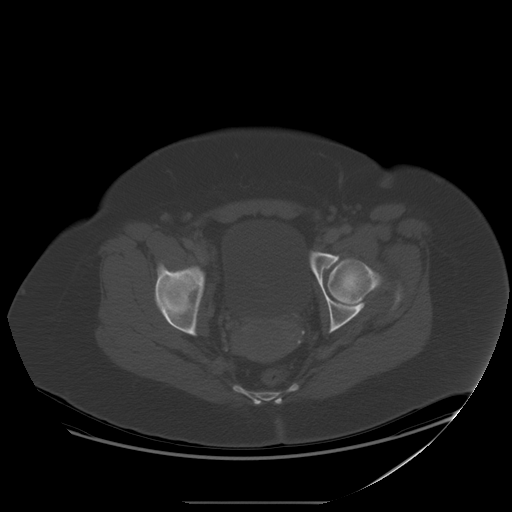
[im 22/87  soft-tissue]
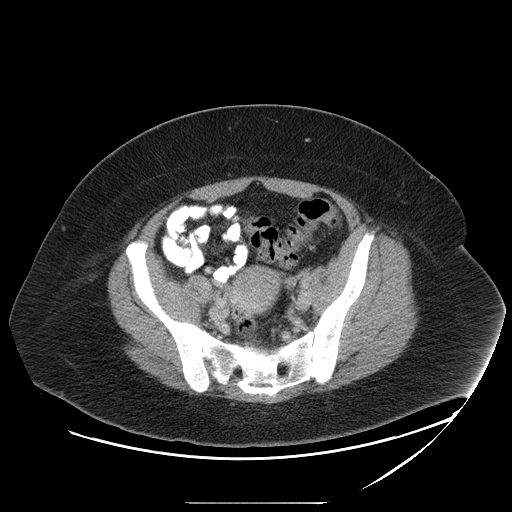
[im 33/87  soft-tissue]
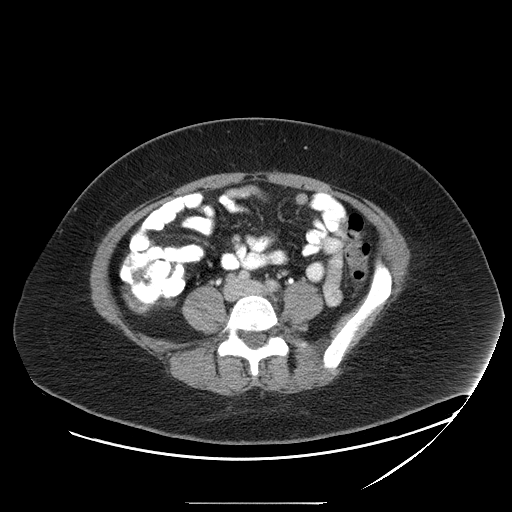
[im 44/87  soft-tissue]
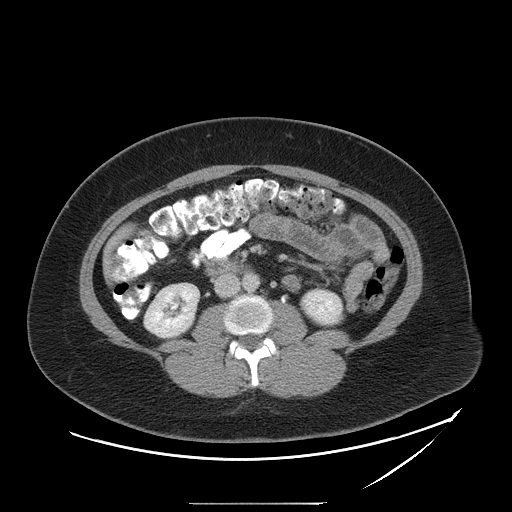
[im 44/87  lung]
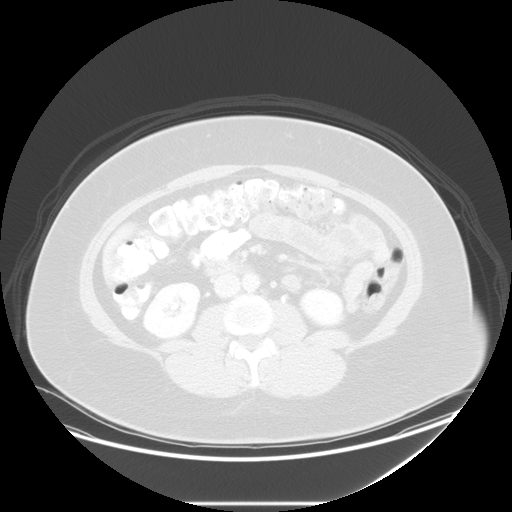
[im 54/87  soft-tissue]
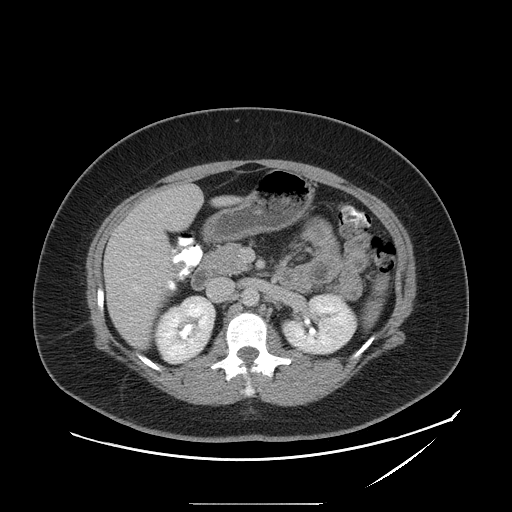
[im 54/87  lung]
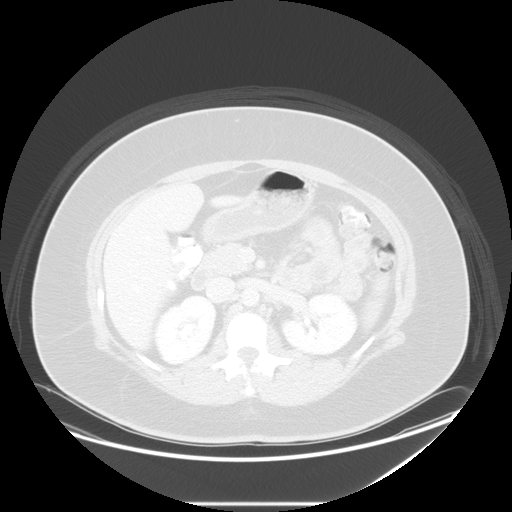
[im 65/87  soft-tissue]
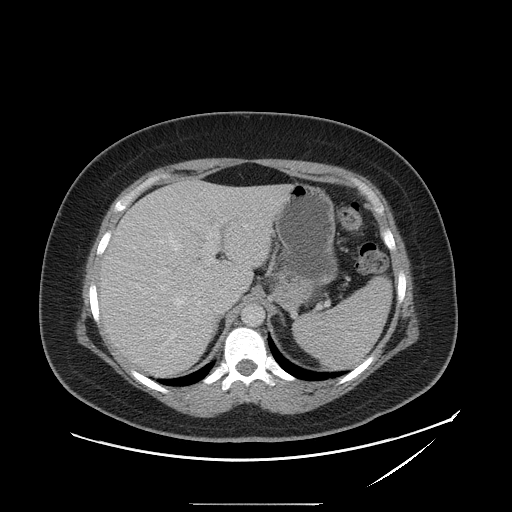
[im 65/87  lung]
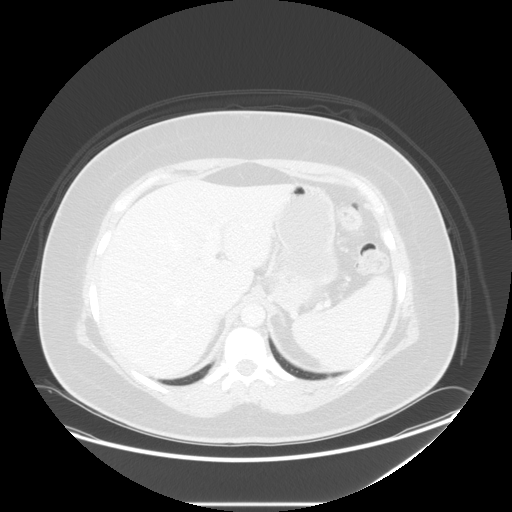
[im 76/87  soft-tissue]
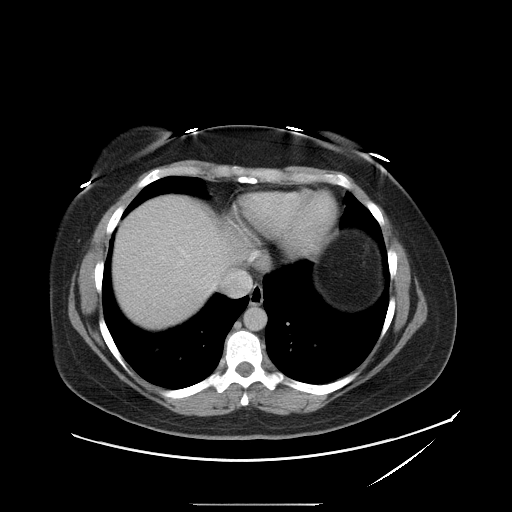
[im 76/87  lung]
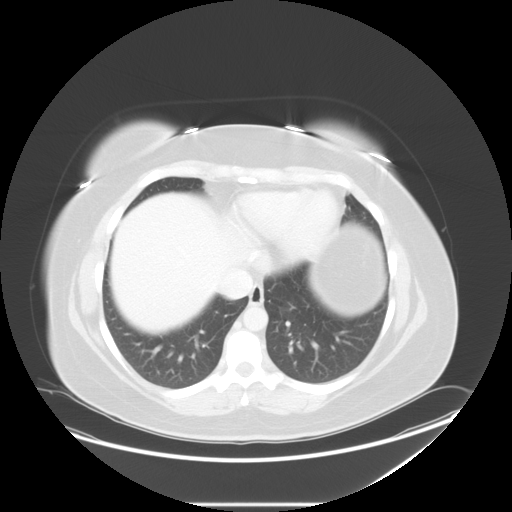

[Series 601: coronal body · coronal · 0.91mm/px · 1 of 135 slices shown, 2 images]
[im 45/135  soft-tissue]
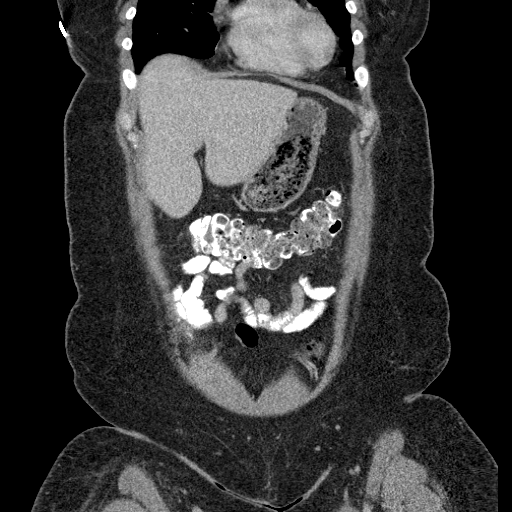
[im 45/135  bone]
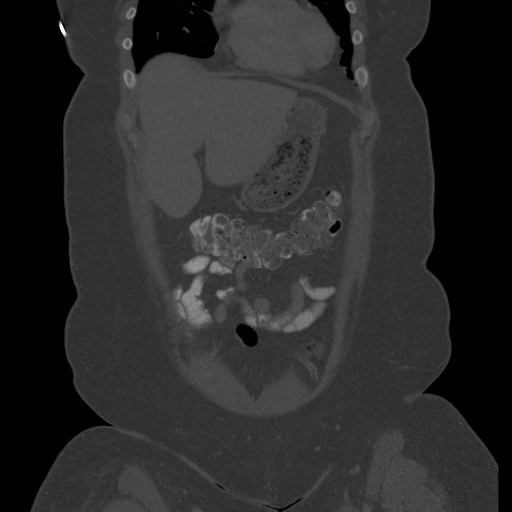

[Series 602: sagittal body · sagittal · 0.91mm/px · 5 of 182 slices shown]
[im 21/182  soft-tissue]
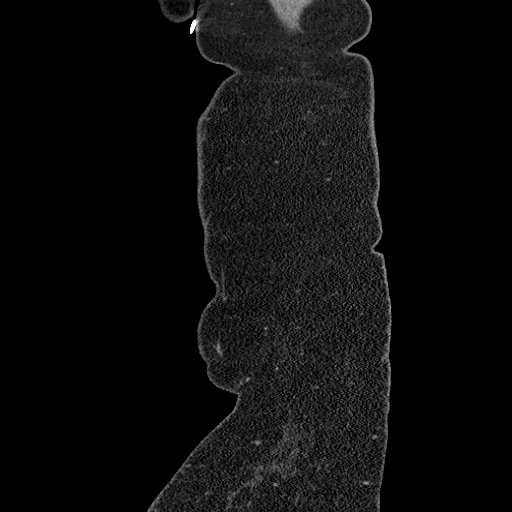
[im 41/182  soft-tissue]
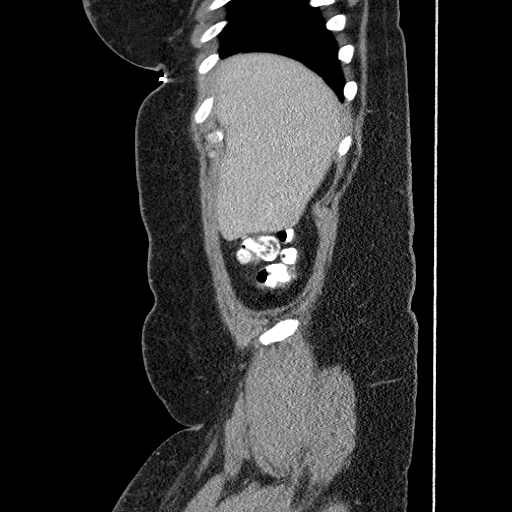
[im 61/182  soft-tissue]
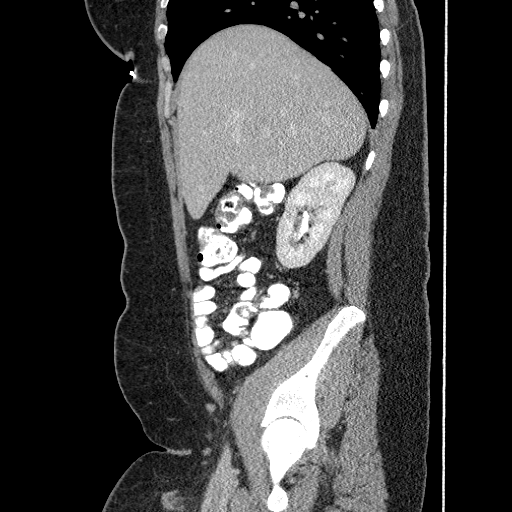
[im 81/182  soft-tissue]
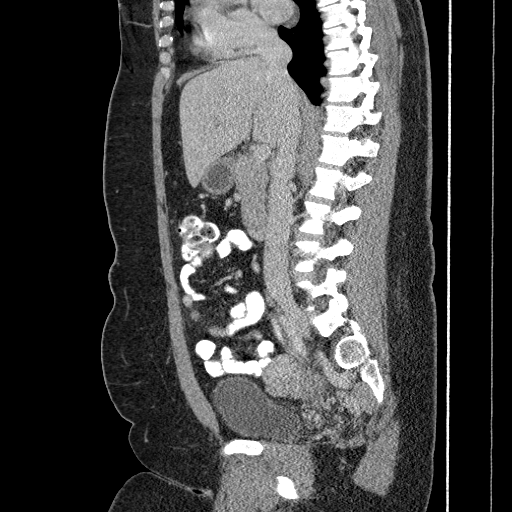
[im 101/182  soft-tissue]
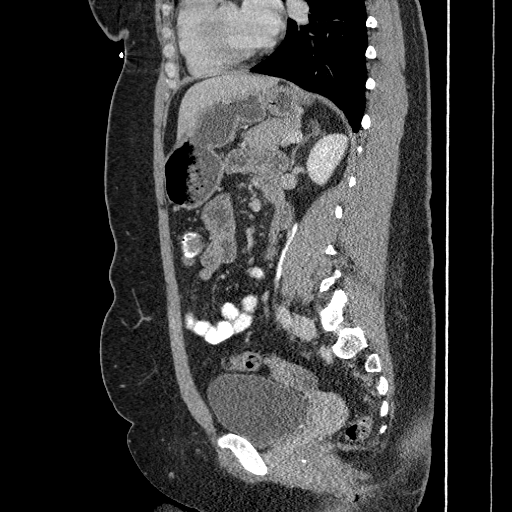

[13 of 36 positions shown; findings below may reference images not displayed]

FINDINGS: The lung bases are clear.  The liver enhances with no
focal abnormality and no ductal dilatation is seen.  The
gallbladder appears to have been resected.  The pancreas is normal
in size and the pancreatic duct is not dilated.  The adrenal glands
and spleen are unremarkable.  The stomach is moderately fluid
distended with no abnormality noted.  The kidneys enhance with no
evidence of mass or hydronephrosis.  The abdominal aorta is normal
in caliber.  The IVC is unremarkable.  No adenopathy is seen.

The iliac veins are unremarkable with no mass or compression
evident.  The proximal superficial femoral veins also appear
symmetrical and within normal limits.  There does appear to be a
slightly prominent lymph node on the very last image within the
anterior right groin but there is fat separation between that
prominent lymph node and the venous return.

The urinary bladder is unremarkable.  The uterus is normal in size.
There does appear to be a small left ovarian cyst present.  No
significant free fluid is seen within the pelvis.  No abnormality
of the colon is seen.  The terminal ileum is unremarkable.  The
appendix is well visualized and fills with contrast and air
normally. No bony abnormality is seen.
IMPRESSION: 1.  No abdominal or pelvic cause of edema in the right leg is seen.
2.  Somewhat prominent right groin lymph node of questionable
significance.

## 2013-10-23 IMAGING — XA IR THROMB F/U EVAL ART/VEN FINAL DAY
1 series · 12 of 24 positions shown · non-contrast
Comparison: none

Clinical Data/Indication: RIGHT LOWER EXTREMITY DVT.  24 HOURS
STATUS POST VENOUS LYSIS.

POST LYSIS VENOUS CHECK ,PTA VENOUS,STENT PLACEMENT
Sedation: Versed three mg, Fentanyl 100 mg.
Total Moderate Sedation Time: 36 minutes.
Contrast Volume: 100 ml Nmnipaque-LGG.
Fluoroscopy Time: 7.6 minutes.
Procedure: The procedure, risks, benefits, and alternatives were
explained to the patient. Questions regarding the procedure were
encouraged and answered. The patient understands and consents to
the procedure.
The right popliteal fossa in the prone position was prepped with
betadine in a sterile fashion, and a sterile drape was applied
covering the operative field. A sterile gown and sterile gloves
were used for the procedure.
Contrast was injected utilizing sterile technique into the sheath
and infusion catheter.  The infusion catheter was then removed over
an Amplatz wire and repeat venography was performed through the
sheath.  Patency of the deep venous system in the thigh was noted.
There is very minimal residual thrombus in the common femoral vein.
The external iliac vein is patent.  There is a critical stenosis at
the junction between the common and external iliac vein.
The Angiojet device was then advanced over the Amplatz wire to the
common femoral vein.  Thrombi lysis utilizing the Angiojet was
performed for proximally 45 seconds.  Repeat venography was
performed.
Angioplasty device was utilized to dilate the narrowing of the
common and external iliac vein junction.  Repeat venogram
demonstrates little improvement.
A 12 x 60 Smart stent was then deployed across the narrowing.  It
was then post dilated to 10 mm.  Final imaging was performed.  The
sheath was removed and hemostasis was achieved with a hemostasis
had.

[Series 1: run · 12 of 70 slices shown]
[im 4/70]
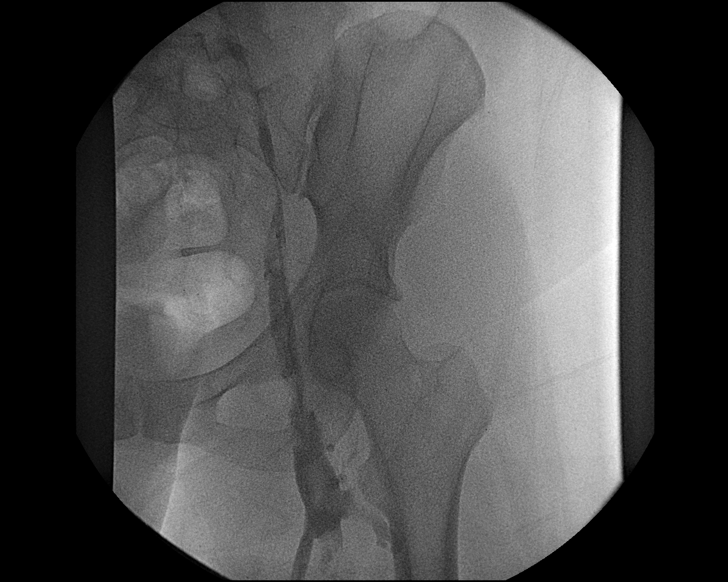
[im 10/70]
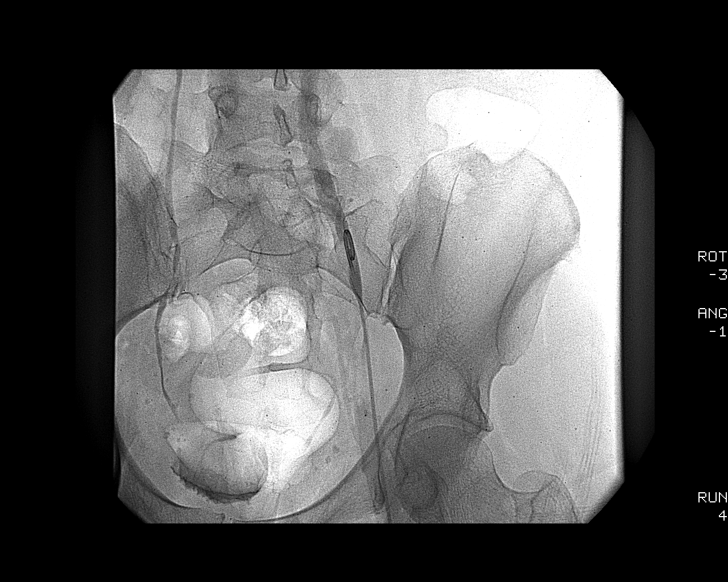
[im 16/70]
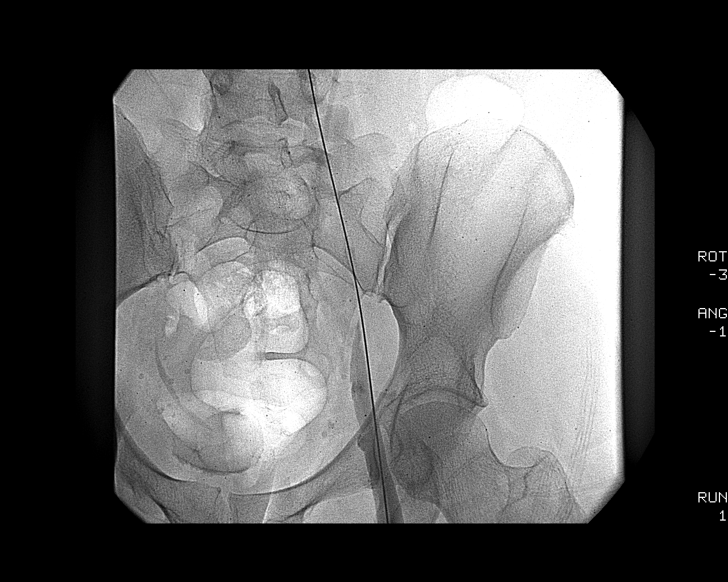
[im 22/70]
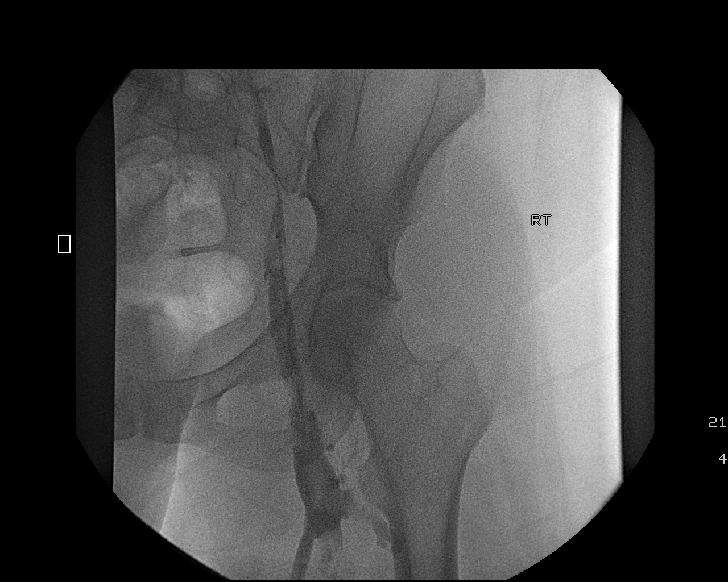
[im 28/70]
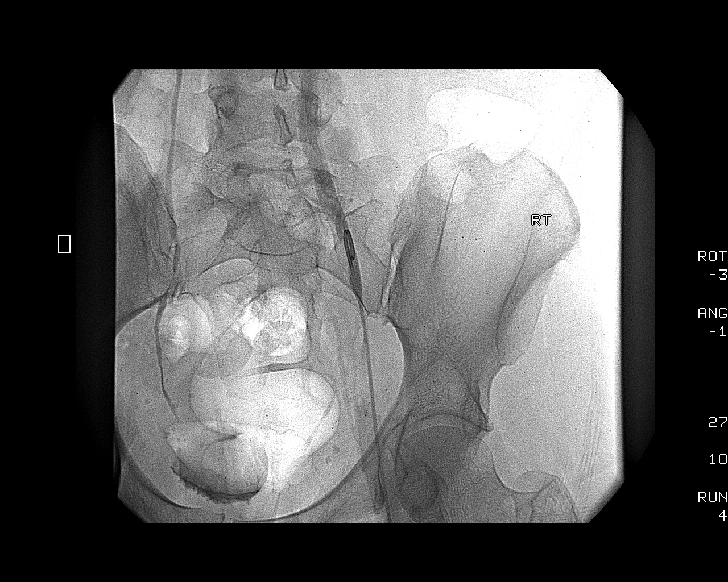
[im 34/70]
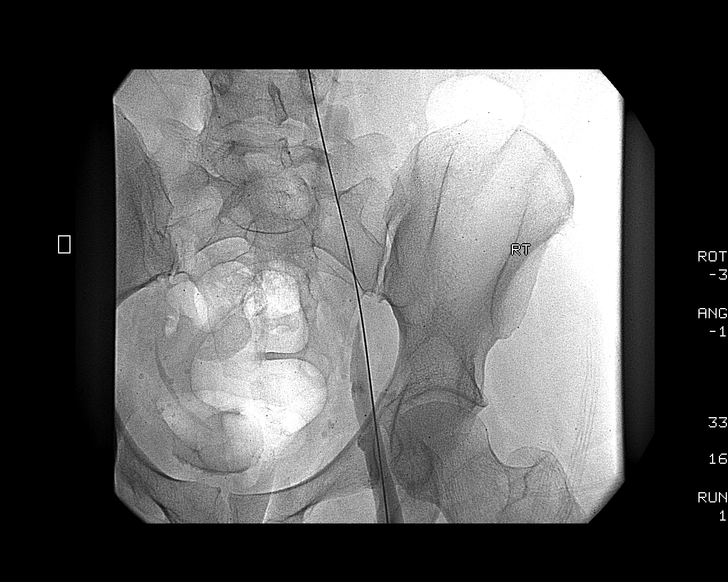
[im 40/70]
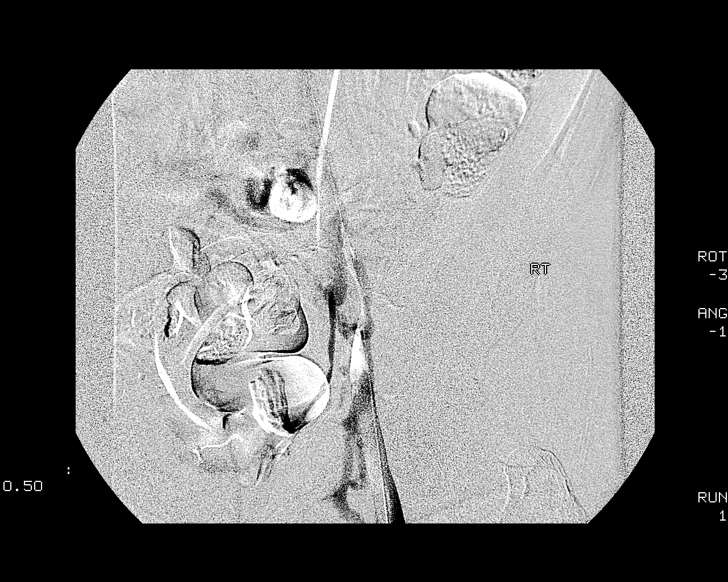
[im 46/70]
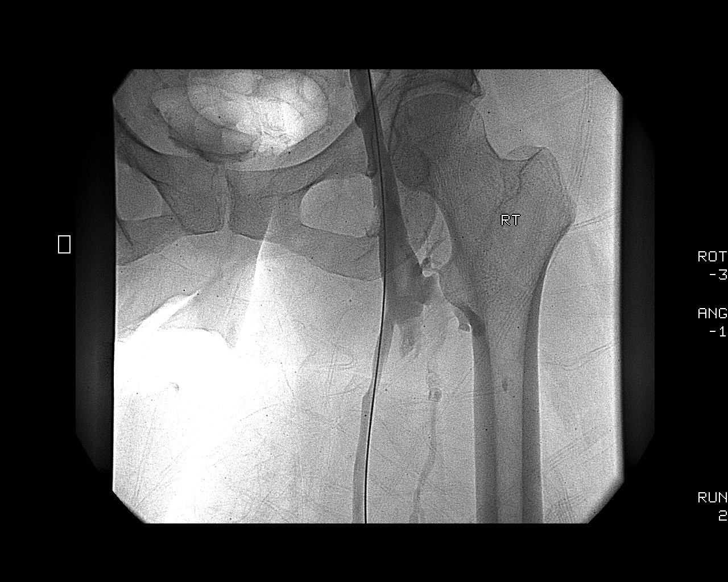
[im 52/70]
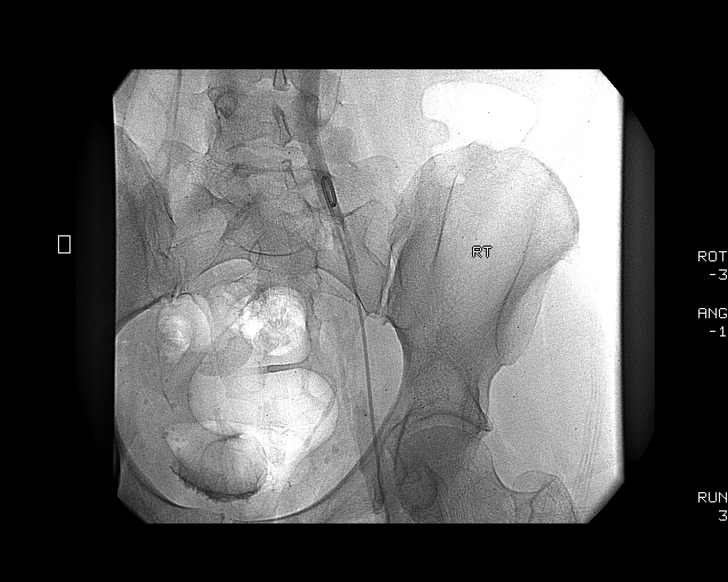
[im 58/70]
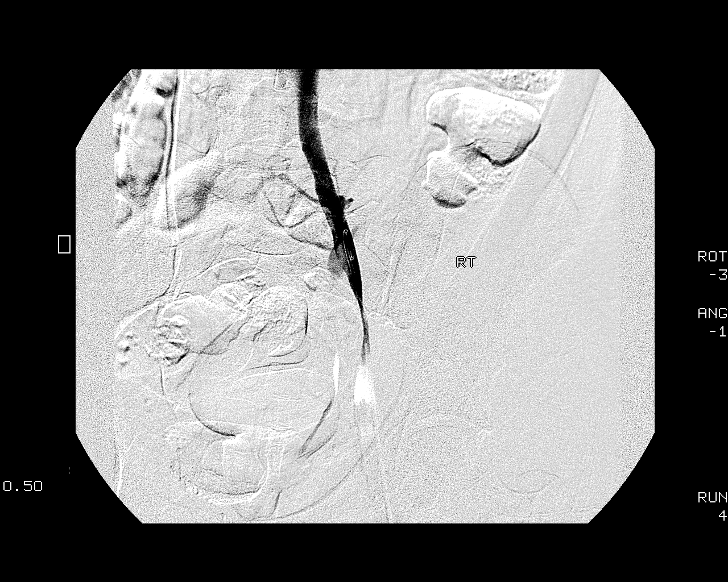
[im 64/70]
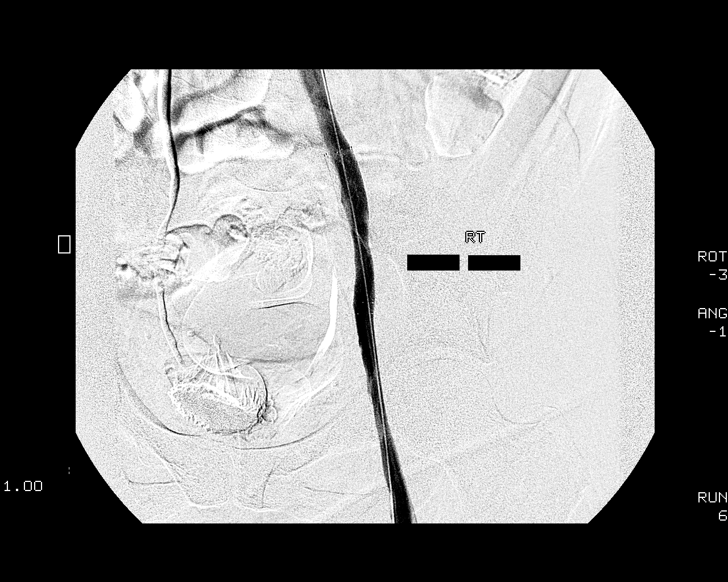
[im 70/70]
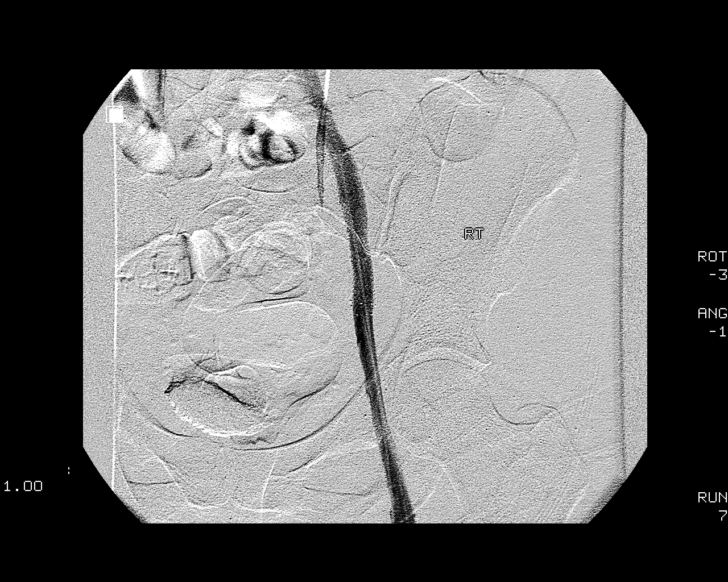

[12 of 24 positions shown; findings below may reference images not displayed]

FINDINGS: Right lower extremity venography demonstrates near
complete resolution of thrombus with minimal thrombus residual in
the common femoral vein.  There is also noted be critical stenosis
at the junction between the common and external iliac vein.

After thrombi lysis utilizing the Angiojet, the common femoral vein
is noted to be patent.

After dilatation of the venous stenosis, repeat venography
demonstrates very little improvement and residual stenosis of the
deep venous system of the pelvis.

After stent placement, the internal and external iliac venous
system is noted to be widely patent.

Complications: None.
IMPRESSION: There is near complete resolution of the acute thrombus within the
right pelvic and lower extremity deep venous system.  Minimal
residual thrombus in the common femoral vein was treated with an
Angiojet device.  A critical stenosis which was resistant to
angioplasty alone at the confluence of the external and iliac vein
was treated successfully with a 12 x 60 Nitinol stent.  The patient
will be placed on oral anticoagulation and compression stockings.

## 2014-01-01 ENCOUNTER — Ambulatory Visit (INDEPENDENT_AMBULATORY_CARE_PROVIDER_SITE_OTHER): Payer: Managed Care, Other (non HMO) | Admitting: Family Medicine

## 2014-01-01 ENCOUNTER — Encounter (INDEPENDENT_AMBULATORY_CARE_PROVIDER_SITE_OTHER): Payer: Self-pay

## 2014-01-01 ENCOUNTER — Encounter: Payer: Self-pay | Admitting: Family Medicine

## 2014-01-01 VITALS — BP 122/78 | HR 65 | Temp 98.3°F | Ht 64.0 in | Wt 221.5 lb

## 2014-01-01 DIAGNOSIS — J209 Acute bronchitis, unspecified: Secondary | ICD-10-CM | POA: Insufficient documentation

## 2014-01-01 MED ORDER — BENZONATATE 200 MG PO CAPS: 200.0000 mg | ORAL_CAPSULE | Freq: Three times a day (TID) | ORAL | Status: DC | PRN

## 2014-01-01 MED ORDER — AZITHROMYCIN 250 MG PO TABS: ORAL_TABLET | ORAL | Status: DC

## 2014-01-01 NOTE — Progress Notes (Signed)
Pre visit review using our clinic review tool, if applicable. No additional management support is needed unless otherwise documented below in the visit note. 

## 2014-01-01 NOTE — Patient Instructions (Signed)
I think you have bronchitis  Allergies may be worsening symptoms as well  Try zyrtec or allegra or claritin daily  Try tessalon for cough  Also mucinex DM Take zithromax as directed  Update if not starting to improve in a week or if worsening

## 2014-01-01 NOTE — Progress Notes (Signed)
Subjective:    Patient ID: Kari Rice, female    DOB: 07-07-1975, 38 y.o.   MRN: [id number]  HPI Coughing for a month - cannot get rid of it  Getting worse  Tickles  and rattling - but she cannot get mucous out  A bit of wheezing at night   No nasal symptoms  She does have baseline congestion -no more than usual  Chest feels tight No st   Clears her throat some   She tried mucinex fastmax -helped a bit   Patient Active Problem List   Diagnosis Date Noted  . UTI (urinary tract infection) 07/18/2012  . DVT (deep venous thrombosis) 07/06/2012  . Varicose veins of lower extremities with other complications 56/43/3295  . Headache, acute 06/03/2012  . Right leg pain 05/09/2012  . Infected cyst of skin 05/09/2012  . 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 Acute venous embolism and thrombosis of deep vessels of proximal lower extremity 01/13/2012  . Unspecified venous (peripheral) insufficiency 12/23/2011  . Cyst of skin 12/15/2011  . Venous insufficiency 11/25/2011  . Phlebitis and thrombophlebitis of the leg 11/06/2011  . Venous insufficiency, peripheral 10/26/2011  . Pedal edema 10/26/2011  . Pain in joint, lower leg 06/04/2010  . NECK PAIN 10/21/2009  . CONTUSION, CHEST WALL 10/21/2009  . IRREGULAR MENSES 02/29/2008  . UNSPECIFIED VAGINITIS AND VULVOVAGINITIS 01/03/2008  . ELEVATED BP W/O HYPERTENSION 09/21/2007  . 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 Varicose veins   . DVT (deep venous thrombosis)    Past Surgical History  Procedure Laterality Date  . Thrombectomy Right 01/13/12 and  01/21/12    Right iliac vein stent and Vein thrombosis  . Cholecystectomy  Feb. 2006    Gall Bladder   History  Substance Use Topics  . Smoking status: Never Smoker   . Smokeless tobacco: Never Used  . Alcohol Use: No   Family History  Problem Relation Age of Onset  . Arthritis  Mother   . Cancer Mother   . Heart disease Father     Heart Disease before age 31  . Cancer Father   . Hypertension Father   . Heart attack Father    Allergies  Allergen Reactions  . Penicillins Rash  . Sulfa Antibiotics Rash   Current Outpatient Prescriptions on File Prior to Visit  Medication Sig Dispense Refill  . NON FORMULARY Support hose to the waist for dx of venous insufficiency and edema 15-20 mm Hg       No current facility-administered medications on file prior to visit.    Review of Systems Review of Systems  Constitutional: Negative for fever, appetite change,  and unexpected weight change. pos for fatigue ENT pos for rhinorrhea and post nasal drip and st  Eyes: Negative for pain and visual disturbance.  Respiratory: Negative for shortness of breath.   Cardiovascular: Negative for cp or palpitations    Gastrointestinal: Negative for nausea, diarrhea and constipation.  Genitourinary: Negative for urgency and frequency.  Skin: Negative for pallor or rash   Neurological: Negative for weakness, light-headedness, numbness and headaches.  Hematological: Negative for adenopathy. Does not bruise/bleed easily.  Psychiatric/Behavioral: Negative for dysphoric mood. The patient is not nervous/anxious.         Objective:   Physical Exam  Constitutional: She appears well-developed and well-nourished. No distress.  HENT:  Head: Normocephalic and atraumatic.  Right Ear: External ear  normal.  Left Ear: External ear normal.  Mouth/Throat: Oropharynx is clear and moist. No oropharyngeal exudate.  Nares are injected and congested  Clear rhinorrhea and post nasal drip  No sinus tenderness   Eyes: Conjunctivae and EOM are normal. Pupils are equal, round, and reactive to light. Right eye exhibits no discharge. Left eye exhibits no discharge.  Neck: Normal range of motion. Neck supple.  Cardiovascular: Normal rate, regular rhythm and normal heart sounds.   Pulmonary/Chest:  Effort normal. No respiratory distress. She has no wheezes. She has no rales. She exhibits tenderness.  Harsh bs with scattered rhonchi Good air exch No rales   Lymphadenopathy:    She has no cervical adenopathy.  Neurological: She is alert.  Skin: Skin is warm and dry. No rash noted. No erythema.  Psychiatric: She has a normal mood and affect.          Assessment & Plan:   Problem List Items Addressed This Visit     Respiratory   Acute bronchitis - Primary     Cover with zithromax  Tessalon for cough / can still use mucinex DM Fluids/rest  otc antihistamine for post nasal drip/nasal symptoms that worsen cough Update if not starting to improve in a week or if worsening

## 2014-01-01 NOTE — Assessment & Plan Note (Signed)
Cover with zithromax  Tessalon for cough / can still use mucinex DM Fluids/rest  otc antihistamine for post nasal drip/nasal symptoms that worsen cough Update if not starting to improve in a week or if worsening

## 2014-04-10 ENCOUNTER — Ambulatory Visit (INDEPENDENT_AMBULATORY_CARE_PROVIDER_SITE_OTHER)
Admission: RE | Admit: 2014-04-10 | Discharge: 2014-04-10 | Disposition: A | Discharge status: Home / Self Care | Payer: Managed Care, Other (non HMO) | Source: Ambulatory Visit | Attending: Family Medicine | Admitting: Family Medicine

## 2014-04-10 ENCOUNTER — Encounter: Payer: Self-pay | Admitting: Family Medicine

## 2014-04-10 ENCOUNTER — Ambulatory Visit (INDEPENDENT_AMBULATORY_CARE_PROVIDER_SITE_OTHER): Payer: Managed Care, Other (non HMO) | Admitting: Family Medicine

## 2014-04-10 VITALS — BP 124/70 | HR 74 | Temp 98.5°F | Ht 64.0 in | Wt 220.8 lb

## 2014-04-10 DIAGNOSIS — M25562 Pain in left knee: Secondary | ICD-10-CM

## 2014-04-10 DIAGNOSIS — M2550 Pain in unspecified joint: Secondary | ICD-10-CM

## 2014-04-10 DIAGNOSIS — M79644 Pain in right finger(s): Secondary | ICD-10-CM

## 2014-04-10 NOTE — Patient Instructions (Signed)
Xray of thumb and knee today  Labs also for joint disease  Take care of yourself  Use ice on knee or thumb if needed  The braces you wear are fine  Aleve may help as needed 1-2 with food up to every 12 hours

## 2014-04-10 NOTE — Progress Notes (Signed)
Pre visit review using our clinic review tool, if applicable. No additional management support is needed unless otherwise documented below in the visit note. 

## 2014-04-10 NOTE — Progress Notes (Signed)
Subjective:    Patient ID: Kari Rice, female    DOB: December 14, 1975, 38 y.o.   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 Helpful Quite still in the am depending on the day ? If swelling   Has fam hx of RA- mother has it  Father has OA   L knee also hurts  Wants to overextend while walking  Tight to flex past 90 deg and to rotate  Perhaps a bit of swelling laterally  No injury noted  No redness/ heat  Hurts a lot to kneel  Bad on stairs also  otc brace helps a bit but irritates skin   Walks a lot at work- does laps at work - 30 min before work  It warms up during [employer] - usually helps a bit  Also stiff in am   Patient Active Problem List   Diagnosis Date Noted  . Acute bronchitis 01/01/2014  . UTI (urinary tract infection) 07/18/2012  . DVT (deep venous thrombosis) 07/06/2012  . Varicose veins of lower extremities with other complications 55/37/4827  . Headache, acute 06/03/2012  . Right leg pain 05/09/2012  . Infected cyst of skin 05/09/2012  . 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 Acute venous embolism and thrombosis of deep vessels of proximal lower extremity 01/13/2012  . Unspecified venous (peripheral) insufficiency 12/23/2011  . Cyst of skin 12/15/2011  . Venous insufficiency 11/25/2011  . Phlebitis and thrombophlebitis of the leg 11/06/2011  . Venous insufficiency, peripheral 10/26/2011  . Pedal edema 10/26/2011  . Pain in joint, lower leg 06/04/2010  . NECK PAIN 10/21/2009  . CONTUSION, CHEST WALL 10/21/2009  . IRREGULAR MENSES 02/29/2008  . UNSPECIFIED VAGINITIS AND VULVOVAGINITIS 01/03/2008  . ELEVATED BP W/O HYPERTENSION 09/21/2007  . 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 Varicose veins   . DVT (deep venous thrombosis)    Past Surgical History  Procedure Laterality Date  . Thrombectomy Right 01/13/12 and  01/21/12    Right iliac vein stent and Vein thrombosis  . Cholecystectomy  Feb. 2006    Gall Bladder   History  Substance Use Topics  . Smoking status: Never Smoker   . Smokeless tobacco: Never Used  . Alcohol Use: No   Family History  Problem Relation Age of Onset  . Arthritis Mother   . Cancer Mother   . Heart disease Father     Heart Disease before age 13  . Cancer Father   . Hypertension Father   . Heart attack Father    Allergies  Allergen Reactions  . Penicillins Rash  . Sulfa Antibiotics Rash   Current Outpatient Prescriptions on File Prior to Visit  Medication Sig Dispense Refill  . NON FORMULARY Support hose to the waist for dx of venous insufficiency and edema 15-20 mm Hg     No current facility-administered medications on file prior to visit.     Review of Systems Review of Systems  Constitutional: Negative for fever, appetite change, fatigue and unexpected weight change.  Eyes: Negative for pain and visual disturbance.  Respiratory: Negative for cough and shortness of breath.   Cardiovascular: Negative for cp or  palpitations    Gastrointestinal: Negative for nausea, diarrhea and constipation.  Genitourinary: Negative for urgency and frequency.  Skin: Negative for pallor or rash   MSK pos for knee and thumb pain  / no other joint changes  Neurological: Negative for weakness, light-headedness, numbness and headaches.  Hematological: Negative for adenopathy. Does not bruise/bleed easily.  Psychiatric/Behavioral: Negative for dysphoric mood. The patient is not nervous/anxious.         Objective:   Physical Exam  Constitutional: She appears well-developed and well-nourished. No distress.  obese and well appearing   HENT:  Head: Normocephalic and atraumatic.  Mouth/Throat: Oropharynx is clear and moist.    Eyes: Conjunctivae and EOM are normal. Pupils are equal, round, and reactive to light.  Neck: Normal range of motion. Neck supple.  Cardiovascular: Normal rate and regular rhythm.   Pulmonary/Chest: Effort normal and breath sounds normal.  Musculoskeletal: She exhibits edema and tenderness.       Left knee: She exhibits decreased range of motion. She exhibits no swelling, no effusion, no ecchymosis, no deformity, normal alignment, no LCL laxity, normal patellar mobility, normal meniscus and no MCL laxity. Tenderness found. Medial joint line and patellar tendon tenderness noted.       Right hand: She exhibits tenderness and bony tenderness. Normal sensation noted. Normal strength noted.  Right thumb- some tenderness over first MCP  No swelling or warmth No crepitus  Nl rom  Neg finklestien test    L knee  Tender medially  No effusion  Pain to flex over 90 deg Stable on drawer and lachman test slt crepitus     Lymphadenopathy:    She has no cervical adenopathy.  Neurological: She is alert. She has normal reflexes.  Skin: Skin is warm and dry. No rash noted. No erythema.  Psychiatric: She has a normal mood and affect.          Assessment & Plan:   Problem List Items Addressed This Visit      Other   Joint pain    In light of 2 joints with pain (knee and thumb) as well as fam hx of RA- rheum labs ordered     Relevant Orders      ANA      Rheumatoid factor      Sedimentation rate   Left knee pain - Primary    Medially tender  Some mild crepitus  Adv elevation /cold compress and patellar stabilizing brace when needed  Xray today Analgesics when needed   Disc poss of patellar tracking problem vs poss early OA Also did rheum labs in light of fam hx     Relevant Orders      DG Knee Complete 4 Views Left (Completed)      ANA      Rheumatoid factor      Sedimentation rate   Pain of right thumb    Suspect from overuse OA of first MCP is possible  Fairly nl exam  with some tenderness Has brace to wear at night  Xray today    Relevant Orders      DG Finger Thumb Right (Completed)      ANA      Rheumatoid factor      Sedimentation rate

## 2014-04-11 LAB — ANA: ANA: NEGATIVE

## 2014-04-11 LAB — SEDIMENTATION RATE: Sed Rate: 21 mm/hr (ref 0–32)

## 2014-04-11 LAB — RHEUMATOID FACTOR: RHEUMATOID FACTOR: 9.2 [IU]/mL (ref 0.0–13.9)

## 2014-04-11 NOTE — Assessment & Plan Note (Signed)
In light of 2 joints with pain (knee and thumb) as well as fam hx of RA- rheum labs ordered

## 2014-04-11 NOTE — Assessment & Plan Note (Signed)
Medially tender  Some mild crepitus  Adv elevation /cold compress and patellar stabilizing brace when needed  Xray today Analgesics when needed   Disc poss of patellar tracking problem vs poss early OA Also did rheum labs in light of fam hx

## 2014-04-11 NOTE — Assessment & Plan Note (Signed)
Suspect from overuse OA of first MCP is possible  Fairly nl exam with some tenderness Has brace to wear at night  Xray today

## 2014-04-13 HISTORY — PX: HAND SURGERY: SHX662

## 2014-04-16 ENCOUNTER — Telehealth: Payer: Self-pay | Admitting: Family Medicine

## 2014-04-16 DIAGNOSIS — M25562 Pain in left knee: Secondary | ICD-10-CM

## 2014-04-16 DIAGNOSIS — M79644 Pain in right finger(s): Secondary | ICD-10-CM

## 2014-04-16 NOTE — Telephone Encounter (Signed)
-----   Message from Tammi Sou, [place] sent at 04/16/2014 12:49 PM EST ----- Pt notified of labs and xray results and agrees with referral to ortho doc. I advise pt [facility] will call to schedule appt., please put in referral

## 2014-04-30 ENCOUNTER — Ambulatory Visit (INDEPENDENT_AMBULATORY_CARE_PROVIDER_SITE_OTHER): Payer: Managed Care, Other (non HMO)

## 2014-04-30 ENCOUNTER — Telehealth: Payer: Self-pay

## 2014-04-30 DIAGNOSIS — M25562 Pain in left knee: Secondary | ICD-10-CM

## 2014-04-30 DIAGNOSIS — Z23 Encounter for immunization: Secondary | ICD-10-CM

## 2014-04-30 DIAGNOSIS — M79644 Pain in right finger(s): Secondary | ICD-10-CM

## 2014-04-30 NOTE — Telephone Encounter (Signed)
Pt said she does want to go somewhere else like [facility] or somewhere else in [place] is okay, pt advise [facility] will call to schedule appt., please put referral in

## 2014-04-30 NOTE — Telephone Encounter (Signed)
Ref done  

## 2014-04-30 NOTE — Telephone Encounter (Signed)
Pt was seen today for flu shot. Pt states she wanted to update you on her OV on 04/23/2014 with Dr Tamala Julian at [facility]. Pt states she was disappointed with her Dx and wants a second opinion. Pt had requested to speak with you while she was in the office, i advised pt you were seeing patients and i would send a note to you and we would be calling her back in reference to this matter--please advise

## 2014-04-30 NOTE — Telephone Encounter (Signed)
I will refer her somewhere else - is [place] ok?

## 2014-05-02 ENCOUNTER — Ambulatory Visit (INDEPENDENT_AMBULATORY_CARE_PROVIDER_SITE_OTHER): Payer: Managed Care, Other (non HMO) | Admitting: Family Medicine

## 2014-05-02 ENCOUNTER — Encounter: Payer: Self-pay | Admitting: Family Medicine

## 2014-05-02 VITALS — BP 116/80 | HR 87 | Temp 98.9°F | Ht 64.0 in | Wt 221.8 lb

## 2014-05-02 DIAGNOSIS — J0110 Acute frontal sinusitis, unspecified: Secondary | ICD-10-CM

## 2014-05-02 DIAGNOSIS — J019 Acute sinusitis, unspecified: Secondary | ICD-10-CM | POA: Insufficient documentation

## 2014-05-02 MED ORDER — AZITHROMYCIN 250 MG PO TABS: ORAL_TABLET | ORAL | Status: DC

## 2014-05-02 NOTE — Progress Notes (Signed)
Pre visit review using our clinic review tool, if applicable. No additional management support is needed unless otherwise documented below in the visit note. 

## 2014-05-02 NOTE — Patient Instructions (Signed)
Drink fluids and rest  I think you have a cold and a sinus infection  Take zpak as directed for the sinus infection  Continue mucinex DM max for symptoms as needed  Update if not starting to improve in a week or if worsening

## 2014-05-02 NOTE — Progress Notes (Signed)
Subjective:    Patient ID: Kari Rice, female    DOB: 1975-10-14, 39 y.o.   MRN: [id number]  HPI Here with uri symptoms Started a week ago - with stuffiness and st   Head /sinus pressure- around eyes  No fever  Some cough - rattling and some prod (? What color)-- stomach upset from that  Post nasal drip   thinks she is getting a bacterial sinus infection   mucinex DM max, day quil as needed    Patient Active Problem List   Diagnosis Date Noted  . Pain of right thumb 04/10/2014  . Left knee pain 04/10/2014  . Joint pain 04/10/2014  . Acute bronchitis 01/01/2014  . UTI (urinary tract infection) 07/18/2012  . DVT (deep venous thrombosis) 07/06/2012  . Varicose veins of lower extremities with other complications 35/70/1779  . Headache, acute 06/03/2012  . Right leg pain 05/09/2012  . Infected cyst of skin 05/09/2012  . 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 Acute venous embolism and thrombosis of deep vessels of proximal lower extremity 01/13/2012  . Unspecified venous (peripheral) insufficiency 12/23/2011  . Cyst of skin 12/15/2011  . Venous insufficiency 11/25/2011  . Phlebitis and thrombophlebitis of the leg 11/06/2011  . Venous insufficiency, peripheral 10/26/2011  . Pedal edema 10/26/2011  . Pain in joint, lower leg 06/04/2010  . NECK PAIN 10/21/2009  . CONTUSION, CHEST WALL 10/21/2009  . IRREGULAR MENSES 02/29/2008  . UNSPECIFIED VAGINITIS AND VULVOVAGINITIS 01/03/2008  . ELEVATED BP W/O HYPERTENSION 09/21/2007  . 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 Varicose veins   . DVT (deep venous thrombosis)    Past Surgical History  Procedure Laterality Date  . Thrombectomy Right 01/13/12 and  01/21/12    Right iliac vein stent and Vein thrombosis  . Cholecystectomy  Feb. 2006    Gall Bladder   History  Substance Use Topics  . Smoking status: Never Smoker   .  Smokeless tobacco: Never Used  . Alcohol Use: No   Family History  Problem Relation Age of Onset  . Arthritis Mother   . Cancer Mother   . Heart disease Father     Heart Disease before age 5  . Cancer Father   . Hypertension Father   . Heart attack Father    Allergies  Allergen Reactions  . Penicillins Rash  . Sulfa Antibiotics Rash   Current Outpatient Prescriptions on File Prior to Visit  Medication Sig Dispense Refill  . NON FORMULARY Support hose to the waist for dx of venous insufficiency and edema 15-20 mm Hg     No current facility-administered medications on file prior to visit.    Review of Systems Review of Systems  Constitutional: Negative for fever, appetite change, and unexpected weight change.  ENT pos for cong and rhinorrhea and sinus pain  Eyes: Negative for pain and visual disturbance.  Respiratory: Negative for wheeze  and shortness of breath.   Cardiovascular: Negative for cp or palpitations    Gastrointestinal: Negative for nausea, diarrhea and constipation.  Genitourinary: Negative for urgency and frequency.  Skin: Negative for pallor or rash   Neurological: Negative for weakness, light-headedness, numbness and headaches.  Hematological: Negative for adenopathy. Does not bruise/bleed easily.  Psychiatric/Behavioral: Negative for dysphoric mood. The patient is not nervous/anxious.         Objective:   Physical Exam  Constitutional: She appears well-developed and well-nourished.  No distress.  obese and well appearing   HENT:  Head: Normocephalic and atraumatic.  Right Ear: External ear normal.  Left Ear: External ear normal.  Mouth/Throat: Oropharynx is clear and moist. No oropharyngeal exudate.  Nares are injected and congested  Bilateral frontal and maxillary sinus tenderness Throat clear   Eyes: Conjunctivae and EOM are normal. Pupils are equal, round, and reactive to light. Right eye exhibits no discharge. Left eye exhibits no discharge.    Neck: Normal range of motion. Neck supple.  Cardiovascular: Normal rate and regular rhythm.   Pulmonary/Chest: Effort normal and breath sounds normal. No respiratory distress. She has no wheezes. She has no rales.  Lymphadenopathy:    She has no cervical adenopathy.  Neurological: She is alert.  Skin: Skin is warm and dry. No rash noted.  Psychiatric: She has a normal mood and affect.          Assessment & Plan:   Problem List Items Addressed This Visit      Respiratory   Acute sinusitis - Primary    With viral uri  Cover with zithromax  Disc symptomatic care - see instructions on AVS  Update if not starting to improve in a week or if worsening        Relevant Medications   azithromycin (ZITHROMAX) tablet

## 2014-05-04 NOTE — Assessment & Plan Note (Signed)
With viral uri  Cover with zithromax  Disc symptomatic care - see instructions on AVS  Update if not starting to improve in a week or if worsening

## 2014-05-29 ENCOUNTER — Encounter: Payer: Self-pay | Admitting: Family Medicine

## 2014-05-29 ENCOUNTER — Other Ambulatory Visit (HOSPITAL_COMMUNITY)
Admission: RE | Admit: 2014-05-29 | Discharge: 2014-05-29 | Disposition: A | Discharge status: Home / Self Care | Payer: Managed Care, Other (non HMO) | Source: Ambulatory Visit | Attending: Family Medicine | Admitting: Family Medicine

## 2014-05-29 ENCOUNTER — Ambulatory Visit (INDEPENDENT_AMBULATORY_CARE_PROVIDER_SITE_OTHER): Payer: Managed Care, Other (non HMO) | Admitting: Family Medicine

## 2014-05-29 VITALS — BP 118/82 | HR 82 | Temp 98.1°F | Ht 64.0 in | Wt 219.1 lb

## 2014-05-29 DIAGNOSIS — Z1151 Encounter for screening for human papillomavirus (HPV): Secondary | ICD-10-CM | POA: Diagnosis present

## 2014-05-29 DIAGNOSIS — Z114 Encounter for screening for human immunodeficiency virus [HIV]: Secondary | ICD-10-CM

## 2014-05-29 DIAGNOSIS — Z1231 Encounter for screening mammogram for malignant neoplasm of breast: Secondary | ICD-10-CM

## 2014-05-29 DIAGNOSIS — Z01419 Encounter for gynecological examination (general) (routine) without abnormal findings: Secondary | ICD-10-CM

## 2014-05-29 DIAGNOSIS — Z0000 Encounter for general adult medical examination without abnormal findings: Secondary | ICD-10-CM

## 2014-05-29 DIAGNOSIS — E669 Obesity, unspecified: Secondary | ICD-10-CM

## 2014-05-29 NOTE — Assessment & Plan Note (Signed)
Reviewed health habits including diet and exercise and skin cancer prevention Reviewed appropriate screening tests for age  Also reviewed health mt list, fam hx and immunization status , as well as social and family history    See HPI Working on a wt loss plan  Lab today  HIV screen today

## 2014-05-29 NOTE — Patient Instructions (Addendum)
Labs today  Pap test today  Take care of yourself  Work on weight loss

## 2014-05-29 NOTE — Assessment & Plan Note (Signed)
Pap and exam done Hx of chronic vaginitis in the past  No complaints

## 2014-05-29 NOTE — Assessment & Plan Note (Signed)
Scheduled annual screening mammogram Nl breast exam today  Encouraged monthly self exams   

## 2014-05-29 NOTE — Progress Notes (Signed)
Pre visit review using our clinic review tool, if applicable. No additional management support is needed unless otherwise documented below in the visit note. 

## 2014-05-29 NOTE — Progress Notes (Signed)
Subjective:    Patient ID: Kari Rice, female    DOB: 07-23-1975, 39 y.o.   MRN: [id number]  HPI Here for health maintenance exam and to review chronic medical problems    Feeling pretty good  Aches and pains - esp with her thumb (R one stays swollen) - uses a polar ice wrap - has appt at end of the month  Saw ortho about knee already - meloxicam is helping some   Wt is up 3 lb with bmi of 37  HIV status - is ok with screening   Pap 12/09 nl  Not seeing gyn  Wants to do pap today  No vaginitis symptoms now (doing better)    Flu shot 1/16 Td 12/09  Due for labs   She plans to start a diet plan with portion control and 6 meals per day with lean protein and vegetables  Has a book to help follow with recipe ideas- this really worked for her in the past  With knee- a bit of limited exercise- will begin some home PT exercises however  Still walking also   Patient Active Problem List   Diagnosis Date Noted  . Routine general medical examination at a health care facility 05/29/2014  . Encounter for routine gynecological examination 05/29/2014  . Screening for HIV (human immunodeficiency virus) 05/29/2014  . Encounter for screening mammogram for breast cancer 05/29/2014  . Obesity 05/29/2014  . Pain of right thumb 04/10/2014  . Left knee pain 04/10/2014  . Joint pain 04/10/2014  . DVT (deep venous thrombosis) 07/06/2012  . Varicose veins of lower extremities with other complications 33/29/5188  . Right leg pain 05/09/2012  . 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 Acute venous embolism and thrombosis of deep vessels of proximal lower extremity 01/13/2012  . Unspecified venous (peripheral) insufficiency 12/23/2011  . Venous insufficiency 11/25/2011  . Phlebitis and thrombophlebitis of the leg 11/06/2011  . Venous insufficiency, peripheral 10/26/2011  . Pedal edema 10/26/2011  . Pain in joint, lower leg 06/04/2010   . NECK PAIN 10/21/2009  . IRREGULAR MENSES 02/29/2008  . UNSPECIFIED VAGINITIS AND VULVOVAGINITIS 01/03/2008  . 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 Varicose veins   . DVT (deep venous thrombosis)    Past Surgical History  Procedure Laterality Date  . Thrombectomy Right 01/13/12 and  01/21/12    Right iliac vein stent and Vein thrombosis  . Cholecystectomy  Feb. 2006    Gall Bladder   History  Substance Use Topics  . Smoking status: Never Smoker   . Smokeless tobacco: Never Used  . Alcohol Use: No   Family History  Problem Relation Age of Onset  . Arthritis Mother   . Cancer Mother   . Heart disease Father     Heart Disease before age 52  . Cancer Father   . Hypertension Father   . Heart attack Father    Allergies  Allergen Reactions  . Penicillins Rash  . Sulfa Antibiotics Rash   Current Outpatient Prescriptions on File Prior to Visit  Medication Sig Dispense Refill  . NON FORMULARY Support hose to the waist for dx of venous insufficiency and edema 15-20 mm Hg     No current facility-administered medications on file prior to visit.     Review of Systems    Review of Systems  Constitutional: Negative for fever, appetite change, fatigue and unexpected weight change.  Eyes: Negative for pain and visual disturbance.  Respiratory: Negative for cough and shortness of breath.   Cardiovascular: Negative for cp or palpitations  Pos for varicosities   Gastrointestinal: Negative for nausea, diarrhea and constipation.  Genitourinary: Negative for urgency and frequency.  Skin: Negative for pallor or rash   MSK pos for knee and thumb pain  Neurological: Negative for weakness, light-headedness, numbness and headaches.  Hematological: Negative for adenopathy. Does not bruise/bleed easily.  Psychiatric/Behavioral: Negative for dysphoric mood. The patient is not nervous/anxious.      Objective:   Physical Exam  Constitutional: She  appears well-developed and well-nourished. No distress.  obese and well appearing   HENT:  Head: Normocephalic and atraumatic.  Right Ear: External ear normal.  Left Ear: External ear normal.  Mouth/Throat: Oropharynx is clear and moist.  Eyes: Conjunctivae and EOM are normal. Pupils are equal, round, and reactive to light. No scleral icterus.  Neck: Normal range of motion. Neck supple. No JVD present. Carotid bruit is not present. No thyromegaly present.  Cardiovascular: Normal rate, regular rhythm, normal heart sounds and intact distal pulses.  Exam reveals no gallop.   LE varicosities worse on R without inflammatory changes   Pulmonary/Chest: Effort normal and breath sounds normal. No respiratory distress. She has no wheezes. She exhibits no tenderness.  Abdominal: Soft. Bowel sounds are normal. She exhibits no distension, no abdominal bruit and no mass. There is no tenderness.  Genitourinary: No breast swelling, tenderness, discharge or bleeding. There is no rash, tenderness or lesion on the right labia. There is no rash, tenderness or lesion on the left labia. Uterus is not enlarged and not tender. Cervix exhibits no motion tenderness, no discharge and no friability. Right adnexum displays no mass, no tenderness and no fullness. Left adnexum displays no mass, no tenderness and no fullness. No bleeding in the vagina. No vaginal discharge found.  Breast exam: No mass, nodules, thickening, tenderness, bulging, retraction, inflamation, nipple discharge or skin changes noted.  No axillary or clavicular LA.       Musculoskeletal: Normal range of motion. She exhibits no edema or tenderness.  Lymphadenopathy:    She has no cervical adenopathy.  Neurological: She is alert. She has normal reflexes. No cranial nerve deficit. She exhibits normal muscle tone. Coordination normal.  Skin: Skin is warm and dry. No rash noted. No erythema. No pallor.  Psychiatric: She has a normal mood and affect.           Assessment & Plan:   Problem List Items Addressed This Visit      Other   Encounter for routine gynecological examination    Pap and exam done Hx of chronic vaginitis in the past  No complaints       Relevant Orders   Cytology - PAP   Encounter for screening mammogram for breast cancer    Scheduled annual screening mammogram Nl breast exam today  Encouraged monthly self exams        Relevant Orders   MM DIGITAL SCREENING BILATERAL   Obesity    Discussed how this problem influences overall health and the risks it imposes  Reviewed plan for weight loss with lower calorie diet (via better food choices and also portion control or program like weight watchers) and exercise building up to or more than 30 minutes 5 days per week including some aerobic activity   Pt is starting back on eating plan with 6 small meals daily - lean protein and healthy choices  Routine general medical examination at a health care facility - Primary    Reviewed health habits including diet and exercise and skin cancer prevention Reviewed appropriate screening tests for age  Also reviewed health mt list, fam hx and immunization status , as well as social and family history    See HPI Working on a wt loss plan  Lab today  HIV screen today      Relevant Orders   CBC with Differential/Platelet   Comprehensive metabolic panel   TSH   Lipid panel   Screening for HIV (human immunodeficiency virus)    Screening today with labs Not high risk      Relevant Orders   HIV antibody (with reflex)

## 2014-05-29 NOTE — Assessment & Plan Note (Signed)
Screening today with labs Not high risk

## 2014-05-29 NOTE — Assessment & Plan Note (Signed)
Discussed how this problem influences overall health and the risks it imposes  Reviewed plan for weight loss with lower calorie diet (via better food choices and also portion control or program like weight watchers) and exercise building up to or more than 30 minutes 5 days per week including some aerobic activity   Pt is starting back on eating plan with 6 small meals daily - lean protein and healthy choices

## 2014-05-30 LAB — CBC WITH DIFFERENTIAL/PLATELET
Basophils Absolute: 0 10*3/uL (ref 0.0–0.2)
Basos: 0 %
Eos: 4 %
Eosinophils Absolute: 0.5 10*3/uL — ABNORMAL HIGH (ref 0.0–0.4)
HCT: 39.7 % (ref 34.0–46.6)
Hemoglobin: 13.5 g/dL (ref 11.1–15.9)
IMMATURE GRANS (ABS): 0 10*3/uL (ref 0.0–0.1)
IMMATURE GRANULOCYTES: 0 %
LYMPHS: 25 %
Lymphocytes Absolute: 3.1 10*3/uL (ref 0.7–3.1)
MCH: 29.2 pg (ref 26.6–33.0)
MCHC: 34 g/dL (ref 31.5–35.7)
MCV: 86 fL (ref 79–97)
MONOCYTES: 8 %
MONOS ABS: 1 10*3/uL — AB (ref 0.1–0.9)
Neutrophils Absolute: 7.6 10*3/uL — ABNORMAL HIGH (ref 1.4–7.0)
Neutrophils Relative %: 63 %
PLATELETS: 314 10*3/uL (ref 150–379)
RBC: 4.63 x10E6/uL (ref 3.77–5.28)
RDW: 13.3 % (ref 12.3–15.4)
WBC: 12.4 10*3/uL — AB (ref 3.4–10.8)

## 2014-05-30 LAB — COMPREHENSIVE METABOLIC PANEL
ALT: 14 IU/L (ref 0–32)
AST: 14 IU/L (ref 0–40)
Albumin/Globulin Ratio: 1.4 (ref 1.1–2.5)
Albumin: 3.9 g/dL (ref 3.5–5.5)
Alkaline Phosphatase: 105 IU/L (ref 39–117)
BUN/Creatinine Ratio: 17 (ref 9–23)
BUN: 11 mg/dL (ref 6–24)
Bilirubin Total: 0.3 mg/dL (ref 0.0–1.2)
CALCIUM: 8.9 mg/dL (ref 8.7–10.2)
CO2: 22 mmol/L (ref 18–29)
Chloride: 101 mmol/L (ref 97–108)
Creatinine, Ser: 0.63 mg/dL (ref 0.57–1.00)
GFR calc Af Amer: 129 mL/min/{1.73_m2} (ref >59)
GFR, EST NON AFRICAN AMERICAN: 112 mL/min/{1.73_m2} (ref >59)
GLUCOSE: 89 mg/dL (ref 65–99)
Globulin, Total: 2.7 g/dL (ref 1.5–4.5)
Potassium: 4.1 mmol/L (ref 3.5–5.2)
Sodium: 140 mmol/L (ref 134–144)
TOTAL PROTEIN: 6.6 g/dL (ref 6.0–8.5)

## 2014-05-30 LAB — LIPID PANEL
Chol/HDL Ratio: 3.1 ratio units (ref 0.0–4.4)
Cholesterol, Total: 154 mg/dL (ref 100–199)
HDL: 49 mg/dL (ref >39)
LDL Calculated: 83 mg/dL (ref 0–99)
Triglycerides: 112 mg/dL (ref 0–149)
VLDL CHOLESTEROL CAL: 22 mg/dL (ref 5–40)

## 2014-05-30 LAB — CYTOLOGY - PAP

## 2014-05-30 LAB — TSH: TSH: 2.6 u[IU]/mL (ref 0.450–4.500)

## 2014-05-30 LAB — HIV ANTIBODY (ROUTINE TESTING W REFLEX): HIV Screen 4th Generation wRfx: NONREACTIVE

## 2014-06-01 ENCOUNTER — Telehealth: Payer: Self-pay | Admitting: *Deleted

## 2014-06-01 MED ORDER — METRONIDAZOLE 500 MG PO TABS: 500.0000 mg | ORAL_TABLET | Freq: Two times a day (BID) | ORAL | Status: DC

## 2014-06-01 NOTE — Telephone Encounter (Signed)
Let her know I sent flagyl to her [facility] Thanks

## 2014-06-01 NOTE — Telephone Encounter (Signed)
Spoke to pt and informed her Rx sent to requested pharmacy

## 2014-06-01 NOTE — Telephone Encounter (Signed)
Pt contacted office and states that after reviewing her records, she was placed on flagyl not doxy for her previous trich infections

## 2014-06-04 ENCOUNTER — Ambulatory Visit
Admission: RE | Admit: 2014-06-04 | Discharge: 2014-06-04 | Disposition: A | Discharge status: Home / Self Care | Payer: Managed Care, Other (non HMO) | Source: Ambulatory Visit | Attending: Family Medicine | Admitting: Family Medicine

## 2014-06-04 ENCOUNTER — Encounter (INDEPENDENT_AMBULATORY_CARE_PROVIDER_SITE_OTHER): Payer: Self-pay

## 2014-06-04 DIAGNOSIS — Z1231 Encounter for screening mammogram for malignant neoplasm of breast: Secondary | ICD-10-CM

## 2014-06-27 ENCOUNTER — Other Ambulatory Visit: Payer: Self-pay | Admitting: Family Medicine

## 2014-06-27 DIAGNOSIS — D72829 Elevated white blood cell count, unspecified: Secondary | ICD-10-CM

## 2014-07-02 ENCOUNTER — Other Ambulatory Visit (INDEPENDENT_AMBULATORY_CARE_PROVIDER_SITE_OTHER): Payer: Managed Care, Other (non HMO)

## 2014-07-02 DIAGNOSIS — D72829 Elevated white blood cell count, unspecified: Secondary | ICD-10-CM

## 2014-07-03 ENCOUNTER — Telehealth: Payer: Self-pay

## 2014-07-03 LAB — CBC WITH DIFFERENTIAL/PLATELET
BASOS ABS: 0 10*3/uL (ref 0.0–0.2)
Basos: 0 %
Eos: 4 %
Eosinophils Absolute: 0.4 10*3/uL (ref 0.0–0.4)
HCT: 40.8 % (ref 34.0–46.6)
Hemoglobin: 13.2 g/dL (ref 11.1–15.9)
IMMATURE GRANULOCYTES: 0 %
Immature Grans (Abs): 0 10*3/uL (ref 0.0–0.1)
LYMPHS: 31 %
Lymphocytes Absolute: 2.7 10*3/uL (ref 0.7–3.1)
MCH: 28.8 pg (ref 26.6–33.0)
MCHC: 32.4 g/dL (ref 31.5–35.7)
MCV: 89 fL (ref 79–97)
MONOCYTES: 7 %
Monocytes Absolute: 0.6 10*3/uL (ref 0.1–0.9)
NEUTROS PCT: 58 %
Neutrophils Absolute: 5 10*3/uL (ref 1.4–7.0)
PLATELETS: 281 10*3/uL (ref 150–379)
RBC: 4.59 x10E6/uL (ref 3.77–5.28)
RDW: 13.7 % (ref 12.3–15.4)
WBC: 8.6 10*3/uL (ref 3.4–10.8)

## 2014-07-03 NOTE — Telephone Encounter (Signed)
Patient aware of lab results.

## 2014-07-03 NOTE — Telephone Encounter (Signed)
-----   Message from Abner Greenspan, MD sent at 07/03/2014  8:00 AM EDT ----- White blood cell count is normal

## 2014-08-10 ENCOUNTER — Encounter: Payer: Self-pay | Admitting: Family Medicine

## 2014-08-10 ENCOUNTER — Ambulatory Visit (INDEPENDENT_AMBULATORY_CARE_PROVIDER_SITE_OTHER): Payer: Managed Care, Other (non HMO) | Admitting: Family Medicine

## 2014-08-10 VITALS — BP 120/89 | HR 74 | Temp 98.5°F | Ht 64.0 in | Wt 200.5 lb

## 2014-08-10 DIAGNOSIS — M79644 Pain in right finger(s): Secondary | ICD-10-CM | POA: Diagnosis not present

## 2014-08-10 DIAGNOSIS — M79645 Pain in left finger(s): Secondary | ICD-10-CM | POA: Diagnosis not present

## 2014-08-10 MED ORDER — TRAMADOL HCL 50 MG PO TABS
50.0000 mg | ORAL_TABLET | Freq: Every evening | ORAL | Status: DC | PRN
Start: 2014-08-10 — End: 2015-09-16

## 2014-08-10 NOTE — Progress Notes (Signed)
Subjective:     Patient ID: Kari Rice, female   DOB: 03/25/1976, 39 y.o.   MRN: [id number]  HPI Kari Rice is a 39 y/o F  presenting with hand pain in both hands. Started having pain in right hand in February, went to orthopedist who told her it was arthritis. Had a cortisone injection. Shortly after, had an injection in left hand for similar sx. Injections helped with swelling, did not help with pain. Has started having more intense pain recently, especially at night, pain is keeping her up at night. Is taking tylenol and meloxicam with little relief. An ice pack at night used to help, but isn't helping much anymore. Has an MRI scheduled for Monday for left hand at [facility]. Is also complaining of swelling in right thumb joint and left 3rd MCP. Says that her joints do not become red when they are swollen. Swelling is worst in the mornings, goes down some as the day goes on and she uses her hands. No fevers.   X-ray of right thumb from 12/15 showed mild degenerative changes. Per patient, x-ray of L hand showed no abnormalities and MRI has been scheduled next week to further evaluate.  Review of Systems  Constitutional: Negative for fever.  Respiratory: Negative for shortness of breath.   Cardiovascular: Negative for chest pain.       Objective:   Physical Exam  Constitutional: She appears well-developed and well-nourished.  HENT:  Head: Normocephalic and atraumatic.  Musculoskeletal:  Tenderness to palpation over R 1st MCP and L 3rd MCP Swelling of the R 1st MCP and L 3rd MCP  decrease ROM in thumb and 3rd MCP joint  Psychiatric: She has a normal mood and affect. Her behavior is normal.       Assessment:     1. 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 2. L 3rd MCP pain could also be due to OA but has MRI pending.    Plan:      Will try tramadol while completing work up  with orthopedist.      Dante Gang served as scribe for Dr. Diona Browner 08/10/14 10:15 am  Patient seen and examined. Med student acted as Education administrator only.  HPI/ROS/PE and assessment/plan created  By MD and scribed by med student.  Eliezer Lofts MD

## 2014-08-10 NOTE — Progress Notes (Signed)
Pre visit review using our clinic review tool, if applicable. No additional management support is needed unless otherwise documented below in the visit note. 

## 2014-08-21 ENCOUNTER — Telehealth: Payer: Self-pay | Admitting: Family Medicine

## 2014-08-21 DIAGNOSIS — Z7689 Persons encountering health services in other specified circumstances: Secondary | ICD-10-CM

## 2014-08-21 NOTE — Telephone Encounter (Signed)
Pt dropped off FMLA paperwork FMLA paperwork IN dr Arley Phenix IN BOX For review and signature

## 2014-08-21 NOTE — Telephone Encounter (Signed)
Completed in outbox.

## 2014-08-22 ENCOUNTER — Ambulatory Visit (INDEPENDENT_AMBULATORY_CARE_PROVIDER_SITE_OTHER): Payer: Managed Care, Other (non HMO) | Admitting: Family Medicine

## 2014-08-22 ENCOUNTER — Encounter: Payer: Self-pay | Admitting: Family Medicine

## 2014-08-22 VITALS — BP 128/84 | HR 85 | Temp 98.3°F | Ht 64.0 in | Wt 198.5 lb

## 2014-08-22 DIAGNOSIS — M79642 Pain in left hand: Secondary | ICD-10-CM | POA: Insufficient documentation

## 2014-08-22 MED ORDER — MELOXICAM 15 MG PO TABS: 15.0000 mg | ORAL_TABLET | Freq: Every day | ORAL | Status: DC

## 2014-08-22 NOTE — Progress Notes (Signed)
Pre visit review using our clinic review tool, if applicable. No additional management support is needed unless otherwise documented below in the visit note. 

## 2014-08-22 NOTE — Progress Notes (Signed)
Subjective:    Patient ID: Kari Rice, female    DOB: Feb 17, 1976, 39 y.o.   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 - is happy  Not able to exercise as much - eating less when she cannot exercise   Was walking -knees started bothering her - goes on and off of that   Patient Active Problem List   Diagnosis Date Noted  . Finger pain, left 08/10/2014  . Routine general medical examination at a health care facility 05/29/2014  . Encounter for routine gynecological examination 05/29/2014  . Screening for HIV (human immunodeficiency virus) 05/29/2014  . Encounter for screening mammogram for breast cancer 05/29/2014  . Obesity 05/29/2014  . Pain of right thumb 04/10/2014  . Left knee pain 04/10/2014  . Joint pain 04/10/2014  . DVT (deep venous thrombosis) 07/06/2012  . Varicose veins of lower extremities with other complications 53/29/9242  . Right leg pain 05/09/2012  . 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 Acute venous embolism and thrombosis of deep vessels of proximal lower extremity 01/13/2012  . Unspecified venous (peripheral) insufficiency 12/23/2011  . Venous insufficiency 11/25/2011  . Phlebitis and thrombophlebitis of the leg  11/06/2011  . Venous insufficiency, peripheral 10/26/2011  . Pedal edema 10/26/2011  . Pain in joint, lower leg 06/04/2010  . NECK PAIN 10/21/2009  . IRREGULAR MENSES 02/29/2008  . UNSPECIFIED VAGINITIS AND VULVOVAGINITIS 01/03/2008  . 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 Varicose veins   . DVT (deep venous thrombosis)    Past Surgical History  Procedure Laterality Date  . Thrombectomy Right 01/13/12 and  01/21/12    Right iliac vein stent and Vein thrombosis  . Cholecystectomy  Feb. 2006    Gall Bladder   History  Substance Use Topics  . Smoking status: Never Smoker   . Smokeless tobacco: Never Used  . Alcohol Use: No   Family History  Problem Relation Age of Onset  . Arthritis Mother   . Cancer Mother   . Heart disease Father     Heart Disease before age 22  . Cancer Father   . Hypertension Father   . Heart attack Father    Allergies  Allergen Reactions  . Penicillins Rash  . Sulfa Antibiotics Rash   Current Outpatient Prescriptions on File Prior to Visit  Medication Sig Dispense Refill  . acetaminophen (TYLENOL) 650 MG CR tablet Take 650 mg by mouth every 8 (eight) hours as needed for pain.    . meloxicam (MOBIC) 15 MG tablet Take 15 mg by mouth daily.    Marland Kitchen  NON FORMULARY Support hose to the waist for dx of venous insufficiency and edema 15-20 mm Hg    . traMADol (ULTRAM) 50 MG tablet Take 1 tablet (50 mg total) by mouth at bedtime as needed. 30 tablet 0   No current facility-administered medications on file prior to visit.    Review of Systems Review of Systems  Constitutional: Negative for fever, appetite change, fatigue and unexpected weight change.  Eyes: Negative for pain and visual disturbance.  Respiratory: Negative for cough and shortness of breath.   Cardiovascular: Negative for cp or palpitations    Gastrointestinal: Negative for nausea, diarrhea and constipation.  Genitourinary: Negative for urgency and  frequency.  Skin: Negative for pallor or rash   MSK pos for L hand pain and swelling  Neurological: Negative for weakness, light-headedness, numbness and headaches.  Hematological: Negative for adenopathy. Does not bruise/bleed easily.  Psychiatric/Behavioral: Negative for dysphoric mood. The patient is not nervous/anxious.         Objective:   Physical Exam  Constitutional: She appears well-developed and well-nourished. No distress.  obese and well appearing   HENT:  Head: Normocephalic and atraumatic.  Mouth/Throat: Oropharynx is clear and moist.  Eyes: Conjunctivae and EOM are normal. Pupils are equal, round, and reactive to light.  Neck: Normal range of motion. Neck supple.  Cardiovascular: Normal rate and regular rhythm.   Musculoskeletal: She exhibits edema and tenderness.  Swelling of palm just proximal to base of 3rd and 4th digits Pt has pain on full flexion of fingers and cannot make a fist without discomfort  Nl sensation and perfusion   Lymphadenopathy:    She has no cervical adenopathy.  Neurological: She is alert. She has normal reflexes. She exhibits normal muscle tone.  Skin: Skin is warm and dry. No rash noted. She is not diaphoretic. No erythema. No pallor.  Psychiatric: She has a normal mood and affect.          Assessment & Plan:   Problem List Items Addressed This Visit    Left hand pain - Primary    Reviewed hx with pt  She will f/u with orthopedics for procedure (? bx vs other)- L hand on Wed Sent px for meloxicam [facility] - she may need intermittently for knee problems in the future as well

## 2014-08-22 NOTE — Assessment & Plan Note (Signed)
Reviewed hx with pt  She will f/u with orthopedics for procedure (? bx vs other)- L hand on Wed Sent px for meloxicam [facility] - she may need intermittently for knee problems in the future as well

## 2014-08-22 NOTE — Patient Instructions (Addendum)
Continue current medications- tramadol as needed mobic daily and tylenol as needed  Use a cold compress /ice -for pain and inflammation  I sent meloxicam [facility]

## 2014-08-22 NOTE — Telephone Encounter (Signed)
Pt aware paperwork ready for pick Faxed to [phone]  Copy for pt Copy for billing Copy for scan Copy for file

## 2014-10-10 ENCOUNTER — Other Ambulatory Visit: Payer: Self-pay | Admitting: Family Medicine

## 2014-10-10 NOTE — Telephone Encounter (Signed)
Please refill for 6 mo 

## 2014-10-10 NOTE — Telephone Encounter (Signed)
Last OV was 08/22/14 for swollen hand, last refilled 08/22/14 #90 with 0 refills, please advise

## 2014-10-10 NOTE — Telephone Encounter (Signed)
done

## 2015-01-08 ENCOUNTER — Telehealth: Payer: Self-pay | Admitting: Family Medicine

## 2015-01-08 NOTE — Telephone Encounter (Signed)
Form in your inbox 

## 2015-01-08 NOTE — Telephone Encounter (Signed)
Pt dropped off form for work. Please call [phone] when ready to pick up.  Thanks Placing on [employer]

## 2015-01-09 DIAGNOSIS — Z7689 Persons encountering health services in other specified circumstances: Secondary | ICD-10-CM

## 2015-01-09 NOTE — Telephone Encounter (Signed)
Pt notified form ready for pick-up and advise of fee

## 2015-01-09 NOTE — Telephone Encounter (Signed)
Done and in IN box  I signed in the wrong place at first- sorry- I drew a line through that and initialed it

## 2015-05-17 ENCOUNTER — Other Ambulatory Visit: Payer: Self-pay | Admitting: Family Medicine

## 2015-05-17 NOTE — Telephone Encounter (Signed)
Pt had an acute appt on 08/22/14 and no f/u or CPE since, last refilled on 10/10/14 #90 with 1 additional refills, please advise

## 2015-05-18 NOTE — Telephone Encounter (Signed)
Will refill electronically  

## 2015-09-16 ENCOUNTER — Ambulatory Visit (INDEPENDENT_AMBULATORY_CARE_PROVIDER_SITE_OTHER): Payer: Managed Care, Other (non HMO) | Admitting: Family Medicine

## 2015-09-16 ENCOUNTER — Encounter: Payer: Self-pay | Admitting: Family Medicine

## 2015-09-16 VITALS — BP 126/86 | HR 73 | Temp 98.2°F | Ht 64.0 in | Wt 216.2 lb

## 2015-09-16 DIAGNOSIS — S91209A Unspecified open wound of unspecified toe(s) with damage to nail, initial encounter: Secondary | ICD-10-CM | POA: Diagnosis not present

## 2015-09-16 NOTE — Progress Notes (Signed)
Pre visit review using our clinic review tool, if applicable. No additional management support is needed unless otherwise documented below in the visit note. 

## 2015-09-16 NOTE — Assessment & Plan Note (Signed)
With growth under nail bed - resembling pyogenic granuloma  occ bleeds and drains Ref to podiatry for eval  inst to keep clean with soap and water  Loose covering  Update if symptoms worsen in the meantime

## 2015-09-16 NOTE — Patient Instructions (Signed)
Stop at check out for referral to podiatry  Keep your toe clean and dry (soap and water)  Use an over the counter antibiotic ointment  Watch for redness/ swelling  Keep it loosely covered  Wear a more supportive shoe

## 2015-09-16 NOTE — Progress Notes (Signed)
Subjective:    Patient ID: Kari Rice, female    DOB: 02-24-1976, 40 y.o.   MRN: [id number]  HPI Here with problem with R great toe   Hx of blood clot in her R leg  Then she got a "blood clot"- under R toenail - ? Bruise/unsure   A month ago - it looked bruised again and brown fluid ran out from under her nail  She tried to trim and file it  It continues to drain  Now starting to loose her nail  occ bleeds a bit  Was uncomfortable on Sunday am -minimally painful   No trauma to toe or nail  Did have some new shoes with memory foam - not quite as supportive   Patient Active Problem List   Diagnosis Date Noted  . Avulsed toenail 09/16/2015  . Left hand pain 08/22/2014  . Finger pain, left 08/10/2014  . Routine general medical examination at a health care facility 05/29/2014  . Encounter for routine gynecological examination 05/29/2014  . Screening for HIV (human immunodeficiency virus) 05/29/2014  . Encounter for screening mammogram for breast cancer 05/29/2014  . Obesity 05/29/2014  . Pain of right thumb 04/10/2014  . Left knee pain 04/10/2014  . Joint pain 04/10/2014  . DVT (deep venous thrombosis) ([place]) 07/06/2012  . Varicose veins of lower extremities with other complications [id number]  . Right leg pain 05/09/2012  . 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 Acute venous embolism and thrombosis of deep vessels of proximal lower extremity ([place]) 01/13/2012  . Unspecified venous (peripheral) insufficiency 12/23/2011  . Venous insufficiency 11/25/2011  . Phlebitis and thrombophlebitis of the leg 11/06/2011  . Venous insufficiency, peripheral 10/26/2011  . Pedal edema 10/26/2011  . Pain in joint, lower leg 06/04/2010  . NECK PAIN 10/21/2009  . IRREGULAR MENSES 02/29/2008  . UNSPECIFIED VAGINITIS AND VULVOVAGINITIS 01/03/2008  . 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  Varicose veins   . DVT (deep venous thrombosis) [facility])    Past Surgical History  Procedure Laterality Date  . Thrombectomy Right 01/13/12 and  01/21/12    Right iliac vein stent and Vein thrombosis  . Cholecystectomy  Feb. 2006    Gall Bladder   Social History  Substance Use Topics  . Smoking status: Never Smoker   . Smokeless tobacco: Never Used  . Alcohol Use: No   Family History  Problem Relation Age of Onset  . Arthritis Mother   . Cancer Mother   . Heart disease Father     Heart Disease before age 33  . Cancer Father   . Hypertension Father   . Heart attack Father    Allergies  Allergen Reactions  . Penicillins Rash  . Sulfa Antibiotics Rash   Current Outpatient Prescriptions on File Prior to Visit  Medication Sig Dispense Refill  . acetaminophen (TYLENOL) 650 MG CR tablet Take 650 mg by mouth every 8 (eight) hours as needed for pain.    . meloxicam (MOBIC) 15 MG tablet Take 1 tablet by mouth  daily with food 90 tablet 1  . NON FORMULARY Support hose to the waist for dx of venous insufficiency and edema 15-20 mm Hg     No current facility-administered medications on file prior to visit.    Review of Systems Review of Systems  Constitutional: Negative for fever, appetite change, fatigue and unexpected weight change.  Eyes: Negative  for pain and visual disturbance.  Respiratory: Negative for cough and shortness of breath.   Cardiovascular: Negative for cp or palpitations    Gastrointestinal: Negative for nausea, diarrhea and constipation.  Genitourinary: Negative for urgency and frequency.  Skin: Negative for pallor or rash  pos for lesion under toenail Neurological: Negative for weakness, light-headedness, numbness and headaches.  Hematological: Negative for adenopathy. Does not bruise/bleed easily.  Psychiatric/Behavioral: Negative for dysphoric mood. The patient is not nervous/anxious.         Objective:   Physical Exam  Constitutional: She appears  well-developed and well-nourished. No distress.  Eyes: Conjunctivae and EOM are normal. Pupils are equal, round, and reactive to light. No scleral icterus.  Cardiovascular: Normal rate and regular rhythm.   Musculoskeletal: She exhibits no edema.  Neurological: She is alert.  Skin: Skin is warm and dry.  Bumpy red raised lesion (shiny with some clear drainage) under R great toenail - that has been cut back  No signs of infection   Psychiatric: She has a normal mood and affect.          Assessment & Plan:   Problem List Items Addressed This Visit      Musculoskeletal and Integument   Avulsed toenail - Primary    With growth under nail bed - resembling pyogenic granuloma  occ bleeds and drains Ref to podiatry for eval  inst to keep clean with soap and water  Loose covering  Update if symptoms worsen in the meantime       Relevant Orders   Ambulatory referral to Podiatry

## 2015-10-13 ENCOUNTER — Other Ambulatory Visit: Payer: Self-pay | Admitting: Family Medicine

## 2015-10-14 NOTE — Telephone Encounter (Signed)
Approved: #90 x 0 Seems a little early but okay to send 1 refill

## 2015-10-14 NOTE — Telephone Encounter (Signed)
Received refill electronically Last refill 05/18/15 #90/1 Last office visit 09/16/15

## 2015-12-09 ENCOUNTER — Ambulatory Visit (INDEPENDENT_AMBULATORY_CARE_PROVIDER_SITE_OTHER): Payer: Managed Care, Other (non HMO) | Admitting: Family Medicine

## 2015-12-09 ENCOUNTER — Encounter: Payer: Self-pay | Admitting: Family Medicine

## 2015-12-09 VITALS — BP 110/76 | HR 67 | Temp 98.2°F | Ht 64.0 in | Wt 222.5 lb

## 2015-12-09 DIAGNOSIS — G4710 Hypersomnia, unspecified: Secondary | ICD-10-CM

## 2015-12-09 DIAGNOSIS — T148 Other injury of unspecified body region: Secondary | ICD-10-CM | POA: Diagnosis not present

## 2015-12-09 DIAGNOSIS — E669 Obesity, unspecified: Secondary | ICD-10-CM | POA: Diagnosis not present

## 2015-12-09 DIAGNOSIS — T148XXA Other injury of unspecified body region, initial encounter: Secondary | ICD-10-CM | POA: Insufficient documentation

## 2015-12-09 DIAGNOSIS — R400 Somnolence: Secondary | ICD-10-CM | POA: Insufficient documentation

## 2015-12-09 NOTE — Patient Instructions (Signed)
Try to start exercise  Gradually - get rid of processed sugars and drink water  Limit carbs (white bread /rice/pasta/potato) Make sure you get protein to every meal  Form filled out Stop at check out for ref to pulm for sleep eval  Keep using ice on your leg- it should gradually improve

## 2015-12-09 NOTE — Progress Notes (Signed)
Subjective:    Patient ID: Kari Rice, female    DOB: [birth year], 40 y.o.   MRN: [id number]  HPI Here with L leg pain after a fall a week ago   Also wanted to discuss day time sleepiness Getting enough sleep  Gets very sleepy during the day  Sugar helps Caffeine helps  Is wondering about sleep apnea - thinks she may snore- has woken herself up from snoring  Does not think she has acid reflux but gets a little nauseated occas  (feels it in her throat)  Keeps gaining weight -thinks this may be the reason she can no longer loose   She has to eat sugar to stay from being sleepy In the past - cut salt and also inc water and dec junk food  Has gotten down to 180 in the past Wt Readings from Last 3 Encounters:  12/09/15 222 lb 8 oz (100.9 kg)  09/16/15 216 lb 4 oz (98.1 kg)  08/22/14 198 lb 8 oz (90 kg)   bmi is 38.1 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  Lab Results  Component Value Date   TSH 2.600 05/29/2014     She stepped off the side of a patio and fell She fell on L side and hit the lateral L leg and twisted her ankle Had a bump on leg  Then some swelling over her shin with tenderness to the touch  The bruising is turning yellow  Got really swollen and some bruising in her foot   Most of the discomfort is under L knee and lateral to it  Is stiff after inactivity Ankle seems fine   Wearing some Dr Zoe Lan type of wrap   Put ice on it -helpful   She is still taking meloxicam 15 mg one daily   Lab Results  Component Value Date   CREATININE 0.63 05/29/2014   BUN 11 05/29/2014   NA 140 05/29/2014   K 4.1 05/29/2014   CL 101 05/29/2014   CO2 22 05/29/2014   Has PE in oct      Review of Systems Review of Systems  Constitutional: Negative for fever, appetite change, and unexpected weight change. pos for significant day time sleepiness  Eyes: Negative for pain and visual disturbance.  Respiratory: Negative for cough and shortness  of breath.   Cardiovascular: Negative for cp or palpitations   pos for varicose veins in legs  Gastrointestinal: Negative for  diarrhea and constipation. pos for occ nausea w/o dysphagia  Genitourinary: Negative for urgency and frequency.  Skin: Negative for pallor or rash   MSK pos for soreness of L leg with bruising  Neurological: Negative for weakness, light-headedness, numbness and headaches.  Hematological: Negative for adenopathy. Does not bruise/bleed easily.  Psychiatric/Behavioral: Negative for dysphoric mood. The patient is not nervous/anxious.         Objective:   Physical Exam  Constitutional: She appears well-developed and well-nourished. No distress.  obese and well appearing   HENT:  Head: Normocephalic and atraumatic.  Mouth/Throat: Oropharynx is clear and moist.  Eyes: Conjunctivae and EOM are normal. Pupils are equal, round, and reactive to light.  Neck: Normal range of motion. Neck supple. No JVD present. Carotid bruit is not present. No thyromegaly present.  Cardiovascular: Normal rate, regular rhythm, normal heart sounds and intact distal pulses.  Exam reveals no gallop.   Baseline LE varicosities w/o inflammation or skin change   Pulmonary/Chest: Effort normal  and breath sounds normal. No respiratory distress. She has no wheezes. She has no rales.  No crackles  Abdominal: Soft. Bowel sounds are normal. She exhibits no distension, no abdominal bruit and no mass. There is no tenderness.  Musculoskeletal: She exhibits no edema.  Tender over ant shin and laterally just below knee of L leg Nl rom knee and no effusion or joint line tenderness  Hematoma resolving-with mild induration and yellow color Nl rom ankle Old ecchymosis (dependent) noted at medial lower foot (nontender)   Nl gait   Lymphadenopathy:    She has no cervical adenopathy.  Neurological: She is alert. She has normal reflexes. No cranial nerve deficit. She exhibits normal muscle tone. Coordination  normal.  Skin: Skin is warm and dry. No rash noted. No pallor.  Psychiatric: She has a normal mood and affect.          Assessment & Plan:   Problem List Items Addressed This Visit      Other   Somnolence, daytime    With likely snoring and obesity  Ref to pulmonary for sleep eval-strongly suspect sleep apnea  Enc wt loss        Relevant Orders   Ambulatory referral to Pulmonology   Obesity - Primary    I do wonder potentially if sleep apnea could add to difficulty with weight (inability to loose) Outlined a plan to ref to pulmonary for sleep clinic eval  Also dec processed carbs in diet and portions Plan to start exercising at the gym Form filled out for work       Hematoma    L leg after a fall  Resolving Expect to feel a knot over shin with resolving color change for another month or longer  Bruising at ankle app to be dependent  Reassuring exam-continue to follow        Other Visit Diagnoses   None.

## 2015-12-09 NOTE — Assessment & Plan Note (Signed)
I do wonder potentially if sleep apnea could add to difficulty with weight (inability to loose) Outlined a plan to ref to pulmonary for sleep clinic eval  Also dec processed carbs in diet and portions Plan to start exercising at the gym Form filled out for work

## 2015-12-09 NOTE — Progress Notes (Signed)
Pre visit review using our clinic review tool, if applicable. No additional management support is needed unless otherwise documented below in the visit note. 

## 2015-12-09 NOTE — Assessment & Plan Note (Signed)
With likely snoring and obesity  Ref to pulmonary for sleep eval-strongly suspect sleep apnea  Enc wt loss

## 2015-12-09 NOTE — Assessment & Plan Note (Signed)
L leg after a fall  Resolving Expect to feel a knot over shin with resolving color change for another month or longer  Bruising at ankle app to be dependent  Reassuring exam-continue to follow

## 2015-12-10 ENCOUNTER — Encounter: Payer: Self-pay | Admitting: Internal Medicine

## 2015-12-10 ENCOUNTER — Ambulatory Visit (INDEPENDENT_AMBULATORY_CARE_PROVIDER_SITE_OTHER): Payer: Managed Care, Other (non HMO) | Admitting: Internal Medicine

## 2015-12-10 VITALS — BP 132/88 | HR 88 | Ht 64.0 in | Wt 224.0 lb

## 2015-12-10 DIAGNOSIS — G4719 Other hypersomnia: Secondary | ICD-10-CM

## 2015-12-10 NOTE — Progress Notes (Signed)
leep

## 2015-12-10 NOTE — Patient Instructions (Signed)
Will need to obtain Sleep Study  Sleep Apnea  Sleep apnea is a sleep disorder characterized by abnormal pauses in breathing while you sleep. When your breathing pauses, the level of oxygen in your blood decreases. This causes you to move out of deep sleep and into light sleep. As a result, your quality of sleep is poor, and the system that carries your blood throughout your body (cardiovascular system) experiences stress. If sleep apnea remains untreated, the following conditions can develop:  High blood pressure (hypertension).  Coronary artery disease.  Inability to achieve or maintain an erection (impotence).  Impairment of your thought process (cognitive dysfunction). There are three types of sleep apnea: 1. Obstructive sleep apnea--Pauses in breathing during sleep because of a blocked airway. 2. Central sleep apnea--Pauses in breathing during sleep because the area of the brain that controls your breathing does not send the correct signals to the muscles that control breathing. 3. Mixed sleep apnea--A combination of both obstructive and central sleep apnea. RISK FACTORS The following risk factors can increase your risk of developing sleep apnea:  Being overweight.  Smoking.  Having narrow passages in your nose and throat.  Being of older age.  Being female.  Alcohol use.  Sedative and tranquilizer use.  Ethnicity. Among individuals younger than 35 years, African Americans are at increased risk of sleep apnea. SYMPTOMS   Difficulty staying asleep.  Daytime sleepiness and fatigue.  Loss of energy.  Irritability.  Loud, heavy snoring.  Morning headaches.  Trouble concentrating.  Forgetfulness.  Decreased interest in sex.  Unexplained sleepiness. DIAGNOSIS  In order to diagnose sleep apnea, your caregiver will perform a physical examination. A sleep study done in the comfort of your own home may be appropriate if you are otherwise healthy. Your caregiver may  also recommend that you spend the night in a sleep lab. In the sleep lab, several monitors record information about your heart, lungs, and brain while you sleep. Your leg and arm movements and blood oxygen level are also recorded. TREATMENT The following actions may help to resolve mild sleep apnea:  Sleeping on your side.   Using a decongestant if you have nasal congestion.   Avoiding the use of depressants, including alcohol, sedatives, and narcotics.   Losing weight and modifying your diet if you are overweight. There also are devices and treatments to help open your airway:  Oral appliances. These are custom-made mouthpieces that shift your lower jaw forward and slightly open your bite. This opens your airway.  Devices that create positive airway pressure. This positive pressure "splints" your airway open to help you breathe better during sleep. The following devices create positive airway pressure:  Continuous positive airway pressure (CPAP) device. The CPAP device creates a continuous level of air pressure with an air pump. The air is delivered to your airway through a mask while you sleep. This continuous pressure keeps your airway open.  Nasal expiratory positive airway pressure (EPAP) device. The EPAP device creates positive air pressure as you exhale. The device consists of single-use valves, which are inserted into each nostril and held in place by adhesive. The valves create very little resistance when you inhale but create much more resistance when you exhale. That increased resistance creates the positive airway pressure. This positive pressure while you exhale keeps your airway open, making it easier to breath when you inhale again.  Bilevel positive airway pressure (BPAP) device. The BPAP device is used mainly in patients with central sleep apnea. This device  is similar to the CPAP device because it also uses an air pump to deliver continuous air pressure through a mask.  However, with the BPAP machine, the pressure is set at two different levels. The pressure when you exhale is lower than the pressure when you inhale.  Surgery. Typically, surgery is only done if you cannot comply with less invasive treatments or if the less invasive treatments do not improve your condition. Surgery involves removing excess tissue in your airway to create a wider passage way.   This information is not intended to replace advice given to you by your health care provider. Make sure you discuss any questions you have with your health care provider.   Document Released: 03/20/2002 Document Revised: 04/20/2014 Document Reviewed: 08/06/2011 Elsevier Interactive Patient Education [employer].

## 2015-12-10 NOTE — Progress Notes (Signed)
[facility] Consultation      Date: 12/10/2015,   MRN# [id number] Kari Rice March 16, 1976 Code Status:  Code Status History    This patient does not have a recorded code status. Please follow your organizational policy for patients in this situation.     Hosp day:@LENGTHOFSTAYDAYS @ Referring MD: @ATDPROV @     PCP:      AdmissionWeight: 224 lb (101.6 kg)                 CurrentWeight: 224 lb (101.6 kg) Kari Rice is a 40 y.o. old female seen in consultation for problems with sleeping at the request of DR Tower     CHIEF COMPLAINT:  Problems sleeping    HISTORY OF PRESENT ILLNESS  40 yo pleasant white female seen today for problems sleeping for over 1 year She has had restless sleep for past year  associated with daytime sleepiness and excessive fatigue throughout the day She is single, has not been told of loud snoring however, she does wake up at times gasping for air She has fatigue through out the day that worsens as day progresses, caffeine and sugar keep her awake She has no acute resp issues or cardiac issues at this time She has gained 26 pounds in 1 year She works as Corporate investment banker at [employer] She is single, nonsmoker, exposed to second hand smoke Previous DVT in 2013 unknown reason Father with COPD on oxygen thetrapy       PAST MEDICAL HISTORY   Past Medical History:  Diagnosis Date  . DVT (deep venous thrombosis) ([place])   . Varicose veins      SURGICAL HISTORY   Past Surgical History:  Procedure Laterality Date  . CHOLECYSTECTOMY  Feb. 2006   Gall Bladder  . THROMBECTOMY Right 01/13/12 and  01/21/12   Right iliac vein stent and Vein thrombosis     FAMILY HISTORY   Family History  Problem Relation Age of Onset  . Arthritis Mother   . Cancer Mother   . Heart disease Father     Heart Disease before age 32  . Cancer Father   . Hypertension Father   . Heart attack Father      SOCIAL HISTORY   Social  History  Substance Use Topics  . Smoking status: Never Smoker  . Smokeless tobacco: Never Used  . Alcohol use No     MEDICATIONS    Home Medication:  Current Outpatient Rx  . Order #: [id number] Class: Historical Med  . Order #: [id number] Class: Normal  . Order #: [id number] Class: Historical Med    Current Medication:  Current Outpatient Prescriptions:  .  acetaminophen (TYLENOL) 650 MG CR tablet, Take 650 mg by mouth every 8 (eight) hours as needed for pain., Disp: , Rfl:  .  meloxicam (MOBIC) 15 MG tablet, Take 1 tablet by mouth  daily with food, Disp: 90 tablet, Rfl: 0 .  NON FORMULARY, Support hose to the waist for dx of venous insufficiency and edema 15-20 mm Hg, Disp: , Rfl:     ALLERGIES   Penicillins and Sulfa antibiotics     REVIEW OF SYSTEMS   Review of Systems  Constitutional: Positive for malaise/fatigue. Negative for chills, diaphoresis, fever and weight loss.  HENT: Negative for congestion and hearing loss.   Eyes: Negative for blurred vision and double vision.  Respiratory: Negative for cough, hemoptysis, sputum production, shortness of breath and wheezing.   Cardiovascular: Negative for chest pain, palpitations and  orthopnea.  Gastrointestinal: Negative for abdominal pain, heartburn, nausea and vomiting.  Genitourinary: Negative for dysuria and urgency.  Musculoskeletal: Negative for back pain, myalgias and neck pain.  Skin: Negative for rash.  Neurological: Negative for dizziness, tingling, tremors, weakness and headaches.  Endo/Heme/Allergies: Does not bruise/bleed easily.  Psychiatric/Behavioral: Negative for depression, substance abuse and suicidal ideas.  All other systems reviewed and are negative.    VS: BP 132/88 (BP Location: Left Arm, Cuff Size: Normal)   Pulse 88   Ht 5\' 4"  (1.626 m)   Wt 224 lb (101.6 kg)   LMP 11/16/2015   SpO2 97%   BMI 38.45 kg/m       PHYSICAL EXAM  Physical Exam  Constitutional: She is oriented to person,  place, and time. She appears well-developed and well-nourished. No distress.  HENT:  Head: Normocephalic and atraumatic.  Mouth/Throat: No oropharyngeal exudate.  Eyes: EOM are normal. Pupils are equal, round, and reactive to light. No scleral icterus.  Neck: Normal range of motion. Neck supple.  Cardiovascular: Normal rate, regular rhythm and normal heart sounds.   No murmur heard. Pulmonary/Chest: No stridor. No respiratory distress. She has no wheezes. She has no rales.  Abdominal: Soft. Bowel sounds are normal.  Musculoskeletal: Normal range of motion. She exhibits no edema.  Neurological: She is alert and oriented to person, place, and time. No cranial nerve deficit.  Skin: Skin is warm. She is not diaphoretic.  Psychiatric: She has a normal mood and affect.          ASSESSMENT/PLAN   40 yo pleasant overweight female with signs and symptoms of sleep apnea with excessive daytime sleepiness and excess fatigue   1.recommend sleep study ASAP 2.recommmend diet modification,exercise and weight loss       The Patient requires high complexity decision making for assessment and support, frequent evaluation and titration of therapies, application of advanced monitoring technologies and extensive interpretation of multiple databases.   Patient  satisfied with Plan of action and management. All questions answered  Corrin Parker, M.D.  [facility]  Medical Director [place] Director [facility] Cardio-Pulmonary Department

## 2015-12-30 DIAGNOSIS — G4733 Obstructive sleep apnea (adult) (pediatric): Secondary | ICD-10-CM | POA: Diagnosis not present

## 2015-12-31 DIAGNOSIS — G4733 Obstructive sleep apnea (adult) (pediatric): Secondary | ICD-10-CM | POA: Diagnosis not present

## 2016-01-03 ENCOUNTER — Other Ambulatory Visit: Payer: Self-pay | Admitting: *Deleted

## 2016-01-03 DIAGNOSIS — G4719 Other hypersomnia: Secondary | ICD-10-CM

## 2016-01-14 ENCOUNTER — Encounter: Payer: Self-pay | Admitting: Family Medicine

## 2016-01-14 ENCOUNTER — Other Ambulatory Visit (HOSPITAL_COMMUNITY)
Admission: RE | Admit: 2016-01-14 | Discharge: 2016-01-14 | Disposition: A | Discharge status: Home / Self Care | Payer: Managed Care, Other (non HMO) | Source: Ambulatory Visit | Attending: Family Medicine | Admitting: Family Medicine

## 2016-01-14 ENCOUNTER — Ambulatory Visit (INDEPENDENT_AMBULATORY_CARE_PROVIDER_SITE_OTHER): Payer: Managed Care, Other (non HMO) | Admitting: Family Medicine

## 2016-01-14 VITALS — BP 118/68 | HR 68 | Temp 98.0°F | Ht 63.75 in | Wt 224.8 lb

## 2016-01-14 DIAGNOSIS — I80299 Phlebitis and thrombophlebitis of other deep vessels of unspecified lower extremity: Secondary | ICD-10-CM

## 2016-01-14 DIAGNOSIS — E6609 Other obesity due to excess calories: Secondary | ICD-10-CM | POA: Diagnosis not present

## 2016-01-14 DIAGNOSIS — Z01419 Encounter for gynecological examination (general) (routine) without abnormal findings: Secondary | ICD-10-CM

## 2016-01-14 DIAGNOSIS — Z23 Encounter for immunization: Secondary | ICD-10-CM | POA: Diagnosis not present

## 2016-01-14 DIAGNOSIS — Z6838 Body mass index (BMI) 38.0-38.9, adult: Secondary | ICD-10-CM

## 2016-01-14 DIAGNOSIS — I803 Phlebitis and thrombophlebitis of lower extremities, unspecified: Secondary | ICD-10-CM

## 2016-01-14 DIAGNOSIS — Z0000 Encounter for general adult medical examination without abnormal findings: Secondary | ICD-10-CM

## 2016-01-14 LAB — HM PAP SMEAR: HM Pap smear: NORMAL

## 2016-01-14 NOTE — Patient Instructions (Addendum)
Labs today  Pap result is pending  Take care of yourself  Work on healthy diet and exercise for fitness and weight loss  Flu shot today  You are due for a mammogram-call the breast center and schedule it

## 2016-01-14 NOTE — Progress Notes (Signed)
Pre visit review using our clinic review tool, if applicable. No additional management support is needed unless otherwise documented below in the visit note. 

## 2016-01-14 NOTE — Progress Notes (Signed)
Subjective:    Patient ID: Kari Rice, female    DOB: 1975-11-07, 40 y.o.   MRN: [id number]  HPI Here for health maintenance exam and to review chronic medical problems    Working a lot - doing well overall   Wt Readings from Last 3 Encounters:  01/14/16 224 lb 12 oz (101.9 kg)  12/10/15 224 lb (101.6 kg)  12/09/15 222 lb 8 oz (100.9 kg)  bmi is 38.8 Had her sleep test and waiting on results of that  Eating healthy  Hopes to start going to the gym    Flu shot- today  Pap 2/16-neg with neg HPV but pos for trich (she has had this chronically and seen ID-tx with flagyl)  Does not think she has symptoms of trich now - she does not have discharge   Tetanus shot 12/09  HIV screen neg 2/16  Due for labs  She is interested in vit D level -labs are free   Patient Active Problem List   Diagnosis Date Noted  . Hematoma 12/09/2015  . Somnolence, daytime 12/09/2015  . Avulsed toenail 09/16/2015  . Left hand pain 08/22/2014  . Finger pain, left 08/10/2014  . Routine general medical examination at a health care facility 05/29/2014  . Encounter for routine gynecological examination 05/29/2014  . Screening for HIV (human immunodeficiency virus) 05/29/2014  . Encounter for screening mammogram for breast cancer 05/29/2014  . Obesity 05/29/2014  . Pain of right thumb 04/10/2014  . Left knee pain 04/10/2014  . Joint pain 04/10/2014  . DVT (deep venous thrombosis) ([place]) 07/06/2012  . Varicose veins of lower extremities with other complications [id number]  . Right leg pain 05/09/2012  . 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 Acute venous embolism and thrombosis of deep vessels of proximal lower extremity ([place]) 01/13/2012  . Unspecified venous (peripheral) insufficiency 12/23/2011  . Venous insufficiency 11/25/2011  . Phlebitis and thrombophlebitis of the leg ([place]) 11/06/2011  . Venous insufficiency, peripheral  10/26/2011  . Pedal edema 10/26/2011  . Pain in joint, lower leg 06/04/2010  . NECK PAIN 10/21/2009  . IRREGULAR MENSES 02/29/2008  . UNSPECIFIED VAGINITIS AND VULVOVAGINITIS 01/03/2008  . 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 DVT (deep venous thrombosis) ([place])   . Varicose veins    Past Surgical History:  Procedure Laterality Date  . CHOLECYSTECTOMY  Feb. 2006   Gall Bladder  . THROMBECTOMY Right 01/13/12 and  01/21/12   Right iliac vein stent and Vein thrombosis   Social History  Substance Use Topics  . Smoking status: Never Smoker  . Smokeless tobacco: Never Used  . Alcohol use No   Family History  Problem Relation Age of Onset  . Arthritis Mother   . Cancer Mother   . Heart disease Father     Heart Disease before age 26  . Cancer Father   . Hypertension Father   . Heart attack Father    Allergies  Allergen Reactions  . Penicillins Rash  . Sulfa Antibiotics Rash   Current Outpatient Prescriptions on File Prior to Visit  Medication Sig Dispense Refill  . acetaminophen (TYLENOL) 650 MG CR tablet Take 650 mg by mouth every 8 (eight) hours as needed for pain.    . meloxicam (MOBIC) 15 MG tablet Take 1 tablet by mouth  daily with food 90 tablet 0  . NON FORMULARY Support hose to the waist for  dx of venous insufficiency and edema 15-20 mm Hg     No current facility-administered medications on file prior to visit.      Review of Systems    Review of Systems  Constitutional: Negative for fever, appetite change, fatigue and unexpected weight change.  Eyes: Negative for pain and visual disturbance.  Respiratory: Negative for cough and shortness of breath.   Cardiovascular: Negative for cp or palpitations   pos for varicose veins  Gastrointestinal: Negative for nausea, diarrhea and constipation.  Genitourinary: Negative for urgency and frequency. neg for vaginal d/c Skin: Negative for pallor or rash   Neurological: Negative for  weakness, light-headedness, numbness and headaches.  Hematological: Negative for adenopathy. Does not bruise/bleed easily.  Psychiatric/Behavioral: Negative for dysphoric mood. The patient is not nervous/anxious.      Objective:   Physical Exam  Constitutional: She appears well-developed and well-nourished. No distress.  obese and well appearing   HENT:  Head: Normocephalic and atraumatic.  Right Ear: External ear normal.  Left Ear: External ear normal.  Mouth/Throat: Oropharynx is clear and moist.  Eyes: Conjunctivae and EOM are normal. Pupils are equal, round, and reactive to light. No scleral icterus.  Neck: Normal range of motion. Neck supple. No JVD present. Carotid bruit is not present. No thyromegaly present.  Cardiovascular: Normal rate, regular rhythm, normal heart sounds and intact distal pulses.  Exam reveals no gallop.   bilat LE varicosities  Pulmonary/Chest: Effort normal and breath sounds normal. No respiratory distress. She has no wheezes. She exhibits no tenderness.  Abdominal: Soft. Bowel sounds are normal. She exhibits no distension, no abdominal bruit and no mass. There is no tenderness.  Genitourinary: No breast swelling, tenderness, discharge or bleeding.  Genitourinary Comments: Breast exam: No mass, nodules, thickening, tenderness, bulging, retraction, inflamation, nipple discharge or skin changes noted.  No axillary or clavicular LA.             Anus appears normal w/o hemorrhoids or masses     External genitalia : nl appearance and hair distribution/no lesions     Urethral meatus : nl size, no lesions or prolapse     Urethra: no masses, tenderness or scarring    Bladder : no masses or tenderness     Vagina: nl general appearance, no Lesions, no significant cystocele  or rectocele  Some mild thin white vaginal d/c    Cervix: no lesions/ discharge or friability    Uterus: nl size, contour, position, and mobility (not fixed) , non tender     Adnexa : no masses, tenderness, enlargement or nodularity        Musculoskeletal: Normal range of motion. She exhibits no edema or tenderness.  Lymphadenopathy:    She has no cervical adenopathy.  Neurological: She is alert. She has normal reflexes. No cranial nerve deficit. She exhibits normal muscle tone. Coordination normal.  Skin: Skin is warm and dry. No rash noted. No erythema. No pallor.  Fair with lentigines and some stable brown nevi  Psychiatric: She has a normal mood and affect.          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  Will see if this shows up on pap  No c/o      Relevant Orders   Cytology - PAP   Obesity    Discussed how this problem influences overall health and the risks it imposes  Reviewed plan for weight loss with lower calorie diet (via better food choices and also portion control or program like weight watchers) and exercise building up to or more than 30 minutes 5 days per week including some aerobic activity         Routine general medical examination at a health care facility - Primary    Reviewed health habits including diet and exercise and skin cancer prevention Reviewed appropriate screening tests for age  Also reviewed health mt list, fam hx and immunization status , as well as social and family history    See HPI Labs ordered  Labs today  Pap result is pending  Take care of yourself  Work on healthy diet and exercise for fitness and weight loss  Flu shot today  You are due for a mammogram-call the breast center and schedule it      Relevant Orders   CBC with Differential/Platelet (Completed)   Comprehensive  metabolic panel (Completed)   TSH (Completed)   Lipid panel (Completed)   VITAMIN D 25 Hydroxy (Vit-D Deficiency, Fractures) (Completed)    Other Visit Diagnoses    Need for influenza vaccination       Relevant Orders   Flu Vaccine QUAD 36+ mos IM (Completed)

## 2016-01-15 ENCOUNTER — Encounter: Payer: Self-pay | Admitting: Family Medicine

## 2016-01-15 ENCOUNTER — Telehealth: Payer: Self-pay | Admitting: *Deleted

## 2016-01-15 DIAGNOSIS — G4733 Obstructive sleep apnea (adult) (pediatric): Secondary | ICD-10-CM

## 2016-01-15 DIAGNOSIS — E559 Vitamin D deficiency, unspecified: Secondary | ICD-10-CM | POA: Insufficient documentation

## 2016-01-15 LAB — LIPID PANEL
CHOL/HDL RATIO: 3.1 ratio (ref 0.0–4.4)
CHOLESTEROL TOTAL: 178 mg/dL (ref 100–199)
HDL: 58 mg/dL (ref >39)
LDL CALC: 99 mg/dL (ref 0–99)
Triglycerides: 104 mg/dL (ref 0–149)
VLDL Cholesterol Cal: 21 mg/dL (ref 5–40)

## 2016-01-15 LAB — CBC WITH DIFFERENTIAL/PLATELET
Basophils Absolute: 0 10*3/uL (ref 0.0–0.2)
Basos: 0 %
EOS (ABSOLUTE): 0.6 10*3/uL — AB (ref 0.0–0.4)
EOS: 6 %
Hematocrit: 39.5 % (ref 34.0–46.6)
Hemoglobin: 13 g/dL (ref 11.1–15.9)
Immature Grans (Abs): 0 10*3/uL (ref 0.0–0.1)
Immature Granulocytes: 0 %
LYMPHS ABS: 3.9 10*3/uL — AB (ref 0.7–3.1)
Lymphs: 38 %
MCH: 30.3 pg (ref 26.6–33.0)
MCHC: 32.9 g/dL (ref 31.5–35.7)
MCV: 92 fL (ref 79–97)
MONOS ABS: 0.8 10*3/uL (ref 0.1–0.9)
Monocytes: 8 %
NEUTROS ABS: 5 10*3/uL (ref 1.4–7.0)
Neutrophils: 48 %
PLATELETS: 263 10*3/uL (ref 150–379)
RBC: 4.29 x10E6/uL (ref 3.77–5.28)
RDW: 13.1 % (ref 12.3–15.4)
WBC: 10.4 10*3/uL (ref 3.4–10.8)

## 2016-01-15 LAB — COMPREHENSIVE METABOLIC PANEL
A/G RATIO: 1.6 (ref 1.2–2.2)
ALT: 14 IU/L (ref 0–32)
AST: 14 IU/L (ref 0–40)
Albumin: 4.2 g/dL (ref 3.5–5.5)
Alkaline Phosphatase: 76 IU/L (ref 39–117)
BUN/Creatinine Ratio: 26 — ABNORMAL HIGH (ref 9–23)
BUN: 17 mg/dL (ref 6–24)
Bilirubin Total: 0.3 mg/dL (ref 0.0–1.2)
CHLORIDE: 103 mmol/L (ref 96–106)
CO2: 25 mmol/L (ref 18–29)
Calcium: 9.2 mg/dL (ref 8.7–10.2)
Creatinine, Ser: 0.65 mg/dL (ref 0.57–1.00)
GFR calc non Af Amer: 110 mL/min/{1.73_m2} (ref >59)
GFR, EST AFRICAN AMERICAN: 127 mL/min/{1.73_m2} (ref >59)
Globulin, Total: 2.6 g/dL (ref 1.5–4.5)
Glucose: 96 mg/dL (ref 65–99)
POTASSIUM: 3.7 mmol/L (ref 3.5–5.2)
Sodium: 144 mmol/L (ref 134–144)
Total Protein: 6.8 g/dL (ref 6.0–8.5)

## 2016-01-15 LAB — TSH: TSH: 1.69 u[IU]/mL (ref 0.450–4.500)

## 2016-01-15 LAB — VITAMIN D 25 HYDROXY (VIT D DEFICIENCY, FRACTURES): Vit D, 25-Hydroxy: 14.8 ng/mL — ABNORMAL LOW (ref 30.0–100.0)

## 2016-01-15 NOTE — Assessment & Plan Note (Signed)
Exam and pap done  Hx of chronic trichomonas -has seen ID for this  Will see if this shows up on pap  No c/o

## 2016-01-15 NOTE — Telephone Encounter (Signed)
Pt informed of sleep study results. Will placed order for CPAP machine. Nothing further needed.

## 2016-01-15 NOTE — Telephone Encounter (Signed)
LMOM for pt to call back for sleep study results.

## 2016-01-15 NOTE — Assessment & Plan Note (Signed)
Reviewed health habits including diet and exercise and skin cancer prevention Reviewed appropriate screening tests for age  Also reviewed health mt list, fam hx and immunization status , as well as social and family history    See HPI Labs ordered  Labs today  Pap result is pending  Take care of yourself  Work on healthy diet and exercise for fitness and weight loss  Flu shot today  You are due for a mammogram-call the breast center and schedule it

## 2016-01-15 NOTE — Assessment & Plan Note (Signed)
Discussed how this problem influences overall health and the risks it imposes  Reviewed plan for weight loss with lower calorie diet (via better food choices and also portion control or program like weight watchers) and exercise building up to or more than 30 minutes 5 days per week including some aerobic activity    

## 2016-01-15 NOTE — Assessment & Plan Note (Signed)
Intermittent and chronic  Recommend support hose which she wears

## 2016-01-16 ENCOUNTER — Telehealth: Payer: Self-pay | Admitting: Family Medicine

## 2016-01-16 ENCOUNTER — Telehealth: Payer: Self-pay | Admitting: *Deleted

## 2016-01-16 LAB — CYTOLOGY - PAP

## 2016-01-16 MED ORDER — VITAMIN D (ERGOCALCIFEROL) 1.25 MG (50000 UNIT) PO CAPS: 50000.0000 [IU] | ORAL_CAPSULE | ORAL | 0 refills | Status: DC

## 2016-01-16 MED ORDER — TINIDAZOLE 500 MG PO TABS: 2.0000 g | ORAL_TABLET | Freq: Every day | ORAL | 0 refills | Status: DC

## 2016-01-16 NOTE — Telephone Encounter (Signed)
Pap is normal but she has trichomonas again (this has been chronic) I looked back at notes from Dr Clayborn Bigness from 09- and he last used tinazadole 2 g daily - I will send that in to her pharmacy  Let's see how this works better

## 2016-01-16 NOTE — Telephone Encounter (Signed)
-----   Message from Abner Greenspan, MD sent at 01/15/2016  6:57 AM EDT ----- Labs look ok but vitamin D level is quite low Please call in ergocalciferol 50,000 iu 1 po q week #12 no ref-take for 12 weeks Also take 2000 iu daily otc of D3  Re check D level in 3-4 months please

## 2016-01-16 NOTE — Telephone Encounter (Signed)
Pt notified of lab results and Dr. Marliss Coots comments. Rx sent to pharmacy and pt will get OTC D3, and lab appt scheduled

## 2016-01-17 NOTE — Telephone Encounter (Signed)
This Probation officer spoke with patient and informed of Pap results and R/X that has been sent in to the Pharmacy.  Patient verbalizes understanding and also asks for clarification on Vitamin D3 orders which I reviewed with her from message on 01/16/2016.

## 2016-01-17 NOTE — Telephone Encounter (Signed)
Chart updated and called pt but no answer so left voicemail requesting pt to call the office back

## 2016-01-21 ENCOUNTER — Other Ambulatory Visit: Payer: Self-pay

## 2016-01-21 MED ORDER — MELOXICAM 15 MG PO TABS: 15.0000 mg | ORAL_TABLET | Freq: Every day | ORAL | 1 refills | Status: DC

## 2016-01-21 NOTE — Telephone Encounter (Signed)
Rx sent electronically.  

## 2016-03-15 ENCOUNTER — Encounter: Payer: Self-pay | Admitting: Internal Medicine

## 2016-03-23 ENCOUNTER — Ambulatory Visit: Payer: Managed Care, Other (non HMO) | Admitting: Internal Medicine

## 2016-03-24 ENCOUNTER — Ambulatory Visit (INDEPENDENT_AMBULATORY_CARE_PROVIDER_SITE_OTHER): Payer: Managed Care, Other (non HMO) | Admitting: Internal Medicine

## 2016-03-24 ENCOUNTER — Encounter: Payer: Self-pay | Admitting: Internal Medicine

## 2016-03-24 VITALS — BP 134/80 | HR 101 | Ht 64.0 in | Wt 223.0 lb

## 2016-03-24 DIAGNOSIS — G4733 Obstructive sleep apnea (adult) (pediatric): Secondary | ICD-10-CM | POA: Diagnosis not present

## 2016-03-24 DIAGNOSIS — Z9989 Dependence on other enabling machines and devices: Secondary | ICD-10-CM | POA: Diagnosis not present

## 2016-03-24 NOTE — Patient Instructions (Signed)
Continue nasal CPAP therapy continue Vit D therapy Saline eye drops as needed

## 2016-03-24 NOTE — Progress Notes (Signed)
[facility] Consultation      Date: 03/24/2016,   MRN# [id number] Kari Rice [birth year] Code Status:  Code Status History    This patient does not have a recorded code status. Please follow your organizational policy for patients in this situation.     Hosp day:@LENGTHOFSTAYDAYS @ Referring MD: @ATDPROV @     PCP:      AdmissionWeight: 223 lb (101.2 kg)                 CurrentWeight: 223 lb (101.2 kg) Kari Rice is a 40 y.o. old female seen in consultation for problems with sleeping at the request of DR Tower     CHIEF COMPLAINT:  Follow up with Dx of OSA    HISTORY OF PRESENT ILLNESS  40 yo pleasant white female seen today for problems sleeping for over 1 year Sleep study confirms OSA AHI 12.5/hr She has no issues except dry eyes  Her compliance report shows 57% compliance >4hrs With AHI 0.8  No signs of infection at this time    She has no acute resp issues or cardiac issues at this time  She works as Corporate investment banker at [employer] She is single, nonsmoker, exposed to second hand smoke Previous DVT in 2013 unknown reason Father with COPD on oxygen thetrapy        Current Medication:  Current Outpatient Prescriptions:  .  acetaminophen (TYLENOL) 650 MG CR tablet, Take 650 mg by mouth every 8 (eight) hours as needed for pain., Disp: , Rfl:  .  Cholecalciferol (VITAMIN D3) 2000 units TABS, Take 1 tablet by mouth daily., Disp: , Rfl:  .  meloxicam (MOBIC) 15 MG tablet, Take 1 tablet (15 mg total) by mouth daily. with food, Disp: 90 tablet, Rfl: 1 .  NON FORMULARY, Support hose to the waist for dx of venous insufficiency and edema 15-20 mm Hg, Disp: , Rfl:  .  Vitamin D, Ergocalciferol, (DRISDOL) 50000 units CAPS capsule, Take 1 capsule (50,000 Units total) by mouth every 7 (seven) days., Disp: 12 capsule, Rfl: 0    ALLERGIES   Penicillins and Sulfa antibiotics     REVIEW OF SYSTEMS   Review of Systems  Constitutional:  Negative for chills, diaphoresis, fever, malaise/fatigue and weight loss.  HENT: Negative for congestion and hearing loss.   Eyes: Negative for blurred vision and double vision.  Respiratory: Negative for cough, hemoptysis, sputum production, shortness of breath and wheezing.   Cardiovascular: Negative for chest pain, palpitations and orthopnea.  Gastrointestinal: Negative for abdominal pain, heartburn, nausea and vomiting.  Skin: Negative for rash.  Neurological: Negative for weakness.  All other systems reviewed and are negative.    VS: BP 134/80 (BP Location: Left Arm, Cuff Size: Normal)   Pulse (!) 101   Ht 5\' 4"  (1.626 m)   Wt 223 lb (101.2 kg)   SpO2 100%   BMI 38.28 kg/m      PHYSICAL EXAM  Physical Exam  Constitutional: She is oriented to person, place, and time. She appears well-developed and well-nourished. No distress.  HENT:  Mouth/Throat: No oropharyngeal exudate.  Eyes: Pupils are equal, round, and reactive to light.  Neck: Neck supple.  Cardiovascular: Normal rate, regular rhythm and normal heart sounds.   No murmur heard. Pulmonary/Chest: Effort normal and breath sounds normal. No stridor. No respiratory distress. She has no wheezes. She has no rales.  Musculoskeletal: Normal range of motion. She exhibits no edema.  Neurological: She is alert and  oriented to person, place, and time. No cranial nerve deficit.  Skin: Skin is warm. She is not diaphoretic.  Psychiatric: She has a normal mood and affect.          ASSESSMENT/PLAN   40 yo pleasant overweight female with dx of Mild -Moderate Sleep apnea with AHI 12.5 Overall, doing well with therapy, fatigue has improved  1.continue CPAP therapy as prescribed 2.recommmend diet modification,exercise and weight loss 3.saline eye drops as needed 4.cont Vit D therapy    Follow up in 6 months    The Patient requires high complexity decision making for assessment and support, frequent evaluation and  titration of therapies, application of advanced monitoring technologies and extensive interpretation of multiple databases.   Patient  satisfied with Plan of action and management. All questions answered  Corrin Parker, M.D.  [facility]  Medical Director [place] Director [facility] Cardio-Pulmonary Department

## 2016-03-25 ENCOUNTER — Ambulatory Visit (INDEPENDENT_AMBULATORY_CARE_PROVIDER_SITE_OTHER): Payer: Managed Care, Other (non HMO) | Admitting: Family Medicine

## 2016-03-25 ENCOUNTER — Encounter: Payer: Self-pay | Admitting: Family Medicine

## 2016-03-25 VITALS — BP 124/74 | HR 89 | Temp 98.3°F | Ht 63.75 in | Wt 222.2 lb

## 2016-03-25 DIAGNOSIS — L659 Nonscarring hair loss, unspecified: Secondary | ICD-10-CM | POA: Insufficient documentation

## 2016-03-25 NOTE — Progress Notes (Signed)
Pre visit review using our clinic review tool, if applicable. No additional management support is needed unless otherwise documented below in the visit note. 

## 2016-03-25 NOTE — Progress Notes (Signed)
Subjective:    Patient ID: Kari Rice, female    DOB: 05-Apr-1976, 40 y.o.   MRN: [id number]  HPI Here with c/o of hair loss   Had her hair done on Friday and her hairdresser noted she had a lot more hair fall out noted Pt has noticed more hair loss also -more in the sink and the tub   Had recently changed to Pantene shampoo-  ? If that made a difference   No discrete bald area   No female pattern hair loss in family that she knows of   Mother had thyroid problems   Lab Results  Component Value Date   TSH 1.690 01/14/2016    Takes vitamin D Eats a fairly balanced diet -perhaps more fast food and plans with new schedule to eat better (packing lunch) and going to the gym on the way home     Wt Readings from Last 3 Encounters:  03/25/16 222 lb 4 oz (100.8 kg)  03/24/16 223 lb (101.2 kg)  01/14/16 224 lb 12 oz (101.9 kg)   No big weight changes  Some stress- job change   Hormone status  Periods - changed to a shorter cycle  Lasts 7 days  2 are heavy (larger pad)  Not a lot of cramping  No chance pregnant  Not sexually active   Doing better with sleep apnea   Patient Active Problem List   Diagnosis Date Noted  . Hair loss 03/25/2016  . Vitamin D deficiency 01/15/2016  . Somnolence, daytime 12/09/2015  . Avulsed toenail 09/16/2015  . Routine general medical examination at a health care facility 05/29/2014  . Encounter for routine gynecological examination 05/29/2014  . Screening for HIV (human immunodeficiency virus) 05/29/2014  . Encounter for screening mammogram for breast cancer 05/29/2014  . Obesity 05/29/2014  . Joint pain 04/10/2014  . History of DVT (deep vein thrombosis) 07/06/2012  . Varicose veins of lower extremities with other complications [id number]  . Unspecified venous (peripheral) insufficiency 12/23/2011  . Venous insufficiency 11/25/2011  . Phlebitis and thrombophlebitis of the leg ([place]) 11/06/2011  . Venous insufficiency, peripheral  10/26/2011  . Pedal edema 10/26/2011  . IRREGULAR MENSES 02/29/2008  . UNSPECIFIED VAGINITIS AND VULVOVAGINITIS 01/03/2008  . 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 DVT (deep venous thrombosis) ([place])   . Varicose veins    Past Surgical History:  Procedure Laterality Date  . CHOLECYSTECTOMY  Feb. 2006   Gall Bladder  . THROMBECTOMY Right 01/13/12 and  01/21/12   Right iliac vein stent and Vein thrombosis   Social History  Substance Use Topics  . Smoking status: Never Smoker  . Smokeless tobacco: Never Used  . Alcohol use No   Family History  Problem Relation Age of Onset  . Arthritis Mother   . Cancer Mother   . Heart disease Father     Heart Disease before age 90  . Cancer Father   . Hypertension Father   . Heart attack Father    Allergies  Allergen Reactions  . Penicillins Rash  . Sulfa Antibiotics Rash   Current Outpatient Prescriptions on File Prior to Visit  Medication Sig Dispense Refill  . acetaminophen (TYLENOL) 650 MG CR tablet Take 650 mg by mouth every 8 (eight) hours as needed for pain.    . Cholecalciferol (VITAMIN D3) 2000 units TABS Take 1 tablet by mouth daily.    . NON FORMULARY Support hose to the waist for  dx of venous insufficiency and edema 15-20 mm Hg    . Vitamin D, Ergocalciferol, (DRISDOL) 50000 units CAPS capsule Take 1 capsule (50,000 Units total) by mouth every 7 (seven) days. 12 capsule 0   No current facility-administered medications on file prior to visit.     Review of Systems    Review of Systems  Constitutional: Negative for fever, appetite change, fatigue and unexpected weight change.  Eyes: Negative for pain and visual disturbance.  Respiratory: Negative for cough and shortness of breath.   Cardiovascular: Negative for cp or palpitations    Gastrointestinal: Negative for nausea, diarrhea and constipation.  Genitourinary: Negative for urgency and frequency.  Skin: Negative for pallor or  rash  pos for excessive diffuse hair loss w/o alopecia  Neurological: Negative for weakness, light-headedness, numbness and headaches.  Hematological: Negative for adenopathy. Does not bruise/bleed easily.  Psychiatric/Behavioral: Negative for dysphoric mood. The patient is not nervous/anxious.      Objective:   Physical Exam  Constitutional: She appears well-developed and well-nourished. No distress.  HENT:  Head: Normocephalic and atraumatic.  Mouth/Throat: Oropharynx is clear and moist.  Eyes: Conjunctivae and EOM are normal. Pupils are equal, round, and reactive to light. No scleral icterus.  Neck: Normal range of motion. Neck supple. No thyromegaly present.  Cardiovascular: Normal rate and regular rhythm.   Musculoskeletal: She exhibits no edema.  Neurological: She is alert.  Skin: Skin is warm and dry.  No focal alopecia  Healthy appearing scalp Hair is colored No hair breakage            Assessment & Plan:   Problem List Items Addressed This Visit      Other   Hair loss    Pt has noticed diffusely-no bald spots Nl exam  Disc many factors - hormone/schedule/sleep/diet  Will watch  Rev last labs May try biotin over the counter  Keep up good habits Do not traumatize hair or scalp (does use color)

## 2016-03-25 NOTE — Assessment & Plan Note (Signed)
Pt has noticed diffusely-no bald spots Nl exam  Disc many factors - hormone/schedule/sleep/diet  Will watch  Rev last labs May try biotin over the counter  Keep up good habits Do not traumatize hair or scalp (does use color)

## 2016-03-25 NOTE — Patient Instructions (Signed)
When you finish the 50,000 iu vit D - stop it (after 12 weeks) Keep taking 2000 iu daily  Eat a balanced diet with less junk food  Get regular exercise and sleep   Hair loss can come from hormone change and weight and sleep changes  You may notice more hair loss because it is longer  Watch for breakage- be gentle to hair  No tight clips or pony tails   Try biotin over the counter as directions  If no improvement after 3 months stop it  We will continue to watch it

## 2016-05-15 ENCOUNTER — Ambulatory Visit (INDEPENDENT_AMBULATORY_CARE_PROVIDER_SITE_OTHER): Payer: Managed Care, Other (non HMO) | Admitting: Family Medicine

## 2016-05-15 ENCOUNTER — Encounter: Payer: Self-pay | Admitting: Family Medicine

## 2016-05-15 ENCOUNTER — Other Ambulatory Visit: Payer: Self-pay | Admitting: Family Medicine

## 2016-05-15 VITALS — BP 106/70 | HR 70 | Temp 98.2°F | Ht 63.75 in | Wt 223.5 lb

## 2016-05-15 DIAGNOSIS — I80299 Phlebitis and thrombophlebitis of other deep vessels of unspecified lower extremity: Secondary | ICD-10-CM

## 2016-05-15 DIAGNOSIS — E559 Vitamin D deficiency, unspecified: Secondary | ICD-10-CM

## 2016-05-15 DIAGNOSIS — I803 Phlebitis and thrombophlebitis of lower extremities, unspecified: Secondary | ICD-10-CM

## 2016-05-15 DIAGNOSIS — R0981 Nasal congestion: Secondary | ICD-10-CM

## 2016-05-15 NOTE — Telephone Encounter (Signed)
Labs are not back yet-will determine based on labs so do not approve it for now  Thanks

## 2016-05-15 NOTE — Telephone Encounter (Signed)
Pt was seen today, she ? If she needed a refill or is she done (she completed the 12 weeks)

## 2016-05-15 NOTE — Progress Notes (Signed)
Subjective:    Patient ID: Kari Rice, female    DOB: 1975-10-23, 41 y.o.   MRN: [id number]  HPI Here for uri symptoms    Using a zicam spray for congestion -which helps but then she gets congested again  Going on for a month  Her congestion was making it hard to use her cpap   All clear nasal d/c  No facial pain   Wants to check her vit D today  Has been taking her D 2000 iu daily , and 50,000 weekly (last dose yesterday)   Patient Active Problem List   Diagnosis Date Noted  . Nasal congestion 05/15/2016  . Hair loss 03/25/2016  . Vitamin D deficiency 01/15/2016  . Somnolence, daytime 12/09/2015  . Avulsed toenail 09/16/2015  . Routine general medical examination at a health care facility 05/29/2014  . Encounter for routine gynecological examination 05/29/2014  . Screening for HIV (human immunodeficiency virus) 05/29/2014  . Encounter for screening mammogram for breast cancer 05/29/2014  . Obesity 05/29/2014  . Joint pain 04/10/2014  . History of DVT (deep vein thrombosis) 07/06/2012  . Varicose veins of lower extremities with other complications [id number]  . Unspecified venous (peripheral) insufficiency 12/23/2011  . Venous insufficiency 11/25/2011  . Phlebitis and thrombophlebitis of the leg ([place]) 11/06/2011  . Venous insufficiency, peripheral 10/26/2011  . Pedal edema 10/26/2011  . IRREGULAR MENSES 02/29/2008  . UNSPECIFIED VAGINITIS AND VULVOVAGINITIS 01/03/2008  . 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 DVT (deep venous thrombosis) ([place])   . Varicose veins    Past Surgical History:  Procedure Laterality Date  . CHOLECYSTECTOMY  Feb. 2006   Gall Bladder  . THROMBECTOMY Right 01/13/12 and  01/21/12   Right iliac vein stent and Vein thrombosis   Social History  Substance Use Topics  . Smoking status: Never Smoker  . Smokeless tobacco: Never Used  . Alcohol use No   Family History  Problem Relation Age of Onset    . Arthritis Mother   . Cancer Mother   . Heart disease Father     Heart Disease before age 24  . Cancer Father   . Hypertension Father   . Heart attack Father    Allergies  Allergen Reactions  . Penicillins Rash  . Sulfa Antibiotics Rash   Current Outpatient Prescriptions on File Prior to Visit  Medication Sig Dispense Refill  . Cholecalciferol (VITAMIN D3) 2000 units TABS Take 1 tablet by mouth daily.    . NON FORMULARY Support hose to the waist for dx of venous insufficiency and edema 15-20 mm Hg     No current facility-administered medications on file prior to visit.     Review of Systems Review of Systems  Constitutional: Negative for fever, appetite change, fatigue and unexpected weight change.  Eyes: Negative for pain and visual disturbance.  ENT pos for mod to severe nasal congestion/ neg for sinus /ear or throat pain Respiratory: Negative for cough and shortness of breath.   Cardiovascular: Negative for cp or palpitations    Gastrointestinal: Negative for nausea, diarrhea and constipation.  Genitourinary: Negative for urgency and frequency.  Skin: Negative for pallor or rash   MSK neg for muscle cramping  Neurological: Negative for weakness, light-headedness, numbness and headaches.  Hematological: Negative for adenopathy. Does not bruise/bleed easily.  Psychiatric/Behavioral: Negative for dysphoric mood. The patient is not nervous/anxious.         Objective:   Physical Exam  Constitutional: She appears well-developed and well-nourished. No distress.  Well appearing/ overwt  HENT:  Head: Normocephalic and atraumatic.  Right Ear: External ear normal.  Left Ear: External ear normal.  Mouth/Throat: Oropharynx is clear and moist.  Nares are injected and congested  Clear rhinorrhea  Throat clear No sinus tenderness  Eyes: Conjunctivae and EOM are normal. Pupils are equal, round, and reactive to light. Right eye exhibits no discharge. Left eye exhibits no  discharge.  Neck: Normal range of motion. Neck supple.  Cardiovascular: Normal rate, regular rhythm and normal heart sounds.   Baseline venous insuff with supp hose  Pulmonary/Chest: Effort normal and breath sounds normal. No respiratory distress. She has no wheezes. She has no rales.  Musculoskeletal:  No kyphosis or acute joint changes  Lymphadenopathy:    She has no cervical adenopathy.  Neurological: She is alert. No cranial nerve deficit.  Skin: Skin is warm and dry. No rash noted. No pallor.  Psychiatric: She has a normal mood and affect.          Assessment & Plan:   Problem List Items Addressed This Visit      Cardiovascular and Mediastinum   Phlebitis and thrombophlebitis of the leg ([place])    Wears supp hose No symptoms lately        Other   Nasal congestion - Primary    Suspect rebound from decongestant nasal spray  Will stop this  can try steroid ns otc for 2 wk and saline  Avoid allergens  Update if not starting to improve in a week or if worsening    If s/s of sinusitis will alert [place]       Vitamin D deficiency    Level today after course of high dose weekly and still on 2000 iu daily      Relevant Orders   VITAMIN D 25 Hydroxy (Vit-D Deficiency, Fractures) (Completed)

## 2016-05-15 NOTE — Assessment & Plan Note (Addendum)
Suspect rebound from decongestant nasal spray  Will stop this  can try steroid ns otc for 2 wk and saline  Avoid allergens  Update if not starting to improve in a week or if worsening    If s/s of sinusitis will alert [place]

## 2016-05-15 NOTE — Assessment & Plan Note (Signed)
Level today after course of high dose weekly and still on 2000 iu daily

## 2016-05-15 NOTE — Patient Instructions (Addendum)
Stop the zicam decongestant nasal spray  Get either flonase or nasacort aq and use regularly for 2 weeks Saline nasal spray any time  You will congested for a while Watch for facial pain or fever  Update if not starting to improve in a week or if worsening    Vit D level today

## 2016-05-15 NOTE — Progress Notes (Signed)
Pre visit review using our clinic review tool, if applicable. No additional management support is needed unless otherwise documented below in the visit note. 

## 2016-05-16 LAB — VITAMIN D 25 HYDROXY (VIT D DEFICIENCY, FRACTURES): Vit D, 25-Hydroxy: 39.3 ng/mL (ref 30.0–100.0)

## 2016-05-17 NOTE — Assessment & Plan Note (Signed)
Wears supp hose No symptoms lately

## 2016-06-01 ENCOUNTER — Other Ambulatory Visit: Payer: Managed Care, Other (non HMO)

## 2017-02-09 ENCOUNTER — Encounter: Payer: Self-pay | Admitting: Family Medicine

## 2017-02-09 ENCOUNTER — Ambulatory Visit (INDEPENDENT_AMBULATORY_CARE_PROVIDER_SITE_OTHER): Payer: Managed Care, Other (non HMO) | Admitting: Family Medicine

## 2017-02-09 DIAGNOSIS — J069 Acute upper respiratory infection, unspecified: Secondary | ICD-10-CM

## 2017-02-09 MED ORDER — TRIAMCINOLONE ACETONIDE 55 MCG/ACT NA AERO: 2.0000 | INHALATION_SPRAY | Freq: Every day | NASAL | Status: DC

## 2017-02-09 MED ORDER — BENZONATATE 200 MG PO CAPS: 200.0000 mg | ORAL_CAPSULE | Freq: Three times a day (TID) | ORAL | 0 refills | Status: DC | PRN

## 2017-02-09 NOTE — Progress Notes (Signed)
H/o OSA and on CPAP.   Compliant.    Sx started about 2 days ago.  She works third shift, sx noted at work.  ST, burning in the throat.  Sinus pressure.  L ear pressure.  No fevers.  No vomiting.  Some diarrhea, no rash.  Minimal cough.  Some sneezing.    Meds, vitals, and allergies reviewed.   ROS: Per HPI unless specifically indicated in ROS section   GEN: nad, alert and oriented HEENT: mucous membranes moist, tm w/o erythema, nasal exam w/o erythema, clear discharge noted,  OP with cobblestoning NECK: supple w/o LA CV: rrr.   PULM: ctab, no inc wob EXT: no edema SKIN: no acute rash

## 2017-02-09 NOTE — Patient Instructions (Signed)
Drink plenty of fluids, take tylenol as needed, and gargle with warm salt water for your throat.  Use nasacort and gently try to pop your ears several times a day.  This should gradually improve.  Take care.  Let us know if you have other concerns.   Out of work in the meantime.

## 2017-02-10 DIAGNOSIS — J069 Acute upper respiratory infection, unspecified: Secondary | ICD-10-CM | POA: Insufficient documentation

## 2017-02-10 NOTE — Assessment & Plan Note (Addendum)
Likely viral, nontoxic.  Okay for outpatient f/u.  Supportive care.  See AVS.  She agrees.   Update [place] as needed.

## 2017-02-17 ENCOUNTER — Encounter: Payer: Self-pay | Admitting: Family Medicine

## 2017-02-17 ENCOUNTER — Ambulatory Visit: Payer: Managed Care, Other (non HMO) | Admitting: Family Medicine

## 2017-02-17 VITALS — BP 118/90 | HR 82 | Temp 98.4°F | Ht 69.5 in | Wt 234.0 lb

## 2017-02-17 DIAGNOSIS — J0100 Acute maxillary sinusitis, unspecified: Secondary | ICD-10-CM | POA: Diagnosis not present

## 2017-02-17 MED ORDER — AZITHROMYCIN 250 MG PO TABS: ORAL_TABLET | ORAL | 0 refills | Status: DC

## 2017-02-17 NOTE — Progress Notes (Signed)
Dr. Frederico Hamman T. Arin Vanosdol, MD, [facility] [place], [address] Phone: [phone] Fax: [phone]  02/17/2017  Patient: Kari Rice, MRN: [id number], DOB: 10/11/1975, 41 y.o.  Primary Physician:  Tower, Wynelle Fanny, MD   Chief Complaint  Patient presents with  . Cough    seen by Dr. Damita Dunnings 02/09/17 for URI  . Ear Pain  . Nasal Congestion   Subjective:   This 41 y.o. female patient presents with runny nose, sneezing, cough, sore throat, malaise and minimal / low-grade fever for > 1 week. Now the primary complaint has become sinus pressure and pain behind the eyes and in the upper, anterior face.   F/u URI seen by Dr. Damita Dunnings.  Was out of work last week, and now still flaring up now - pain in both ears. On a CPAP at night. On cold meds. Coughing so much - had some urinary incontinence.   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   The patent denies sore throat as the primary complaint. Denies sthortness of breath/wheezing, high fever, chest pain, significant myalgia, otalgia, abdominal pain, changes in bowel or bladder.  PMH, PHS, Allergies, Problem List, Medications, Family History, and Social History have all been reviewed.  Patient Active Problem List   Diagnosis Date Noted  . URI (upper respiratory infection) 02/10/2017  . Nasal congestion 05/15/2016  . Hair loss 03/25/2016  . Vitamin D deficiency 01/15/2016  . Routine general medical examination at a health care facility 05/29/2014  . Encounter for routine gynecological examination 05/29/2014  . Screening for HIV (human immunodeficiency virus) 05/29/2014  . Encounter for screening mammogram for breast cancer 05/29/2014  . Obesity 05/29/2014  . Joint pain 04/10/2014  . History of DVT (deep vein thrombosis) 07/06/2012  . Varicose veins of lower extremities with other complications 82/50/5397  . Unspecified venous (peripheral) insufficiency 12/23/2011  . Venous  insufficiency 11/25/2011  . Phlebitis and thrombophlebitis of the leg 11/06/2011  . Venous insufficiency, peripheral 10/26/2011  . Pedal edema 10/26/2011  . IRREGULAR MENSES 02/29/2008  . 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 DVT (deep venous thrombosis) ([place])   . Varicose veins     Past Surgical History:  Procedure Laterality Date  . CHOLECYSTECTOMY  Feb. 2006   Gall Bladder  . THROMBECTOMY Right 01/13/12 and  01/21/12   Right iliac vein stent and Vein thrombosis    Social History   Socioeconomic History  . Marital status: Divorced    Spouse name: Not on file  . Number of children: Not on file  . Years of education: Not on file  . Highest education level: Not on file  Social Needs  . Financial resource strain: Not on file  . Food insecurity - worry: Not on file  . Food insecurity - inability: Not on file  . Transportation needs - medical: Not on file  . Transportation needs - non-medical: Not on file  Occupational History  . Not on file  Tobacco Use  . Smoking status: Never Smoker  . Smokeless tobacco: Never Used  Substance and Sexual Activity  . Alcohol use: No    Alcohol/week: 0.0 oz  . Drug use: No  . Sexual activity: Not on file  Other Topics Concern  . Not on file  Social History Narrative  . Not on file    Family History  Problem Relation Age of Onset  . Arthritis Mother   . Cancer  Mother   . Heart disease Father        Heart Disease before age 21  . Cancer Father   . Hypertension Father   . Heart attack Father     Allergies  Allergen Reactions  . Penicillins Rash  . Sulfa Antibiotics Rash    Medication list reviewed and updated in full in [place].  ROS as above, eating and drinking - tolerating PO. Urinating normally. No excessive vomitting or diarrhea. O/w as above.  Objective:   Blood pressure 118/90, pulse 82, temperature 98.4 F (36.9 C), temperature source Oral, height 5' 9.5"  (1.765 m), weight 234 lb (106.1 kg), last menstrual period 01/24/2017, SpO2 97 %.  GEN: WDWN, Non-toxic, Atraumatic, normocephalic. A and O x 3. HEENT: Oropharynx clear without exudate, MMM, no significant LAD, mild rhinnorhea Sinuses: Right Frontal, ethmoid, and maxillary: Tender Left Frontal, Ethmoid, and maxillary: Tender Ears: TM clear, COL visualized with good landmarks CV: RRR, no m/g/r. Pulm: CTA B, no wheezes, rhonchi, or crackles, normal respiratory effort. EXT: no c/c/e Psych: well oriented, neither depressed nor anxious in appearance  Assessment and Plan:   Acute non-recurrent maxillary sinusitis  Acute sinusitis: ABX as below.   Reviewed symptomatic care as well as ABX in this case.   PCN allergic, zpak  Follow-up: No Follow-up on file.  Signed,  Maud Deed. Reford Olliff, MD    Medication List        Accurate as of 02/17/17  1:55 PM. Always use your most recent med list.          azithromycin 250 MG tablet Commonly known as:  ZITHROMAX 2 tabs po on day 1, then 1 tab po for 4 days   benzonatate 200 MG capsule Commonly known as:  TESSALON Take 1 capsule (200 mg total) by mouth 3 (three) times daily as needed.   loratadine 10 MG tablet Commonly known as:  CLARITIN   NON FORMULARY   triamcinolone 55 MCG/ACT Aero nasal inhaler Commonly known as:  NASACORT Place 2 sprays into the nose daily.   Vitamin D3 2000 units Tabs       Where to Get Your Medications    These medications were sent to [id number] - [place], [place], [place] [address]   Phone:  [phone]   azithromycin 250 MG tablet

## 2017-02-23 ENCOUNTER — Telehealth: Payer: Self-pay | Admitting: Family Medicine

## 2017-02-23 NOTE — Telephone Encounter (Signed)
Left message asking pt to call office have questions about fmla  Was pt out of work from 02/08/17 to 02/17/17 ??

## 2017-02-25 NOTE — Telephone Encounter (Signed)
fmla paperwork in dr Josefine Class in box for review and signature  Spoke with pt she was out of work from 02/08/17/ to 02/14/17 then she was out 02/16/17 and 02/17/17 She saw dr copland on 11/7.

## 2017-02-26 NOTE — Telephone Encounter (Signed)
Form signed. Thanks!

## 2017-03-01 NOTE — Telephone Encounter (Signed)
Paperwork faxed 11/16 Left message letting pt know paperwork has been faxed Copy for file Copy for scan Copy for pt

## 2017-03-12 ENCOUNTER — Telehealth: Payer: Self-pay | Admitting: Family Medicine

## 2017-03-12 NOTE — Telephone Encounter (Signed)
Faxing last 2 office notes

## 2017-03-12 NOTE — Telephone Encounter (Signed)
[facility] faxed paperwork wanting additional information.  In dr duncan's in box

## 2017-03-14 NOTE — Telephone Encounter (Signed)
Let me know if that doesn't suffice.  Thanks.

## 2017-05-11 ENCOUNTER — Ambulatory Visit: Payer: Managed Care, Other (non HMO) | Admitting: Internal Medicine

## 2017-05-11 ENCOUNTER — Encounter: Payer: Self-pay | Admitting: Internal Medicine

## 2017-05-11 VITALS — BP 118/82 | HR 76 | Temp 97.9°F | Wt 235.0 lb

## 2017-05-11 DIAGNOSIS — N898 Other specified noninflammatory disorders of vagina: Secondary | ICD-10-CM | POA: Diagnosis not present

## 2017-05-11 MED ORDER — FLUCONAZOLE 150 MG PO TABS: 150.0000 mg | ORAL_TABLET | Freq: Once | ORAL | 0 refills | Status: AC

## 2017-05-11 NOTE — Addendum Note (Signed)
Addended by: Ellamae Sia on: 05/11/2017 09:56 AM   Modules accepted: Orders

## 2017-05-11 NOTE — Patient Instructions (Signed)

## 2017-05-11 NOTE — Progress Notes (Signed)
Subjective:    Patient ID: Kari Rice, female    DOB: 05-Feb-1976, 42 y.o.   MRN: [id number]  HPI  Pt presents to the clinic today with c/o yellow vaginal discharge and odor. She noticed this yesterday. She denies pelvic pain, vaginal itching or irritation, or abnormal bleeding. She has not tried anything OTC for her symptoms. She is not sexually active. She reports she has tested positive for trich in the past, but reports she has not been sexually active in 3-4 years.  Review of Systems      Past Medical History:  Diagnosis Date  . DVT (deep venous thrombosis) ([place])   . Varicose veins     Current Outpatient Medications  Medication Sig Dispense Refill  . Cholecalciferol (VITAMIN D3) 2000 units TABS Take 1 tablet by mouth daily.    Marland Kitchen loratadine (CLARITIN) 10 MG tablet Take 10 mg daily by mouth.    . NON FORMULARY Support hose to the waist for dx of venous insufficiency and edema 15-20 mm Hg    . triamcinolone (NASACORT) 55 MCG/ACT AERO nasal inhaler Place 2 sprays into the nose daily.     No current facility-administered medications for this visit.     Allergies  Allergen Reactions  . Penicillins Rash  . Sulfa Antibiotics Rash    Family History  Problem Relation Age of Onset  . Arthritis Mother   . Cancer Mother   . Heart disease Father        Heart Disease before age 26  . Cancer Father   . Hypertension Father   . Heart attack Father     Social History   Socioeconomic History  . Marital status: Divorced    Spouse name: Not on file  . Number of children: Not on file  . Years of education: Not on file  . Highest education level: Not on file  Social Needs  . Financial resource strain: Not on file  . Food insecurity - worry: Not on file  . Food insecurity - inability: Not on file  . Transportation needs - medical: Not on file  . Transportation needs - non-medical: Not on file  Occupational History  . Not on file  Tobacco Use  . Smoking status: Never  Smoker  . Smokeless tobacco: Never Used  Substance and Sexual Activity  . Alcohol use: No    Alcohol/week: 0.0 oz  . Drug use: No  . Sexual activity: Not on file  Other Topics Concern  . Not on file  Social History Narrative  . Not on file     Constitutional: Denies fever, malaise, fatigue, headache or abrupt weight changes.  Gastrointestinal: Denies abdominal pain, bloating, constipation, diarrhea or blood in the stool.  GU: Pt reports vaginal discharge and odor. Denies urgency, frequency, pain with urination, burning sensation, blood in urine.   No other specific complaints in a complete review of systems (except as listed in HPI above).  Objective:   Physical Exam    BP 118/82   Pulse 76   Temp 97.9 F (36.6 C) (Oral)   Wt 235 lb (106.6 kg)   LMP 04/23/2016   SpO2 98%   BMI 34.21 kg/m  Wt Readings from Last 3 Encounters:  05/11/17 235 lb (106.6 kg)  02/17/17 234 lb (106.1 kg)  02/09/17 237 lb (107.5 kg)    General: Appears her stated age, obese in NAD. Abdomen: Soft and nontender.  Pelvic: Self swabbed.  BMET    Component Value Date/Time  NA 144 01/14/2016 1523   K 3.7 01/14/2016 1523   CL 103 01/14/2016 1523   CO2 25 01/14/2016 1523   GLUCOSE 96 01/14/2016 1523   GLUCOSE 88 12/24/2011 0645   BUN 17 01/14/2016 1523   CREATININE 0.65 01/14/2016 1523   CALCIUM 9.2 01/14/2016 1523   GFRNONAA 110 01/14/2016 1523   GFRAA 127 01/14/2016 1523    Lipid Panel     Component Value Date/Time   CHOL 178 01/14/2016 1523   TRIG 104 01/14/2016 1523   HDL 58 01/14/2016 1523   CHOLHDL 3.1 01/14/2016 1523   LDLCALC 99 01/14/2016 1523    CBC    Component Value Date/Time   WBC 10.4 01/14/2016 1523   WBC 9.7 12/30/2011 0502   RBC 4.29 01/14/2016 1523   RBC 3.95 12/30/2011 0502   HGB 13.0 01/14/2016 1523   HCT 39.5 01/14/2016 1523   PLT 263 01/14/2016 1523   MCV 92 01/14/2016 1523   MCH 30.3 01/14/2016 1523   MCH 29.1 12/30/2011 0502   MCHC 32.9  01/14/2016 1523   MCHC 33.4 12/30/2011 0502   RDW 13.1 01/14/2016 1523   LYMPHSABS 3.9 (H) 01/14/2016 1523   MONOABS 0.6 12/24/2011 0645   EOSABS 0.6 (H) 01/14/2016 1523   BASOSABS 0.0 01/14/2016 1523    Hgb A1C No results found for: HGBA1C        Assessment & Plan:   Vaginal Discharge and Odor:  Wet prep: + yeast eRx for Diflucan 150 mg PO x 1 Will send urine for gonorrhea, chlamydia and trichomonas  Will follow up with the results, return precautions discussed Webb Silversmith, NP

## 2017-05-14 LAB — TRICHOMONAS VAGINALIS, PROBE AMP: Trich vag by NAA: NEGATIVE

## 2017-05-28 ENCOUNTER — Encounter: Payer: Self-pay | Admitting: Family Medicine

## 2017-05-28 ENCOUNTER — Ambulatory Visit: Payer: Managed Care, Other (non HMO) | Admitting: Family Medicine

## 2017-05-28 VITALS — BP 126/84 | HR 84 | Temp 98.1°F | Ht 69.5 in | Wt 237.0 lb

## 2017-05-28 DIAGNOSIS — M25531 Pain in right wrist: Secondary | ICD-10-CM | POA: Insufficient documentation

## 2017-05-28 DIAGNOSIS — I80299 Phlebitis and thrombophlebitis of other deep vessels of unspecified lower extremity: Secondary | ICD-10-CM

## 2017-05-28 MED ORDER — MELOXICAM 15 MG PO TABS: 15.0000 mg | ORAL_TABLET | Freq: Every day | ORAL | 0 refills | Status: DC

## 2017-05-28 NOTE — Patient Instructions (Signed)
Get a carpal tunnel wrist splint at a drug store and wear it at night  Use cold compress before and after work to affected areas  Take the meloxicam daily with food   In 2 weeks if not improved - call and let me know

## 2017-05-28 NOTE — Assessment & Plan Note (Signed)
Improved No longer on anticoagulation

## 2017-05-28 NOTE — Assessment & Plan Note (Signed)
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

## 2017-05-28 NOTE — Progress Notes (Signed)
Subjective:    Patient ID: Kari Rice, female    DOB: January 08, 1976, 42 y.o.   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 Right wrist pain 05/28/2017  . URI (upper respiratory infection) 02/10/2017  . Nasal congestion 05/15/2016  . Hair loss 03/25/2016  . Vitamin D deficiency 01/15/2016  . Routine general medical examination at a health care facility 05/29/2014  . Encounter for routine gynecological examination 05/29/2014  . Screening for HIV (human immunodeficiency virus) 05/29/2014  . Encounter for screening mammogram for breast cancer 05/29/2014  . Obesity 05/29/2014  . Joint pain 04/10/2014  . History of DVT (deep vein thrombosis) 07/06/2012  . Varicose veins of lower extremities with other complications 82/50/0370  . Unspecified venous (peripheral) insufficiency 12/23/2011  . Venous insufficiency 11/25/2011  . Phlebitis and thrombophlebitis of the leg 11/06/2011  . Venous insufficiency, peripheral 10/26/2011  . Pedal edema 10/26/2011  . IRREGULAR MENSES 02/29/2008  . 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 DVT (deep venous thrombosis) ([place])   . Varicose veins    Past Surgical History:  Procedure Laterality Date  . CHOLECYSTECTOMY  Feb. 2006   Gall Bladder  . THROMBECTOMY Right 01/13/12 and  01/21/12   Right iliac vein stent and  Vein thrombosis   Social History   Tobacco Use  . Smoking status: Never Smoker  . Smokeless tobacco: Never Used  Substance Use Topics  . Alcohol use: No    Alcohol/week: 0.0 oz  . Drug use: No   Family History  Problem Relation Age of Onset  . Arthritis Mother   . Cancer Mother   . Heart disease Father        Heart Disease before age 63  . Cancer Father   . Hypertension Father   . Heart attack Father    Allergies  Allergen Reactions  . Penicillins Rash  . Sulfa Antibiotics Rash   Current Outpatient Medications on File Prior to Visit  Medication Sig Dispense Refill  . Cholecalciferol (VITAMIN D3) 2000 units TABS Take 1 tablet by mouth daily.    Marland Kitchen loratadine (CLARITIN) 10 MG tablet Take 10 mg daily by mouth.    . NON FORMULARY Support hose to the waist for dx of venous insufficiency and edema 15-20 mm Hg    . triamcinolone (NASACORT) 55 MCG/ACT AERO nasal inhaler Place 2 sprays into the nose daily.     No current facility-administered medications on file prior to visit.     Review of Systems  Constitutional: Negative for activity change, appetite change, fatigue, fever and unexpected weight change.  HENT: Negative for congestion, ear pain, rhinorrhea, sinus pressure and sore throat.   Eyes: Negative  for pain, redness and visual disturbance.  Respiratory: Negative for cough, shortness of breath and wheezing.   Cardiovascular: Negative for chest pain and palpitations.  Gastrointestinal: Negative for abdominal pain, blood in stool, constipation and diarrhea.  Endocrine: Negative for polydipsia and polyuria.  Genitourinary: Negative for dysuria, frequency and urgency.  Musculoskeletal: Positive for arthralgias. Negative for back pain and myalgias.       Right wrist pain   Skin: Negative for pallor and rash.  Allergic/Immunologic: Negative for environmental allergies.  Neurological: Negative for dizziness, syncope and headaches.  Hematological: Negative for adenopathy.  Does not bruise/bleed easily.  Psychiatric/Behavioral: Negative for decreased concentration and dysphoric mood. The patient is not nervous/anxious.        Objective:   Physical Exam  Constitutional: She appears well-developed and well-nourished. No distress.  obese and well appearing   HENT:  Head: Normocephalic and atraumatic.  Neck: Normal range of motion. Neck supple.  Musculoskeletal: Normal range of motion. She exhibits tenderness. She exhibits no edema or deformity.       Right wrist: She exhibits tenderness and bony tenderness. She exhibits normal range of motion, no swelling, no effusion, no crepitus and no deformity.       Right hand: She exhibits normal range of motion, no tenderness, no bony tenderness, normal two-point discrimination, normal capillary refill, no deformity and no swelling. Normal sensation noted. Normal strength noted.  R wrist - dorsal pain with full flexion and also pronation/supination  Tender over extensor tendons  Nl rom with discomfort No swelling/eff/crepitus   Nl grip  Nl sens to lt touch /temp  Nl DTRs     Lymphadenopathy:    She has no cervical adenopathy.  Neurological: She has normal strength. She displays no atrophy. No sensory deficit. She exhibits normal muscle tone.  Skin: Skin is warm and dry. No rash noted. No erythema. No pallor.  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

## 2017-07-07 ENCOUNTER — Ambulatory Visit: Payer: Managed Care, Other (non HMO) | Admitting: Family Medicine

## 2017-07-07 ENCOUNTER — Encounter: Payer: Self-pay | Admitting: Family Medicine

## 2017-07-07 VITALS — BP 128/86 | HR 79 | Temp 98.5°F | Ht 69.5 in | Wt 233.0 lb

## 2017-07-07 DIAGNOSIS — R51 Headache: Secondary | ICD-10-CM | POA: Diagnosis not present

## 2017-07-07 DIAGNOSIS — E6609 Other obesity due to excess calories: Secondary | ICD-10-CM

## 2017-07-07 DIAGNOSIS — R519 Headache, unspecified: Secondary | ICD-10-CM | POA: Insufficient documentation

## 2017-07-07 DIAGNOSIS — Z6838 Body mass index (BMI) 38.0-38.9, adult: Secondary | ICD-10-CM

## 2017-07-07 DIAGNOSIS — G510 Bell's palsy: Secondary | ICD-10-CM | POA: Diagnosis not present

## 2017-07-07 MED ORDER — VALACYCLOVIR HCL 1 G PO TABS: 1000.0000 mg | ORAL_TABLET | Freq: Two times a day (BID) | ORAL | 0 refills | Status: DC

## 2017-07-07 MED ORDER — PREDNISONE 10 MG PO TABS: ORAL_TABLET | ORAL | 0 refills | Status: DC

## 2017-07-07 NOTE — Progress Notes (Signed)
Subjective:    Patient ID: Kari Rice, female    DOB: 1976/02/03, 42 y.o.   MRN: [id number]  HPI  Here for symptoms of headache with dizziness /ear pain and facial changes   Started with burning/pain in L ear (Friday) Ear pain over the weekend  Monday night woke up with L sided facial droop (felt swollen) and trouble closing her eye  Headache was over whole head on Friday and went away Friday night (took tylenol - mild to moderate)  No hx of migraine   (brother has them)  No trouble speaking   Of note-her dad had bell's palsy   A little dizzy at first  No fever  No rash  No trauma /emotional or physical   Working a lot lately    Wt Readings from Last 3 Encounters:  07/07/17 233 lb (105.7 kg)  05/28/17 237 lb (107.5 kg)  05/11/17 235 lb (106.6 kg)   33.91 kg/m   BP Readings from Last 3 Encounters:  07/07/17 128/86  05/28/17 126/84  05/11/17 118/82   Pulse Readings from Last 3 Encounters:  07/07/17 79  05/28/17 84  05/11/17 76    Patient Active Problem List   Diagnosis Date Noted  . Bell's palsy 07/07/2017  . Headache 07/07/2017  . Right wrist pain 05/28/2017  . Phlebitis and thrombophlebitis of other deep vessels of unspecified lower extremity ([place]) 05/28/2017  . Nasal congestion 05/15/2016  . Hair loss 03/25/2016  . Vitamin D deficiency 01/15/2016  . Routine general medical examination at a health care facility 05/29/2014  . Encounter for routine gynecological examination 05/29/2014  . Screening for HIV (human immunodeficiency virus) 05/29/2014  . Encounter for screening mammogram for breast cancer 05/29/2014  . Obesity 05/29/2014  . Joint pain 04/10/2014  . History of DVT (deep vein thrombosis) 07/06/2012  . Varicose veins of lower extremities with other complications 76/19/5093  . Unspecified venous (peripheral) insufficiency 12/23/2011  . Venous insufficiency 11/25/2011  . Phlebitis and thrombophlebitis of the leg 11/06/2011  . Venous  insufficiency, peripheral 10/26/2011  . Pedal edema 10/26/2011  . IRREGULAR MENSES 02/29/2008  . 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 DVT (deep venous thrombosis) ([place])   . Varicose veins    Past Surgical History:  Procedure Laterality Date  . CHOLECYSTECTOMY  Feb. 2006   Gall Bladder  . THROMBECTOMY Right 01/13/12 and  01/21/12   Right iliac vein stent and Vein thrombosis   Social History   Tobacco Use  . Smoking status: Never Smoker  . Smokeless tobacco: Never Used  Substance Use Topics  . Alcohol use: No    Alcohol/week: 0.0 oz  . Drug use: No   Family History  Problem Relation Age of Onset  . Arthritis Mother   . Cancer Mother   . Heart disease Father        Heart Disease before age 7  . Cancer Father   . Hypertension Father   . Heart attack Father    Allergies  Allergen Reactions  . Penicillins Rash  . Sulfa Antibiotics Rash   Current Outpatient Medications on File Prior to Visit  Medication Sig Dispense Refill  . Cholecalciferol (VITAMIN D3) 2000 units TABS Take 1 tablet by mouth daily.    Marland Kitchen loratadine (CLARITIN) 10 MG tablet Take 10 mg daily by mouth.    . NON FORMULARY Support hose to the waist for dx of venous insufficiency and edema 15-20 mm Hg    .  triamcinolone (NASACORT) 55 MCG/ACT AERO nasal inhaler Place 2 sprays into the nose daily.     No current facility-administered medications on file prior to visit.     Review of Systems  Constitutional: Negative for activity change, appetite change, fatigue, fever and unexpected weight change.  HENT: Negative for congestion, ear pain, rhinorrhea, sinus pressure and sore throat.   Eyes: Negative for pain, redness and visual disturbance.  Respiratory: Negative for cough, shortness of breath and wheezing.   Cardiovascular: Negative for chest pain and palpitations.  Gastrointestinal: Negative for abdominal pain, blood in stool, constipation and diarrhea.  Endocrine:  Negative for polydipsia and polyuria.  Genitourinary: Negative for dysuria, frequency and urgency.  Musculoskeletal: Negative for arthralgias, back pain and myalgias.  Skin: Negative for pallor and rash.  Allergic/Immunologic: Negative for environmental allergies.  Neurological: Positive for facial asymmetry and headaches. Negative for dizziness, tremors, seizures, syncope, speech difficulty, weakness, light-headedness and numbness.  Hematological: Negative for adenopathy. Does not bruise/bleed easily.  Psychiatric/Behavioral: Negative for decreased concentration and dysphoric mood. The patient is not nervous/anxious.        Objective:   Physical Exam  Constitutional: She appears well-developed and well-nourished. No distress.  obese and well appearing   HENT:  Head: Normocephalic and atraumatic.  Right Ear: External ear normal.  Left Ear: External ear normal.  Nose: Nose normal.  Mouth/Throat: Oropharynx is clear and moist.  Eyes: Pupils are equal, round, and reactive to light. Conjunctivae and EOM are normal. Right eye exhibits no discharge. Left eye exhibits no discharge. No scleral icterus.  Pt is able to close L eye on her own - but cannot keep it closed against resistance Nl eoms  Neck: Normal range of motion. Neck supple. No JVD present. Carotid bruit is not present. No thyromegaly present.  Cardiovascular: Normal rate, normal heart sounds and intact distal pulses. Exam reveals no gallop and no friction rub.  No murmur heard. Pulmonary/Chest: Effort normal and breath sounds normal. No respiratory distress. She has no wheezes. She has no rales. She exhibits no tenderness.  Musculoskeletal: She exhibits no edema, tenderness or deformity.  Lymphadenopathy:    She has no cervical adenopathy.  Neurological: She is alert. She has normal reflexes. She displays no atrophy and no tremor. A cranial nerve deficit is present. No sensory deficit. She exhibits normal muscle tone. She displays  a negative Romberg sign. She displays no seizure activity. Coordination and gait normal.  L sided facial droop (mild) consistent with Bell's Palsy Some difficulty keeping L eye closed against resistance  No sensory changes   Nl gait   Skin: Skin is warm and dry. No rash noted. No erythema. No pallor.  Psychiatric: She has a normal mood and affect.  Mood is good           Assessment & Plan:   Problem List Items Addressed This Visit      Nervous and Auditory   Bell's palsy - Primary    On L side- which started with headache and ear burning sensation that are now improved  No rash noted  Disc taping eye shut if needed and use of artificial tears  Watching for rash/ speech problems/worse ha or other  tx with prednisont 40 mg taper Valtrex 1 g tid 7 d  Update  F/u 1 mo       Relevant Orders   CBC with Differential/Platelet   High sensitivity CRP   Sedimentation rate     Other   Headache  Started Friday with sensation of L ear burning and now has bell's palsy  The pain is much improved Check cbc/crp/esr today  Update       Relevant Orders   CBC with Differential/Platelet   High sensitivity CRP   Sedimentation rate

## 2017-07-07 NOTE — Assessment & Plan Note (Signed)
Started Friday with sensation of L ear burning and now has bell's palsy  The pain is much improved Check cbc/crp/esr today  Update

## 2017-07-07 NOTE — Patient Instructions (Addendum)
I think you have Bell's Palsy  See the handout I gave you  Use lubricant eye drops/artificial tears in your left eye 3 or more times daily  If you cannot close eye to sleep - tape it closed gently (during day time also if needed)  Alert me if eye pain/worse headache or other symptoms like rash   Take the prednisone as directed  Take the valtrex as directed   Labs today   Follow up in about a month or earlier if symptoms worsen

## 2017-07-07 NOTE — Assessment & Plan Note (Signed)
On L side- which started with headache and ear burning sensation that are now improved  No rash noted  Disc taping eye shut if needed and use of artificial tears  Watching for rash/ speech problems/worse ha or other  tx with prednisont 40 mg taper Valtrex 1 g tid 7 d  Update  F/u 1 mo

## 2017-07-08 LAB — CBC WITH DIFFERENTIAL/PLATELET
BASOS ABS: 0 10*3/uL (ref 0.0–0.2)
BASOS: 0 %
EOS (ABSOLUTE): 0.3 10*3/uL (ref 0.0–0.4)
Eos: 3 %
HEMOGLOBIN: 14.1 g/dL (ref 11.1–15.9)
Hematocrit: 42.7 % (ref 34.0–46.6)
IMMATURE GRANS (ABS): 0 10*3/uL (ref 0.0–0.1)
Immature Granulocytes: 0 %
LYMPHS ABS: 4.2 10*3/uL — AB (ref 0.7–3.1)
LYMPHS: 37 %
MCH: 29.1 pg (ref 26.6–33.0)
MCHC: 33 g/dL (ref 31.5–35.7)
MCV: 88 fL (ref 79–97)
Monocytes Absolute: 0.6 10*3/uL (ref 0.1–0.9)
Monocytes: 5 %
NEUTROS ABS: 6.2 10*3/uL (ref 1.4–7.0)
Neutrophils: 55 %
PLATELETS: 318 10*3/uL (ref 150–379)
RBC: 4.84 x10E6/uL (ref 3.77–5.28)
RDW: 12.7 % (ref 12.3–15.4)
WBC: 11.4 10*3/uL — ABNORMAL HIGH (ref 3.4–10.8)

## 2017-07-08 LAB — HIGH SENSITIVITY CRP: CRP, High Sensitivity: 2.62 mg/L (ref 0.00–3.00)

## 2017-07-08 LAB — SEDIMENTATION RATE: Sed Rate: 4 mm/hr (ref 0–32)

## 2017-07-12 ENCOUNTER — Telehealth: Payer: Self-pay | Admitting: Family Medicine

## 2017-07-12 DIAGNOSIS — Z0279 Encounter for issue of other medical certificate: Secondary | ICD-10-CM

## 2017-07-12 NOTE — Telephone Encounter (Signed)
[facility] faxed attending provider statement In dr tower in box for review and signature

## 2017-07-12 NOTE — Telephone Encounter (Signed)
Done and in IN box 

## 2017-07-13 NOTE — Telephone Encounter (Signed)
Given to Robin 

## 2017-07-13 NOTE — Telephone Encounter (Signed)
Paperwork faxed °Pt aware °Copy for scan °Copy for billing °Copy for pt °

## 2017-07-14 ENCOUNTER — Telehealth: Payer: Self-pay

## 2017-07-14 NOTE — Telephone Encounter (Signed)
Pt seen on 07/07/17 for bells palsy.Please advise.

## 2017-07-14 NOTE — Telephone Encounter (Signed)
She had an appt for 1 mo f/u- lets move that up to Friday if she can come-(any opening is fine)- to re check her  Alert [place] if new symptoms in the meantime   Thanks

## 2017-07-14 NOTE — Telephone Encounter (Signed)
Copied from [place] [id number]. Topic: General - Other >> Jul 14, 2017  8:19 AM Scherrie Gerlach wrote: Reason for CRM: pt states she was to update Dr Glori Bickers on her Bell's Palsy dx.  Pt states she had to stay out of work last night, had some facial and ear pain like needles sticking in her face. Pt wants to know if that is to be expected during the recovery. Needle pain under left eye. Pt states she is still not able to close left eye, that is not much better.

## 2017-07-14 NOTE — Telephone Encounter (Signed)
Left VM requesting pt to call the office back, CRM created 

## 2017-07-14 NOTE — Telephone Encounter (Signed)
Not unusual -please ask if she has seen any rash at all?   Any fever or sinus symptoms?

## 2017-07-14 NOTE — Telephone Encounter (Signed)
Left VM requesting pt to call the office back 

## 2017-07-14 NOTE — Telephone Encounter (Signed)
Patient called back and said just a runny nose, no fever or rash. Please advise

## 2017-07-15 NOTE — Telephone Encounter (Signed)
Pt called back and scheduled appt

## 2017-07-16 ENCOUNTER — Encounter: Payer: Self-pay | Admitting: Family Medicine

## 2017-07-16 ENCOUNTER — Ambulatory Visit: Payer: Managed Care, Other (non HMO) | Admitting: Family Medicine

## 2017-07-16 VITALS — BP 118/78 | HR 78 | Temp 98.5°F | Ht 69.5 in | Wt 233.5 lb

## 2017-07-16 DIAGNOSIS — G510 Bell's palsy: Secondary | ICD-10-CM

## 2017-07-16 DIAGNOSIS — E559 Vitamin D deficiency, unspecified: Secondary | ICD-10-CM

## 2017-07-16 DIAGNOSIS — Z0000 Encounter for general adult medical examination without abnormal findings: Secondary | ICD-10-CM

## 2017-07-16 MED ORDER — GABAPENTIN 100 MG PO CAPS: 100.0000 mg | ORAL_CAPSULE | Freq: Three times a day (TID) | ORAL | 3 refills | Status: DC

## 2017-07-16 MED ORDER — MELOXICAM 15 MG PO TABS: 15.0000 mg | ORAL_TABLET | Freq: Every day | ORAL | 1 refills | Status: DC

## 2017-07-16 NOTE — Progress Notes (Signed)
Subjective:    Patient ID: Kari Rice, female    DOB: 05/26/1975, 42 y.o.   MRN: [id number]  HPI Here for f/u of Bells palsy   She was seen 3/27 tx with valtrex and prednisone    Wt Readings from Last 3 Encounters:  07/16/17 233 lb 8 oz (105.9 kg)  07/07/17 233 lb (105.7 kg)  05/28/17 237 lb (107.5 kg)   33.99 kg/m   She called to let us know she still has some facial and ear pain- like "needles" sticking in her face  Under L eye Still cannot fully close the eye (but can sleep w/o taping it- that is improved)  Has had to leave work   Facial pain is worse   No eye pain  No dry eye symptoms   No sinus symptoms - no nasal d/c  Runny nose from allergies  Taking her claritin   Not taking ibuprofen or other otc medicines   Few more valtrex to take   Still has some meloxicam  Has appt upcoming April 30- will do lab today and change to a physical   Patient Active Problem List   Diagnosis Date Noted  . Bell's palsy 07/07/2017  . Headache 07/07/2017  . Phlebitis and thrombophlebitis of other deep vessels of unspecified lower extremity ([place]) 05/28/2017  . Nasal congestion 05/15/2016  . Hair loss 03/25/2016  . Vitamin D deficiency 01/15/2016  . Routine general medical examination at a health care facility 05/29/2014  . Encounter for routine gynecological examination 05/29/2014  . Screening for HIV (human immunodeficiency virus) 05/29/2014  . Encounter for screening mammogram for breast cancer 05/29/2014  . Obesity 05/29/2014  . Joint pain 04/10/2014  . History of DVT (deep vein thrombosis) 07/06/2012  . Varicose veins of lower extremities with other complications 85/63/1497  . Unspecified venous (peripheral) insufficiency 12/23/2011  . Venous insufficiency 11/25/2011  . Phlebitis and thrombophlebitis of the leg 11/06/2011  . Venous insufficiency, peripheral 10/26/2011  . Pedal edema 10/26/2011  . IRREGULAR MENSES 02/29/2008  . 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 DVT (deep venous thrombosis) ([place])   . Varicose veins    Past Surgical History:  Procedure Laterality Date  . CHOLECYSTECTOMY  Feb. 2006   Gall Bladder  . THROMBECTOMY Right 01/13/12 and  01/21/12   Right iliac vein stent and Vein thrombosis   Social History   Tobacco Use  . Smoking status: Never Smoker  . Smokeless tobacco: Never Used  Substance Use Topics  . Alcohol use: No    Alcohol/week: 0.0 oz  . Drug use: No   Family History  Problem Relation Age of Onset  . Arthritis Mother   . Cancer Mother   . Heart disease Father        Heart Disease before age 69  . Cancer Father   . Hypertension Father   . Heart attack Father    Allergies  Allergen Reactions  . Penicillins Rash  . Sulfa Antibiotics Rash   Current Outpatient Medications on File Prior to Visit  Medication Sig Dispense Refill  . Cholecalciferol (VITAMIN D3) 2000 units TABS Take 1 tablet by mouth daily.    Marland Kitchen loratadine (CLARITIN) 10 MG tablet Take 10 mg daily by mouth.    . NON FORMULARY Support hose to the waist for dx of venous insufficiency and edema 15-20 mm Hg    . triamcinolone (NASACORT) 55 MCG/ACT AERO nasal inhaler Place 2 sprays into the  nose daily.    . valACYclovir (VALTREX) 1000 MG tablet Take 1 tablet (1,000 mg total) by mouth 2 (two) times daily. 20 tablet 0   No current facility-administered medications on file prior to visit.      Review of Systems  Constitutional: Negative for activity change, appetite change, fatigue, fever and unexpected weight change.  HENT: Positive for ear pain and rhinorrhea. Negative for congestion, ear discharge, hearing loss, sinus pressure, sinus pain, sneezing and sore throat.   Eyes: Negative for pain, redness and visual disturbance.  Respiratory: Negative for cough, shortness of breath and wheezing.   Cardiovascular: Negative for chest pain and palpitations.  Gastrointestinal: Negative for abdominal pain, blood  in stool, constipation and diarrhea.  Endocrine: Negative for polydipsia and polyuria.  Genitourinary: Negative for dysuria, frequency and urgency.  Musculoskeletal: Negative for arthralgias, back pain and myalgias.  Skin: Negative for pallor and rash.  Allergic/Immunologic: Negative for environmental allergies.  Neurological: Positive for facial asymmetry and numbness. Negative for dizziness, tremors, seizures, syncope, speech difficulty, weakness, light-headedness and headaches.       Tingling in L face /some pain (not totally numb)   Hematological: Negative for adenopathy. Does not bruise/bleed easily.  Psychiatric/Behavioral: Negative for decreased concentration and dysphoric mood. The patient is not nervous/anxious.           Objective:   Physical Exam  Constitutional: She appears well-developed and well-nourished. No distress.  obese and well appearing   HENT:  Head: Normocephalic and atraumatic.  Right Ear: External ear normal.  Left Ear: External ear normal.  Nose: Nose normal.  Mouth/Throat: Oropharynx is clear and moist.  Baseline L facial droop -slt improved Is able to fully close eye  Hypersensitive over L side of face and L ear   Ear canals/TMS clear  No rash  Eyes: Pupils are equal, round, and reactive to light. Conjunctivae and EOM are normal. Right eye exhibits no discharge. Left eye exhibits no discharge. No scleral icterus.  Pt can fully close L eye with effort  Eye does not appear dry/no conj changes No rash  Neck: Normal range of motion. Neck supple. No JVD present. Carotid bruit is not present. No thyromegaly present.  Cardiovascular: Normal rate, regular rhythm, normal heart sounds and intact distal pulses. Exam reveals no gallop.  Pulmonary/Chest: Effort normal and breath sounds normal. No respiratory distress. She has no wheezes. She has no rales.  No crackles  Abdominal: She exhibits no abdominal bruit.  Musculoskeletal: She exhibits no edema.    Lymphadenopathy:    She has no cervical adenopathy.  Neurological: She is alert. She has normal reflexes. She displays no atrophy, no tremor and normal reflexes. A cranial nerve deficit is present. No sensory deficit. She exhibits normal muscle tone. Coordination and gait normal.  Baseline L facial droop from Bells palsy- slightly improved No rash  Hypersensitive L cheek and ear   Skin: Skin is warm and dry. No rash noted. No erythema. No pallor.  Psychiatric: She has a normal mood and affect.  Pleasant and talkative          Assessment & Plan:   Problem List Items Addressed This Visit      Nervous and Auditory   Bell's palsy - Primary    Mild improvement in facial droop  More sharp/tingly pain in face and ear  Reassuring exam  Recommend ear phones instead of ear plugs at work  [employer]  Done with predisone-can take meloxicam daily with food for pain/inflammation  Px gabapentin for pain - 100 mg bid-titrate to tid as tolerated and call if want to inc dose  Info given on medication  F/u April 30 for her PE       Relevant Medications   gabapentin (NEURONTIN) 100 MG capsule   Other Relevant Orders   CBC with Differential/Platelet   Comprehensive metabolic panel   Lipid panel   TSH     Other   Routine general medical examination at a health care facility    Labs for upcoming PE at end of the mo      Relevant Orders   CBC with Differential/Platelet   Comprehensive metabolic panel   Lipid panel   TSH   Vitamin D deficiency   Relevant Orders   VITAMIN D 25 Hydroxy (Vit-D Deficiency, Fractures)

## 2017-07-16 NOTE — Patient Instructions (Addendum)
Continue a lubricating eye drop  Tape eye if needed   Finish the valtrex  Then start melcoxicam with food once daily as needed (for pain)   Also start gabapentin 100 mg - twice daily- am and pm Once used to it -increase to three times daily  Watch out for sedation   We can increase dose once you are adjusted -so call if /when you want to increase it

## 2017-07-16 NOTE — Assessment & Plan Note (Signed)
Mild improvement in facial droop  More sharp/tingly pain in face and ear  Reassuring exam  Recommend ear phones instead of ear plugs at work  [employer] with predisone-can take meloxicam daily with food for pain/inflammation Px gabapentin for pain - 100 mg bid-titrate to tid as tolerated and call if want to inc dose  Info given on medication  F/u April 30 for her PE

## 2017-07-16 NOTE — Assessment & Plan Note (Signed)
Labs for upcoming PE at end of the mo

## 2017-07-17 LAB — CBC WITH DIFFERENTIAL/PLATELET
BASOS: 0 %
Basophils Absolute: 0 10*3/uL (ref 0.0–0.2)
EOS (ABSOLUTE): 0 10*3/uL (ref 0.0–0.4)
EOS: 0 %
HEMATOCRIT: 42.8 % (ref 34.0–46.6)
HEMOGLOBIN: 14.5 g/dL (ref 11.1–15.9)
Immature Grans (Abs): 0 10*3/uL (ref 0.0–0.1)
Immature Granulocytes: 0 %
LYMPHS ABS: 3.8 10*3/uL — AB (ref 0.7–3.1)
Lymphs: 30 %
MCH: 30 pg (ref 26.6–33.0)
MCHC: 33.9 g/dL (ref 31.5–35.7)
MCV: 88 fL (ref 79–97)
MONOS ABS: 0.5 10*3/uL (ref 0.1–0.9)
Monocytes: 4 %
Neutrophils Absolute: 8.3 10*3/uL — ABNORMAL HIGH (ref 1.4–7.0)
Neutrophils: 66 %
Platelets: 327 10*3/uL (ref 150–379)
RBC: 4.84 x10E6/uL (ref 3.77–5.28)
RDW: 13.4 % (ref 12.3–15.4)
WBC: 12.7 10*3/uL — ABNORMAL HIGH (ref 3.4–10.8)

## 2017-07-17 LAB — COMPREHENSIVE METABOLIC PANEL
A/G RATIO: 1.7 (ref 1.2–2.2)
ALBUMIN: 4.5 g/dL (ref 3.5–5.5)
ALK PHOS: 85 IU/L (ref 39–117)
ALT: 15 IU/L (ref 0–32)
AST: 16 IU/L (ref 0–40)
BUN / CREAT RATIO: 16 (ref 9–23)
BUN: 10 mg/dL (ref 6–24)
Bilirubin Total: 0.3 mg/dL (ref 0.0–1.2)
CO2: 25 mmol/L (ref 20–29)
Calcium: 9.7 mg/dL (ref 8.7–10.2)
Chloride: 102 mmol/L (ref 96–106)
Creatinine, Ser: 0.64 mg/dL (ref 0.57–1.00)
GFR calc Af Amer: 126 mL/min/{1.73_m2} (ref >59)
GFR, EST NON AFRICAN AMERICAN: 109 mL/min/{1.73_m2} (ref >59)
GLOBULIN, TOTAL: 2.7 g/dL (ref 1.5–4.5)
Glucose: 97 mg/dL (ref 65–99)
POTASSIUM: 4.8 mmol/L (ref 3.5–5.2)
Sodium: 141 mmol/L (ref 134–144)
Total Protein: 7.2 g/dL (ref 6.0–8.5)

## 2017-07-17 LAB — VITAMIN D 25 HYDROXY (VIT D DEFICIENCY, FRACTURES): VIT D 25 HYDROXY: 50 ng/mL (ref 30.0–100.0)

## 2017-07-17 LAB — LIPID PANEL
CHOL/HDL RATIO: 2.4 ratio (ref 0.0–4.4)
CHOLESTEROL TOTAL: 167 mg/dL (ref 100–199)
HDL: 69 mg/dL (ref >39)
LDL CALC: 78 mg/dL (ref 0–99)
TRIGLYCERIDES: 98 mg/dL (ref 0–149)
VLDL Cholesterol Cal: 20 mg/dL (ref 5–40)

## 2017-07-17 LAB — TSH: TSH: 0.864 u[IU]/mL (ref 0.450–4.500)

## 2017-07-29 ENCOUNTER — Telehealth: Payer: Self-pay | Admitting: Family Medicine

## 2017-07-29 NOTE — Telephone Encounter (Signed)
fmla paperwork and attending physician statement In dr tower in box for review and signature

## 2017-08-02 NOTE — Telephone Encounter (Signed)
Form given back to [employer]

## 2017-08-02 NOTE — Telephone Encounter (Signed)
Paperwork faxed °

## 2017-08-02 NOTE — Telephone Encounter (Signed)
Done and in IN box 

## 2017-08-03 NOTE — Telephone Encounter (Signed)
Paperwork faxed 4/22 Copy for scan Copy for pt

## 2017-08-03 NOTE — Telephone Encounter (Signed)
Left message letting pt know paperwork has been faxed

## 2017-08-10 ENCOUNTER — Encounter: Payer: Self-pay | Admitting: Family Medicine

## 2017-08-10 ENCOUNTER — Ambulatory Visit (INDEPENDENT_AMBULATORY_CARE_PROVIDER_SITE_OTHER): Payer: Managed Care, Other (non HMO) | Admitting: Family Medicine

## 2017-08-10 VITALS — BP 132/80 | HR 71 | Temp 97.8°F | Ht 63.5 in | Wt 238.0 lb

## 2017-08-10 DIAGNOSIS — Z1231 Encounter for screening mammogram for malignant neoplasm of breast: Secondary | ICD-10-CM

## 2017-08-10 DIAGNOSIS — Z23 Encounter for immunization: Secondary | ICD-10-CM | POA: Diagnosis not present

## 2017-08-10 DIAGNOSIS — Z8742 Personal history of other diseases of the female genital tract: Secondary | ICD-10-CM | POA: Diagnosis not present

## 2017-08-10 DIAGNOSIS — G510 Bell's palsy: Secondary | ICD-10-CM

## 2017-08-10 DIAGNOSIS — E559 Vitamin D deficiency, unspecified: Secondary | ICD-10-CM | POA: Diagnosis not present

## 2017-08-10 DIAGNOSIS — Z0000 Encounter for general adult medical examination without abnormal findings: Secondary | ICD-10-CM | POA: Diagnosis not present

## 2017-08-10 NOTE — Progress Notes (Signed)
Subjective:    Patient ID: Kari Rice, female    DOB: June 08, 1975, 42 y.o.   MRN: [id number]  HPI Here for health maintenance exam and to review chronic medical problems    Has had Bells palsy since March  Gradually getting better - can close her eye now /but still feels dry at times  Her spasm in the face is still present but improved (worse when she gets tired)     Wt Readings from Last 3 Encounters:  08/10/17 238 lb (108 kg)  07/16/17 233 lb 8 oz (105.9 kg)  07/07/17 233 lb (105.7 kg)  joining a gym with a Art therapist -excited about that  Also plan is eating out less and eating better (smaller portions/lean protein) - does well with that  41.50 kg/m   Mammogram 2/16- we will schedule it  Self breast exam -no lumps   Has appt with pulmonary doctor next week -for cpap (having some nasal/comfort issues)  Tetanus shot 12/09 Will get that today  Missed flu shot- will get in the fall   Pap 10/17 neg with trich (which she has chronically)-- though test in jan was neg  Has not been sexually active  Had some d/c a week ago - would like re testing with her pap / also repeat testing for trich   Has had some menopausal symptoms  Periods are starting to get irregular    Hx of vit D def  D level is 50.0 -improved  Taking her D every day - does feel better with it in general   On 3rd shift  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) It is tough for her    Cholesterol  Lab Results  Component Value Date   CHOL 167 07/16/2017   CHOL 178 01/14/2016   CHOL 154 05/29/2014   Lab Results  Component Value Date   HDL 69 07/16/2017   HDL 58 01/14/2016   HDL 49 05/29/2014   Lab Results  Component Value Date   LDLCALC 78 07/16/2017   Ollie 99 01/14/2016   LDLCALC 83 05/29/2014   Lab Results  Component Value Date   TRIG 98 07/16/2017   TRIG 104 01/14/2016   TRIG 112 05/29/2014   Lab Results  Component Value Date   CHOLHDL 2.4 07/16/2017   CHOLHDL 3.1 01/14/2016   CHOLHDL 3.1 05/29/2014   No results found for: LDLDIRECT Starting to watch diet - bringing her own food to work  Eating less in general  Less fast food and fatty foods  Picking salmon and veg when eating out Less beef!    Other labs Results for orders placed or performed in visit on 07/16/17  CBC with Differential/Platelet  Result Value Ref Range   WBC 12.7 (H) 3.4 - 10.8 x10E3/uL   RBC 4.84 3.77 - 5.28 x10E6/uL   Hemoglobin 14.5 11.1 - 15.9 g/dL   Hematocrit 42.8 34.0 - 46.6 %   MCV 88 79 - 97 fL   MCH 30.0 26.6 - 33.0 pg   MCHC 33.9 31.5 - 35.7 g/dL   RDW 13.4 12.3 - 15.4 %   Platelets 327 150 - 379 x10E3/uL   Neutrophils 66 Not Estab. %   Lymphs 30 Not Estab. %   Monocytes 4 Not Estab. %   Eos 0 Not Estab. %   Basos 0 Not Estab. %   Neutrophils Absolute 8.3 (H) 1.4 - 7.0 x10E3/uL   Lymphocytes Absolute 3.8 (H)  0.7 - 3.1 x10E3/uL   Monocytes Absolute 0.5 0.1 - 0.9 x10E3/uL   EOS (ABSOLUTE) 0.0 0.0 - 0.4 x10E3/uL   Basophils Absolute 0.0 0.0 - 0.2 x10E3/uL   Immature Granulocytes 0 Not Estab. %   Immature Grans (Abs) 0.0 0.0 - 0.1 x10E3/uL  Comprehensive metabolic panel  Result Value Ref Range   Glucose 97 65 - 99 mg/dL   BUN 10 6 - 24 mg/dL   Creatinine, Ser 0.64 0.57 - 1.00 mg/dL   GFR calc non Af Amer 109 >59 mL/min/1.73   GFR calc Af Amer 126 >59 mL/min/1.73   BUN/Creatinine Ratio 16 9 - 23   Sodium 141 134 - 144 mmol/L   Potassium 4.8 3.5 - 5.2 mmol/L   Chloride 102 96 - 106 mmol/L   CO2 25 20 - 29 mmol/L   Calcium 9.7 8.7 - 10.2 mg/dL   Total Protein 7.2 6.0 - 8.5 g/dL   Albumin 4.5 3.5 - 5.5 g/dL   Globulin, Total 2.7 1.5 - 4.5 g/dL   Albumin/Globulin Ratio 1.7 1.2 - 2.2   Bilirubin Total 0.3 0.0 - 1.2 mg/dL   Alkaline Phosphatase 85 39 - 117 IU/L   AST 16 0 - 40 IU/L   ALT 15 0 - 32 IU/L  Lipid panel  Result Value Ref Range   Cholesterol, Total 167 100 - 199 mg/dL   Triglycerides 98 0 - 149 mg/dL   HDL 69 >39 mg/dL   VLDL  Cholesterol Cal 20 5 - 40 mg/dL   LDL Calculated 78 0 - 99 mg/dL   Chol/HDL Ratio 2.4 0.0 - 4.4 ratio  TSH  Result Value Ref Range   TSH 0.864 0.450 - 4.500 uIU/mL  VITAMIN D 25 Hydroxy (Vit-D Deficiency, Fractures)  Result Value Ref Range   Vit D, 25-Hydroxy 50.0 30.0 - 100.0 ng/mL    Wbc up from prednisone recently    Patient Active Problem List   Diagnosis Date Noted  . H/O vaginitis 08/10/2017  . Bell's palsy 07/07/2017  . Headache 07/07/2017  . Phlebitis and thrombophlebitis of other deep vessels of unspecified lower extremity ([place]) 05/28/2017  . Nasal congestion 05/15/2016  . Hair loss 03/25/2016  . Vitamin D deficiency 01/15/2016  . Routine general medical examination at a health care facility 05/29/2014  . Encounter for routine gynecological examination 05/29/2014  . Screening for HIV (human immunodeficiency virus) 05/29/2014  . Encounter for screening mammogram for breast cancer 05/29/2014  . Morbid obesity ([place]) 05/29/2014  . Joint pain 04/10/2014  . History of DVT (deep vein thrombosis) 07/06/2012  . Varicose veins of lower extremities with other complications 46/27/0350  . Unspecified venous (peripheral) insufficiency 12/23/2011  . Venous insufficiency 11/25/2011  . Phlebitis and thrombophlebitis of the leg 11/06/2011  . Venous insufficiency, peripheral 10/26/2011  . Pedal edema 10/26/2011  . IRREGULAR MENSES 02/29/2008  . 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 DVT (deep venous thrombosis) ([place])   . Varicose veins    Past Surgical History:  Procedure Laterality Date  . CHOLECYSTECTOMY  Feb. 2006   Gall Bladder  . THROMBECTOMY Right 01/13/12 and  01/21/12   Right iliac vein stent and Vein thrombosis   Social History   Tobacco Use  . Smoking status: Never Smoker  . Smokeless tobacco: Never Used  Substance Use Topics  . Alcohol use: No    Alcohol/week: 0.0 oz  . Drug use: No   Family History  Problem Relation  Age of  Onset  . Arthritis Mother   . Cancer Mother   . Heart disease Father        Heart Disease before age 62  . Cancer Father   . Hypertension Father   . Heart attack Father    Allergies  Allergen Reactions  . Penicillins Rash  . Sulfa Antibiotics Rash   Current Outpatient Medications on File Prior to Visit  Medication Sig Dispense Refill  . Cholecalciferol (VITAMIN D3) 2000 units TABS Take 1 tablet by mouth daily.    Marland Kitchen loratadine (CLARITIN) 10 MG tablet Take 10 mg daily by mouth.    . meloxicam (MOBIC) 15 MG tablet Take 1 tablet (15 mg total) by mouth daily. With food 30 tablet 1  . NON FORMULARY Support hose to the waist for dx of venous insufficiency and edema 15-20 mm Hg    . triamcinolone (NASACORT) 55 MCG/ACT AERO nasal inhaler Place 2 sprays into the nose daily.    Marland Kitchen gabapentin (NEURONTIN) 100 MG capsule Take 1 capsule (100 mg total) by mouth 3 (three) times daily. (Patient not taking: Reported on 08/10/2017) 90 capsule 3   No current facility-administered medications on file prior to visit.     Review of Systems  Constitutional: Negative for activity change, appetite change, fatigue, fever and unexpected weight change.  HENT: Negative for congestion, ear pain, rhinorrhea, sinus pressure and sore throat.   Eyes: Negative for pain, redness and visual disturbance.  Respiratory: Negative for cough, shortness of breath and wheezing.   Cardiovascular: Negative for chest pain and palpitations.  Gastrointestinal: Negative for abdominal pain, blood in stool, constipation and diarrhea.  Endocrine: Negative for polydipsia and polyuria.  Genitourinary: Negative for dysuria, frequency and urgency.  Musculoskeletal: Negative for arthralgias, back pain and myalgias.  Skin: Negative for pallor and rash.  Allergic/Immunologic: Negative for environmental allergies.  Neurological: Positive for facial asymmetry and headaches. Negative for dizziness and syncope.       Bell's palsy  continues to improve   Hematological: Negative for adenopathy. Does not bruise/bleed easily.  Psychiatric/Behavioral: Negative for decreased concentration and dysphoric mood. The patient is not nervous/anxious.        Objective:   Physical Exam  Constitutional: She appears well-developed and well-nourished. No distress.  obese and well appearing   HENT:  Head: Normocephalic and atraumatic.  Right Ear: External ear normal.  Left Ear: External ear normal.  Mouth/Throat: Oropharynx is clear and moist.  Eyes: Pupils are equal, round, and reactive to light. Conjunctivae and EOM are normal. No scleral icterus.  Neck: Normal range of motion. Neck supple. No JVD present. Carotid bruit is not present. No thyromegaly present.  Cardiovascular: Normal rate, regular rhythm, normal heart sounds and intact distal pulses. Exam reveals no gallop.  Pulmonary/Chest: Effort normal and breath sounds normal. No respiratory distress. She has no wheezes. She exhibits no tenderness. No breast tenderness, discharge or bleeding.  Abdominal: Soft. Bowel sounds are normal. She exhibits no distension, no abdominal bruit and no mass. There is no tenderness.  Genitourinary: No breast tenderness, discharge or bleeding.  Genitourinary Comments: Breast exam: No mass, nodules, thickening, tenderness, bulging, retraction, inflamation, nipple discharge or skin changes noted.  No axillary or clavicular LA.      Musculoskeletal: Normal range of motion. She exhibits no edema or tenderness.  Lymphadenopathy:    She has no cervical adenopathy.  Neurological: She is alert. She has normal reflexes. She exhibits normal muscle tone. Coordination normal.  Facial droop on L is much  improved  Can close L eye with effort   Skin: Skin is warm and dry. No rash noted. No erythema. No pallor.  Psychiatric: She has a normal mood and affect.  Pleasant           Assessment & Plan:   Problem List Items Addressed This Visit       Nervous and Auditory   Bell's palsy    Continues to improve  occ facial pain /spasm        Other   Encounter for screening mammogram for breast cancer    Scheduled annual screening mammogram Nl breast exam today  Encouraged monthly self exams        Relevant Orders   MM DIGITAL SCREENING BILATERAL   H/O vaginitis    H/o recurrent vaginitis/including trichomonas that did not respond to common tx  Some perimenopausal symptoms Desires gyn ref for this and regular care       Relevant Orders   Ambulatory referral to Gynecology   Morbid obesity ([place])    Discussed how this problem influences overall health and the risks it imposes  Reviewed plan for weight loss with lower calorie diet (via better food choices and also portion control or program like weight watchers) and exercise building up to or more than 30 minutes 5 days per week including some aerobic activity         Routine general medical examination at a health care facility - Primary    Reviewed health habits including diet and exercise and skin cancer prevention Reviewed appropriate screening tests for age  Also reviewed health mt list, fam hx and immunization status , as well as social and family history   See HPI Ref to gyn  Ref for mammogram  Labs rev  Tdap today  Disc need for wt loss         Vitamin D deficiency    Improved Vitamin D level is therapeutic with current supplementation Disc importance of this to bone and overall health        Other Visit Diagnoses    Need for Tdap vaccination       Relevant Orders   Tdap vaccine greater than or equal to 7yo IM (Completed)

## 2017-08-10 NOTE — Patient Instructions (Addendum)
Get back on track with diet and exercise  Try to get most of your carbohydrates from produce (with the exception of white potatoes)  Eat less bread/pasta/rice/snack foods/cereals/sweets and other items from the middle of the grocery store (processed carbs)   We will refer you for mammogram   We will also refer you to gyn   So glad the Bell's Palsy is improving

## 2017-08-11 ENCOUNTER — Ambulatory Visit: Payer: Managed Care, Other (non HMO) | Admitting: Obstetrics and Gynecology

## 2017-08-11 ENCOUNTER — Encounter: Payer: Self-pay | Admitting: Obstetrics and Gynecology

## 2017-08-11 VITALS — BP 132/90 | HR 83 | Ht 64.0 in | Wt 240.0 lb

## 2017-08-11 DIAGNOSIS — N898 Other specified noninflammatory disorders of vagina: Secondary | ICD-10-CM

## 2017-08-11 DIAGNOSIS — N951 Menopausal and female climacteric states: Secondary | ICD-10-CM | POA: Diagnosis not present

## 2017-08-11 DIAGNOSIS — Z8619 Personal history of other infectious and parasitic diseases: Secondary | ICD-10-CM

## 2017-08-11 LAB — POCT WET PREP WITH KOH
CLUE CELLS WET PREP PER HPF POC: NEGATIVE
KOH Prep POC: NEGATIVE
TRICHOMONAS UA: NEGATIVE
YEAST WET PREP PER HPF POC: NEGATIVE

## 2017-08-11 NOTE — Assessment & Plan Note (Signed)
Improved Vitamin D level is therapeutic with current supplementation Disc importance of this to bone and overall health

## 2017-08-11 NOTE — Assessment & Plan Note (Signed)
Reviewed health habits including diet and exercise and skin cancer prevention Reviewed appropriate screening tests for age  Also reviewed health mt list, fam hx and immunization status , as well as social and family history   See HPI Ref to gyn  Ref for mammogram  Labs rev  Tdap today  Disc need for wt loss

## 2017-08-11 NOTE — Assessment & Plan Note (Signed)
Scheduled annual screening mammogram Nl breast exam today  Encouraged monthly self exams   

## 2017-08-11 NOTE — Assessment & Plan Note (Signed)
Discussed how this problem influences overall health and the risks it imposes  Reviewed plan for weight loss with lower calorie diet (via better food choices and also portion control or program like weight watchers) and exercise building up to or more than 30 minutes 5 days per week including some aerobic activity    

## 2017-08-11 NOTE — Assessment & Plan Note (Signed)
Continues to improve  occ facial pain Kari Rice

## 2017-08-11 NOTE — Progress Notes (Signed)
Tower, Wynelle Fanny, MD   Chief Complaint  Patient presents with  . Vaginitis    H/o trich in past, follow up from yeast infection     HPI:      [name] is a 42 y.o. No obstetric history on file. who LMP was Patient's last menstrual period was 07/16/2017 (exact date)., presents today for NP ref by PCP for recurrent trich, per pt request. Hx of trich on pap 2/16 and 10/17, but had neg trich test 05/11/17. Pt is not sex active. Pt concerned about recurrent dx on pap, but not having sx.   She was diagnosed with yeast 1/19 with PCP and treated with diflucan with sx relief. Pt had increased d/c without odor/irritation last wk, and no meds to treat. Thought it was recurrent yeast but sx have resolved.   Pt also notes occas night sweats. Menses are monthly and pt denies any DUB sx. No pelvic pain.   Paps/annuals with PCP.   Past Medical History:  Diagnosis Date  . DVT (deep venous thrombosis) ([place])   . Varicose veins     Past Surgical History:  Procedure Laterality Date  . CHOLECYSTECTOMY  Feb. 2006   Gall Bladder  . HAND SURGERY  2016  . THROMBECTOMY Right 01/13/12 and  01/21/12   Right iliac vein stent and Vein thrombosis    Family History  Problem Relation Age of Onset  . Arthritis Mother   . Cancer Mother        uterine   . Heart disease Father        Heart Disease before age 57  . Cancer Father        skin   . Hypertension Father   . Heart attack Father     Social History   Socioeconomic History  . Marital status: Divorced    Spouse name: Not on file  . Number of children: Not on file  . Years of education: Not on file  . Highest education level: Not on file  Occupational History  . Not on file  Social Needs  . Financial resource strain: Not on file  . Food insecurity:    Worry: Not on file    Inability: Not on file  . Transportation needs:    Medical: Not on file    Non-medical: Not on file  Tobacco Use  . Smoking status: Never Smoker  .  Smokeless tobacco: Never Used  Substance and Sexual Activity  . Alcohol use: No    Alcohol/week: 0.0 oz  . Drug use: No  . Sexual activity: Not Currently    Birth control/protection: None  Lifestyle  . Physical activity:    Days per week: Not on file    Minutes per session: Not on file  . Stress: Not on file  Relationships  . Social connections:    Talks on phone: Not on file    Gets together: Not on file    Attends religious service: Not on file    Active member of club or organization: Not on file    Attends meetings of clubs or organizations: Not on file    Relationship status: Not on file  . Intimate partner violence:    Fear of current or ex partner: Not on file    Emotionally abused: Not on file    Physically abused: Not on file    Forced sexual activity: Not on file  Other Topics Concern  . Not on file  Social History Narrative  . Not on file    Outpatient Medications Prior to Visit  Medication Sig Dispense Refill  . Cholecalciferol (VITAMIN D3) 2000 units TABS Take 1 tablet by mouth daily.    Marland Kitchen gabapentin (NEURONTIN) 100 MG capsule Take 1 capsule (100 mg total) by mouth 3 (three) times daily. 90 capsule 3  . loratadine (CLARITIN) 10 MG tablet Take 10 mg daily by mouth.    . meloxicam (MOBIC) 15 MG tablet Take 1 tablet (15 mg total) by mouth daily. With food 30 tablet 1  . triamcinolone (NASACORT) 55 MCG/ACT AERO nasal inhaler Place 2 sprays into the nose daily.    . NON FORMULARY Support hose to the waist for dx of venous insufficiency and edema 15-20 mm Hg     No facility-administered medications prior to visit.     ROS:  Review of Systems  Constitutional: Negative for fatigue, fever and unexpected weight change.  Respiratory: Negative for cough, shortness of breath and wheezing.   Cardiovascular: Negative for chest pain, palpitations and leg swelling.  Gastrointestinal: Negative for blood in stool, constipation, diarrhea, nausea and vomiting.  Endocrine:  Negative for cold intolerance, heat intolerance and polyuria.  Genitourinary: Positive for vaginal discharge. Negative for dyspareunia, dysuria, flank pain, frequency, genital sores, hematuria, menstrual problem, pelvic pain, urgency, vaginal bleeding and vaginal pain.  Musculoskeletal: Negative for back pain, joint swelling and myalgias.  Skin: Negative for rash.  Neurological: Negative for dizziness, syncope, light-headedness, numbness and headaches.  Hematological: Negative for adenopathy.  Psychiatric/Behavioral: Negative for agitation, confusion, sleep disturbance and suicidal ideas. The patient is not nervous/anxious.     OBJECTIVE:   Vitals:  BP 132/90   Pulse 83   Ht 5\' 4"  (1.626 m)   Wt 240 lb (108.9 kg)   LMP 07/16/2017 (Exact Date)   BMI 41.20 kg/m   Physical Exam  Constitutional: She is oriented to person, place, and time. Vital signs are normal. She appears well-developed.  Pulmonary/Chest: Effort normal.  Genitourinary: Vagina normal and uterus normal. There is no rash, tenderness or lesion on the right labia. There is no rash, tenderness or lesion on the left labia. Uterus is not enlarged and not tender. Cervix exhibits no motion tenderness. Right adnexum displays no mass and no tenderness. Left adnexum displays no mass and no tenderness. No erythema or tenderness in the vagina. No vaginal discharge found.  Musculoskeletal: Normal range of motion.  Neurological: She is alert and oriented to person, place, and time.  Psychiatric: She has a normal mood and affect. Her behavior is normal. Thought content normal.  Vitals reviewed.   Results: Results for orders placed or performed in visit on 08/11/17 (from the past 24 hour(s))  POCT Wet Prep with KOH     Status: Normal   Collection Time: 08/11/17 11:22 AM  Result Value Ref Range   Trichomonas, UA Negative    Clue Cells Wet Prep HPF POC neg    Epithelial Wet Prep HPF POC  Few, Moderate, Many, Too numerous to count    Yeast Wet Prep HPF POC neg    Bacteria Wet Prep HPF POC  Few   RBC Wet Prep HPF POC     WBC Wet Prep HPF POC     KOH Prep POC Negative Negative     Assessment/Plan: Vaginal discharge - NEg wet prep, sx improved anyway. Reassurance. F/u prn.  - Plan: POCT Wet Prep with KOH  H/O trichomoniasis - On pap smear only. Discussed false positive  rate of trich on pap. Had neg trich test 1/19. Neg wet prep today. Reassurance. F/u prn.  Perimenopausal vasomotor symptoms - Discussed perimenopause and sx. F/u prn DUB.    Return if symptoms worsen or fail to improve.  Krissy Orebaugh B. Shakya Sebring, PA-C 08/11/2017 12:05 PM

## 2017-08-11 NOTE — Patient Instructions (Signed)
I value your feedback and entrusting us with your care. If you get a [facility] patient survey, I would appreciate you taking the time to let us know about your experience today. Thank you! 

## 2017-08-11 NOTE — Assessment & Plan Note (Signed)
H/o recurrent vaginitis/including trichomonas that did not respond to common tx  Some perimenopausal symptoms Desires gyn ref for this and regular care

## 2017-08-17 ENCOUNTER — Encounter: Payer: Self-pay | Admitting: Internal Medicine

## 2017-08-17 ENCOUNTER — Ambulatory Visit: Payer: Managed Care, Other (non HMO) | Admitting: Internal Medicine

## 2017-08-17 VITALS — BP 144/82 | HR 85 | Ht 64.0 in | Wt 241.0 lb

## 2017-08-17 DIAGNOSIS — G4733 Obstructive sleep apnea (adult) (pediatric): Secondary | ICD-10-CM

## 2017-08-17 DIAGNOSIS — J3089 Other allergic rhinitis: Secondary | ICD-10-CM

## 2017-08-17 MED ORDER — PANTOPRAZOLE SODIUM 20 MG PO TBEC: 20.0000 mg | DELAYED_RELEASE_TABLET | Freq: Every day | ORAL | 1 refills | Status: DC

## 2017-08-17 NOTE — Addendum Note (Signed)
Addended by: Stephanie Coup on: 08/17/2017 10:32 AM   Modules accepted: Orders

## 2017-08-17 NOTE — Patient Instructions (Signed)
ENT referral for allergic Rhinitis Start PRotonix therapy for acid reflux

## 2017-08-17 NOTE — Progress Notes (Signed)
Kari Rice      Date: 08/17/2017,   MRN# [id number] Kari Rice 08-Feb-1976     AdmissionWeight: 241 lb (109.3 kg)                 CurrentWeight: 241 lb (109.3 kg) Kari Rice is a 42 y.o. old female seen in [place] for problems with sleeping at the request of DR Mertha Baars  Previously seen for DX of OSA AHI 12.5 Last seen 12/17   CHIEF COMPLAINT:  Follow up with Dx of OSA    HISTORY OF PRESENT ILLNESS  42 yo pleasant white female seen today for OSA Sleep study confirms OSA AHI 12.5/hr She has no issues except dry eyes +allerghic rhinitis with nasal congestion despite NAsocort and saline washes and Claritan therapy which helps but still has presistent symtpoms  Her compliance report shows 100 compliance >4hrs and daily usage AUTOCPAP 5-15 With AHI 0.8  No signs of infection at this time    She has no acute resp issues or cardiac issues at this time  She works as Corporate investment banker at [employer] She is single, nonsmoker, exposed to second hand smoke Previous DVT in 2013 unknown reason Father with COPD on oxygen thetrapy   +GERD symptoms for last 6 months     Current Medication:  Current Outpatient Medications:  .  Cholecalciferol (VITAMIN D3) 2000 units TABS, Take 1 tablet by mouth daily., Disp: , Rfl:  .  gabapentin (NEURONTIN) 100 MG capsule, Take 1 capsule (100 mg total) by mouth 3 (three) times daily., Disp: 90 capsule, Rfl: 3 .  loratadine (CLARITIN) 10 MG tablet, Take 10 mg daily by mouth., Disp: , Rfl:  .  meloxicam (MOBIC) 15 MG tablet, Take 1 tablet (15 mg total) by mouth daily. With food, Disp: 30 tablet, Rfl: 1 .  NON FORMULARY, Support hose to the waist for dx of venous insufficiency and edema 15-20 mm Hg, Disp: , Rfl:  .  triamcinolone (NASACORT) 55 MCG/ACT AERO nasal inhaler, Place 2 sprays into the nose daily., Disp: , Rfl:     ALLERGIES   Penicillins and Sulfa antibiotics     REVIEW OF  SYSTEMS   Review of Systems  Constitutional: Negative for chills, diaphoresis, fever, malaise/fatigue and weight loss.  HENT: Positive for congestion. Negative for hearing loss.   Eyes: Negative for blurred vision and double vision.  Respiratory: Negative for cough, hemoptysis, sputum production, shortness of breath and wheezing.   Cardiovascular: Negative for chest pain, palpitations and orthopnea.  Gastrointestinal: Negative for abdominal pain, heartburn, nausea and vomiting.  Skin: Negative for rash.  Neurological: Negative for weakness.  All other systems reviewed and are negative.    VS: Ht 5\' 4"  (1.626 m)   Wt 241 lb (109.3 kg)   BMI 41.37 kg/m    BP (!) 144/82 (BP Location: Left Arm, Cuff Size: Normal)   Pulse 85   Ht 5\' 4"  (1.626 m)   Wt 241 lb (109.3 kg)   SpO2 99%   BMI 41.37 kg/m    PHYSICAL EXAM  Physical Exam  Constitutional: She is oriented to person, place, and time. She appears well-developed and well-nourished. No distress.  HENT:  Mouth/Throat: No oropharyngeal exudate.  Eyes: Pupils are equal, round, and reactive to light.  Neck: Neck supple.  Cardiovascular: Normal rate, regular rhythm and normal heart sounds.  No murmur heard. Pulmonary/Chest: Effort normal and breath sounds normal. No stridor. No respiratory distress. She has no  wheezes. She has no rales.  Musculoskeletal: Normal range of motion. She exhibits no edema.  Neurological: She is alert and oriented to person, place, and time. No cranial nerve deficit.  Skin: Skin is warm. She is not diaphoretic.  Psychiatric: She has a normal mood and affect.          ASSESSMENT/PLAN   42 yo pleasant overweight female with dx of Mild -Moderate Sleep apnea with AHI 12.5 nwo controlled with AHI down to 0.8 on AutoCPAP therapy in setting of allerhic rhinitis despite aggressive therapy Patient will need ENT referral for further assessment  Overall, doing well with therapy, fatigue has improved. Patient  uses and benefits from autoCPAP therapy  1.continue AUTOCPAP therapy as prescribed 2.recommmend diet modification,exercise and weight loss 3.referal to ENT 4.start PPI for GERD    Follow up in 6 months   Patient  satisfied with Plan of action and management. All questions answered  Corrin Parker, M.D.  [facility]  Medical Director [place] [facility] Cardio-Pulmonary Department

## 2017-08-23 ENCOUNTER — Encounter: Payer: Self-pay | Admitting: Family Medicine

## 2017-09-03 ENCOUNTER — Ambulatory Visit
Admission: RE | Admit: 2017-09-03 | Discharge: 2017-09-03 | Disposition: A | Discharge status: Home / Self Care | Payer: Managed Care, Other (non HMO) | Source: Ambulatory Visit | Attending: Family Medicine | Admitting: Family Medicine

## 2017-09-03 DIAGNOSIS — Z1231 Encounter for screening mammogram for malignant neoplasm of breast: Secondary | ICD-10-CM

## 2017-09-23 ENCOUNTER — Encounter: Payer: Self-pay | Admitting: Internal Medicine

## 2017-09-23 ENCOUNTER — Encounter: Payer: Self-pay | Admitting: Family Medicine

## 2017-09-28 ENCOUNTER — Other Ambulatory Visit: Payer: Self-pay

## 2017-09-28 ENCOUNTER — Encounter: Payer: Self-pay | Admitting: *Deleted

## 2017-09-29 NOTE — Discharge Instructions (Signed)
[facility] °[facility] °ENDOSCOPIC SINUS SURGERY °[facility] ° °What is Functional Endoscopic Sinus Surgery? ° The Surgery involves making the natural openings of the sinuses larger by removing the bony partitions that separate the sinuses from the nasal cavity.  The natural sinus lining is preserved as much as possible to allow the sinuses to resume normal function after the surgery.  In some patients nasal polyps (excessively swollen lining of the sinuses) may be removed to relieve obstruction of the sinus openings.  The surgery is performed through the nose using lighted scopes, which eliminates the need for incisions on the face.  A septoplasty is a different procedure which is sometimes performed with sinus surgery.  It involves straightening the boy partition that separates the two sides of your nose.  A crooked or deviated septum may need repair if is obstructing the sinuses or nasal airflow.  Turbinate reduction is also often performed during sinus surgery.  The turbinates are bony proturberances from the side walls of the nose which swell and can obstruct the nose in patients with sinus and allergy problems.  Their size can be surgically reduced to help relieve nasal obstruction. ° °What Can Sinus Surgery Do For Me? ° Sinus surgery can reduce the frequency of sinus infections requiring antibiotic treatment.  This can provide improvement in nasal congestion, post-nasal drainage, facial pressure and nasal obstruction.  Surgery will NOT prevent you from ever having an infection again, so it usually only for patients who get infections 4 or more times yearly requiring antibiotics, or for infections that do not clear with antibiotics.  It will not cure nasal allergies, so patients with allergies may still require medication to treat their allergies after surgery. Surgery may improve headaches related to sinusitis, however, some people will continue to  require medication to control sinus headaches related to allergies.  Surgery will do nothing for other forms of headache (migraine, tension or cluster). ° °What Are the Risks of Endoscopic Sinus Surgery? ° Current techniques allow surgery to be performed safely with little risk, however, there are rare complications that patients should be aware of.  Because the sinuses are located around the eyes, there is risk of eye injury, including blindness, though again, this would be quite rare. This is usually a result of bleeding behind the eye during surgery, which puts the vision oat risk, though there are treatments to protect the vision and prevent permanent disrupted by surgery causing a leak of the spinal fluid that surrounds the brain.  More serious complications would include bleeding inside the brain cavity or damage to the brain.  Again, all of these complications are uncommon, and spinal fluid leaks can be safely managed surgically if they occur.  The most common complication of sinus surgery is bleeding from the nose, which may require packing or cauterization of the nose.  Continued sinus have polyps may experience recurrence of the polyps requiring revision surgery.  Alterations of sense of smell or injury to the tear ducts are also rare complications.  ° °What is the Surgery Like, and what is the Recovery? ° The Surgery usually takes a couple of hours to perform, and is usually performed under a general anesthetic (completely asleep).  Patients are usually discharged home after a couple of hours.  Sometimes during surgery it is necessary to pack the nose to control bleeding, and the packing is left in place for 24 - 48 hours, and removed by your surgeon.    If a septoplasty was performed during the procedure, there is often a splint placed which must be removed after 5-7 days.   °Discomfort: Pain is usually mild to moderate, and can be controlled by prescription pain medication or acetaminophen (Tylenol).   Aspirin, Ibuprofen (Advil, Motrin), or Naprosyn (Aleve) should be avoided, as they can cause increased bleeding.  Most patients feel sinus pressure like they have a bad head cold for several days.  Sleeping with your head elevated can help reduce swelling and facial pressure, as can ice packs over the face.  A humidifier may be helpful to keep the mucous and blood from drying in the nose.  ° °Diet: There are no specific diet restrictions, however, you should generally start with clear liquids and a light diet of bland foods because the anesthetic can cause some nausea.  Advance your diet depending on how your stomach feels.  Taking your pain medication with food will often help reduce stomach upset which pain medications can cause. ° °Nasal Saline Irrigation: It is important to remove blood clots and dried mucous from the nose as it is healing.  This is done by having you irrigate the nose at least 3 - 4 times daily with a salt water solution.  We recommend using NeilMed Sinus Rinse (available at the drug store).  Fill the squeeze bottle with the solution, bend over a sink, and insert the tip of the squeeze bottle into the nose ½ of an inch.  Point the tip of the squeeze bottle towards the inside corner of the eye on the same side your irrigating.  Squeeze the bottle and gently irrigate the nose.  If you bend forward as you do this, most of the fluid will flow back out of the nose, instead of down your throat.   The solution should be warm, near body temperature, when you irrigate.   Each time you irrigate, you should use a full squeeze bottle.  ° °Note that if you are instructed to use Nasal Steroid Sprays at any time after your surgery, irrigate with saline BEFORE using the steroid spray, so you do not wash it all out of the nose. °Another product, Nasal Saline Gel (such as AYR Nasal Saline Gel) can be applied in each nostril 3 - 4 times daily to moisture the nose and reduce scabbing or crusting. ° °Bleeding:   Bloody drainage from the nose can be expected for several days, and patients are instructed to irrigate their nose frequently with salt water to help remove mucous and blood clots.  The drainage may be dark red or brown, though some fresh blood may be seen intermittently, especially after irrigation.  Do not blow you nose, as bleeding may occur. If you must sneeze, keep your mouth open to allow air to escape through your mouth. ° °If heavy bleeding occurs: Irrigate the nose with saline to rinse out clots, then spray the nose 3 - 4 times with Afrin Nasal Decongestant Spray.  The spray will constrict the blood vessels to slow bleeding.  Pinch the lower half of your nose shut to apply pressure, and lay down with your head elevated.  Ice packs over the nose may help as well. If bleeding persists despite these measures, you should notify your doctor.  Do not use the Afrin routinely to control nasal congestion after surgery, as it can result in worsening congestion and may affect healing.  ° ° ° °Activity: Return to work varies among patients. Most patients will be   out of work at least 5 - 7 days to recover.  Patient may return to work after they are off of narcotic pain medication, and feeling well enough to perform the functions of their job.  Patients must avoid heavy lifting (over 10 pounds) or strenuous physical for 2 weeks after surgery, so your employer may need to assign you to light duty, or keep you out of work longer if light duty is not possible.  NOTE: you should not drive, operate dangerous machinery, do any mentally demanding tasks or make any important legal or financial decisions while on narcotic pain medication and recovering from the general anesthetic.  °  °Call Your Doctor Immediately if You Have Any of the Following: °1. Bleeding that you cannot control with the above measures °2. Loss of vision, double vision, bulging of the eye or black eyes. °3. Fever over 101 degrees °4. Neck stiffness with  severe headache, fever, nausea and change in mental state. °You are always encourage to call anytime with concerns, however, please call with requests for pain medication refills during office hours. ° °Office Endoscopy: During follow-up visits your doctor will remove any packing or splints that may have been placed and evaluate and clean your sinuses endoscopically.  Topical anesthetic will be used to make this as comfortable as possible, though you may want to take your pain medication prior to the visit.  How often this will need to be done varies from patient to patient.  After complete recovery from the surgery, you may need follow-up endoscopy from time to time, particularly if there is concern of recurrent infection or nasal polyps. ° ° °General Anesthesia, Adult, Care After °These instructions provide you with information about caring for yourself after your procedure. Your health care provider may also give you more specific instructions. Your treatment has been planned according to current medical practices, but problems sometimes occur. Call your health care provider if you have any problems or questions after your procedure. °What can I expect after the procedure? °After the procedure, it is common to have: °· Vomiting. °· A sore throat. °· Mental slowness. ° °It is common to feel: °· Nauseous. °· Cold or shivery. °· Sleepy. °· Tired. °· Sore or achy, even in parts of your body where you did not have surgery. ° °Follow these instructions at home: °For at least 24 hours after the procedure: °· Do not: °? Participate in activities where you could fall or become injured. °? Drive. °? Use heavy machinery. °? Drink alcohol. °? Take sleeping pills or medicines that cause drowsiness. °? Make important decisions or sign legal documents. °? Take care of children on your own. °· Rest. °Eating and drinking °· If you vomit, drink water, juice, or soup when you can drink without vomiting. °· Drink enough fluid to  keep your urine clear or pale yellow. °· Make sure you have little or no nausea before eating solid foods. °· Follow the diet recommended by your health care provider. °General instructions °· Have a responsible adult stay with you until you are awake and alert. °· Return to your normal activities as told by your health care provider. Ask your health care provider what activities are safe for you. °· Take over-the-counter and prescription medicines only as told by your health care provider. °· If you smoke, do not smoke without supervision. °· Keep all follow-up visits as told by your health care provider. This is important. °Contact a health care provider if: °· You   continue to have nausea or vomiting at home, and medicines are not helpful. °· You cannot drink fluids or start eating again. °· You cannot urinate after 8-12 hours. °· You develop a skin rash. °· You have fever. °· You have increasing redness at the site of your procedure. °Get help right away if: °· You have difficulty breathing. °· You have chest pain. °· You have unexpected bleeding. °· You feel that you are having a life-threatening or urgent problem. °This information is not intended to replace advice given to you by your health care provider. Make sure you discuss any questions you have with your health care provider. °Document Released: 07/06/2000 Document Revised: 09/02/2015 Document Reviewed: 03/14/2015 °Elsevier Interactive Patient Education © 2018 [employer] ° °

## 2017-10-01 ENCOUNTER — Ambulatory Visit: Payer: Managed Care, Other (non HMO) | Admitting: Anesthesiology

## 2017-10-01 ENCOUNTER — Encounter
Admission: RE | Disposition: A | Discharge status: Home / Self Care | Payer: Self-pay | Source: Ambulatory Visit | Attending: Unknown Physician Specialty

## 2017-10-01 ENCOUNTER — Ambulatory Visit
Admission: RE | Admit: 2017-10-01 | Discharge: 2017-10-01 | Disposition: A | Discharge status: Home / Self Care | Payer: Managed Care, Other (non HMO) | Source: Ambulatory Visit | Attending: Unknown Physician Specialty | Admitting: Unknown Physician Specialty

## 2017-10-01 DIAGNOSIS — Z86718 Personal history of other venous thrombosis and embolism: Secondary | ICD-10-CM | POA: Insufficient documentation

## 2017-10-01 DIAGNOSIS — M1990 Unspecified osteoarthritis, unspecified site: Secondary | ICD-10-CM | POA: Insufficient documentation

## 2017-10-01 DIAGNOSIS — Z880 Allergy status to penicillin: Secondary | ICD-10-CM | POA: Diagnosis not present

## 2017-10-01 DIAGNOSIS — Z79899 Other long term (current) drug therapy: Secondary | ICD-10-CM | POA: Insufficient documentation

## 2017-10-01 DIAGNOSIS — J343 Hypertrophy of nasal turbinates: Secondary | ICD-10-CM | POA: Diagnosis not present

## 2017-10-01 DIAGNOSIS — J342 Deviated nasal septum: Secondary | ICD-10-CM | POA: Diagnosis not present

## 2017-10-01 DIAGNOSIS — G4730 Sleep apnea, unspecified: Secondary | ICD-10-CM | POA: Insufficient documentation

## 2017-10-01 DIAGNOSIS — Z7951 Long term (current) use of inhaled steroids: Secondary | ICD-10-CM | POA: Insufficient documentation

## 2017-10-01 DIAGNOSIS — J3489 Other specified disorders of nose and nasal sinuses: Secondary | ICD-10-CM | POA: Diagnosis present

## 2017-10-01 HISTORY — DX: Sleep apnea, unspecified: G47.30

## 2017-10-01 HISTORY — DX: Nausea with vomiting, unspecified: R11.2

## 2017-10-01 HISTORY — DX: Nausea with vomiting, unspecified: Z98.890

## 2017-10-01 HISTORY — PX: NASAL SEPTOPLASTY W/ TURBINOPLASTY: SHX2070

## 2017-10-01 HISTORY — DX: Unspecified osteoarthritis, unspecified site: M19.90

## 2017-10-01 SURGERY — SEPTOPLASTY, NOSE, WITH NASAL TURBINATE REDUCTION
Anesthesia: General | Site: Nose | Laterality: Bilateral | Wound class: Clean Contaminated

## 2017-10-01 MED ORDER — OXYCODONE-ACETAMINOPHEN 10-325 MG PO TABS: 1.0000 | ORAL_TABLET | Freq: Four times a day (QID) | ORAL | 0 refills | Status: DC | PRN

## 2017-10-01 MED ORDER — FENTANYL CITRATE (PF) 100 MCG/2ML IJ SOLN
INTRAMUSCULAR | Status: DC | PRN
  Administered 2017-10-01: 100 ug via INTRAVENOUS

## 2017-10-01 MED ORDER — PHENYLEPHRINE HCL 0.5 % NA SOLN
NASAL | Status: DC | PRN
  Administered 2017-10-01: 30 mL

## 2017-10-01 MED ORDER — GLYCOPYRROLATE 0.2 MG/ML IJ SOLN
INTRAMUSCULAR | Status: DC | PRN
  Administered 2017-10-01: 0.2 mg via INTRAVENOUS
  Administered 2017-10-01: 0.1 mg via INTRAVENOUS

## 2017-10-01 MED ORDER — OXYCODONE HCL 5 MG/5ML PO SOLN: 5.0000 mg | Freq: Once | ORAL | Status: AC | PRN

## 2017-10-01 MED ORDER — ONDANSETRON HCL 4 MG/2ML IJ SOLN
INTRAMUSCULAR | Status: DC | PRN
  Administered 2017-10-01: 4 mg via INTRAVENOUS

## 2017-10-01 MED ORDER — OXYCODONE HCL 5 MG PO TABS
5.0000 mg | ORAL_TABLET | Freq: Once | ORAL | Status: AC | PRN
  Administered 2017-10-01: 5 mg via ORAL

## 2017-10-01 MED ORDER — DEXAMETHASONE SODIUM PHOSPHATE 4 MG/ML IJ SOLN
INTRAMUSCULAR | Status: DC | PRN
  Administered 2017-10-01: 10 mg via INTRAVENOUS

## 2017-10-01 MED ORDER — LACTATED RINGERS IV SOLN
INTRAVENOUS | Status: DC
  Administered 2017-10-01: 12:00:00 via INTRAVENOUS

## 2017-10-01 MED ORDER — LIDOCAINE HCL (CARDIAC) PF 100 MG/5ML IV SOSY
PREFILLED_SYRINGE | INTRAVENOUS | Status: DC | PRN
  Administered 2017-10-01: 40 mg via INTRAVENOUS

## 2017-10-01 MED ORDER — MIDAZOLAM HCL 5 MG/5ML IJ SOLN
INTRAMUSCULAR | Status: DC | PRN
  Administered 2017-10-01: 2 mg via INTRAVENOUS

## 2017-10-01 MED ORDER — ACETAMINOPHEN 10 MG/ML IV SOLN
1000.0000 mg | Freq: Once | INTRAVENOUS | Status: AC
  Administered 2017-10-01: 1000 mg via INTRAVENOUS

## 2017-10-01 MED ORDER — PROPOFOL 10 MG/ML IV BOLUS
INTRAVENOUS | Status: DC | PRN
  Administered 2017-10-01: 140 mg via INTRAVENOUS

## 2017-10-01 MED ORDER — CLINDAMYCIN HCL 300 MG PO CAPS: 300.0000 mg | ORAL_CAPSULE | Freq: Three times a day (TID) | ORAL | 0 refills | Status: DC

## 2017-10-01 MED ORDER — OXYMETAZOLINE HCL 0.05 % NA SOLN
6.0000 | Freq: Once | NASAL | Status: AC
  Administered 2017-10-01: 6 via NASAL

## 2017-10-01 MED ORDER — LIDOCAINE-EPINEPHRINE 1 %-1:100000 IJ SOLN
INTRAMUSCULAR | Status: DC | PRN
  Administered 2017-10-01: 10 mL

## 2017-10-01 MED ORDER — LACTATED RINGERS IV SOLN
1000.0000 mL | INTRAVENOUS | Status: DC
  Administered 2017-10-01: 1000 mL via INTRAVENOUS

## 2017-10-01 MED ORDER — SUCCINYLCHOLINE CHLORIDE 20 MG/ML IJ SOLN
INTRAMUSCULAR | Status: DC | PRN
  Administered 2017-10-01: 80 mg via INTRAVENOUS

## 2017-10-01 MED ORDER — SCOPOLAMINE 1 MG/3DAYS TD PT72
1.0000 | MEDICATED_PATCH | TRANSDERMAL | Status: DC
  Administered 2017-10-01: 1.5 mg via TRANSDERMAL

## 2017-10-01 SURGICAL SUPPLY — 27 items
COAG SUCT 10F 3.5MM HAND CTRL (MISCELLANEOUS) ×3 IMPLANT
DRAPE HEAD BAR (DRAPES) ×3 IMPLANT
DRESSING NASL FOAM PST OP SINU (MISCELLANEOUS) IMPLANT
DRSG NASAL FOAM POST OP SINU (MISCELLANEOUS)
ELECT REM PT RETURN 9FT ADLT (ELECTROSURGICAL) ×3
ELECTRODE REM PT RTRN 9FT ADLT (ELECTROSURGICAL) ×1 IMPLANT
GLOVE BIO SURGEON STRL SZ7.5 (GLOVE) ×6 IMPLANT
HANDLE YANKAUER SUCT BULB TIP (MISCELLANEOUS) ×3 IMPLANT
KIT TURNOVER KIT A (KITS) ×3 IMPLANT
NDL HYPO 25GX1X1/2 BEV (NEEDLE) ×1 IMPLANT
NEEDLE HYPO 25GX1X1/2 BEV (NEEDLE) ×3 IMPLANT
PACK DRAPE NASAL/ENT (PACKS) ×3 IMPLANT
SPLINT NASAL SEPTAL BLV .25 LG (MISCELLANEOUS) IMPLANT
SPLINT NASAL SEPTAL BLV .50 ST (MISCELLANEOUS) ×2 IMPLANT
SPONGE NEURO XRAY DETECT 1X3 (DISPOSABLE) ×3 IMPLANT
STRAP BODY AND KNEE 60X3 (MISCELLANEOUS) ×3 IMPLANT
SUT CHROMIC 3-0 (SUTURE) ×3
SUT CHROMIC 3-0 KS 27XMFL CR (SUTURE) ×1
SUT CHROMIC 5-0 (SUTURE)
SUT CHROMIC 5-0 P2 18XMFL CR (SUTURE)
SUT ETHILON 3-0 KS 30 BLK (SUTURE) ×3 IMPLANT
SUT PLAIN GUT 4-0 (SUTURE) IMPLANT
SUTURE CHRMC 3-0 KS 27XMFL CR (SUTURE) ×1 IMPLANT
SUTURE CHRMC 5-0 P2 18XMF CR (SUTURE) IMPLANT
SYR 10ML LL (SYRINGE) ×3 IMPLANT
TOWEL OR 17X26 4PK STRL BLUE (TOWEL DISPOSABLE) ×3 IMPLANT
WATER STERILE IRR 250ML POUR (IV SOLUTION) ×3 IMPLANT

## 2017-10-01 NOTE — Anesthesia Procedure Notes (Signed)
Procedure Name: Intubation Date/Time: 10/01/2017 11:52 AM Performed by: Mayme Genta, CRNA Pre-anesthesia Checklist: Patient identified, Emergency Drugs available, Suction available, Patient being monitored and Timeout performed Patient Re-evaluated:Patient Re-evaluated prior to induction Oxygen Delivery Method: Circle system utilized Preoxygenation: Pre-oxygenation with 100% oxygen Induction Type: IV induction Ventilation: Mask ventilation without difficulty Laryngoscope Size: Miller and 2 Grade View: Grade I Tube type: Oral Rae Tube size: 7.0 mm Number of attempts: 1 Placement Confirmation: ETT inserted through vocal cords under direct vision,  positive ETCO2 and breath sounds checked- equal and bilateral Tube secured with: Tape Dental Injury: Teeth and Oropharynx as per pre-operative assessment

## 2017-10-01 NOTE — Op Note (Signed)
PREOPERATIVE DIAGNOSIS:  Chronic nasal obstruction.  POSTOPERATIVE DIAGNOSIS:  Chronic nasal obstruction.  SURGEON:  Roena Malady, M.D.  NAME OF PROCEDURE:  1. Nasal septoplasty. 2. Submucous resection of inferior turbinates.  OPERATIVE FINDINGS:  Severe nasal septal deformity, hypertrophy of the inferior turbinates.   DESCRIPTION OF THE PROCEDURE:  Kari Rice was identified in the holding area and taken to the operating room and placed in the supine position.  After general endotracheal anesthesia was induced, the table was turned 45 degrees and the patient was placed in a semi-Fowler position.  The nose was then topically anesthetized with Lidocaine, cotton pledgets were placed within each nostril. After approximately 5 minutes, this was removed at which time a local anesthetic of 1% Lidocaine 1:100,000 units of Epinephrine was used to inject the inferior turbinates in the nasal septum. A total of 10 ml was used. Examination of the nose showed a severe right nasal septal deformity and tremendous hypertrophied inferior turbinate.  Beginning on the right hand side a hemitransfixion incision was then created on the leading edge of the septum on the right.  A subperichondrial plane was elevated posteriorly on the left and taken back to the perpendicular plate of the ethmoid where subperiosteal plane was elevated posteriorly on the left. A large septal spur was identified on the right hand side impacting on the inferior turbinate.  An inferior rim of cartilage was removed anteriorly with care taken to leave an anterior strut to prevent nasal collapse. With this strut removed the perpendicular plate of the ethmoid was separated from the quadrangular cartilage. The large septal spur was removed.  The septum was then replaced in the midline. Reinspection through each nostril showed excellent reduction of the septal deformity. A left posterior inferior fenestration was then created to allow hematoma  drainage.  With the septoplasty completed, beginning on the left-hand side, a 15 blade was used to incise along the inferior edge of the inferior turbinate. A superior laterally based flap was then elevated. The underlying conchal bone of mucosa was excised using Knight scissors. The flap was then laid back over the turbinate stump and cauterized using suction cautery. In a similar fashion the submucous resection was performed on the right.  With the submucous resection completed bilaterally and no active bleeding, the hemitransfixion incision was then closed using two interrupted 3-0 chromic sutures.  Plastic nasal septal splints were placed within each nostril and affixed to the septum using a 3-0 nylon suture. Stammberger was then used beneath each inferior turbinate for hemostasis.    The patient tolerated the procedure well, was returned to anesthesia, extubated in the operating room, and taken to the recovery room in stable condition.    CULTURES:  None.  SPECIMENS:  None.  ESTIMATED BLOOD LOSS:  25 cc.  Roena Malady  10/01/2017  12:23 PM

## 2017-10-01 NOTE — Anesthesia Postprocedure Evaluation (Signed)
Anesthesia Post Note  Patient: Kari Rice  Procedure(s) Performed: NASAL SEPTOPLASTY WITH SUBMUCOCELE RESECTION OF TURBINATE (Bilateral Nose)  Patient location during evaluation: PACU Anesthesia Type: General Level of consciousness: awake and alert Pain management: pain level controlled Vital Signs Assessment: post-procedure vital signs reviewed and stable Respiratory status: spontaneous breathing, nonlabored ventilation, respiratory function stable and patient connected to nasal cannula oxygen Cardiovascular status: blood pressure returned to baseline and stable Postop Assessment: no apparent nausea or vomiting Anesthetic complications: no    Nazier Neyhart

## 2017-10-01 NOTE — Transfer of Care (Signed)
Immediate Anesthesia Transfer of Care Note  Patient: Kari Rice  Procedure(s) Performed: NASAL SEPTOPLASTY WITH SUBMUCOCELE RESECTION OF TURBINATE (Bilateral Nose)  Patient Location: PACU  Anesthesia Type: General ETT  Level of Consciousness: awake, alert  and patient cooperative  Airway and Oxygen Therapy: Patient Spontanous Breathing and Patient connected to supplemental oxygen  Post-op Assessment: Post-op Vital signs reviewed, Patient's Cardiovascular Status Stable, Respiratory Function Stable, Patent Airway and No signs of Nausea or vomiting  Post-op Vital Signs: Reviewed and stable  Complications: No apparent anesthesia complications

## 2017-10-01 NOTE — Anesthesia Preprocedure Evaluation (Signed)
Anesthesia Evaluation  Patient identified by MRN, date of birth, ID band  Reviewed: NPO status   History of Anesthesia Complications (+) PONV and history of anesthetic complications  Airway Mallampati: II  TM Distance: >3 FB Neck ROM: full    Dental  (+) Chipped,    Pulmonary sleep apnea ( Mild -Moderate Sleep apnea with AHI 12.5) and Continuous Positive Airway Pressure Ventilation ,    Pulmonary exam normal        Cardiovascular Exercise Tolerance: Good + DVT (leg 2014)  Normal cardiovascular exam     Neuro/Psych  Headaches,  Neuromuscular disease (bells palsy 05/2017) negative psych ROS   GI/Hepatic negative GI ROS, Neg liver ROS,   Endo/Other  negative endocrine ROS  Renal/GU negative Renal ROS  negative genitourinary   Musculoskeletal  (+) Arthritis  (wrist), Osteoarthritis,    Abdominal   Peds  Hematology negative hematology ROS (+)   Anesthesia Other Findings   Reproductive/Obstetrics                             Anesthesia Physical Anesthesia Plan  ASA: II  Anesthesia Plan: General ETT   Post-op Pain Management:    Induction:   PONV Risk Score and Plan:   Airway Management Planned:   Additional Equipment:   Intra-op Plan:   Post-operative Plan:   Informed Consent: I have reviewed the patients History and Physical, chart, labs and discussed the procedure including the risks, benefits and alternatives for the proposed anesthesia with the patient or authorized representative who has indicated his/her understanding and acceptance.     Plan Discussed with: CRNA  Anesthesia Plan Comments:         Anesthesia Quick Evaluation

## 2017-10-04 ENCOUNTER — Encounter: Payer: Self-pay | Admitting: Unknown Physician Specialty

## 2017-10-15 ENCOUNTER — Other Ambulatory Visit: Payer: Self-pay | Admitting: *Deleted

## 2017-10-15 MED ORDER — MELOXICAM 15 MG PO TABS: 15.0000 mg | ORAL_TABLET | Freq: Every day | ORAL | 1 refills | Status: DC | PRN

## 2017-10-15 NOTE — Telephone Encounter (Signed)
CPE was on 08/10/17, last filled on 07/16/17 #30 tabs with 1 additional refill

## 2017-11-17 ENCOUNTER — Encounter: Payer: Self-pay | Admitting: Family Medicine

## 2017-11-22 ENCOUNTER — Ambulatory Visit: Payer: Managed Care, Other (non HMO) | Admitting: Family Medicine

## 2017-11-22 ENCOUNTER — Encounter: Payer: Self-pay | Admitting: Family Medicine

## 2017-11-22 VITALS — BP 124/72 | HR 70 | Temp 97.8°F | Ht 64.0 in | Wt 228.5 lb

## 2017-11-22 DIAGNOSIS — K219 Gastro-esophageal reflux disease without esophagitis: Secondary | ICD-10-CM

## 2017-11-22 DIAGNOSIS — R202 Paresthesia of skin: Secondary | ICD-10-CM | POA: Insufficient documentation

## 2017-11-22 MED ORDER — PANTOPRAZOLE SODIUM 20 MG PO TBEC: 20.0000 mg | DELAYED_RELEASE_TABLET | Freq: Every day | ORAL | 11 refills | Status: DC

## 2017-11-22 NOTE — Progress Notes (Signed)
Subjective:    Patient ID: Kari Rice, female    DOB: Feb 25, 1976, 42 y.o.   MRN: [id number]  HPI  Here for complaint of tingling - L upper back and arms and hand  3 weeks ago - she had to pull a mower that was stuck  Thinks she strained something   Her L 2nd, 3rd finger tingle along with area in upper back (L shoulder area)  Worse to prop up elbow-shoots up to there at times  Nothing hurts currently  No loss of strength or grip  No wrist symptoms   Worse driving  Worse at work- scanning   Had a tenosynovectomy on L hand - a while back  Is R hand dominant   Also had recent nasal surgery  Still some green nasal drainage  Overall much improved   Needs refill of protonix for GERD  (pulmonary doctor thought this was causing problems)  It takes care of her nausea and indigestion Works well if she does not miss a dose Watching diet for triggers   Wt Readings from Last 3 Encounters:  11/22/17 228 lb 8 oz (103.6 kg)  10/01/17 225 lb (102.1 kg)  08/17/17 241 lb (109.3 kg)   39.22 kg/m   Patient Active Problem List   Diagnosis Date Noted  . Left hand paresthesia 11/22/2017  . GERD (gastroesophageal reflux disease) 11/22/2017  . H/O vaginitis 08/10/2017  . Bell's palsy 07/07/2017  . Headache 07/07/2017  . Phlebitis and thrombophlebitis of other deep vessels of unspecified lower extremity ([place]) 05/28/2017  . Hair loss 03/25/2016  . Vitamin D deficiency 01/15/2016  . Routine general medical examination at a health care facility 05/29/2014  . Encounter for routine gynecological examination 05/29/2014  . Screening for HIV (human immunodeficiency virus) 05/29/2014  . Encounter for screening mammogram for breast cancer 05/29/2014  . Morbid obesity ([place]) 05/29/2014  . Joint pain 04/10/2014  . History of DVT (deep vein thrombosis) 07/06/2012  . Varicose veins of lower extremities with other complications 32/44/0102  . Unspecified venous (peripheral) insufficiency  12/23/2011  . Venous insufficiency 11/25/2011  . Phlebitis and thrombophlebitis of the leg 11/06/2011  . Venous insufficiency, peripheral 10/26/2011  . Pedal edema 10/26/2011  . IRREGULAR MENSES 02/29/2008  . 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 Arthritis    right hand  . DVT (deep venous thrombosis) ([place]) 2013  . PONV (postoperative nausea and vomiting)    after cholecystectomy  . Sleep apnea    CPAP  . Varicose veins    Past Surgical History:  Procedure Laterality Date  . CHOLECYSTECTOMY  Feb. 2006   Gall Bladder  . HAND SURGERY  2016  . NASAL SEPTOPLASTY W/ TURBINOPLASTY Bilateral 10/01/2017   Procedure: NASAL SEPTOPLASTY WITH SUBMUCOCELE RESECTION OF TURBINATE;  Surgeon: Beverly Gust, MD;  Location: [facility];  Service: ENT;  Laterality: Bilateral;  Sleep apnea  . THROMBECTOMY Right 01/13/12 and  01/21/12   Right iliac vein stent and Vein thrombosis   Social History   Tobacco Use  . Smoking status: Never Smoker  . Smokeless tobacco: Never Used  Substance Use Topics  . Alcohol use: No    Alcohol/week: 0.0 standard drinks  . Drug use: No   Family History  Problem Relation Age of Onset  . Arthritis Mother   . Cancer Mother        uterine   . Heart disease Father        Heart  Disease before age 70  . Cancer Father        skin   . Hypertension Father   . Heart attack Father    Allergies  Allergen Reactions  . Penicillins Rash  . Sulfa Antibiotics Rash   Current Outpatient Medications on File Prior to Visit  Medication Sig Dispense Refill  . Cholecalciferol (VITAMIN D3) 2000 units TABS Take 1 tablet by mouth daily.    . NON FORMULARY Support hose to the waist for dx of venous insufficiency and edema 15-20 mm Hg     No current facility-administered medications on file prior to visit.     Review of Systems  Constitutional: Negative for activity change, appetite change, fatigue, fever and unexpected weight  change.  HENT: Negative for congestion, ear pain, rhinorrhea, sinus pressure and sore throat.   Eyes: Negative for pain, redness and visual disturbance.  Respiratory: Negative for cough, shortness of breath and wheezing.   Cardiovascular: Negative for chest pain and palpitations.  Gastrointestinal: Negative for abdominal pain, blood in stool, constipation and diarrhea.  Endocrine: Negative for polydipsia and polyuria.  Genitourinary: Negative for dysuria, frequency and urgency.  Musculoskeletal: Positive for back pain. Negative for arthralgias, myalgias and neck pain.       Occ back pain   Skin: Negative for pallor and rash.  Allergic/Immunologic: Negative for environmental allergies.  Neurological: Positive for numbness. Negative for dizziness, syncope and headaches.       Tingling in fingers-sometimes up arm  Hematological: Negative for adenopathy. Does not bruise/bleed easily.  Psychiatric/Behavioral: Negative for decreased concentration and dysphoric mood. The patient is not nervous/anxious.        Objective:   Physical Exam  Constitutional: She appears well-developed and well-nourished. No distress.  obese and well appearing   HENT:  Head: Normocephalic and atraumatic.  Mouth/Throat: Oropharynx is clear and moist.  Eyes: Pupils are equal, round, and reactive to light. Conjunctivae and EOM are normal. No scleral icterus.  Neck: Normal range of motion. Neck supple.  Cardiovascular: Normal rate, regular rhythm and normal heart sounds.  Pulmonary/Chest: Effort normal and breath sounds normal. No respiratory distress. She has no wheezes.  Abdominal: Soft. Bowel sounds are normal. She exhibits no distension. There is no tenderness.  Musculoskeletal: She exhibits no edema, tenderness or deformity.  No CS or TS tenderness  Nl rom of L shoulder/elbow and wrist  Mild tenderness over L epicondyle -that does not provoke hand symptoms however  Lymphadenopathy:    She has no cervical  adenopathy.  Neurological: She is alert. She displays normal reflexes. No cranial nerve deficit. Coordination normal.  Pos tinel's sign in L hand for tingling in 2nd, 3rd fingers  equivocal phalen test  Nl grip bilat    Skin: Skin is warm and dry. No rash noted. No erythema. No pallor.  Psychiatric: She has a normal mood and affect.          Assessment & Plan:   Problem List Items Addressed This Visit      Digestive   GERD (gastroesophageal reflux disease)    She was px protonix from pulmonary in the past It helps indigestion and chronic nausea Needs [place] to take over the px  Refilled 1 y  Disc diet to reduce symptoms (spicy/caffeine/carbonation) Had [place] food-this worsened symptoms       Relevant Medications   pantoprazole (PROTONIX) 20 MG tablet     Other   Left hand paresthesia - Primary    Positive tinel's sign, also  mild lateral elbow tenderness Exam most consistent with radial nerve issue/ carpal tunnel Will wear wrist splint at night and prn  meloxciam daily for 2 weeks (with food)  Change work ergonomics as able  Handout given  If not improved-will contact us in 2 weeks (consider hand specialist)- of note she had tenosynovectomy in this hand in the past as well

## 2017-11-22 NOTE — Patient Instructions (Addendum)
Wear a wrist splint (with metal stay) - at night when sleeping As needed with working/ driving when you can   meloxicam may help - take daily with food for 2 weeks - it helps inflammation in the wrist   Watch for elbow pain , shoulder pain or anything new  Update [place] in about 2 weeks- if not improving we would refer you to a specialist

## 2017-11-22 NOTE — Assessment & Plan Note (Signed)
She was px protonix from pulmonary in the past It helps indigestion and chronic nausea Needs [place] to take over the px  Refilled 1 y  Disc diet to reduce symptoms (spicy/caffeine/carbonation) Had [place] food-this worsened symptoms

## 2017-11-22 NOTE — Assessment & Plan Note (Signed)
Positive tinel's sign, also mild lateral elbow tenderness Exam most consistent with radial nerve issue/ carpal tunnel Will wear wrist splint at night and prn  meloxciam daily for 2 weeks (with food)  Change work ergonomics as able  Handout given  If not improved-will contact us in 2 weeks (consider hand specialist)- of note she had tenosynovectomy in this hand in the past as well

## 2017-12-20 ENCOUNTER — Other Ambulatory Visit: Payer: Self-pay | Admitting: *Deleted

## 2017-12-20 MED ORDER — MELOXICAM 15 MG PO TABS: 15.0000 mg | ORAL_TABLET | Freq: Every day | ORAL | 3 refills | Status: DC | PRN

## 2017-12-20 NOTE — Telephone Encounter (Signed)
Last OV was 11/22/17, last filled on 10/15/17 #30 with 1 refills, please advise

## 2018-01-10 ENCOUNTER — Telehealth: Payer: Self-pay

## 2018-01-10 NOTE — Telephone Encounter (Signed)
I spoke with pt; pt has seen bright red blood in toilet water on 01/09/18; pt went to bed and when woke up there was less blood in toilet water. Pt thinks may have had a tear and it improved as the day went on; today pt has started menstrual period but wants to keep appt she made with Dr Glori Bickers on 01/11/18 at 8 AM. Pt has not had any abd pain, UTI symptoms and no evidence of bleeding with BM.  I left appt the same but did change to a 30' appt. Pt voiced understanding if condition worsens or changes prior to appt pt can call [facility] or if at night go to [facility]. FYI to Dr Glori Bickers.

## 2018-01-10 NOTE — Telephone Encounter (Signed)
I will see her as planned Thanks

## 2018-01-11 ENCOUNTER — Encounter: Payer: Self-pay | Admitting: Family Medicine

## 2018-01-11 ENCOUNTER — Ambulatory Visit: Payer: Managed Care, Other (non HMO) | Admitting: Family Medicine

## 2018-01-11 VITALS — BP 132/74 | HR 70 | Temp 97.4°F | Ht 64.0 in | Wt 229.8 lb

## 2018-01-11 DIAGNOSIS — S90413A Abrasion, unspecified great toe, initial encounter: Secondary | ICD-10-CM | POA: Insufficient documentation

## 2018-01-11 DIAGNOSIS — S90411A Abrasion, right great toe, initial encounter: Secondary | ICD-10-CM | POA: Diagnosis not present

## 2018-01-11 DIAGNOSIS — Z23 Encounter for immunization: Secondary | ICD-10-CM

## 2018-01-11 MED ORDER — DOXYCYCLINE HYCLATE 100 MG PO TABS: 100.0000 mg | ORAL_TABLET | Freq: Two times a day (BID) | ORAL | 0 refills | Status: DC

## 2018-01-11 NOTE — Patient Instructions (Signed)
Keep wound clean with soap and water Take the doxycycline as directed   Cover when in potential dirty area  Antibiotic ointment is fine   Alert [place] if increase in redness/pain/swelling or any drainage that changes   Tetanus shot is up to date Flu shot today

## 2018-01-11 NOTE — Progress Notes (Signed)
Subjective:    Patient ID: Kari Rice, female    DOB: 1975-08-27, 42 y.o.   MRN: [id number]  HPI  Here for a R big toe injury   Wt Readings from Last 3 Encounters:  01/11/18 229 lb 12 oz (104.2 kg)  11/22/17 228 lb 8 oz (103.6 kg)  10/01/17 225 lb (102.1 kg)   39.44 kg/m   Injured her foot - opening a heavy glass door over foot (in flip flops)  This was 1 week ago  It bled a fair amount - cleaned it up (soap and water)  Was able to get bleeding stopped  Used band aid  Is sore / tight sensation of the skin  A little redness  Drained a little clear material- then stopped   Has had toenail removal on foot anyway   Needs flu shot today  Had Tdap in April -UTD  Patient Active Problem List   Diagnosis Date Noted  . Abrasion of great toe 01/11/2018  . Left hand paresthesia 11/22/2017  . GERD (gastroesophageal reflux disease) 11/22/2017  . H/O vaginitis 08/10/2017  . Bell's palsy 07/07/2017  . Headache 07/07/2017  . Phlebitis and thrombophlebitis of other deep vessels of unspecified lower extremity ([place]) 05/28/2017  . Hair loss 03/25/2016  . Vitamin D deficiency 01/15/2016  . Routine general medical examination at a health care facility 05/29/2014  . Encounter for routine gynecological examination 05/29/2014  . Screening for HIV (human immunodeficiency virus) 05/29/2014  . Encounter for screening mammogram for breast cancer 05/29/2014  . Morbid obesity ([place]) 05/29/2014  . Joint pain 04/10/2014  . History of DVT (deep vein thrombosis) 07/06/2012  . Varicose veins of lower extremities with other complications 24/12/7351  . Unspecified venous (peripheral) insufficiency 12/23/2011  . Venous insufficiency 11/25/2011  . Phlebitis and thrombophlebitis of the leg 11/06/2011  . Venous insufficiency, peripheral 10/26/2011  . Pedal edema 10/26/2011  . IRREGULAR MENSES 02/29/2008  . 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 Arthritis    right hand  . DVT (deep venous thrombosis) ([place]) 2013  . PONV (postoperative nausea and vomiting)    after cholecystectomy  . Sleep apnea    CPAP  . Varicose veins    Past Surgical History:  Procedure Laterality Date  . CHOLECYSTECTOMY  Feb. 2006   Gall Bladder  . HAND SURGERY  2016  . NASAL SEPTOPLASTY W/ TURBINOPLASTY Bilateral 10/01/2017   Procedure: NASAL SEPTOPLASTY WITH SUBMUCOCELE RESECTION OF TURBINATE;  Surgeon: Beverly Gust, MD;  Location: [facility];  Service: ENT;  Laterality: Bilateral;  Sleep apnea  . THROMBECTOMY Right 01/13/12 and  01/21/12   Right iliac vein stent and Vein thrombosis   Social History   Tobacco Use  . Smoking status: Never Smoker  . Smokeless tobacco: Never Used  Substance Use Topics  . Alcohol use: No    Alcohol/week: 0.0 standard drinks  . Drug use: No   Family History  Problem Relation Age of Onset  . Arthritis Mother   . Cancer Mother        uterine   . Heart disease Father        Heart Disease before age 79  . Cancer Father        skin   . Hypertension Father   . Heart attack Father    Allergies  Allergen Reactions  . Penicillins Rash  . Sulfa Antibiotics Rash   Current Outpatient Medications on File Prior to Visit  Medication Sig Dispense Refill  . Cholecalciferol (VITAMIN D3) 2000 units TABS Take 1 tablet by mouth daily.    . meloxicam (MOBIC) 15 MG tablet Take 1 tablet (15 mg total) by mouth daily as needed for pain. Take with food 30 tablet 3  . NON FORMULARY Support hose to the waist for dx of venous insufficiency and edema 15-20 mm Hg    . pantoprazole (PROTONIX) 20 MG tablet Take 1 tablet (20 mg total) by mouth daily. 30 tablet 11   No current facility-administered medications on file prior to visit.     Review of Systems  Constitutional: Negative for activity change, appetite change, fatigue, fever and unexpected weight change.  HENT: Negative for congestion, ear pain, rhinorrhea, sinus pressure and sore  throat.   Eyes: Negative for pain, redness and visual disturbance.  Respiratory: Negative for cough, shortness of breath and wheezing.   Cardiovascular: Negative for chest pain and palpitations.  Gastrointestinal: Negative for abdominal pain, blood in stool, constipation and diarrhea.  Endocrine: Negative for polydipsia and polyuria.  Genitourinary: Negative for dysuria, frequency and urgency.  Musculoskeletal: Negative for arthralgias, back pain and myalgias.  Skin: Positive for wound. Negative for pallor and rash.  Allergic/Immunologic: Negative for environmental allergies.  Neurological: Negative for dizziness, syncope and headaches.  Hematological: Negative for adenopathy. Does not bruise/bleed easily.  Psychiatric/Behavioral: Negative for decreased concentration and dysphoric mood. The patient is not nervous/anxious.        Objective:   Physical Exam  Constitutional: She appears well-developed and well-nourished. No distress.  obese and well appearing   HENT:  Head: Normocephalic and atraumatic.  Eyes: Pupils are equal, round, and reactive to light. Conjunctivae and EOM are normal.  Neck: Normal range of motion. Neck supple.  Cardiovascular: Normal rate, regular rhythm and normal heart sounds.  Pulmonary/Chest: Effort normal and breath sounds normal. No respiratory distress. She has no wheezes.  Neurological: She is alert. No sensory deficit. She exhibits normal muscle tone.  Skin: Skin is warm and dry. No rash noted. There is erythema. No pallor.  R great toe  Medial 2 cm superficial laceration at a diagonal to the nail (baseline toe nail is thickened and narrow due to removal in the past) Some yellow crust at medial edge of the nail  Mild erythema of entire toe w/o streaking  Very mild swelling  Not tender to palpation  Sensation/ perfusion intact Nl rom of toe/foot  Psychiatric: She has a normal mood and affect.          Assessment & Plan:   Problem List Items  Addressed This Visit      Musculoskeletal and Integument   Abrasion of great toe - Primary    Injured a week ago by a heavy door- nail not involved  Looks to be healing but still mild redness/ swelling  Will cover with doxycycline bid for 7 d (she is pcn and sulfa all) Wound care-soap and water bid or more often- abx oint prn Update if not starting to improve in a week or if worsening  -esp if inc red/swelling/pain or drainage        Other Visit Diagnoses    Need for influenza vaccination       Relevant Orders   Flu Vaccine QUAD 6+ mos PF IM (Fluarix Quad PF) (Completed)

## 2018-01-11 NOTE — Assessment & Plan Note (Signed)
Injured a week ago by a heavy door- nail not involved  Looks to be healing but still mild redness/ swelling  Will cover with doxycycline bid for 7 d (she is pcn and sulfa all) Wound care-soap and water bid or more often- abx oint prn Update if not starting to improve in a week or if worsening  -esp if inc red/swelling/pain or drainage

## 2018-01-25 ENCOUNTER — Ambulatory Visit: Payer: Managed Care, Other (non HMO) | Admitting: Family Medicine

## 2018-01-25 ENCOUNTER — Encounter: Payer: Self-pay | Admitting: Family Medicine

## 2018-01-25 VITALS — BP 114/78 | HR 85 | Temp 98.2°F | Ht 64.0 in | Wt 228.0 lb

## 2018-01-25 DIAGNOSIS — S99922A Unspecified injury of left foot, initial encounter: Secondary | ICD-10-CM | POA: Insufficient documentation

## 2018-01-25 NOTE — Progress Notes (Signed)
Subjective:    Patient ID: Kari Rice, female    DOB: 20-Jul-1975, 42 y.o.   MRN: [id number]  HPI Here for L great toe injury  Tore her nail off   This happened sat am  Was moving couch  It ran over toe- abraded it  Toe nail had fungus-came off easily  Did not hurt for about 15 min Then bled some on L side  Stopped with pressure   Now it aches / occ sharp pain  Took meloxicam  Cleaning it with soap and water-and used neosporin  Has kept it bandaged Not red ? A little drainage/ unsure if yellow   Tdap up to date   Patient Active Problem List   Diagnosis Date Noted  . Injury of left toe 01/25/2018  . Abrasion of great toe 01/11/2018  . Left hand paresthesia 11/22/2017  . GERD (gastroesophageal reflux disease) 11/22/2017  . H/O vaginitis 08/10/2017  . Bell's palsy 07/07/2017  . Headache 07/07/2017  . Phlebitis and thrombophlebitis of other deep vessels of unspecified lower extremity ([place]) 05/28/2017  . Hair loss 03/25/2016  . Vitamin D deficiency 01/15/2016  . Routine general medical examination at a health care facility 05/29/2014  . Encounter for routine gynecological examination 05/29/2014  . Screening for HIV (human immunodeficiency virus) 05/29/2014  . Encounter for screening mammogram for breast cancer 05/29/2014  . Morbid obesity ([place]) 05/29/2014  . Joint pain 04/10/2014  . History of DVT (deep vein thrombosis) 07/06/2012  . Varicose veins of lower extremities with other complications 74/14/2395  . Unspecified venous (peripheral) insufficiency 12/23/2011  . Venous insufficiency 11/25/2011  . Phlebitis and thrombophlebitis of the leg 11/06/2011  . Venous insufficiency, peripheral 10/26/2011  . Pedal edema 10/26/2011  . IRREGULAR MENSES 02/29/2008  . 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 Arthritis    right hand  . DVT (deep venous thrombosis) ([place]) 2013  . PONV (postoperative nausea and vomiting)    after  cholecystectomy  . Sleep apnea    CPAP  . Varicose veins    Past Surgical History:  Procedure Laterality Date  . CHOLECYSTECTOMY  Feb. 2006   Gall Bladder  . HAND SURGERY  2016  . NASAL SEPTOPLASTY W/ TURBINOPLASTY Bilateral 10/01/2017   Procedure: NASAL SEPTOPLASTY WITH SUBMUCOCELE RESECTION OF TURBINATE;  Surgeon: Beverly Gust, MD;  Location: [facility];  Service: ENT;  Laterality: Bilateral;  Sleep apnea  . THROMBECTOMY Right 01/13/12 and  01/21/12   Right iliac vein stent and Vein thrombosis   Social History   Tobacco Use  . Smoking status: Never Smoker  . Smokeless tobacco: Never Used  Substance Use Topics  . Alcohol use: No    Alcohol/week: 0.0 standard drinks  . Drug use: No   Family History  Problem Relation Age of Onset  . Arthritis Mother   . Cancer Mother        uterine   . Heart disease Father        Heart Disease before age 7  . Cancer Father        skin   . Hypertension Father   . Heart attack Father    Allergies  Allergen Reactions  . Penicillins Rash  . Sulfa Antibiotics Rash   Current Outpatient Medications on File Prior to Visit  Medication Sig Dispense Refill  . Cholecalciferol (VITAMIN D3) 2000 units TABS Take 1 tablet by mouth daily.    . meloxicam (MOBIC) 15 MG tablet  Take 1 tablet (15 mg total) by mouth daily as needed for pain. Take with food 30 tablet 3  . NON FORMULARY Support hose to the waist for dx of venous insufficiency and edema 15-20 mm Hg    . pantoprazole (PROTONIX) 20 MG tablet Take 1 tablet (20 mg total) by mouth daily. 30 tablet 11   No current facility-administered medications on file prior to visit.     Review of Systems  Constitutional: Positive for fatigue. Negative for chills, fever and unexpected weight change.  Eyes: Negative for visual disturbance.  Respiratory: Negative for cough and shortness of breath.   Cardiovascular: Negative for chest pain and leg swelling.  Musculoskeletal: Positive for  arthralgias. Negative for gait problem.  Skin: Positive for wound.  Neurological: Negative for tremors, weakness and numbness.  Hematological: Negative for adenopathy. Does not bruise/bleed easily.       Objective:   Physical Exam  Constitutional: She appears well-developed and well-nourished. No distress.  obese and well appearing   HENT:  Head: Normocephalic and atraumatic.  Eyes: Pupils are equal, round, and reactive to light. Conjunctivae and EOM are normal. No scleral icterus.  Neck: Normal range of motion. Neck supple.  Cardiovascular: Normal rate, regular rhythm and normal heart sounds.  Pulmonary/Chest: Effort normal and breath sounds normal. No respiratory distress. She has no wheezes.  Musculoskeletal: Normal range of motion. She exhibits tenderness. She exhibits no edema.  L great toe-mild swelling and ecchymosis  Nail is loose but not coming off  Lateral edge has pulled away from nail base Some wet material from under nail (looks to be self debriding fungal matter)  Scant to no erythema  Nl rom /sensation   Lymphadenopathy:    She has no cervical adenopathy.  Neurological: She is alert. She displays normal reflexes. She exhibits normal muscle tone. Coordination normal.  Skin: Skin is warm and dry. No rash noted.  Psychiatric: She has a normal mood and affect.          Assessment & Plan:   Problem List Items Addressed This Visit      Other   Injury of left toe - Primary    From moving cough  L great toe- loose nail and some tenderness w/o signs of infection Inst soap and water cleanse  Cover lightly to protect nail  Expect her to loose nail soon  Declines post op shoe-disc wearing shoe with tall toe box  abx oint prn Watch for inc redness/swelling/pain and update

## 2018-01-25 NOTE — Patient Instructions (Addendum)
You will likely loose your toe nail soon  Keep the foot very clean (soap and water at least twice daily) and dry  Antibiotic ointment  Soft protective bandage Wear a shoe with a tall toe box - as protective as possible  Watch for redness/swelling/increased pain or drainage   Keep [place] updated

## 2018-01-25 NOTE — Assessment & Plan Note (Signed)
From moving cough  L great toe- loose nail and some tenderness w/o signs of infection Inst soap and water cleanse  Cover lightly to protect nail  Expect her to loose nail soon  Declines post op shoe-disc wearing shoe with tall toe box  abx oint prn Watch for inc redness/swelling/pain and update

## 2018-02-25 ENCOUNTER — Telehealth: Payer: Self-pay

## 2018-02-25 NOTE — Telephone Encounter (Signed)
If you can get in touch with her -please do, thanks

## 2018-02-25 NOTE — Telephone Encounter (Signed)
Left v/m requesting pt to cb. 

## 2018-02-25 NOTE — Telephone Encounter (Signed)
Pt stated she was fine to wait to be seen for Monday.

## 2018-02-25 NOTE — Telephone Encounter (Signed)
Raquel Sarna skyped me this message;   [02/25/2018 8:37 AM]  Donzetta Matters:   Hi! Kari Rice ([id number]) scheduled an appointment for Monday with Dr. Glori Bickers. In the notes, she put dizziness, headache, chills. Since it mentions dizziness, does she need to be triaged?  This is a mychart appt that was scheduled.   [02/25/2018 8:39 AM]  Ozzie Hoyle:   i will give her a call; thanks for letting me know.   I was unable to reach pt by phone. I was unable to reach pts mom by phone (DPR signed). I was unable to reach pt by phone again.

## 2018-02-28 ENCOUNTER — Encounter: Payer: Self-pay | Admitting: Family Medicine

## 2018-02-28 ENCOUNTER — Ambulatory Visit: Payer: Managed Care, Other (non HMO) | Admitting: Family Medicine

## 2018-02-28 VITALS — BP 114/76 | HR 69 | Temp 98.1°F | Ht 64.0 in | Wt 232.2 lb

## 2018-02-28 DIAGNOSIS — J019 Acute sinusitis, unspecified: Secondary | ICD-10-CM | POA: Insufficient documentation

## 2018-02-28 DIAGNOSIS — J0110 Acute frontal sinusitis, unspecified: Secondary | ICD-10-CM | POA: Diagnosis not present

## 2018-02-28 MED ORDER — PANTOPRAZOLE SODIUM 20 MG PO TBEC: 20.0000 mg | DELAYED_RELEASE_TABLET | Freq: Every day | ORAL | 11 refills | Status: DC

## 2018-02-28 MED ORDER — DOXYCYCLINE HYCLATE 100 MG PO TABS: 100.0000 mg | ORAL_TABLET | Freq: Two times a day (BID) | ORAL | 0 refills | Status: DC

## 2018-02-28 NOTE — Assessment & Plan Note (Signed)
With L frontal /maxillary sinus tenderness (h/o sinus surgery) Prev dizziness is improved Px doxycycline  Enc her to start sinus irrigation again Disc symptomatic care - see instructions on AVS  Update if not starting to improve in a week or if worsening

## 2018-02-28 NOTE — Patient Instructions (Signed)
Start irrigating sinuses again  Take doxycycline as directed for sinus infection   If dizziness does not continue to improve-let me know    Update if not starting to improve in a week or if worsening

## 2018-02-28 NOTE — Progress Notes (Signed)
Subjective:    Patient ID: Kari Rice, female    DOB: 1975-10-19, 42 y.o.   MRN: [id number]  HPI Here for facial pain and headache/chills/dizziness Last week had some pain above L eye Thought she was coming down with a cold -congested  Also some dizzy when she turns her head   Tired-new schedule  Also periods have started to come earlier / hormonal change   Wt Readings from Last 3 Encounters:  02/28/18 232 lb 4 oz (105.3 kg)  01/25/18 228 lb (103.4 kg)  01/11/18 229 lb 12 oz (104.2 kg)   39.87 kg/m   Today a little pain remains Feels a little puffy under her eye  Nasal d/c - occ yellow/green  Ears feel ok -waxy  No cough   Had some chills but no fever   Patient Active Problem List   Diagnosis Date Noted  . Acute sinusitis 02/28/2018  . Injury of left toe 01/25/2018  . Abrasion of great toe 01/11/2018  . Left hand paresthesia 11/22/2017  . GERD (gastroesophageal reflux disease) 11/22/2017  . H/O vaginitis 08/10/2017  . Bell's palsy 07/07/2017  . Headache 07/07/2017  . Phlebitis and thrombophlebitis of other deep vessels of unspecified lower extremity ([place]) 05/28/2017  . Hair loss 03/25/2016  . Vitamin D deficiency 01/15/2016  . Routine general medical examination at a health care facility 05/29/2014  . Encounter for routine gynecological examination 05/29/2014  . Screening for HIV (human immunodeficiency virus) 05/29/2014  . Encounter for screening mammogram for breast cancer 05/29/2014  . Morbid obesity ([place]) 05/29/2014  . Joint pain 04/10/2014  . History of DVT (deep vein thrombosis) 07/06/2012  . Varicose veins of lower extremities with other complications 16/60/6301  . Unspecified venous (peripheral) insufficiency 12/23/2011  . Venous insufficiency 11/25/2011  . Phlebitis and thrombophlebitis of the leg 11/06/2011  . Venous insufficiency, peripheral 10/26/2011  . Pedal edema 10/26/2011  . IRREGULAR MENSES 02/29/2008  . 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 Arthritis    right hand  . DVT (deep venous thrombosis) ([place]) 2013  . PONV (postoperative nausea and vomiting)    after cholecystectomy  . Sleep apnea    CPAP  . Varicose veins    Past Surgical History:  Procedure Laterality Date  . CHOLECYSTECTOMY  Feb. 2006   Gall Bladder  . HAND SURGERY  2016  . NASAL SEPTOPLASTY W/ TURBINOPLASTY Bilateral 10/01/2017   Procedure: NASAL SEPTOPLASTY WITH SUBMUCOCELE RESECTION OF TURBINATE;  Surgeon: Beverly Gust, MD;  Location: [facility];  Service: ENT;  Laterality: Bilateral;  Sleep apnea  . THROMBECTOMY Right 01/13/12 and  01/21/12   Right iliac vein stent and Vein thrombosis   Social History   Tobacco Use  . Smoking status: Never Smoker  . Smokeless tobacco: Never Used  Substance Use Topics  . Alcohol use: No    Alcohol/week: 0.0 standard drinks  . Drug use: No   Family History  Problem Relation Age of Onset  . Arthritis Mother   . Cancer Mother        uterine   . Heart disease Father        Heart Disease before age 59  . Cancer Father        skin   . Hypertension Father   . Heart attack Father    Allergies  Allergen Reactions  . Penicillins Rash  . Sulfa Antibiotics Rash   Current Outpatient Medications on File Prior to Visit  Medication Sig Dispense Refill  . Cholecalciferol (VITAMIN D3) 2000 units TABS Take 1 tablet by mouth daily.    . meloxicam (MOBIC) 15 MG tablet Take 1 tablet (15 mg total) by mouth daily as needed for pain. Take with food 30 tablet 3  . NON FORMULARY Support hose to the waist for dx of venous insufficiency and edema 15-20 mm Hg     No current facility-administered medications on file prior to visit.     Review of Systems  Constitutional: Positive for appetite change. Negative for fatigue and fever.  HENT: Positive for congestion, ear pain, postnasal drip, rhinorrhea, sinus pressure and sore throat. Negative for nosebleeds.   Eyes: Negative  for pain, redness and itching.  Respiratory: Negative for cough, shortness of breath and wheezing.   Cardiovascular: Negative for chest pain.  Gastrointestinal: Negative for abdominal pain, diarrhea, nausea and vomiting.  Endocrine: Negative for polyuria.  Genitourinary: Negative for dysuria, frequency and urgency.  Musculoskeletal: Negative for arthralgias and myalgias.  Allergic/Immunologic: Negative for immunocompromised state.  Neurological: Positive for dizziness and headaches. Negative for tremors, seizures, syncope, speech difficulty, weakness, light-headedness and numbness.  Hematological: Negative for adenopathy. Does not bruise/bleed easily.  Psychiatric/Behavioral: Negative for dysphoric mood. The patient is not nervous/anxious.        Objective:   Physical Exam  Constitutional: She appears well-developed and well-nourished. No distress.  obese and well appearing   HENT:  Head: Normocephalic and atraumatic.  Right Ear: External ear normal.  Left Ear: External ear normal.  Mouth/Throat: Oropharynx is clear and moist. No oropharyngeal exudate.  Nares are injected and congested  L frontal and  maxillary sinus tenderness  Post nasal drip    Eyes: Pupils are equal, round, and reactive to light. Conjunctivae and EOM are normal. Right eye exhibits no discharge. Left eye exhibits no discharge. No scleral icterus.  Neck: Normal range of motion. Neck supple.  Cardiovascular: Normal rate and regular rhythm.  Pulmonary/Chest: Effort normal and breath sounds normal. No respiratory distress. She has no wheezes. She has no rales.  Lymphadenopathy:    She has no cervical adenopathy.  Neurological: She is alert. No cranial nerve deficit.  Skin: Skin is warm and dry. No rash noted.  Psychiatric: She has a normal mood and affect.          Assessment & Plan:   Problem List Items Addressed This Visit      Respiratory   Acute sinusitis - Primary    With L frontal /maxillary sinus  tenderness (h/o sinus surgery) Prev dizziness is improved Px doxycycline  Enc her to start sinus irrigation again Disc symptomatic care - see instructions on AVS  Update if not starting to improve in a week or if worsening        Relevant Medications   doxycycline (VIBRA-TABS) 100 MG tablet

## 2018-04-26 ENCOUNTER — Ambulatory Visit: Payer: Managed Care, Other (non HMO) | Admitting: Family Medicine

## 2018-04-26 ENCOUNTER — Encounter: Payer: Self-pay | Admitting: Family Medicine

## 2018-04-26 VITALS — BP 116/64 | HR 87 | Temp 98.2°F | Ht 64.0 in | Wt 233.8 lb

## 2018-04-26 DIAGNOSIS — M25562 Pain in left knee: Secondary | ICD-10-CM | POA: Diagnosis not present

## 2018-04-26 NOTE — Progress Notes (Signed)
BP 116/64 (BP Location: Left Arm, Patient Position: Sitting, Cuff Size: Large)   Pulse 87   Temp 98.2 F (36.8 C) (Oral)   Ht 5\' 4"  (1.626 m)   Wt 233 lb 12 oz (106 kg)   LMP 04/26/2018   SpO2 99%   BMI 40.12 kg/m    CC: L knee pain Subjective:    Patient ID: Kari Rice, female    DOB: 11-Dec-1975, 43 y.o.   MRN: [id number]  HPI: Kari Rice is a 43 y.o. female presenting on 04/26/2018 for Knee Pain (C/o left anterior and lateral knee pain. Pain occurs when going to standing/sitting position. Started 04/23/18. Denies injury. )   3-4 d h/o L knee pain worse with transitions from sitting to standing and going down steps/incline. Started mid day Saturday while at work - had to leave shift early due to pain (significant time on her feet at work). Denies inciting trauma/injury or falls. Tylenol helps temporarily. No new exercise routine. No locking of knee. No instability of knee. No redness/swelling or warmth of knee.   H/o kneecaps out of alignment.      Relevant past medical, surgical, family and social history reviewed and updated as indicated. Interim medical history since our last visit reviewed. Allergies and medications reviewed and updated. Outpatient Medications Prior to Visit  Medication Sig Dispense Refill  . Cholecalciferol (VITAMIN D3) 2000 units TABS Take 1 tablet by mouth daily.    . meloxicam (MOBIC) 15 MG tablet Take 1 tablet (15 mg total) by mouth daily as needed for pain. Take with food 30 tablet 3  . NON FORMULARY Support hose to the waist for dx of venous insufficiency and edema 15-20 mm Hg    . pantoprazole (PROTONIX) 20 MG tablet Take 1 tablet (20 mg total) by mouth daily. 30 tablet 11  . doxycycline (VIBRA-TABS) 100 MG tablet Take 1 tablet (100 mg total) by mouth 2 (two) times daily. 14 tablet 0   No facility-administered medications prior to visit.      Per HPI unless specifically indicated in ROS section below Review of Systems Objective:      BP 116/64 (BP Location: Left Arm, Patient Position: Sitting, Cuff Size: Large)   Pulse 87   Temp 98.2 F (36.8 C) (Oral)   Ht 5\' 4"  (1.626 m)   Wt 233 lb 12 oz (106 kg)   LMP 04/26/2018   SpO2 99%   BMI 40.12 kg/m   Wt Readings from Last 3 Encounters:  04/26/18 233 lb 12 oz (106 kg)  02/28/18 232 lb 4 oz (105.3 kg)  01/25/18 228 lb (103.4 kg)    Physical Exam Vitals signs and nursing note reviewed.  Constitutional:      Appearance: Normal appearance. She is not ill-appearing.  Musculoskeletal: Normal range of motion.     Right lower leg: No edema.     Left lower leg: No edema.     Comments: R knee WNL L knee exam: No deformity on inspection. Tender to palpation along lateral joint line No effusion/swelling noted. FROM in flex/extension without crepitus. No popliteal fullness. Neg drawer test. Tender with mcmurray testing. No pain with valgus/varus stress. + PFgrind. No abnormal patellar mobility.   Neurological:     Mental Status: She is alert.        Assessment & Plan:   Problem List Items Addressed This Visit    Left anterior knee pain - Primary    Left anterolateral knee pain of a  few days duration. Story most consistent with PFPS, r/o meniscal issue. Supportive care reviewed as per instructions. Handout on runner's knee from [facility] pt advisor provided to patient. rec ice, meloxicam, brace. Update if not improving with treatment.           No orders of the defined types were placed in this encounter.  No orders of the defined types were placed in this encounter.  Patient Instructions  I think you have runner's knee or patellofemoral pain syndrome - inflammation of space between kneecap and knee. Treat with ice or heating pad, meloxicam daily for next 7-10 days, do exercises provided, consider knee sleeve brace. Let us know if not improving with this treatment.     Follow up plan: Return if symptoms worsen or fail to improve.  Ria Bush, MD

## 2018-04-26 NOTE — Assessment & Plan Note (Signed)
Left anterolateral knee pain of a few days duration. Story most consistent with PFPS, r/o meniscal issue. Supportive care reviewed as per instructions. Handout on runner's knee from [facility] pt advisor provided to patient. rec ice, meloxicam, brace. Update if not improving with treatment.

## 2018-04-26 NOTE — Patient Instructions (Signed)
I think you have runner's knee or patellofemoral pain syndrome - inflammation of space between kneecap and knee. Treat with ice or heating pad, meloxicam daily for next 7-10 days, do exercises provided, consider knee sleeve brace. Let us know if not improving with this treatment.

## 2018-07-19 ENCOUNTER — Telehealth: Payer: Self-pay

## 2018-07-19 NOTE — Telephone Encounter (Signed)
Agree with that plan, I will watch for notes

## 2018-07-19 NOTE — Telephone Encounter (Signed)
On 07/17/18 started with allergy like symptoms, runny nose,diarrhea and then yesterday added dizziness. Today dizziness continues with movement and labored breathing and pounding heart beat (not beating fast per pt),pt said has not had pounding heart or labored breathing before. No fever, thermometer is broken. No cough, S/t or CP.  Pt feeling fatigued today. Pt is supposed to go to work at [employer] on 07/20/18 and wants to be checked out before going back to work. Pt is going to [facility] now. FYI to Dr Glori Bickers.

## 2018-07-20 DIAGNOSIS — M222X9 Patellofemoral disorders, unspecified knee: Secondary | ICD-10-CM | POA: Insufficient documentation

## 2018-08-15 ENCOUNTER — Encounter: Payer: Self-pay | Admitting: Family Medicine

## 2018-08-15 MED ORDER — MELOXICAM 15 MG PO TABS: 15.0000 mg | ORAL_TABLET | Freq: Every day | ORAL | 5 refills | Status: DC | PRN

## 2018-08-15 NOTE — Telephone Encounter (Signed)
Last office visit 04/26/2018 with Dr. Darnell Level for knee pain.  Last refilled 12/20/2017 for #30 with 3 refills.  No future appointments.  Ok to refill?

## 2018-12-05 ENCOUNTER — Other Ambulatory Visit: Payer: Self-pay

## 2018-12-05 ENCOUNTER — Encounter: Payer: Self-pay | Admitting: Family Medicine

## 2018-12-05 ENCOUNTER — Ambulatory Visit: Payer: Managed Care, Other (non HMO) | Admitting: Family Medicine

## 2018-12-05 ENCOUNTER — Telehealth: Payer: Self-pay | Admitting: Family Medicine

## 2018-12-05 ENCOUNTER — Ambulatory Visit (INDEPENDENT_AMBULATORY_CARE_PROVIDER_SITE_OTHER)
Admission: RE | Admit: 2018-12-05 | Discharge: 2018-12-05 | Disposition: A | Discharge status: Home / Self Care | Payer: Managed Care, Other (non HMO) | Source: Ambulatory Visit | Attending: Family Medicine | Admitting: Family Medicine

## 2018-12-05 VITALS — BP 114/72 | HR 95 | Temp 98.0°F | Ht 64.0 in | Wt 244.1 lb

## 2018-12-05 DIAGNOSIS — M79674 Pain in right toe(s): Secondary | ICD-10-CM

## 2018-12-05 DIAGNOSIS — Z23 Encounter for immunization: Secondary | ICD-10-CM | POA: Diagnosis not present

## 2018-12-05 NOTE — Telephone Encounter (Signed)
-----   Message from Kari Rice, [place] sent at 12/05/2018  4:47 PM EDT ----- Pt viewed results on mychart. She did agree with podiatry referral. I advised pt our [facility] will call to schedule appt

## 2018-12-05 NOTE — Progress Notes (Signed)
Subjective:    Patient ID: Kari Rice, female    DOB: 10-18-1975, 43 y.o.   MRN: [id number]  HPI  Pt presents for sharp pain in R foot   [place]  Started hurting then  [place] needle like pain  Comes and goes -depending on activity  Then hurt all night last night   Hurts to walk  Pressing the brake pedal  Pain in the great toe joint  No warmth  No redness  Not sensitive to the touch  Wearing athletic shoes   No h/o arthritis in this toe   (has it in other areas)   Father just died this month  Lung cancer   She still takes meloxicam Tried tylenol for toe-not helpful   Patient Active Problem List   Diagnosis Date Noted  . Great toe pain, right 12/05/2018  . Left anterior knee pain 04/26/2018  . Acute sinusitis 02/28/2018  . Injury of left toe 01/25/2018  . Abrasion of great toe 01/11/2018  . Left hand paresthesia 11/22/2017  . GERD (gastroesophageal reflux disease) 11/22/2017  . H/O vaginitis 08/10/2017  . Bell's palsy 07/07/2017  . Headache 07/07/2017  . Phlebitis and thrombophlebitis of other deep vessels of unspecified lower extremity ([place]) 05/28/2017  . Hair loss 03/25/2016  . Vitamin D deficiency 01/15/2016  . Routine general medical examination at a health care facility 05/29/2014  . Encounter for routine gynecological examination 05/29/2014  . Screening for HIV (human immunodeficiency virus) 05/29/2014  . Encounter for screening mammogram for breast cancer 05/29/2014  . Morbid obesity ([place]) 05/29/2014  . Joint pain 04/10/2014  . History of DVT (deep vein thrombosis) 07/06/2012  . Varicose veins of lower extremities with other complications [id number]  . Unspecified venous (peripheral) insufficiency 12/23/2011  . Venous insufficiency 11/25/2011  . Phlebitis and thrombophlebitis of the leg 11/06/2011  . Venous insufficiency, peripheral 10/26/2011  . Pedal edema 10/26/2011  . IRREGULAR MENSES 02/29/2008  . 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 Arthritis    right hand  . DVT (deep venous thrombosis) ([place]) 2013  . PONV (postoperative nausea and vomiting)    after cholecystectomy  . Sleep apnea    CPAP  . Varicose veins    Past Surgical History:  Procedure Laterality Date  . CHOLECYSTECTOMY  Feb. 2006   Gall Bladder  . HAND SURGERY  2016  . NASAL SEPTOPLASTY W/ TURBINOPLASTY Bilateral 10/01/2017   Procedure: NASAL SEPTOPLASTY WITH SUBMUCOCELE RESECTION OF TURBINATE;  Surgeon: Beverly Gust, MD;  Location: [facility];  Service: ENT;  Laterality: Bilateral;  Sleep apnea  . THROMBECTOMY Right 01/13/12 and  01/21/12   Right iliac vein stent and Vein thrombosis   Social History   Tobacco Use  . Smoking status: Never Smoker  . Smokeless tobacco: Never Used  Substance Use Topics  . Alcohol use: No    Alcohol/week: 0.0 standard drinks  . Drug use: No   Family History  Problem Relation Age of Onset  . Arthritis Mother   . Cancer Mother        uterine   . Heart disease Father        Heart Disease before age 90  . Cancer Father        skin   . Hypertension Father   . Heart attack Father    Allergies  Allergen Reactions  . Penicillins Rash  . Sulfa Antibiotics Rash   Current Outpatient Medications  on File Prior to Visit  Medication Sig Dispense Refill  . Cholecalciferol (VITAMIN D3) 2000 units TABS Take 1 tablet by mouth daily.    . meloxicam (MOBIC) 15 MG tablet Take 1 tablet (15 mg total) by mouth daily as needed for pain. Take with food 30 tablet 5  . NON FORMULARY Support hose to the waist for dx of venous insufficiency and edema 15-20 mm Hg    . pantoprazole (PROTONIX) 20 MG tablet Take 1 tablet (20 mg total) by mouth daily. 30 tablet 11   No current facility-administered medications on file prior to visit.     Review of Systems  Constitutional: Negative for activity change, appetite change, fatigue, fever and unexpected weight change.   HENT: Negative for congestion, ear pain, rhinorrhea, sinus pressure and sore throat.   Eyes: Negative for pain, redness and visual disturbance.  Respiratory: Negative for cough, shortness of breath and wheezing.   Cardiovascular: Negative for chest pain and palpitations.  Gastrointestinal: Negative for abdominal pain, blood in stool, constipation and diarrhea.  Endocrine: Negative for polydipsia and polyuria.  Genitourinary: Negative for dysuria, frequency and urgency.  Musculoskeletal: Positive for arthralgias. Negative for back pain and myalgias.       R great toe joint is painful to bear weight  Skin: Negative for pallor and rash.  Allergic/Immunologic: Negative for environmental allergies.  Neurological: Negative for dizziness, syncope and headaches.  Hematological: Negative for adenopathy. Does not bruise/bleed easily.  Psychiatric/Behavioral: Negative for decreased concentration and dysphoric mood. The patient is not nervous/anxious.        Objective:   Physical Exam Constitutional:      General: She is not in acute distress.    Appearance: Normal appearance. She is obese. She is not ill-appearing.  HENT:     Mouth/Throat:     Mouth: Mucous membranes are moist.  Eyes:     Conjunctiva/sclera: Conjunctivae normal.     Pupils: Pupils are equal, round, and reactive to light.  Neck:     Musculoskeletal: Normal range of motion and neck supple.  Cardiovascular:     Rate and Rhythm: Regular rhythm. Tachycardia present.  Pulmonary:     Effort: Pulmonary effort is normal. No respiratory distress.  Musculoskeletal:        General: Tenderness present. No swelling or deformity.     Comments: Tenderness of R first MTP joint Nl rom and no crepitus No swelling/warmth /erythema  Pain with direct palpation of joint (not exquisitely tender)  Nl perfusion and sensation   Lymphadenopathy:     Cervical: No cervical adenopathy.  Skin:    General: Skin is warm and dry.     Coloration:  Skin is not pale.     Findings: No erythema or rash.  Neurological:     Mental Status: She is alert.     Sensory: No sensory deficit.     Coordination: Coordination normal.  Psychiatric:        Mood and Affect: Mood normal.           Assessment & Plan:   Problem List Items Addressed This Visit      Other   Great toe pain, right    Suspect due to degenerative change of first MTP joint / hallux rigidus  Pt given handout  Enc use of ice/heat Continues meloxicam  Acetaminophen prn  Supportive shoes/relative rest when needed  Xray today -then plan with rad rev       Relevant Orders   DG Foot  Complete Right (Completed)    Other Visit Diagnoses    Need for influenza vaccination    -  Primary   Relevant Orders   Flu Vaccine QUAD 6+ mos PF IM (Fluarix Quad PF) (Completed)

## 2018-12-05 NOTE — Patient Instructions (Signed)
Try ice or heat on toe  Wear very supportive shoes  Avoid standing on your toes   Continue meloxicam Tylenol if it helps   Xray now  We will call you with result and plan

## 2018-12-05 NOTE — Telephone Encounter (Signed)
Ref done Will send to [facility] 

## 2018-12-05 NOTE — Assessment & Plan Note (Addendum)
Suspect due to degenerative change of first MTP joint / hallux rigidus  Pt given handout  Enc use of ice/heat Continues meloxicam  Acetaminophen prn  Supportive shoes/relative rest when needed  Xray today -then plan with rad rev

## 2018-12-05 NOTE — Assessment & Plan Note (Signed)
Enc wt loss -which may help foot pain  Discussed how this problem influences overall health and the risks it imposes  Reviewed plan for weight loss with lower calorie diet (via better food choices and also portion control or program like weight watchers) and exercise building up to or more than 30 minutes 5 days per week including some aerobic activity

## 2018-12-13 ENCOUNTER — Encounter: Payer: Self-pay | Admitting: Podiatry

## 2018-12-13 ENCOUNTER — Other Ambulatory Visit: Payer: Self-pay

## 2018-12-13 ENCOUNTER — Ambulatory Visit: Payer: Managed Care, Other (non HMO) | Admitting: Podiatry

## 2018-12-13 DIAGNOSIS — M7751 Other enthesopathy of right foot: Secondary | ICD-10-CM

## 2018-12-13 DIAGNOSIS — B351 Tinea unguium: Secondary | ICD-10-CM

## 2018-12-13 MED ORDER — METHYLPREDNISOLONE 4 MG PO TBPK: ORAL_TABLET | ORAL | 0 refills | Status: DC

## 2018-12-13 MED ORDER — TERBINAFINE HCL 250 MG PO TABS: 250.0000 mg | ORAL_TABLET | Freq: Every day | ORAL | 0 refills | Status: DC

## 2018-12-15 NOTE — Progress Notes (Signed)
   Subjective: 43 y.o. female presenting today as a new patient with a chief complaint of intermittent pain to the 1st MPJ of the right foot that began 3-4 weeks ago. She also states her right great toenail was removed 3 years ago and has grown back thick again. Walking increases the joint pain which she has been taking Tylenol and Meloxicam for treatment. She has not done anything to treat the nail. Patient is here for further evaluation and treatment.   Past Medical History:  Diagnosis Date  . Arthritis    right hand  . DVT (deep venous thrombosis) ([place]) 2013  . PONV (postoperative nausea and vomiting)    after cholecystectomy  . Sleep apnea    CPAP  . Varicose veins     Objective: Physical Exam General: The patient is alert and oriented x3 in no acute distress.  Dermatology: Hyperkeratotic, discolored, thickened, onychodystrophy noted to the right great toenail. Skin is warm, dry and supple bilateral lower extremities. Negative for open lesions or macerations.  Vascular: Palpable pedal pulses bilaterally. No edema or erythema noted. Capillary refill within normal limits.  Neurological: Epicritic and protective threshold grossly intact bilaterally.   Musculoskeletal Exam: Pain with palpation noted to the 1st MPJ of the right foot. Range of motion within normal limits to all pedal and ankle joints bilateral. Muscle strength 5/5 in all groups bilateral.   Assessment: #1 Onychomycosis right great toenail  #2 Hyperkeratotic nails right great toenail  #3 1st MPJ capsulitis right   Plan of Care:  #1 Patient was evaluated. X-Rays in Epic from 12/05/2018 reviewed.  #2 Prescription for Medrol Dose Pak provided to patient. Then continue taking Meloxicam.  #3 Declined injections.  #4 Prescription for Lamisil 250 mg #90 provided to patient.  #5 Return to clinic as needed.    Edrick Kins, DPM [facility]  Dr. Edrick Kins, [place]                                         [place], [place] [address]                Office [phone]  Fax [phone]

## 2019-05-30 ENCOUNTER — Ambulatory Visit (INDEPENDENT_AMBULATORY_CARE_PROVIDER_SITE_OTHER): Payer: Managed Care, Other (non HMO) | Admitting: Family Medicine

## 2019-05-30 ENCOUNTER — Ambulatory Visit: Payer: Managed Care, Other (non HMO) | Attending: Internal Medicine

## 2019-05-30 ENCOUNTER — Encounter: Payer: Self-pay | Admitting: Family Medicine

## 2019-05-30 DIAGNOSIS — Z20822 Contact with and (suspected) exposure to covid-19: Secondary | ICD-10-CM

## 2019-05-30 DIAGNOSIS — J069 Acute upper respiratory infection, unspecified: Secondary | ICD-10-CM | POA: Diagnosis not present

## 2019-05-30 NOTE — Patient Instructions (Addendum)
Drink fluids and rest Get tested for covid  Isolate until test returns and you feel better  I will write a work note   Acetaminophen for pain or fever  mucinex for congestion  Antihistamine of choice for runny nose of sneezing   Update if not starting to improve in a week or if worsening    Update [place] when you get a test result

## 2019-05-30 NOTE — Progress Notes (Signed)
Virtual Visit via Video Note  I connected with Kari Rice on 05/30/19 at 10:45 AM EST by a video enabled telemedicine application and verified that I am speaking with the correct person using two identifiers.  Location: Patient: home Provider: office   I discussed the limitations of evaluation and management by telemedicine and the availability of in person appointments. The patient expressed understanding and agreed to proceed.  Parties involved in encounter  Patient: Kari Rice  Provider:  Loura Pardon MD    History of Present Illness: Thinks she has a sinus infection   Started symptoms this am  Had to leave work early Pain in cheeks Runny nose with congestion  Clear d/c to just a little yellow  Sneezing   ST this am- better now  Ears - feel ok   Cough - mild/ dry  No wheezing  No sob   Temp -none 98.0  No chills or body aches   No loss of taste or smell   No GI symptoms at all   No allergy problems   Otc: nothing  She will usually get mucinex     Patient Active Problem List   Diagnosis Date Noted  . Viral URI with cough 05/30/2019  . Great toe pain, right 12/05/2018  . Left anterior knee pain 04/26/2018  . Acute sinusitis 02/28/2018  . Injury of left toe 01/25/2018  . Abrasion of great toe 01/11/2018  . Left hand paresthesia 11/22/2017  . GERD (gastroesophageal reflux disease) 11/22/2017  . H/O vaginitis 08/10/2017  . Bell's palsy 07/07/2017  . Headache 07/07/2017  . Hair loss 03/25/2016  . Vitamin D deficiency 01/15/2016  . Routine general medical examination at a health care facility 05/29/2014  . Encounter for routine gynecological examination 05/29/2014  . Screening for HIV (human immunodeficiency virus) 05/29/2014  . Encounter for screening mammogram for breast cancer 05/29/2014  . Morbid obesity ([place]) 05/29/2014  . Joint pain 04/10/2014  . History of DVT (deep vein thrombosis) 07/06/2012  . Varicose veins of lower extremities with  other complications [id number]  . Unspecified venous (peripheral) insufficiency 12/23/2011  . Venous insufficiency 11/25/2011  . Phlebitis and thrombophlebitis of the leg 11/06/2011  . Venous insufficiency, peripheral 10/26/2011  . Pedal edema 10/26/2011  . IRREGULAR MENSES 02/29/2008  . 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 Arthritis    right hand  . DVT (deep venous thrombosis) ([place]) 2013  . PONV (postoperative nausea and vomiting)    after cholecystectomy  . Sleep apnea    CPAP  . Varicose veins    Past Surgical History:  Procedure Laterality Date  . CHOLECYSTECTOMY  Feb. 2006   Gall Bladder  . HAND SURGERY  2016  . NASAL SEPTOPLASTY W/ TURBINOPLASTY Bilateral 10/01/2017   Procedure: NASAL SEPTOPLASTY WITH SUBMUCOCELE RESECTION OF TURBINATE;  Surgeon: Beverly Gust, MD;  Location: [facility];  Service: ENT;  Laterality: Bilateral;  Sleep apnea  . THROMBECTOMY Right 01/13/12 and  01/21/12   Right iliac vein stent and Vein thrombosis   Social History   Tobacco Use  . Smoking status: Never Smoker  . Smokeless tobacco: Never Used  Substance Use Topics  . Alcohol use: No    Alcohol/week: 0.0 standard drinks  . Drug use: No   Family History  Problem Relation Age of Onset  . Arthritis Mother   . Cancer Mother        uterine   . Heart disease Father  Heart Disease before age 61  . Cancer Father        skin   . Hypertension Father   . Heart attack Father    Allergies  Allergen Reactions  . Penicillins Rash  . Sulfa Antibiotics Rash   Current Outpatient Medications on File Prior to Visit  Medication Sig Dispense Refill  . Cholecalciferol (VITAMIN D3) 2000 units TABS Take 1 tablet by mouth daily.    . NON FORMULARY Support hose to the waist for dx of venous insufficiency and edema 15-20 mm Hg    . meloxicam (MOBIC) 15 MG tablet Take 1 tablet (15 mg total) by mouth daily as needed for pain. Take with food  (Patient not taking: Reported on 05/30/2019) 30 tablet 5   No current facility-administered medications on file prior to visit.   Review of Systems  Constitutional: Negative for chills, fever and malaise/fatigue.  HENT: Positive for congestion, sinus pain and sore throat. Negative for ear pain and nosebleeds.   Eyes: Negative for blurred vision, discharge and redness.  Respiratory: Positive for cough. Negative for sputum production, shortness of breath, wheezing and stridor.   Cardiovascular: Negative for chest pain, palpitations and leg swelling.  Gastrointestinal: Negative for abdominal pain, diarrhea, nausea and vomiting.  Musculoskeletal: Negative for myalgias.  Skin: Negative for rash.  Neurological: Positive for headaches. Negative for dizziness.    Observations/Objective: Patient appears well, in no distress Weight is baseline  No facial swelling or asymmetry Normal voice-not hoarse and no slurred speech (she does sound mildly congested)  Pt points to area around her eyes to indicate pressure spots  No obvious tremor or mobility impairment Moving neck and UEs normally Able to hear the call well  No cough or shortness of breath during interview  Talkative and mentally sharp with no cognitive changes No skin changes on face or neck , no rash or pallor Affect is normal    Assessment and Plan: Problem List Items Addressed This Visit      Respiratory   Viral URI with cough    Mild w/o fever  Given info to get a covid test and inst to isolate until neg result (will call us) and symptoms are better  sympt care-rest/fluids/expectorant prn/ acetaminophen for pain or fever Antihistamine for sneeze/runny nose Update if not starting to improve in a week or if worsening   Also update if new symptoms  Will continue to check temp          Follow Up Instructions: Drink fluids and rest Get tested for covid  Isolate until test returns and you feel better  I will write a work  note   Acetaminophen for pain or fever  mucinex for congestion  Antihistamine of choice for runny nose of sneezing   Update if not starting to improve in a week or if worsening    Update [place] when you get a test result    I discussed the assessment and treatment plan with the patient. The patient was provided an opportunity to ask questions and all were answered. The patient agreed with the plan and demonstrated an understanding of the instructions.   The patient was advised to call back or seek an in-person evaluation if the symptoms worsen or if the condition fails to improve as anticipated.    Loura Pardon, MD

## 2019-05-30 NOTE — Assessment & Plan Note (Signed)
Mild w/o fever  Given info to get a covid test and inst to isolate until neg result (will call us) and symptoms are better  sympt care-rest/fluids/expectorant prn/ acetaminophen for pain or fever Antihistamine for sneeze/runny nose Update if not starting to improve in a week or if worsening   Also update if new symptoms  Will continue to check temp

## 2019-05-31 LAB — NOVEL CORONAVIRUS, NAA: SARS-CoV-2, NAA: NOT DETECTED

## 2019-06-02 ENCOUNTER — Encounter: Payer: Self-pay | Admitting: Family Medicine

## 2019-06-02 MED ORDER — DOXYCYCLINE HYCLATE 100 MG PO TABS: 100.0000 mg | ORAL_TABLET | Freq: Two times a day (BID) | ORAL | 0 refills | Status: DC

## 2019-06-02 NOTE — Telephone Encounter (Signed)
I pended a px to send for possible bacterial sinusitis Unsure what pharmacy  Please find out from her and send it  Thanks F/u if no improvement

## 2019-06-02 NOTE — Telephone Encounter (Signed)
Pt notified of Dr. Marliss Coots comments and instructions and Rx sent to pt's pharmacy

## 2019-10-11 ENCOUNTER — Other Ambulatory Visit: Payer: Self-pay

## 2019-10-11 ENCOUNTER — Encounter: Payer: Self-pay | Admitting: Family Medicine

## 2019-10-11 ENCOUNTER — Ambulatory Visit (INDEPENDENT_AMBULATORY_CARE_PROVIDER_SITE_OTHER): Payer: Managed Care, Other (non HMO) | Admitting: Family Medicine

## 2019-10-11 VITALS — BP 116/80 | HR 67 | Temp 97.6°F | Wt 228.0 lb

## 2019-10-11 DIAGNOSIS — Z30011 Encounter for initial prescription of contraceptive pills: Secondary | ICD-10-CM | POA: Diagnosis not present

## 2019-10-11 DIAGNOSIS — B351 Tinea unguium: Secondary | ICD-10-CM | POA: Insufficient documentation

## 2019-10-11 DIAGNOSIS — Z309 Encounter for contraceptive management, unspecified: Secondary | ICD-10-CM | POA: Insufficient documentation

## 2019-10-11 MED ORDER — NORETHINDRONE 0.35 MG PO TABS: 1.0000 | ORAL_TABLET | Freq: Every day | ORAL | 11 refills | Status: DC

## 2019-10-11 MED ORDER — TERBINAFINE HCL 250 MG PO TABS: 250.0000 mg | ORAL_TABLET | Freq: Every day | ORAL | 3 refills | Status: DC

## 2019-10-11 NOTE — Assessment & Plan Note (Signed)
Pt is interested in progesterone only tx  micronor px Disc way to take and need to be very compliant with dosing and time inst to update if any problems  mirena iud may also be option in future Would avoid depo provera due to poss of wt gain

## 2019-10-11 NOTE — Patient Instructions (Signed)
Labs in 2 weeks (checking liver function and also pre physical labs)  Then schedule a physical   Start lamasil for 12 weeks for toe nail fungus  Start the progestin contraceptive one daily at the same time   Give it 3 months to regulate period  Start on first day of menstrual cycle

## 2019-10-11 NOTE — Progress Notes (Signed)
Subjective:    Patient ID: Kari Rice, female    DOB: 11/30/1975, 44 y.o.   MRN: [id number]  This visit occurred during the SARS-CoV-2 public health emergency.  Safety protocols were in place, including screening questions prior to the visit, additional usage of staff PPE, and extensive cleaning of exam room while observing appropriate contact time as indicated for disinfecting solutions.    HPI Pt presents to discuss contraception options   Also fungal nail treatment  R great toe nail Took lamasil for a mo from podiatry in 2020 - did not finish  Wants to try again    Wt Readings from Last 3 Encounters:  10/11/19 228 lb (103.4 kg)  12/05/18 244 lb 2 oz (110.7 kg)  04/26/18 233 lb 12 oz (106 kg)  has lost weight  New job is a lot more physical- lifting and walking /makes a big difference  She feels stronger  39.14 kg/m   Eats about the same  Wants to work on better eating   Father died  33 in with mom New job A lot of change  Mood has been down Hair changes as well   Last period was a week early  Has not come yet this month - more irregular than it used to be  Sometimes heavy and crampy  Other times light to medium  Lasts 3-5 days  She does plan to be sexually active upcoming    She is 44 yo  Has remote h/o DVT in the past   Is very compliant with medication and sets alarms if needed   BP Readings from Last 3 Encounters:  10/11/19 116/80  12/05/18 114/72  04/26/18 116/64   Pulse Readings from Last 3 Encounters:  10/11/19 67  12/05/18 95  04/26/18 87    No gyn issues No trich lately    Had gyn exam and pap done here 10/17 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 Fungal nail infection 10/11/2019  . Contraception management 10/11/2019  . Great toe pain, right 12/05/2018  . Left anterior knee  pain 04/26/2018  . Injury of left toe 01/25/2018  . Abrasion of great toe 01/11/2018  . Left hand paresthesia 11/22/2017  . GERD (gastroesophageal reflux disease) 11/22/2017  . H/O vaginitis 08/10/2017  . Bell's palsy 07/07/2017  . Headache 07/07/2017  . Hair loss 03/25/2016  . Vitamin D deficiency 01/15/2016  . Routine general medical examination at a health care facility 05/29/2014  . Encounter for routine gynecological examination 05/29/2014  . Screening for HIV (human immunodeficiency virus) 05/29/2014  . Encounter for screening mammogram for breast cancer 05/29/2014  . Joint pain 04/10/2014  . History of DVT (deep vein thrombosis) 07/06/2012  . Varicose veins of lower extremities with other complications 73/41/9379  . Unspecified venous (peripheral) insufficiency 12/23/2011  . Venous insufficiency 11/25/2011  . Phlebitis and thrombophlebitis of the leg 11/06/2011  . Venous insufficiency, peripheral 10/26/2011  . Pedal edema 10/26/2011  . IRREGULAR MENSES 02/29/2008  . 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 Arthritis    right hand  . DVT (deep venous thrombosis) ([facility]) 2013  . PONV (postoperative nausea and vomiting)    after cholecystectomy  . Sleep apnea    CPAP  . Varicose veins    Past Surgical  History:  Procedure Laterality Date  . CHOLECYSTECTOMY  Feb. 2006   Gall Bladder  . HAND SURGERY  2016  . NASAL SEPTOPLASTY W/ TURBINOPLASTY Bilateral 10/01/2017   Procedure: NASAL SEPTOPLASTY WITH SUBMUCOCELE RESECTION OF TURBINATE;  Surgeon: Beverly Gust, MD;  Location: [facility];  Service: ENT;  Laterality: Bilateral;  Sleep apnea  . THROMBECTOMY Right 01/13/12 and  01/21/12   Right iliac vein stent and Vein thrombosis   Social History   Tobacco Use  . Smoking status: Never Smoker  . Smokeless tobacco: Never Used  Vaping Use  . Vaping Use: Never used  Substance Use Topics  . Alcohol use: No    Alcohol/week: 0.0  standard drinks  . Drug use: No   Family History  Problem Relation Age of Onset  . Arthritis Mother   . Cancer Mother        uterine   . Heart disease Father        Heart Disease before age 42  . Cancer Father        skin   . Hypertension Father   . Heart attack Father    Allergies  Allergen Reactions  . Penicillins Rash  . Sulfa Antibiotics Rash   Current Outpatient Medications on File Prior to Visit  Medication Sig Dispense Refill  . Cholecalciferol (VITAMIN D3) 2000 units TABS Take 1 tablet by mouth daily.    . meloxicam (MOBIC) 15 MG tablet Take 1 tablet (15 mg total) by mouth daily as needed for pain. Take with food 30 tablet 5  . NON FORMULARY Support hose to the waist for dx of venous insufficiency and edema 15-20 mm Hg     No current facility-administered medications on file prior to visit.    Review of Systems  Constitutional: Negative for activity change, appetite change, fatigue, fever and unexpected weight change.  HENT: Negative for congestion, ear pain, rhinorrhea, sinus pressure and sore throat.   Eyes: Negative for pain, redness and visual disturbance.  Respiratory: Negative for cough, shortness of breath and wheezing.   Cardiovascular: Negative for chest pain and palpitations.  Gastrointestinal: Negative for abdominal pain, blood in stool, constipation and diarrhea.  Endocrine: Negative for polydipsia and polyuria.  Genitourinary: Negative for dysuria, frequency and urgency.  Musculoskeletal: Negative for arthralgias, back pain and myalgias.  Skin: Negative for pallor and rash.       Thinning hair  Fungal toe nail  Allergic/Immunologic: Negative for environmental allergies.  Neurological: Negative for dizziness, syncope and headaches.  Hematological: Negative for adenopathy. Does not bruise/bleed easily.  Psychiatric/Behavioral: Negative for decreased concentration and dysphoric mood. The patient is not nervous/anxious.        Objective:   Physical  Exam Constitutional:      General: She is not in acute distress.    Appearance: Normal appearance. She is well-developed. She is obese. She is not ill-appearing or diaphoretic.  HENT:     Head: Normocephalic and atraumatic.  Eyes:     Conjunctiva/sclera: Conjunctivae normal.     Pupils: Pupils are equal, round, and reactive to light.  Neck:     Thyroid: No thyromegaly.     Vascular: No carotid bruit or JVD.  Cardiovascular:     Rate and Rhythm: Normal rate and regular rhythm.     Heart sounds: Normal heart sounds. No gallop.   Pulmonary:     Effort: Pulmonary effort is normal. No respiratory distress.     Breath sounds: Normal breath sounds. No wheezing or  rales.  Abdominal:     General: Bowel sounds are normal. There is no distension or abdominal bruit.     Palpations: Abdomen is soft. There is no mass.     Tenderness: There is no abdominal tenderness.     Comments: No suprapubic tenderness or fullness    Musculoskeletal:     Cervical back: Normal range of motion and neck supple.  Lymphadenopathy:     Cervical: No cervical adenopathy.  Skin:    General: Skin is warm and dry.     Coloration: Skin is not pale.     Findings: No erythema or rash.     Comments: R great toe nail is thickened and crumbly with yellow discoloration  No signs of athelete's foot   Neurological:     Mental Status: She is alert.     Coordination: Coordination normal.     Deep Tendon Reflexes: Reflexes are normal and symmetric. Reflexes normal.  Psychiatric:        Mood and Affect: Mood normal.           Assessment & Plan:   Problem List Items Addressed This Visit      Musculoskeletal and Integument   Fungal nail infection    Trial of 12 wk course of oral lamasil Check LFT in 2-3 wk Alert if side eff or problems        Relevant Medications   terbinafine (LAMISIL) 250 MG tablet     Other   Contraception management - Primary    Pt is interested in progesterone only tx  micronor  px Disc way to take and need to be very compliant with dosing and time inst to update if any problems  mirena iud may also be option in future Would avoid depo provera due to poss of wt gain

## 2019-10-11 NOTE — Assessment & Plan Note (Signed)
Trial of 12 wk course of oral lamasil Check LFT in 2-3 wk Alert if side eff or problems

## 2019-11-05 ENCOUNTER — Telehealth: Payer: Self-pay | Admitting: Family Medicine

## 2019-11-05 DIAGNOSIS — Z0000 Encounter for general adult medical examination without abnormal findings: Secondary | ICD-10-CM

## 2019-11-05 DIAGNOSIS — E559 Vitamin D deficiency, unspecified: Secondary | ICD-10-CM

## 2019-11-05 NOTE — Telephone Encounter (Signed)
-----   Message from Ellamae Sia sent at 10/24/2019 11:29 AM EDT ----- Regarding: Lab orders for Monday, 7.26.21 Patient is scheduled for CPX labs, please order future labs, Thanks , Karna Christmas

## 2019-11-06 ENCOUNTER — Other Ambulatory Visit (INDEPENDENT_AMBULATORY_CARE_PROVIDER_SITE_OTHER): Payer: Managed Care, Other (non HMO)

## 2019-11-06 ENCOUNTER — Other Ambulatory Visit: Payer: Self-pay

## 2019-11-06 DIAGNOSIS — Z0000 Encounter for general adult medical examination without abnormal findings: Secondary | ICD-10-CM

## 2019-11-06 DIAGNOSIS — E559 Vitamin D deficiency, unspecified: Secondary | ICD-10-CM

## 2019-11-06 NOTE — Addendum Note (Signed)
Addended by: Cloyd Stagers on: 11/06/2019 08:38 AM   Modules accepted: Orders

## 2019-11-07 LAB — COMPREHENSIVE METABOLIC PANEL
ALT: 17 IU/L (ref 0–32)
AST: 19 IU/L (ref 0–40)
Albumin/Globulin Ratio: 1.8 (ref 1.2–2.2)
Albumin: 4.1 g/dL (ref 3.8–4.8)
Alkaline Phosphatase: 94 IU/L (ref 48–121)
BUN/Creatinine Ratio: 16 (ref 9–23)
BUN: 10 mg/dL (ref 6–24)
Bilirubin Total: 0.4 mg/dL (ref 0.0–1.2)
CO2: 24 mmol/L (ref 20–29)
Calcium: 9.1 mg/dL (ref 8.7–10.2)
Chloride: 103 mmol/L (ref 96–106)
Creatinine, Ser: 0.64 mg/dL (ref 0.57–1.00)
GFR calc Af Amer: 124 mL/min/{1.73_m2} (ref >59)
GFR calc non Af Amer: 107 mL/min/{1.73_m2} (ref >59)
Globulin, Total: 2.3 g/dL (ref 1.5–4.5)
Glucose: 85 mg/dL (ref 65–99)
Potassium: 4.4 mmol/L (ref 3.5–5.2)
Sodium: 139 mmol/L (ref 134–144)
Total Protein: 6.4 g/dL (ref 6.0–8.5)

## 2019-11-07 LAB — LIPID PANEL
Chol/HDL Ratio: 2.9 ratio (ref 0.0–4.4)
Cholesterol, Total: 150 mg/dL (ref 100–199)
HDL: 51 mg/dL (ref >39)
LDL Chol Calc (NIH): 87 mg/dL (ref 0–99)
Triglycerides: 58 mg/dL (ref 0–149)
VLDL Cholesterol Cal: 12 mg/dL (ref 5–40)

## 2019-11-07 LAB — CBC WITH DIFFERENTIAL/PLATELET
Basophils Absolute: 0.1 10*3/uL (ref 0.0–0.2)
Basos: 1 %
EOS (ABSOLUTE): 0.2 10*3/uL (ref 0.0–0.4)
Eos: 3 %
Hematocrit: 40.4 % (ref 34.0–46.6)
Hemoglobin: 13.3 g/dL (ref 11.1–15.9)
Immature Grans (Abs): 0 10*3/uL (ref 0.0–0.1)
Immature Granulocytes: 1 %
Lymphocytes Absolute: 2.9 10*3/uL (ref 0.7–3.1)
Lymphs: 32 %
MCH: 28.4 pg (ref 26.6–33.0)
MCHC: 32.9 g/dL (ref 31.5–35.7)
MCV: 86 fL (ref 79–97)
Monocytes Absolute: 0.6 10*3/uL (ref 0.1–0.9)
Monocytes: 6 %
Neutrophils Absolute: 5.1 10*3/uL (ref 1.4–7.0)
Neutrophils: 57 %
Platelets: 312 10*3/uL (ref 150–450)
RBC: 4.69 x10E6/uL (ref 3.77–5.28)
RDW: 13.9 % (ref 11.7–15.4)
WBC: 8.9 10*3/uL (ref 3.4–10.8)

## 2019-11-07 LAB — TSH: TSH: 2.13 u[IU]/mL (ref 0.450–4.500)

## 2019-11-07 LAB — VITAMIN D 25 HYDROXY (VIT D DEFICIENCY, FRACTURES): Vit D, 25-Hydroxy: 31 ng/mL (ref 30.0–100.0)

## 2019-11-13 ENCOUNTER — Ambulatory Visit (INDEPENDENT_AMBULATORY_CARE_PROVIDER_SITE_OTHER): Payer: Managed Care, Other (non HMO) | Admitting: Family Medicine

## 2019-11-13 ENCOUNTER — Encounter: Payer: Self-pay | Admitting: Family Medicine

## 2019-11-13 ENCOUNTER — Other Ambulatory Visit: Payer: Self-pay

## 2019-11-13 VITALS — BP 136/76 | HR 59 | Temp 96.9°F | Ht 64.0 in | Wt 236.0 lb

## 2019-11-13 DIAGNOSIS — Z0000 Encounter for general adult medical examination without abnormal findings: Secondary | ICD-10-CM | POA: Diagnosis not present

## 2019-11-13 DIAGNOSIS — E559 Vitamin D deficiency, unspecified: Secondary | ICD-10-CM | POA: Diagnosis not present

## 2019-11-13 DIAGNOSIS — B351 Tinea unguium: Secondary | ICD-10-CM | POA: Diagnosis not present

## 2019-11-13 NOTE — Patient Instructions (Addendum)
Don't forget to [facility]  Here is the number   Get a flu shot in the fall  Stay safe   Labs are stable   Keep working on healthy diet and exercise   Wear sunscreen  Keep sunburned areas clean with soap and water

## 2019-11-13 NOTE — Assessment & Plan Note (Signed)
Pt continues lamasil

## 2019-11-13 NOTE — Assessment & Plan Note (Signed)
Reviewed health habits including diet and exercise and skin cancer prevention Reviewed appropriate screening tests for age  Also reviewed health mt list, fam hx and immunization status , as well as social and family history   See HPI  Pt plans to schedule her mammogram  Menses today (perimenopause) will do pap next time Discussed imp of sun protection (has sunburn today) Planning flu shot in the fall Is covid immunized  Labs stable including cholesterol

## 2019-11-13 NOTE — Assessment & Plan Note (Signed)
Vitamin D level is therapeutic with current supplementation Disc importance of this to bone and overall health  Level of 31 Taking 2000 iu daily

## 2019-11-13 NOTE — Progress Notes (Signed)
Subjective:    Patient ID: Kari Rice, female    DOB: [birth year], 44 y.o.   MRN: [id number]  This visit occurred during the SARS-CoV-2 public health emergency.  Safety protocols were in place, including screening questions prior to the visit, additional usage of staff PPE, and extensive cleaning of exam room while observing appropriate contact time as indicated for disinfecting solutions.    HPI  Here for health maintenance exam and to review chronic medical problems    Wt Readings from Last 3 Encounters:  11/13/19 (!) 236 lb (107 kg)  10/11/19 228 lb (103.4 kg)  12/05/18 244 lb 2 oz (110.7 kg)   40.51 kg/m   Taking care of herself  Lax -for diet /eating more due to stress ups and downs with her mother  Exercise- walking /cardio   Had a sunburn at the beach-still peeling  Still red on R lower leg  Used sunscreen on arms/chest  Forgot lower legs and back   No h/o skin cancer    Had her covid vaccines   Hep C screen -declines/low risk  HIV screen neg 2016    Tdap 4/19  Flu shot-a year ago  Gets flu shots in the fall   Mammogram 5/19 - is due for one  Self breast exam   Pap 10/17 -neg Has menses today -will wait a y for next pap Skipped last month - ? Less regular as closer to menopause  On micronor for contraception (works out well) - good about taking it  No estrogen due to past h/o clot   No issues with trich-was chronic in the past  No new sexual partners    Vit D def Level is 31 today  Takes 2000 iu daily   Taking lamasil for fungal nail  Lab Results  Component Value Date   ALT 17 11/06/2019   AST 19 11/06/2019   ALKPHOS 94 11/06/2019   BILITOT 0.4 11/06/2019  is helping L foot  R foot-may not respond    cholesterol Lab Results  Component Value Date   CHOL 150 11/06/2019   CHOL 167 07/16/2017   CHOL 178 01/14/2016   Lab Results  Component Value Date   HDL 51 11/06/2019   HDL 69 07/16/2017   HDL 58 01/14/2016   Lab Results    Component Value Date   LDLCALC 87 11/06/2019   LDLCALC 78 07/16/2017   LDLCALC 99 01/14/2016   Lab Results  Component Value Date   TRIG 58 11/06/2019   TRIG 98 07/16/2017   TRIG 104 01/14/2016   Lab Results  Component Value Date   CHOLHDL 2.9 11/06/2019   CHOLHDL 2.4 07/16/2017   CHOLHDL 3.1 01/14/2016   No results found for: LDLDIRECT Diet has been a bit off Still a good profile   Other labs Results for orders placed or performed in visit on 11/06/19  VITAMIN D 25 Hydroxy (Vit-D Deficiency, Fractures)  Result Value Ref Range   Vit D, 25-Hydroxy 31.0 30.0 - 100.0 ng/mL  TSH  Result Value Ref Range   TSH 2.130 0.450 - 4.500 uIU/mL  Lipid panel  Result Value Ref Range   Cholesterol, Total 150 100 - 199 mg/dL   Triglycerides 58 0 - 149 mg/dL   HDL 51 >39 mg/dL   VLDL Cholesterol Cal 12 5 - 40 mg/dL   LDL Chol Calc (NIH) 87 0 - 99 mg/dL   Chol/HDL Ratio 2.9 0.0 - 4.4 ratio  CBC with Differential/Platelet  Result Value  Ref Range   WBC 8.9 3.4 - 10.8 x10E3/uL   RBC 4.69 3.77 - 5.28 x10E6/uL   Hemoglobin 13.3 11.1 - 15.9 g/dL   Hematocrit 40.4 34.0 - 46.6 %   MCV 86 79 - 97 fL   MCH 28.4 26.6 - 33.0 pg   MCHC 32.9 31 - 35 g/dL   RDW 13.9 11.7 - 15.4 %   Platelets 312 150 - 450 x10E3/uL   Neutrophils 57 Not Estab. %   Lymphs 32 Not Estab. %   Monocytes 6 Not Estab. %   Eos 3 Not Estab. %   Basos 1 Not Estab. %   Neutrophils Absolute 5.1 1 - 7 x10E3/uL   Lymphocytes Absolute 2.9 0 - 3 x10E3/uL   Monocytes Absolute 0.6 0 - 0 x10E3/uL   EOS (ABSOLUTE) 0.2 0.0 - 0.4 x10E3/uL   Basophils Absolute 0.1 0 - 0 x10E3/uL   Immature Granulocytes 1 Not Estab. %   Immature Grans (Abs) 0.0 0.0 - 0.1 x10E3/uL  Comprehensive metabolic panel  Result Value Ref Range   Glucose 85 65 - 99 mg/dL   BUN 10 6 - 24 mg/dL   Creatinine, Ser 0.64 0.57 - 1.00 mg/dL   GFR calc non Af Amer 107 >59 mL/min/1.73   GFR calc Af Amer 124 >59 mL/min/1.73   BUN/Creatinine Ratio 16 9 - 23    Sodium 139 134 - 144 mmol/L   Potassium 4.4 3.5 - 5.2 mmol/L   Chloride 103 96 - 106 mmol/L   CO2 24 20 - 29 mmol/L   Calcium 9.1 8.7 - 10.2 mg/dL   Total Protein 6.4 6.0 - 8.5 g/dL   Albumin 4.1 3.8 - 4.8 g/dL   Globulin, Total 2.3 1.5 - 4.5 g/dL   Albumin/Globulin Ratio 1.8 1.2 - 2.2   Bilirubin Total 0.4 0.0 - 1.2 mg/dL   Alkaline Phosphatase 94 48 - 121 IU/L   AST 19 0 - 40 IU/L   ALT 17 0 - 32 IU/L     Some fatigue from schedule- this drove up her PHQ answer score Mood is quite good  Patient Active Problem List   Diagnosis Date Noted  . Fungal nail infection 10/11/2019  . Contraception management 10/11/2019  . Great toe pain, right 12/05/2018  . Left anterior knee pain 04/26/2018  . Injury of left toe 01/25/2018  . Abrasion of great toe 01/11/2018  . Left hand paresthesia 11/22/2017  . GERD (gastroesophageal reflux disease) 11/22/2017  . H/O vaginitis 08/10/2017  . Bell's palsy 07/07/2017  . Headache 07/07/2017  . Hair loss 03/25/2016  . Vitamin D deficiency 01/15/2016  . Routine general medical examination at a health care facility 05/29/2014  . Encounter for routine gynecological examination 05/29/2014  . Screening for HIV (human immunodeficiency virus) 05/29/2014  . Encounter for screening mammogram for breast cancer 05/29/2014  . Joint pain 04/10/2014  . History of DVT (deep vein thrombosis) 07/06/2012  . Varicose veins of lower extremities with other complications 94/85/4627  . Unspecified venous (peripheral) insufficiency 12/23/2011  . Venous insufficiency 11/25/2011  . Phlebitis and thrombophlebitis of the leg 11/06/2011  . Venous insufficiency, peripheral 10/26/2011  . Pedal edema 10/26/2011  . IRREGULAR MENSES 02/29/2008  . 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 Arthritis    right hand  . DVT (deep venous thrombosis) ([facility]) 2013  . PONV (postoperative nausea and vomiting)    after cholecystectomy  . Sleep apnea      CPAP  .  Varicose veins    Past Surgical History:  Procedure Laterality Date  . CHOLECYSTECTOMY  Feb. 2006   Gall Bladder  . HAND SURGERY  2016  . NASAL SEPTOPLASTY W/ TURBINOPLASTY Bilateral 10/01/2017   Procedure: NASAL SEPTOPLASTY WITH SUBMUCOCELE RESECTION OF TURBINATE;  Surgeon: Beverly Gust, MD;  Location: [facility];  Service: ENT;  Laterality: Bilateral;  Sleep apnea  . THROMBECTOMY Right 01/13/12 and  01/21/12   Right iliac vein stent and Vein thrombosis   Social History   Tobacco Use  . Smoking status: Never Smoker  . Smokeless tobacco: Never Used  Vaping Use  . Vaping Use: Never used  Substance Use Topics  . Alcohol use: No    Alcohol/week: 0.0 standard drinks  . Drug use: No   Family History  Problem Relation Age of Onset  . Arthritis Mother   . Cancer Mother        uterine   . Heart disease Father        Heart Disease before age 30  . Cancer Father        skin   . Hypertension Father   . Heart attack Father    Allergies  Allergen Reactions  . Penicillins Rash  . Sulfa Antibiotics Rash   Current Outpatient Medications on File Prior to Visit  Medication Sig Dispense Refill  . Cholecalciferol (VITAMIN D3) 2000 units TABS Take 1 tablet by mouth daily.    . NON FORMULARY Support hose to the waist for dx of venous insufficiency and edema 15-20 mm Hg    . norethindrone (ORTHO MICRONOR) 0.35 MG tablet Take 1 tablet (0.35 mg total) by mouth daily. 28 tablet 11  . terbinafine (LAMISIL) 250 MG tablet Take 1 tablet (250 mg total) by mouth daily. For 12 weeks 30 tablet 3   No current facility-administered medications on file prior to visit.    Review of Systems  Constitutional: Positive for fatigue. Negative for activity change, appetite change, fever and unexpected weight change.  HENT: Negative for congestion, ear pain, rhinorrhea, sinus pressure and sore throat.   Eyes: Negative for pain, redness and visual disturbance.  Respiratory: Negative  for cough, shortness of breath and wheezing.   Cardiovascular: Negative for chest pain and palpitations.  Gastrointestinal: Negative for abdominal pain, blood in stool, constipation and diarrhea.  Endocrine: Negative for polydipsia and polyuria.  Genitourinary: Negative for dysuria, frequency and urgency.  Musculoskeletal: Negative for arthralgias, back pain and myalgias.  Skin: Negative for pallor and rash.  Allergic/Immunologic: Negative for environmental allergies.  Neurological: Negative for dizziness, syncope and headaches.  Hematological: Negative for adenopathy. Does not bruise/bleed easily.  Psychiatric/Behavioral: Positive for sleep disturbance. Negative for decreased concentration and dysphoric mood. The patient is not nervous/anxious.        Objective:   Physical Exam Constitutional:      General: She is not in acute distress.    Appearance: Normal appearance. She is well-developed. She is obese. She is not ill-appearing or diaphoretic.  HENT:     Head: Normocephalic and atraumatic.     Right Ear: Tympanic membrane, ear canal and external ear normal.     Left Ear: Tympanic membrane, ear canal and external ear normal.     Nose: Nose normal. No congestion.     Mouth/Throat:     Mouth: Mucous membranes are moist.     Pharynx: Oropharynx is clear. No posterior oropharyngeal erythema.  Eyes:     General: No scleral icterus.    Extraocular  Movements: Extraocular movements intact.     Conjunctiva/sclera: Conjunctivae normal.     Pupils: Pupils are equal, round, and reactive to light.  Neck:     Thyroid: No thyromegaly.     Vascular: No carotid bruit or JVD.  Cardiovascular:     Rate and Rhythm: Normal rate and regular rhythm.     Pulses: Normal pulses.     Heart sounds: Normal heart sounds. No gallop.   Pulmonary:     Effort: Pulmonary effort is normal. No respiratory distress.     Breath sounds: Normal breath sounds. No wheezing.     Comments: Good air exch Chest:      Chest wall: No tenderness.  Abdominal:     General: Bowel sounds are normal. There is no distension or abdominal bruit.     Palpations: Abdomen is soft. There is no mass.     Tenderness: There is no abdominal tenderness.     Hernia: No hernia is present.  Genitourinary:    Comments: Breast exam: No mass, nodules, thickening, tenderness, bulging, retraction, inflamation, nipple discharge or skin changes noted.  No axillary or clavicular LA.     Musculoskeletal:        General: No tenderness. Normal range of motion.     Cervical back: Normal range of motion and neck supple. No rigidity. No muscular tenderness.     Right lower leg: No edema.     Left lower leg: No edema.     Comments: LE varicosities stable    Lymphadenopathy:     Cervical: No cervical adenopathy.  Skin:    General: Skin is warm and dry.     Coloration: Skin is not pale.     Findings: No erythema or rash.     Comments: Recent sunburn with diffuse peeling on back and some erythema on R leg-resolving  Solar lentigines diffusely   Neurological:     Mental Status: She is alert. Mental status is at baseline.     Cranial Nerves: No cranial nerve deficit.     Motor: No abnormal muscle tone.     Coordination: Coordination normal.     Gait: Gait normal.     Deep Tendon Reflexes: Reflexes are normal and symmetric. Reflexes normal.  Psychiatric:        Mood and Affect: Mood normal.        Cognition and Memory: Cognition and memory normal.     Comments: Mood is good Admits to fatigue from her schedule            Assessment & Plan:   Problem List Items Addressed This Visit      Musculoskeletal and Integument   Fungal nail infection    Pt continues lamasil        Other   Routine general medical examination at a health care facility - Primary    Reviewed health habits including diet and exercise and skin cancer prevention Reviewed appropriate screening tests for age  Also reviewed health mt list, fam hx and  immunization status , as well as social and family history   See HPI  Pt plans to schedule her mammogram  Menses today (perimenopause) will do pap next time Discussed imp of sun protection (has sunburn today) Planning flu shot in the fall Is covid immunized  Labs stable including cholesterol       Vitamin D deficiency    Vitamin D level is therapeutic with current supplementation Disc importance of this to bone and overall  health  Level of 31 Taking 2000 iu daily

## 2019-11-23 ENCOUNTER — Encounter: Payer: Self-pay | Admitting: Obstetrics and Gynecology

## 2019-11-23 ENCOUNTER — Telehealth: Payer: Self-pay | Admitting: Family Medicine

## 2019-11-23 ENCOUNTER — Ambulatory Visit (INDEPENDENT_AMBULATORY_CARE_PROVIDER_SITE_OTHER): Payer: Managed Care, Other (non HMO) | Admitting: Obstetrics and Gynecology

## 2019-11-23 ENCOUNTER — Other Ambulatory Visit (HOSPITAL_COMMUNITY)
Admission: RE | Admit: 2019-11-23 | Discharge: 2019-11-23 | Disposition: A | Discharge status: Home / Self Care | Payer: Managed Care, Other (non HMO) | Source: Ambulatory Visit | Attending: Obstetrics and Gynecology | Admitting: Obstetrics and Gynecology

## 2019-11-23 ENCOUNTER — Other Ambulatory Visit: Payer: Self-pay

## 2019-11-23 VITALS — BP 120/72 | Ht 64.0 in | Wt 237.6 lb

## 2019-11-23 DIAGNOSIS — N939 Abnormal uterine and vaginal bleeding, unspecified: Secondary | ICD-10-CM

## 2019-11-23 DIAGNOSIS — Z130 Encounter for screening for diseases of the blood and blood-forming organs and certain disorders involving the immune mechanism: Secondary | ICD-10-CM

## 2019-11-23 DIAGNOSIS — Z113 Encounter for screening for infections with a predominantly sexual mode of transmission: Secondary | ICD-10-CM | POA: Diagnosis not present

## 2019-11-23 DIAGNOSIS — D259 Leiomyoma of uterus, unspecified: Secondary | ICD-10-CM

## 2019-11-23 DIAGNOSIS — R5383 Other fatigue: Secondary | ICD-10-CM

## 2019-11-23 DIAGNOSIS — Z124 Encounter for screening for malignant neoplasm of cervix: Secondary | ICD-10-CM | POA: Diagnosis present

## 2019-11-23 DIAGNOSIS — Z01419 Encounter for gynecological examination (general) (routine) without abnormal findings: Secondary | ICD-10-CM | POA: Diagnosis not present

## 2019-11-23 DIAGNOSIS — Z1329 Encounter for screening for other suspected endocrine disorder: Secondary | ICD-10-CM

## 2019-11-23 MED ORDER — NORETHINDRONE ACETATE 5 MG PO TABS: 5.0000 mg | ORAL_TABLET | Freq: Three times a day (TID) | ORAL | 2 refills | Status: DC

## 2019-11-23 NOTE — Telephone Encounter (Signed)
FYI  Patient was triage by the access nurse and was told she need to seen within 4 hours.  After speaking patient, she stated that she had heavy bleeding for the past a couple of days and much heavier this morning. No abdominal pain or cramping. No pelvic pain. No nausea or vomiting. No headache.  Recommended patient to contact her GYN/OB to see if she can be seen today but for patient to keep appointment Dr Carlean Purl on Friday, 11/24/2019.

## 2019-11-23 NOTE — Progress Notes (Signed)
   Patient ID: Kari Rice, female   DOB: [birth year], 44 y.o.   MRN: [id number]  Reason for Consult: Gynecologic Exam (Pt states she has had a period for 2 weeks strong.)   Referred by Abner Greenspan, MD  Subjective:     HPI:  Kari Rice is a 44 y.o. female . She reports that she has been having heavy uterine bleeding for 2 weeks.   Past Medical History:  Diagnosis Date  . Arthritis    right hand  . DVT (deep venous thrombosis) ([place]) 2013  . PONV (postoperative nausea and vomiting)    after cholecystectomy  . Sleep apnea    CPAP  . Varicose veins    Family History  Problem Relation Age of Onset  . Arthritis Mother   . Cancer Mother        uterine   . Heart disease Father        Heart Disease before age 10  . Cancer Father        skin   . Hypertension Father   . Heart attack Father    Past Surgical History:  Procedure Laterality Date  . CHOLECYSTECTOMY  Feb. 2006   Gall Bladder  . HAND SURGERY  2016  . NASAL SEPTOPLASTY W/ TURBINOPLASTY Bilateral 10/01/2017   Procedure: NASAL SEPTOPLASTY WITH SUBMUCOCELE RESECTION OF TURBINATE;  Surgeon: Beverly Gust, MD;  Location: [facility];  Service: ENT;  Laterality: Bilateral;  Sleep apnea  . THROMBECTOMY Right 01/13/12 and  01/21/12   Right iliac vein stent and Vein thrombosis    Short Social History:  Social History   Tobacco Use  . Smoking status: Never Smoker  . Smokeless tobacco: Never Used  Substance Use Topics  . Alcohol use: No    Alcohol/week: 0.0 standard drinks    Allergies  Allergen Reactions  . Penicillins Rash  . Sulfa Antibiotics Rash    Current Outpatient Medications  Medication Sig Dispense Refill  . Cholecalciferol (VITAMIN D3) 2000 units TABS Take 1 tablet by mouth daily.    . NON FORMULARY Support hose to the waist for dx of venous insufficiency and edema 15-20 mm Hg    . norethindrone (ORTHO MICRONOR) 0.35 MG tablet Take 1 tablet (0.35 mg total) by mouth daily. (Patient  not taking: Reported on 11/23/2019) 28 tablet 11  . terbinafine (LAMISIL) 250 MG tablet Take 1 tablet (250 mg total) by mouth daily. For 12 weeks 30 tablet 3   No current facility-administered medications for this visit.    REVIEW OF SYSTEMS      Objective:  Objective   Vitals:   11/23/19 1533  BP: 120/72  Weight: 237 lb 9.6 oz (107.8 kg)  Height: 5\' 4"  (1.626 m)   Body mass index is 40.78 kg/m.  Physical Exam       Assessment/Plan:     44 yo with abnormal uterine bleeding Enlarged fibroid uterus on exam Will start norethindrone 5mg  TID Will need pelvic US Discussed bleeding precautions and reasons to be seen in the ER.   More than 20 minutes were spent face to face with the patient in the room, reviewing the medical record, labs and images, and coordinating care for the patient. The plan of management was discussed in detail and counseling was provided.      Adrian Prows MD [facility], [facility] 11/23/2019 4:37 PM

## 2019-11-23 NOTE — Telephone Encounter (Signed)
Per chart review tab pt does have appt 11/23/19 at 3:30 with Dr Corliss Skains OB GYN. FYI to Dr Glori Bickers.

## 2019-11-23 NOTE — Telephone Encounter (Signed)
[facility] - Client TELEPHONE ADVICE RECORD [facility] Patient Name: Kari Rice Gender: Female DOB: 05/12/1975 Age: 44 Y 80 M 10 D Return Phone Number: [phone] (Primary) Address: City/State/Zip: [place] [address] Client [facility] - Client Client Site [place] - Day Physician Tor Netters- NP Contact Type Call Who Is Calling Patient / Member / Family / Caregiver Call Type Triage / Clinical Relationship To Patient Self Return Phone Number [phone] (Primary) Chief Complaint Weakness, Generalized Reason for Call Symptomatic / Request for [place] states she is experiencing vaginal bleeding and weakness. Translation No Nurse Assessment Nurse: Kari Guard, RN, [name] Date/Time (Eastern Time): 11/23/2019 8:26:39 AM Confirm and document reason for call. If symptomatic, describe symptoms. ---Caller states she is having heavy vaginal bleeding for 2 weeks and weakness. Has the patient had close contact with a person known or suspected to have the novel coronavirus illness OR traveled / lives in area with major community spread (including international travel) in the last 14 days from the onset of symptoms? * If Asymptomatic, screen for exposure and travel within the last 14 days. ---Yes Does the patient have any new or worsening symptoms? ---Yes Will a triage be completed? ---Yes Related visit to physician within the last 2 weeks? ---No Does the PT have any chronic conditions? (i.e. diabetes, asthma, this includes High risk factors for pregnancy, etc.) ---No Is the patient pregnant or possibly pregnant? (Ask all females between the ages of 108-55) ---No Is this a behavioral health or substance abuse call? ---No Guidelines Guideline Title Affirmed Question Affirmed Notes Nurse Date/Time (Eastern Time) Vaginal Bleeding - Abnormal [1] Constant  abdominal pain AND [2] present > 2 hours Conner, RN, [name] 11/23/2019 8:29:12 AM Disp. Time Kari Rice Time) Disposition Final User PLEASE NOTE: All timestamps contained within this report are represented as [place] Standard Time. CONFIDENTIALTY NOTICE: This fax transmission is intended only for the addressee. It contains information that is legally privileged, confidential or otherwise protected from use or disclosure. If you are not the intended recipient, you are strictly prohibited from reviewing, disclosing, copying using or disseminating any of this information or taking any action in reliance on or regarding this information. If you have received this fax in error, please notify us immediately by telephone so that we can arrange for its return to [place]. Phone: [phone], Toll-Free: [phone], Fax: [phone] Page: 2 of 2 Call Id: [id number] 11/23/2019 8:33:21 AM See HCP within 4 Hours (or PCP triage) Yes Kari Guard, RN, Kari Rice Disagree/Comply Comply Caller Understands Yes PreDisposition Call Doctor Care Advice Given Per Guideline SEE HCP WITHIN 4 HOURS (OR PCP TRIAGE): * IF OFFICE WILL BE OPEN: You need to be seen within the next 3 or 4 hours. Call your doctor (or NP/PA) now or as soon as the office opens. CARE ADVICE given per Vaginal Bleeding, Abnormal (Adult) guideline. Referrals Warm transfer to backline

## 2019-11-23 NOTE — Patient Instructions (Signed)

## 2019-11-24 ENCOUNTER — Encounter: Payer: Self-pay | Admitting: Family Medicine

## 2019-11-24 ENCOUNTER — Ambulatory Visit: Payer: Managed Care, Other (non HMO) | Admitting: Family Medicine

## 2019-11-24 VITALS — BP 110/72 | HR 80 | Temp 97.9°F | Wt 236.0 lb

## 2019-11-24 DIAGNOSIS — N938 Other specified abnormal uterine and vaginal bleeding: Secondary | ICD-10-CM | POA: Diagnosis not present

## 2019-11-24 NOTE — Patient Instructions (Addendum)
Good to see you today  You will get notified of lab results, I will forward to Dr. Gilman Schmidt for review and comment  Be sure to drink plenty of liquids- 80-90 ounces a day and eat foods rich in iron such as meats, green leafy vegetables, beans

## 2019-11-24 NOTE — Progress Notes (Signed)
   Subjective:    Patient ID: Kari Rice, female    DOB: 10/22/1975, 44 y.o.   MRN: [id number]  HPI Chief Complaint  Patient presents with  . Follow-up    pt stated--went to GYN yesterday and wanted to do labs here.   This is a 44 year old female, patient of Dr. Dorothea Ogle, who presents today with dysfunctional uterine bleeding.  This has been going on for several weeks.  She saw her gynecologist yesterday who did an exam, ordered labs and provided prescription for norethindrone 5 mg.  Patient reports that she was unable to have her labs drawn due to the lab closing.  She requests lab work to be drawn here today. She reports a little bit of fatigue and very rarely lightheadedness.  She reports good fluid intake and regular diet.   Review of Systems    Per HPI Objective:   Physical Exam Physical Exam  Vitals reviewed. Constitutional: Oriented to person, place, and time. Appears well-developed and well-nourished. Obese.  HENT:  Head: Normocephalic and atraumatic.  Eyes: Conjunctivae are normal.  Neck: Normal range of motion. Neck supple.  Cardiovascular: Normal rate.   Pulmonary/Chest: Effort normal.  Musculoskeletal: Normal range of motion.  Neurological: Alert and oriented to person, place, and time.  Psychiatric: Normal mood and affect. Behavior is normal. Judgment and thought content normal.      BP 110/72   Pulse 80   Temp 97.9 F (36.6 C)   Wt 236 lb (107 kg)   LMP 11/09/2019   SpO2 99%   BMI 40.51 kg/m      Assessment & Plan:  1. DUB (dysfunctional uterine bleeding) -Labs reordered to be drawn here, will send to gynecology when received -Reviewed follow-up precautions - CBC with Differential/Platelet - Vitamin B12 - Iron and TIBC - Ferritin - TSH - T4, free  This visit occurred during the SARS-CoV-2 public health emergency.  Safety protocols were in place, including screening questions prior to the visit, additional usage of staff PPE, and extensive  cleaning of exam room while observing appropriate contact time as indicated for disinfecting solutions.    Clarene Reamer, FNP-BC  [facility], [facility]  11/24/2019 5:31 PM

## 2019-11-25 LAB — IRON AND TIBC
Iron Saturation: 14 % — ABNORMAL LOW (ref 15–55)
Iron: 52 ug/dL (ref 27–159)
Total Iron Binding Capacity: 361 ug/dL (ref 250–450)
UIBC: 309 ug/dL (ref 131–425)

## 2019-11-25 LAB — CBC WITH DIFFERENTIAL/PLATELET
Basophils Absolute: 0.1 10*3/uL (ref 0.0–0.2)
Basos: 1 %
EOS (ABSOLUTE): 0.3 10*3/uL (ref 0.0–0.4)
Eos: 3 %
Hematocrit: 41.3 % (ref 34.0–46.6)
Hemoglobin: 12.9 g/dL (ref 11.1–15.9)
Immature Grans (Abs): 0.1 10*3/uL (ref 0.0–0.1)
Immature Granulocytes: 1 %
Lymphocytes Absolute: 3 10*3/uL (ref 0.7–3.1)
Lymphs: 32 %
MCH: 26.8 pg (ref 26.6–33.0)
MCHC: 31.2 g/dL — ABNORMAL LOW (ref 31.5–35.7)
MCV: 86 fL (ref 79–97)
Monocytes Absolute: 0.6 10*3/uL (ref 0.1–0.9)
Monocytes: 6 %
Neutrophils Absolute: 5.4 10*3/uL (ref 1.4–7.0)
Neutrophils: 57 %
Platelets: 303 10*3/uL (ref 150–450)
RBC: 4.81 x10E6/uL (ref 3.77–5.28)
RDW: 13.4 % (ref 11.7–15.4)
WBC: 9.4 10*3/uL (ref 3.4–10.8)

## 2019-11-25 LAB — VITAMIN B12: Vitamin B-12: 170 pg/mL — ABNORMAL LOW (ref 232–1245)

## 2019-11-25 LAB — T4, FREE: Free T4: 0.98 ng/dL (ref 0.82–1.77)

## 2019-11-25 LAB — TSH: TSH: 2.61 u[IU]/mL (ref 0.450–4.500)

## 2019-11-25 LAB — FERRITIN: Ferritin: 24 ng/mL (ref 15–150)

## 2019-11-29 ENCOUNTER — Encounter: Payer: Self-pay | Admitting: Family Medicine

## 2019-11-29 ENCOUNTER — Other Ambulatory Visit (HOSPITAL_COMMUNITY)
Admission: RE | Admit: 2019-11-29 | Discharge: 2019-11-29 | Disposition: A | Discharge status: Home / Self Care | Payer: Managed Care, Other (non HMO) | Source: Ambulatory Visit | Attending: Obstetrics and Gynecology | Admitting: Obstetrics and Gynecology

## 2019-11-29 ENCOUNTER — Encounter: Payer: Self-pay | Admitting: Obstetrics and Gynecology

## 2019-11-29 ENCOUNTER — Other Ambulatory Visit: Payer: Self-pay

## 2019-11-29 ENCOUNTER — Ambulatory Visit (INDEPENDENT_AMBULATORY_CARE_PROVIDER_SITE_OTHER): Payer: Managed Care, Other (non HMO) | Admitting: Obstetrics and Gynecology

## 2019-11-29 ENCOUNTER — Other Ambulatory Visit: Payer: Self-pay | Admitting: Obstetrics and Gynecology

## 2019-11-29 ENCOUNTER — Ambulatory Visit (INDEPENDENT_AMBULATORY_CARE_PROVIDER_SITE_OTHER): Payer: Managed Care, Other (non HMO)

## 2019-11-29 VITALS — BP 122/72 | Ht 64.0 in | Wt 235.2 lb

## 2019-11-29 DIAGNOSIS — D259 Leiomyoma of uterus, unspecified: Secondary | ICD-10-CM

## 2019-11-29 DIAGNOSIS — N760 Acute vaginitis: Secondary | ICD-10-CM | POA: Diagnosis not present

## 2019-11-29 DIAGNOSIS — D252 Subserosal leiomyoma of uterus: Secondary | ICD-10-CM

## 2019-11-29 DIAGNOSIS — Z124 Encounter for screening for malignant neoplasm of cervix: Secondary | ICD-10-CM

## 2019-11-29 DIAGNOSIS — B9689 Other specified bacterial agents as the cause of diseases classified elsewhere: Secondary | ICD-10-CM | POA: Diagnosis not present

## 2019-11-29 DIAGNOSIS — N939 Abnormal uterine and vaginal bleeding, unspecified: Secondary | ICD-10-CM

## 2019-11-29 LAB — CYTOLOGY - PAP
Chlamydia: NEGATIVE
Comment: NEGATIVE
Comment: NEGATIVE
Comment: NEGATIVE
Comment: NORMAL
Diagnosis: HIGH — AB
High risk HPV: NEGATIVE
Neisseria Gonorrhea: NEGATIVE
Trichomonas: NEGATIVE

## 2019-11-29 MED ORDER — METRONIDAZOLE 500 MG PO TABS: 500.0000 mg | ORAL_TABLET | Freq: Two times a day (BID) | ORAL | 0 refills | Status: AC

## 2019-11-29 NOTE — Progress Notes (Signed)
Patient ID: Kari Rice, female   DOB: 08/10/1975, 44 y.o.   MRN: [id number]  Reason for Consult: Gynecologic Exam   Referred by Abner Greenspan, MD  Subjective:     HPI:  Kari Rice is a 44 y.o. female she was seen last week for menorrhagia.  She was started on progesterone therapy for acute heavy vaginal bleeding.  She reports that the bleeding continued for a few days and then resolved after initiation of the progesterone.  Lab work obtained last week was normal.  She has been feeling well and has minimal pain.   Past Medical History:  Diagnosis Date  . Arthritis    right hand  . DVT (deep venous thrombosis) ([place]) 2013  . PONV (postoperative nausea and vomiting)    after cholecystectomy  . Sleep apnea    CPAP  . Varicose veins    Family History  Problem Relation Age of Onset  . Arthritis Mother   . Cancer Mother        uterine   . Heart disease Father        Heart Disease before age 23  . Cancer Father        skin   . Hypertension Father   . Heart attack Father    Past Surgical History:  Procedure Laterality Date  . CHOLECYSTECTOMY  Feb. 2006   Gall Bladder  . HAND SURGERY  2016  . NASAL SEPTOPLASTY W/ TURBINOPLASTY Bilateral 10/01/2017   Procedure: NASAL SEPTOPLASTY WITH SUBMUCOCELE RESECTION OF TURBINATE;  Surgeon: Beverly Gust, MD;  Location: [facility];  Service: ENT;  Laterality: Bilateral;  Sleep apnea  . THROMBECTOMY Right 01/13/12 and  01/21/12   Right iliac vein stent and Vein thrombosis    Short Social History:  Social History   Tobacco Use  . Smoking status: Never Smoker  . Smokeless tobacco: Never Used  Substance Use Topics  . Alcohol use: No    Alcohol/week: 0.0 standard drinks    Allergies  Allergen Reactions  . Penicillins Rash  . Sulfa Antibiotics Rash    Current Outpatient Medications  Medication Sig Dispense Refill  . Cholecalciferol (VITAMIN D3) 2000 units TABS Take 1 tablet by mouth daily.    .  metroNIDAZOLE (FLAGYL) 500 MG tablet Take 1 tablet (500 mg total) by mouth 2 (two) times daily for 7 days. 14 tablet 0  . NON FORMULARY Support hose to the waist for dx of venous insufficiency and edema 15-20 mm Hg    . norethindrone (AYGESTIN) 5 MG tablet Take 1 tablet (5 mg total) by mouth in the morning, at noon, and at bedtime. (Patient not taking: Reported on 11/24/2019) 90 tablet 2  . norethindrone (ORTHO MICRONOR) 0.35 MG tablet Take 1 tablet (0.35 mg total) by mouth daily. (Patient not taking: Reported on 11/29/2019) 28 tablet 11  . terbinafine (LAMISIL) 250 MG tablet Take 1 tablet (250 mg total) by mouth daily. For 12 weeks 30 tablet 3   No current facility-administered medications for this visit.    Review of Systems  Constitutional: Negative for chills, fatigue, fever and unexpected weight change.  HENT: Negative for trouble swallowing.  Eyes: Negative for loss of vision.  Respiratory: Negative for cough, shortness of breath and wheezing.  Cardiovascular: Negative for chest pain, leg swelling, palpitations and syncope.  GI: Negative for abdominal pain, blood in stool, diarrhea, nausea and vomiting.  GU: Negative for difficulty urinating, dysuria, frequency and hematuria.  Musculoskeletal: Negative for back pain,  leg pain and joint pain.  Skin: Negative for rash.  Neurological: Negative for dizziness, headaches, light-headedness, numbness and seizures.  Psychiatric: Negative for behavioral problem, confusion, depressed mood and sleep disturbance.        Objective:  Objective   Vitals:   11/29/19 1056  BP: 122/72  Weight: 235 lb 3.2 oz (106.7 kg)  Height: 5\' 4"  (1.626 m)   Body mass index is 40.37 kg/m.  Physical Exam Vitals and nursing note reviewed.  Constitutional:      Appearance: She is well-developed.  HENT:     Head: Normocephalic and atraumatic.  Eyes:     Pupils: Pupils are equal, round, and reactive to light.  Cardiovascular:     Rate and Rhythm: Normal  rate and regular rhythm.  Pulmonary:     Effort: Pulmonary effort is normal. No respiratory distress.  Abdominal:     General: Abdomen is flat. There is no distension.     Palpations: Abdomen is soft. There is mass.     Hernia: No hernia is present.     Comments: Uterus enlarged to level of the umbilicus  Genitourinary:    Comments: External: Normal appearing vulva. No lesions noted.  Speculum examination: Normal appearing cervix. No blood in the vaginal vault. No  discharge.   Bimanual examination: Uterus enlarged midline, non-tender, irregular shape and contour, large right sided fibroid.  No CMT. No adnexal masses. No adnexal tenderness. Pelvis not fixed.  Skin:    General: Skin is warm and dry.  Neurological:     Mental Status: She is alert and oriented to person, place, and time.  Psychiatric:        Behavior: Behavior normal.        Thought Content: Thought content normal.        Judgment: Judgment normal.     Endometrial Biopsy After discussion with the patient regarding her abnormal uterine bleeding I recommended that she proceed with an endometrial biopsy for further diagnosis. The risks, benefits, alternatives, and indications for an endometrial biopsy were discussed with the patient in detail. She understood the risks including infection, bleeding, cervical laceration and uterine perforation.  Verbal consent was obtained.   PROCEDURE NOTE:  [place] endometrial biopsy was performed using aseptic technique with iodine preparation.  The uterus was sounded to a length of 8 cm.  Adequate sampling was obtained with minimal blood loss.  The patient tolerated the procedure well.  Disposition will be pending pathology.      Assessment/Plan:     44 year old G0, P0 with abnormal uterine bleeding secondary to enlarged fibroid uterus. She has responded to progesterone treatment.  She will continue with progesterone 3 times a day for the next week.  She will then transition to twice a  day and then once a day for maintenance therapy.  She desires to have a hysterectomy for definitive therapy of her enlarged fibroid uterus.  Uterus is enlarged to the level of the umbilicus for this reason we will obtain abdominal pelvic CT for surgical planning.  Discussed possibilities of laparoscopic or robotic approach.  We will discuss further at her next visit.  Endometrial biopsy and Pap smear collected today.  Vaginal odor, will treat for bacterial vaginosis.   More than 25 minutes were spent face to face with the patient in the room, reviewing the medical record, labs and images, and coordinating care for the patient. The plan of management was discussed in detail and counseling was provided.  Adrian Prows MD [facility], [facility] 11/29/2019 12:43 PM

## 2019-11-29 NOTE — Patient Instructions (Signed)
Laparoscopically Assisted Vaginal Hysterectomy °A laparoscopically assisted vaginal hysterectomy (LAVH) is a surgical procedure to remove the uterus and cervix. Sometimes, the ovaries and fallopian tubes are also removed. This surgery may be done to treat problems such as: °· Noncancerous growths in the uterus (uterine fibroids) that cause symptoms. °· A condition that causes the lining of the uterus to grow in other areas (endometriosis). °· Problems with pelvic support. °· Cancer of the cervix, ovaries, uterus, or tissue that lines the uterus (endometrium). °· Excessive (dysfunctional) uterine bleeding. °During an LAVH, some of the surgical removal is done through the vagina, and the rest is done through a few small incisions in the abdomen. This technique may be an option for women who are not able to have a vaginal hysterectomy. °Tell a health care provider about: °· Any allergies you have. °· All medicines you are taking, including vitamins, herbs, eye drops, creams, and over-the-counter medicines. °· Any problems you or family members have had with anesthetic medicines. °· Any blood disorders you have. °· Any surgeries you have had. °· Any medical conditions you have. °· Whether you are pregnant or may be pregnant. °What are the risks? °Generally, this is a safe procedure. However, problems may occur, including: °· Infection. °· Bleeding. °· Allergic reactions to medicines. °· Damage to other structures or organs. °· Difficulty breathing. °What happens before the procedure? °Staying hydrated °Follow instructions from your health care provider about hydration, which may include: °· Up to 2 hours before the procedure - you may continue to drink clear liquids, such as water, clear fruit juice, black coffee, and plain tea. °Eating and drinking restrictions °Follow instructions from your health care provider about eating and drinking, which may include: °· 8 hours before the procedure - stop eating heavy meals or  foods such as meat, fried foods, or fatty foods. °· 6 hours before the procedure - stop eating light meals or foods, such as toast or cereal. °· 6 hours before the procedure - stop drinking milk or drinks that contain milk. °· 2 hours before the procedure - stop drinking clear liquids. °Medicines °· Ask your health care provider about: °? Changing or stopping your regular medicines. This is especially important if you are taking diabetes medicines or blood thinners. °? Taking over-the-counter medicines, vitamins, herbs, and supplements. °? Taking medicines such as aspirin and ibuprofen. These medicines can thin your blood. Do not take these medicines unless your health care provider tells you to take them. °· You may be asked to take a medicine to empty your colon (bowel preparation). °· You may be given antibiotic medicine to help prevent infection. °General instructions °· Plan to have someone take you home from the hospital or clinic. °· Ask your health care provider how your surgical site will be marked or identified. °· You may be asked to shower with a germ-killing soap. °· Do not use any products that contain nicotine or tobacco, such as cigarettes and e-cigarettes. These can delay healing after surgery. If you need help quitting, ask your health care provider. °What happens during the procedure? °· To lower your risk of infection: °? Your health care team will wash or sanitize their hands. °? Hair may be removed from the surgical area. °? Your skin will be washed with soap. °· An IV will be inserted into one of your veins. °· You will be given one or more of the following: °? A medicine to help you relax (sedative). °? A medicine to   make you fall asleep (general anesthetic). °· You may have a flexible tube (catheter) put into your bladder to drain urine. °· You may have a tube put through your nose or mouth down into your stomach (nasogastric tube). The nasogastric tube will remove digestive fluids and  prevent nausea and vomiting. °· Tight-fitting (compression) stockings will be placed on your legs to promote circulation. °· Three or four small incisions will be made in your abdomen. An incision will also be made in your vagina. °· Probes and tools will be inserted into the small incisions. The uterus and cervix (and possibly the ovaries and fallopian tubes) will be removed through your vagina as well as through the small incisions that were made in the abdomen. °· The incisions will then be closed with stitches (sutures). °The procedure may vary among health care providers and hospitals. °What happens after the procedure? °· Your blood pressure, heart rate, breathing rate, and blood oxygen level will be monitored until the medicines you were given have worn off. °· You may have a liquid diet at first. You will most likely return to your usual diet the day after surgery. °· You will still have the urinary catheter in place. It will likely be removed the day after surgery. °· You may have to wear compression stockings. These stockings help to prevent blood clots and reduce swelling in your legs. °· You will be encouraged to walk as soon as possible. You will also use a device or do breathing exercises to keep your lungs clear. °· Do not drive for 24 hours if you were given a sedative. °Summary °· A laparoscopically assisted vaginal hysterectomy (LAVH) is a surgical procedure to remove the uterus and cervix, and sometimes the ovaries and fallopian tubes. °· Follow instructions from your health care provider about eating and drinking before the procedure. °· During an LAVH, some of the surgical removal is done through the vagina, and the rest is done through a few small incisions in the abdomen. °This information is not intended to replace advice given to you by your health care provider. Make sure you discuss any questions you have with your health care provider. °Document Revised: 05/23/2018 Document Reviewed:  06/25/2016 °Elsevier Patient Education © 2020 Elsevier Inc. ° °Uterine Fibroids ° °Uterine fibroids (leiomyomas) are noncancerous (benign) tumors that can develop in the uterus. Fibroids may also develop in the fallopian tubes, cervix, or tissues (ligaments) near the uterus. °You may have one or many fibroids. Fibroids vary in size, weight, and where they grow in the uterus. Some can become quite large. Most fibroids do not require medical treatment. °What are the causes? °The cause of this condition is not known. °What increases the risk? °You are more likely to develop this condition if you: °· Are in your 30s or 40s and have not gone through menopause. °· Have a family history of this condition. °· Are of African-American descent. °· Had your first period at an early age (early menarche). °· Have not had any children (nulliparity). °· Are overweight or obese. °What are the signs or symptoms? °Many women do not have any symptoms. Symptoms of this condition may include: °· Heavy menstrual bleeding. °· Bleeding or spotting between periods. °· Pain and pressure in the pelvic area, between the hips. °· Bladder problems, such as needing to urinate urgently or more often than usual. °· Inability to have children (infertility). °· Failure to carry pregnancy to term (miscarriage). °How is this diagnosed? °This condition may   be diagnosed based on: °· Your symptoms and medical history. °· A physical exam. °· A pelvic exam that includes feeling for any tumors. °· Imaging tests, such as ultrasound or MRI. °How is this treated? °Treatment for this condition may include: °· Seeing your health care provider for follow-up visits to monitor your fibroids for any changes. °· Taking NSAIDs such as ibuprofen, naproxen, or aspirin to reduce pain. °· Hormone medicines. These may be taken as a pill, given in an injection, or delivered by a T-shaped device that is inserted into the uterus (intrauterine device, IUD). °· Surgery to remove  one of the following: °? The fibroids (myomectomy). Your health care provider may recommend this if fibroids affect your fertility and you want to become pregnant. °? The uterus (hysterectomy). °? Blood supply to the fibroids (uterine artery embolization). °Follow these instructions at home: °· Take over-the-counter and prescription medicines only as told by your health care provider. °· Ask your health care provider if you should take iron pills or eat more iron-rich foods, such as dark green, leafy vegetables. Heavy menstrual bleeding can cause low iron levels. °· If directed, apply heat to your back or abdomen to reduce pain. Use the heat source that your health care provider recommends, such as a moist heat pack or a heating pad. °? Place a towel between your skin and the heat source. °? Leave the heat on for 20-30 minutes. °? Remove the heat if your skin turns bright red. This is especially important if you are unable to feel pain, heat, or cold. You may have a greater risk of getting burned. °· Pay close attention to your menstrual cycle. Tell your health care provider about any changes, such as: °? Increased blood flow that requires you to use more pads or tampons than usual. °? A change in the number of days that your period lasts. °? A change in symptoms that are associated with your period, such as back pain or cramps in your abdomen. °· Keep all follow-up visits as told by your health care provider. This is important, especially if your fibroids need to be monitored for any changes. °Contact a health care provider if you: °· Have pelvic pain, back pain, or cramps in your abdomen that do not get better with medicine or heat. °· Develop new bleeding between periods. °· Have increased bleeding during or between periods. °· Feel unusually tired or weak. °· Feel light-headed. °Get help right away if you: °· Faint. °· Have pelvic pain that suddenly gets worse. °· Have severe vaginal bleeding that soaks a tampon  or pad in 30 minutes or less. °Summary °· Uterine fibroids are noncancerous (benign) tumors that can develop in the uterus. °· The exact cause of this condition is not known. °· Most fibroids do not require medical treatment unless they affect your ability to have children (fertility). °· Contact a health care provider if you have pelvic pain, back pain, or cramps in your abdomen that do not get better with medicines. °· Make sure you know what symptoms should cause you to get help right away. °This information is not intended to replace advice given to you by your health care provider. Make sure you discuss any questions you have with your health care provider. °Document Revised: 03/12/2017 Document Reviewed: 02/23/2017 °Elsevier Patient Education © 2020 Elsevier Inc. ° °

## 2019-11-30 ENCOUNTER — Other Ambulatory Visit: Payer: Self-pay | Admitting: Obstetrics and Gynecology

## 2019-11-30 DIAGNOSIS — D252 Subserosal leiomyoma of uterus: Secondary | ICD-10-CM

## 2019-12-01 LAB — SURGICAL PATHOLOGY

## 2019-12-06 ENCOUNTER — Ambulatory Visit (INDEPENDENT_AMBULATORY_CARE_PROVIDER_SITE_OTHER): Payer: Managed Care, Other (non HMO) | Admitting: Obstetrics and Gynecology

## 2019-12-06 ENCOUNTER — Encounter: Payer: Self-pay | Admitting: Obstetrics and Gynecology

## 2019-12-06 ENCOUNTER — Other Ambulatory Visit (HOSPITAL_COMMUNITY)
Admission: RE | Admit: 2019-12-06 | Discharge: 2019-12-06 | Disposition: A | Discharge status: Home / Self Care | Payer: Managed Care, Other (non HMO) | Source: Ambulatory Visit | Attending: Obstetrics and Gynecology | Admitting: Obstetrics and Gynecology

## 2019-12-06 ENCOUNTER — Telehealth: Payer: Self-pay

## 2019-12-06 ENCOUNTER — Other Ambulatory Visit: Payer: Self-pay

## 2019-12-06 VITALS — BP 122/76 | HR 93 | Ht 64.0 in | Wt 239.0 lb

## 2019-12-06 DIAGNOSIS — R87611 Atypical squamous cells cannot exclude high grade squamous intraepithelial lesion on cytologic smear of cervix (ASC-H): Secondary | ICD-10-CM

## 2019-12-06 NOTE — Progress Notes (Signed)
   GYNECOLOGY CLINIC COLPOSCOPY PROCEDURE NOTE  44 y.o. G0P0000 here for colposcopy for ASC cannot exclude high grade lesion [facility])  pap smear on 11/23/2019. 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  Is the patient  pregnant: No LMP: Patient's last menstrual period was 11/09/2019. Smoking status:  reports that she has never smoked. She has never used smokeless tobacco. Future fertility desired:  No  Patient given informed consent, signed copy in the chart, time out was performed.  The patient was position in dorsal lithotomy position. Speculum was placed the cervix was visualized.   After application of acetic acid colposcopic inspection of the cervix was undertaken.   Colposcopy adequate, full visualization of transformation zone: Yes HPV changes noted at 3 and 9 o'clock; corresponding biopsies obtained.   ECC specimen obtained:  Yes  All specimens were labeled and sent to pathology.   Patient was given post procedure instructions.  Will follow up pathology and manage accordingly.  Routine preventative health maintenance measures emphasized.  Physical Exam Genitourinary:        Genitourinary Comments: Sites at 1 and 2 biopsied     [place], [facility] 12/06/2019 9:54 AM

## 2019-12-06 NOTE — Progress Notes (Signed)
C/o swelling w/redness, pain warmth right lower leg. Hard to sleep last night. Started 6 days ago.

## 2019-12-06 NOTE — Telephone Encounter (Signed)
FMLA/DISABILITY form for [employer] filled out, signature obtained and given to [facility] for processing.

## 2019-12-06 NOTE — Patient Instructions (Signed)
Colposcopy, Care After This sheet gives you information about how to care for yourself after your procedure. Your doctor may also give you more specific instructions. If you have problems or questions, contact your doctor. What can I expect after the procedure? If you did not have a tissue sample removed (did not have a biopsy), you may only have some spotting for a few days. You can go back to your normal activities. If you had a tissue sample removed, it is common to have:  Soreness and pain. This may last for a few days.  Light-headedness.  Mild bleeding from your vagina or dark-colored, grainy discharge from your vagina. This may last for a few days. You may need to wear a sanitary pad.  Spotting for at least 48 hours after the procedure. Follow these instructions at home:   Take over-the-counter and prescription medicines only as told by your doctor. Ask your doctor what medicines you can start taking again. This is very important if you take blood-thinning medicine.  Do not drive or use heavy machinery while taking prescription pain medicine.  For 3 days, or as long as your doctor tells you, avoid: ? Douching. ? Using tampons. ? Having sex.  If you use birth control (contraception), keep using it.  Limit activity for the first day after the procedure. Ask your doctor what activities are safe for you.  It is up to you to get the results of your procedure. Ask your doctor when your results will be ready.  Keep all follow-up visits as told by your doctor. This is important. Contact a doctor if:  You get a skin rash. Get help right away if:  You are bleeding a lot from your vagina. It is a lot of bleeding if you are using more than one pad an hour for 2 hours in a row.  You have clumps of blood (blood clots) coming from your vagina.  You have a fever.  You have chills  You have pain in your lower belly (pelvic area).  You have signs of infection, such as vaginal  discharge that is: ? Different than usual. ? Yellow. ? Bad-smelling.  You have very pain or cramps in your lower belly that do not get better with medicine.  You feel light-headed.  You feel dizzy.  You pass out (faint). Summary  If you did not have a tissue sample removed (did not have a biopsy), you may only have some spotting for a few days. You can go back to your normal activities.  If you had a tissue sample removed, it is common to have mild pain and spotting for 48 hours.  For 3 days, or as long as your doctor tells you, avoid douching, using tampons and having sex.  Get help right away if you have bleeding, very bad pain, or signs of infection. This information is not intended to replace advice given to you by your health care provider. Make sure you discuss any questions you have with your health care provider. Document Revised: 03/12/2017 Document Reviewed: 12/18/2015 Elsevier Patient Education  2020 Elsevier Inc.  

## 2019-12-07 LAB — SURGICAL PATHOLOGY

## 2019-12-11 ENCOUNTER — Ambulatory Visit
Admission: RE | Admit: 2019-12-11 | Discharge: 2019-12-11 | Disposition: A | Discharge status: Home / Self Care | Payer: Managed Care, Other (non HMO) | Source: Ambulatory Visit | Attending: Family Medicine | Admitting: Family Medicine

## 2019-12-11 ENCOUNTER — Telehealth: Payer: Self-pay | Admitting: Family Medicine

## 2019-12-11 ENCOUNTER — Other Ambulatory Visit: Payer: Self-pay

## 2019-12-11 ENCOUNTER — Encounter: Payer: Self-pay | Admitting: Family Medicine

## 2019-12-11 ENCOUNTER — Telehealth: Payer: Self-pay | Admitting: *Deleted

## 2019-12-11 ENCOUNTER — Ambulatory Visit: Payer: Managed Care, Other (non HMO) | Admitting: Family Medicine

## 2019-12-11 VITALS — BP 124/76 | HR 70 | Temp 96.9°F | Ht 64.0 in | Wt 237.1 lb

## 2019-12-11 DIAGNOSIS — M7989 Other specified soft tissue disorders: Secondary | ICD-10-CM

## 2019-12-11 DIAGNOSIS — I872 Venous insufficiency (chronic) (peripheral): Secondary | ICD-10-CM

## 2019-12-11 MED ORDER — ENOXAPARIN SODIUM 40 MG/0.4ML ~~LOC~~ SOLN: 40.0000 mg | SUBCUTANEOUS | 1 refills | Status: DC

## 2019-12-11 NOTE — Telephone Encounter (Signed)
[place] dpt called to give [place] stat reading. It's in [place] but impression says:  Evidence of superficial thrombophlebitis involving the right great saphenous vein as well as multiple communicating right calf varicosities. No evidence of deep vein thrombosis.  They let pt go and she will keep cell phone close by so we can f/u with results

## 2019-12-11 NOTE — Telephone Encounter (Signed)
Plan is for lovenox 40 u once daily  I sent to pharmacy-let me know if any problems  Let's keep her out of work one more day to elevate leg and start the lovenox then she can go back on sept 1  F/u with me for a re check in 2-4 weeks or earlier if symptoms worsen

## 2019-12-11 NOTE — Assessment & Plan Note (Signed)
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

## 2019-12-11 NOTE — Telephone Encounter (Signed)
See the result note Thanks

## 2019-12-11 NOTE — Assessment & Plan Note (Signed)
In pt with past h/o DVT (also varicosities and phlebitis) Has been more sedentary after a gyn procedure Taking progesterone only  Not taking asa currently -will start 81 mg daily  Sending for venous doppler now  Out of work today and inst to elevate/use gentle heat on leg If worse or if any cp/sob inst to go to ER

## 2019-12-11 NOTE — Telephone Encounter (Signed)
Pt notified of Dr. Tower's comments and instructions and verbalized understanding. F/u appt scheduled 

## 2019-12-11 NOTE — Patient Instructions (Signed)
Elevate your leg and use gentle heat on it  Wear support stocking I ordered a venous doppler-and you will get a call to set that up  This is most likely phlebitis but we need to rule out a DVT   Take 81 mg coated aspirin with food daily

## 2019-12-11 NOTE — Telephone Encounter (Signed)
-----   Message from Tammi Sou, [place] sent at 12/11/2019  2:39 PM EDT ----- Pt notified of [place] results and Dr. Marliss Coots comments. Pt has given herself lovenox inj in the past so she is okay with you sending in the Rx for her to self administer. [facility].   Pt did have a question. She said she is suppose to go back to work tomorrow but given findings and sxs should she be out of work for a few days, and if so how long and she would need a work note. Please advise

## 2019-12-11 NOTE — Progress Notes (Signed)
Subjective:    Patient ID: Kari Rice, female    DOB: 1976-01-19, 44 y.o.   MRN: [id number]  This visit occurred during the SARS-CoV-2 public health emergency.  Safety protocols were in place, including screening questions prior to the visit, additional usage of staff PPE, and extensive cleaning of exam room while observing appropriate contact time as indicated for disinfecting solutions.    HPI Pt presents with R leg and swelling   Wt Readings from Last 3 Encounters:  12/11/19 237 lb 1 oz (107.5 kg)  12/06/19 239 lb (108.4 kg)  11/29/19 235 lb 3.2 oz (106.7 kg)   40.69 kg/m   BP Readings from Last 3 Encounters:  12/11/19 124/76  12/06/19 122/76  11/29/19 122/72   Pulse Readings from Last 3 Encounters:  12/11/19 70  12/06/19 93  11/24/19 80   About a week of symptoms  Pain mostly below the knee- warm to the touch  Swelling also  Tender to the touch   This is the leg she had DVT in 2013 (high- in groin)- had to have a procedure for that   She had a colposcopy/cervical bx and uterine bx recently  Was resting a little more-then made effort to get up and walk in light of past history  Was on more hormones for a while  Now taking norethindrone 5 mg- and planning a hysterectomy in October  A lot of bleeding and fibroids    She is not on asa  Takes tylenol for pain   No cp or sob   Patient Active Problem List   Diagnosis Date Noted  . Right leg swelling 12/11/2019  . Fungal nail infection 10/11/2019  . Contraception management 10/11/2019  . Great toe pain, right 12/05/2018  . Left anterior knee pain 04/26/2018  . Injury of left toe 01/25/2018  . Left hand paresthesia 11/22/2017  . GERD (gastroesophageal reflux disease) 11/22/2017  . H/O vaginitis 08/10/2017  . Bell's palsy 07/07/2017  . Headache 07/07/2017  . Hair loss 03/25/2016  . Vitamin D deficiency 01/15/2016  . Routine general medical examination at a health care facility 05/29/2014  . Encounter  for routine gynecological examination 05/29/2014  . Screening for HIV (human immunodeficiency virus) 05/29/2014  . Encounter for screening mammogram for breast cancer 05/29/2014  . Joint pain 04/10/2014  . History of DVT (deep vein thrombosis) 07/06/2012  . Varicose veins of lower extremities with other complications 40/97/3532  . Venous (peripheral) insufficiency 12/23/2011  . Venous insufficiency 11/25/2011  . Phlebitis and thrombophlebitis of the leg 11/06/2011  . Venous insufficiency, peripheral 10/26/2011  . Pedal edema 10/26/2011  . IRREGULAR MENSES 02/29/2008  . 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 Arthritis    right hand  . DVT (deep venous thrombosis) ([place]) 2013  . PONV (postoperative nausea and vomiting)    after cholecystectomy  . Sleep apnea    CPAP  . Varicose veins    Past Surgical History:  Procedure Laterality Date  . CHOLECYSTECTOMY  Feb. 2006   Gall Bladder  . HAND SURGERY  2016  . NASAL SEPTOPLASTY W/ TURBINOPLASTY Bilateral 10/01/2017   Procedure: NASAL SEPTOPLASTY WITH SUBMUCOCELE RESECTION OF TURBINATE;  Surgeon: Beverly Gust, MD;  Location: [place];  Service: ENT;  Laterality: Bilateral;  Sleep apnea  . THROMBECTOMY Right 01/13/12 and  01/21/12   Right iliac vein stent and Vein thrombosis   Social History   Tobacco Use  . Smoking status: Never  Smoker  . Smokeless tobacco: Never Used  Vaping Use  . Vaping Use: Never used  Substance Use Topics  . Alcohol use: No    Alcohol/week: 0.0 standard drinks  . Drug use: No   Family History  Problem Relation Age of Onset  . Arthritis Mother   . Cancer Mother        uterine   . Heart disease Father        Heart Disease before age 33  . Cancer Father        skin   . Hypertension Father   . Heart attack Father    Allergies  Allergen Reactions  . Penicillins Rash  . Sulfa Antibiotics Rash   Current Outpatient Medications on File Prior to Visit    Medication Sig Dispense Refill  . Cholecalciferol (VITAMIN D3) 2000 units TABS Take 1 tablet by mouth daily.    . NON FORMULARY Support hose to the waist for dx of venous insufficiency and edema 15-20 mm Hg    . norethindrone (AYGESTIN) 5 MG tablet Take 1 tablet (5 mg total) by mouth in the morning, at noon, and at bedtime. 90 tablet 2  . terbinafine (LAMISIL) 250 MG tablet Take 1 tablet (250 mg total) by mouth daily. For 12 weeks 30 tablet 3   No current facility-administered medications on file prior to visit.    Review of Systems  Constitutional: Negative for activity change, appetite change, fatigue, fever and unexpected weight change.  HENT: Negative for congestion, ear pain, rhinorrhea, sinus pressure and sore throat.   Eyes: Negative for pain, redness and visual disturbance.  Respiratory: Negative for cough, shortness of breath and wheezing.   Cardiovascular: Positive for leg swelling. Negative for chest pain and palpitations.       Varicose veins  Gastrointestinal: Negative for abdominal pain, blood in stool, constipation and diarrhea.  Endocrine: Negative for polydipsia and polyuria.  Genitourinary: Negative for dysuria, frequency and urgency.  Musculoskeletal: Negative for arthralgias, back pain and myalgias.       Pain and swelling in RLE- mostly below knee with tender vein  Skin: Negative for pallor and rash.  Allergic/Immunologic: Negative for environmental allergies.  Neurological: Negative for dizziness, syncope and headaches.  Hematological: Negative for adenopathy. Does not bruise/bleed easily.  Psychiatric/Behavioral: Negative for decreased concentration and dysphoric mood. The patient is not nervous/anxious.        Objective:   Physical Exam Constitutional:      General: She is not in acute distress.    Appearance: Normal appearance. She is well-developed. She is obese. She is not ill-appearing or diaphoretic.  HENT:     Head: Normocephalic and atraumatic.   Eyes:     General: No scleral icterus.    Conjunctiva/sclera: Conjunctivae normal.     Pupils: Pupils are equal, round, and reactive to light.  Neck:     Thyroid: No thyromegaly.     Vascular: No carotid bruit or JVD.  Cardiovascular:     Rate and Rhythm: Normal rate and regular rhythm.     Pulses: Normal pulses.     Heart sounds: Normal heart sounds. No gallop.   Pulmonary:     Effort: Pulmonary effort is normal. No respiratory distress.     Breath sounds: Normal breath sounds. No wheezing or rales.  Abdominal:     General: Bowel sounds are normal. There is no distension or abdominal bruit.     Palpations: Abdomen is soft. There is no mass.  Tenderness: There is no abdominal tenderness.  Musculoskeletal:     Cervical back: Normal range of motion and neck supple.     Comments: Palpable compressible varicosity noted in medial R lower leg - slt erythema and moderate tenderness and swelling Area is warm to the touch Neg homan's sign - bilat  Some pain to bear weight  No tenderness above the knee  Lymphadenopathy:     Cervical: No cervical adenopathy.  Skin:    General: Skin is warm and dry.     Findings: Erythema present. No rash.  Neurological:     Mental Status: She is alert.     Coordination: Coordination normal.     Deep Tendon Reflexes: Reflexes are normal and symmetric. Reflexes normal.  Psychiatric:        Mood and Affect: Mood normal.           Assessment & Plan:   Problem List Items Addressed This Visit      Cardiovascular and Mediastinum   Venous (peripheral) insufficiency    Ongoing issue with varicosities-esp in R leg Wears support stocking Now having inc pain and swelling  Sent for venous doppler  Past hx of DVT-enc 81 mg asa daily  May have phlebitis- enc elevation /heat  Off work today      Relevant Orders   US Venous Img Lower Unilateral Right (DVT)     Other   Right leg swelling - Primary    In pt with past h/o DVT (also varicosities  and phlebitis) Has been more sedentary after a gyn procedure Taking progesterone only  Not taking asa currently -will start 81 mg daily  Sending for venous doppler now  Out of work today and inst to elevate/use gentle heat on leg If worse or if any cp/sob inst to go to ER      Relevant Orders   US Venous Img Lower Unilateral Right (DVT)

## 2019-12-20 ENCOUNTER — Other Ambulatory Visit: Payer: Self-pay

## 2019-12-20 ENCOUNTER — Ambulatory Visit
Admission: RE | Admit: 2019-12-20 | Discharge: 2019-12-20 | Disposition: A | Discharge status: Home / Self Care | Payer: Managed Care, Other (non HMO) | Source: Ambulatory Visit | Attending: Obstetrics and Gynecology | Admitting: Obstetrics and Gynecology

## 2019-12-20 DIAGNOSIS — D252 Subserosal leiomyoma of uterus: Secondary | ICD-10-CM

## 2019-12-20 MED ORDER — GADOBUTROL 1 MMOL/ML IV SOLN
8.0000 mL | Freq: Once | INTRAVENOUS | Status: AC | PRN
  Administered 2019-12-20: 8 mL via INTRAVENOUS

## 2019-12-21 ENCOUNTER — Ambulatory Visit (INDEPENDENT_AMBULATORY_CARE_PROVIDER_SITE_OTHER): Payer: Managed Care, Other (non HMO) | Admitting: Obstetrics and Gynecology

## 2019-12-21 ENCOUNTER — Encounter: Payer: Self-pay | Admitting: Obstetrics and Gynecology

## 2019-12-21 VITALS — BP 114/72 | HR 66 | Resp 18 | Ht 64.0 in | Wt 238.4 lb

## 2019-12-21 DIAGNOSIS — D252 Subserosal leiomyoma of uterus: Secondary | ICD-10-CM

## 2019-12-21 NOTE — Progress Notes (Signed)
Patient ID: Kari Rice, female   DOB: Jul 29, 1975, 44 y.o.   MRN: [id number]  Reason for Consult: Gynecologic Exam   Referred by Abner Greenspan, MD  Subjective:     HPI:  Kari Rice is a 44 y.o. female. She is interested in a hysterectomy for definitive treatment of her enlarged fibroid uterus.  Past Medical History:  Diagnosis Date  . Arthritis    right hand  . DVT (deep venous thrombosis) ([place]) 2013  . GERD (gastroesophageal reflux disease)   . PONV (postoperative nausea and vomiting)    after cholecystectomy  . Sleep apnea    CPAP  . Varicose veins    Family History  Problem Relation Age of Onset  . Arthritis Mother   . Cancer Mother        uterine   . Heart disease Father        Heart Disease before age 18  . Cancer Father        skin   . Hypertension Father   . Heart attack Father    Past Surgical History:  Procedure Laterality Date  . CHOLECYSTECTOMY  Feb. 2006   Gall Bladder  . HAND SURGERY  2016  . NASAL SEPTOPLASTY W/ TURBINOPLASTY Bilateral 10/01/2017   Procedure: NASAL SEPTOPLASTY WITH SUBMUCOCELE RESECTION OF TURBINATE;  Surgeon: Beverly Gust, MD;  Location: [place];  Service: ENT;  Laterality: Bilateral;  Sleep apnea  . ROBOTIC ASSISTED LAPAROSCOPIC HYSTERECTOMY AND SALPINGECTOMY Bilateral 03/13/2020   Procedure: XI ROBOTIC ASSISTED LAPAROSCOPIC HYSTERECTOMY AND SALPINGECTOMY;  Surgeon: Homero Fellers, MD;  Location: [facility];  Service: Gynecology;  Laterality: Bilateral;  . THROMBECTOMY Right 01/13/12 and  01/21/12   Right iliac vein stent and Vein thrombosis    Short Social History:  Social History   Tobacco Use  . Smoking status: Never Smoker  . Smokeless tobacco: Never Used  Substance Use Topics  . Alcohol use: No    Alcohol/week: 0.0 standard drinks    Allergies  Allergen Reactions  . Penicillins Rash  . Sulfa Antibiotics Rash    Current Outpatient Medications  Medication Sig Dispense Refill  .  Cholecalciferol (VITAMIN D3) 2000 units TABS Take 1 tablet by mouth daily.    . NON FORMULARY Support hose to the waist for dx of venous insufficiency and edema 15-20 mm Hg    . acetaminophen (TYLENOL) 650 MG CR tablet Take 1,300 mg by mouth every 8 (eight) hours as needed for pain.    Marland Kitchen FLUoxetine (PROZAC) 20 MG capsule Take 1 capsule (20 mg total) by mouth daily. 90 capsule 3  . omeprazole (PRILOSEC) 20 MG capsule Take 20 mg by mouth daily.     No current facility-administered medications for this visit.    Review of Systems  Constitutional: Negative for chills, fatigue, fever and unexpected weight change.  HENT: Negative for trouble swallowing.  Eyes: Negative for loss of vision.  Respiratory: Negative for cough, shortness of breath and wheezing.  Cardiovascular: Negative for chest pain, leg swelling, palpitations and syncope.  GI: Negative for abdominal pain, blood in stool, diarrhea, nausea and vomiting.  GU: Negative for difficulty urinating, dysuria, frequency and hematuria.  Musculoskeletal: Negative for back pain, leg pain and joint pain.  Skin: Negative for rash.  Neurological: Negative for dizziness, headaches, light-headedness, numbness and seizures.  Psychiatric: Negative for behavioral problem, confusion, depressed mood and sleep disturbance.        Objective:  Objective   Vitals:   12/21/19  0809  BP: 114/72  Pulse: 66  Resp: 18  SpO2: 99%  Weight: 238 lb 6.4 oz (108.1 kg)  Height: 5\' 4"  (1.626 m)   Body mass index is 40.92 kg/m.  Physical Exam Vitals and nursing note reviewed. Exam conducted with a chaperone present.  Constitutional:      Appearance: Normal appearance.  HENT:     Head: Normocephalic and atraumatic.  Eyes:     Extraocular Movements: Extraocular movements intact.     Pupils: Pupils are equal, round, and reactive to light.  Cardiovascular:     Rate and Rhythm: Normal rate and regular rhythm.  Pulmonary:     Effort: Pulmonary effort is  normal.     Breath sounds: Normal breath sounds.  Abdominal:     General: Abdomen is flat.     Palpations: Abdomen is soft.  Musculoskeletal:     Cervical back: Normal range of motion.  Skin:    General: Skin is warm and dry.  Neurological:     General: No focal deficit present.     Mental Status: She is alert and oriented to person, place, and time.  Psychiatric:        Behavior: Behavior normal.        Thought Content: Thought content normal.        Judgment: Judgment normal.     Assessment/Plan:     44 yo with enlarged fibroid uterus Robotic hysterectomy may be best approach for minimally invasive procedure given size of her uterus Did have a recent superficial clot and is on a blood thinner.  Adrian Prows MD, [facility], [facility] 12/21/2019 8:43 AM

## 2019-12-21 NOTE — Progress Notes (Signed)
Pt is here for MRI results.

## 2019-12-25 ENCOUNTER — Telehealth: Payer: Self-pay

## 2019-12-25 NOTE — Telephone Encounter (Signed)
Pt calling; would like a call back for surgery after CRS reviews MRI.  [phone]

## 2019-12-26 ENCOUNTER — Other Ambulatory Visit: Payer: Self-pay

## 2019-12-26 ENCOUNTER — Encounter: Payer: Self-pay | Admitting: Family Medicine

## 2019-12-26 ENCOUNTER — Ambulatory Visit: Payer: Managed Care, Other (non HMO) | Admitting: Family Medicine

## 2019-12-26 VITALS — BP 106/74 | HR 74 | Temp 96.9°F | Ht 64.0 in | Wt 236.2 lb

## 2019-12-26 DIAGNOSIS — I803 Phlebitis and thrombophlebitis of lower extremities, unspecified: Secondary | ICD-10-CM

## 2019-12-26 DIAGNOSIS — F430 Acute stress reaction: Secondary | ICD-10-CM

## 2019-12-26 DIAGNOSIS — Z23 Encounter for immunization: Secondary | ICD-10-CM | POA: Diagnosis not present

## 2019-12-26 DIAGNOSIS — F341 Dysthymic disorder: Secondary | ICD-10-CM | POA: Insufficient documentation

## 2019-12-26 DIAGNOSIS — D259 Leiomyoma of uterus, unspecified: Secondary | ICD-10-CM | POA: Insufficient documentation

## 2019-12-26 NOTE — Telephone Encounter (Signed)
Surgery Booking Request Patient Full Name:  Kari Rice  MRN: [id number]  DOB: [birth year]  Surgeon: Homero Fellers, MD  Requested Surgery Date and Time: Please discuss with me prior to contacting patient- thank you! Primary Diagnosis AND Code: enlarged fibroid uterus Secondary Diagnosis and Code:  Surgical Procedure: Robotic LH/BS RNFA Requested?: No L&D Notification: No Admission Status: same day surgery Length of Surgery: 200 min Special Case Needs: No H&P: Yes Phone Interview???:  Yes Interpreter: No Medical Clearance:  No Special Scheduling Instructions: No Any known health/anesthesia issues, diabetes, sleep apnea, latex allergy, defibrillator/pacemaker?: No Acuity: P3   (P1 highest, P2 delay may cause harm, P3 low, elective gyn, P4 lowest)

## 2019-12-26 NOTE — Assessment & Plan Note (Signed)
Planning hysterectomy soon  Continues norethindrone for bleeding

## 2019-12-26 NOTE — Progress Notes (Signed)
Subjective:    Patient ID: Kari Rice, female    DOB: [birth year], 44 y.o.   MRN: [id number]  This visit occurred during the SARS-CoV-2 public health emergency.  Safety protocols were in place, including screening questions prior to the visit, additional usage of staff PPE, and extensive cleaning of exam room while observing appropriate contact time as indicated for disinfecting solutions.    HPI Pt presents for f/u of thrombophlebitis in R leg  (also positive depression screen  Wt Readings from Last 3 Encounters:  12/26/19 236 lb 3 oz (107.1 kg)  12/21/19 238 lb 6.4 oz (108.1 kg)  12/11/19 237 lb 1 oz (107.5 kg)   40.54 kg/m  Overall leg is doing better Not red or hot  No pain    Last visit-no DVT Last visit-venous doppler: US Venous Img Lower Unilateral Right (DVT) (Accession [id number]) (Order [id number]) Imaging Date: 12/11/2019 Department: [facility] Released By: Brunilda Payor Authorizing: Anurag Scarfo, Wynelle Fanny, MD  Exam Status  Status  Final [99]  PACS Intelerad Image Link  Show images for US Venous Img Lower Unilateral Right (DVT) Study Result  Narrative & Impression  CLINICAL DATA:  Right lower extremity pain, edema and phlebitis. 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  EXAM: RIGHT LOWER EXTREMITY VENOUS DOPPLER ULTRASOUND  TECHNIQUE: Gray-scale sonography with graded compression, as well as color Doppler and duplex ultrasound were performed to evaluate the lower extremity deep venous systems from the level of the common femoral vein and including the common femoral, femoral, profunda femoral, popliteal and calf veins including the posterior tibial, peroneal and gastrocnemius veins when visible. The superficial great saphenous vein was also interrogated. Spectral Doppler was utilized to evaluate flow at rest and with distal augmentation maneuvers in the  common femoral, femoral and popliteal veins.  COMPARISON:  None.  FINDINGS: Contralateral Common Femoral Vein: Respiratory phasicity is normal and symmetric with the symptomatic side. No evidence of thrombus. Normal compressibility.  Common Femoral Vein: No evidence of thrombus. Normal compressibility, respiratory phasicity and response to augmentation.  Saphenofemoral Junction: No evidence of thrombus. Normal compressibility and flow on color Doppler imaging.  Profunda Femoral Vein: No evidence of thrombus. Normal compressibility and flow on color Doppler imaging.  Femoral Vein: No evidence of thrombus. Normal compressibility, respiratory phasicity and response to augmentation.  Popliteal Vein: No evidence of thrombus. Normal compressibility, respiratory phasicity and response to augmentation.  Calf Veins: No evidence of thrombus. Normal compressibility and flow on color Doppler imaging.  Superficial Great Saphenous Vein: Thrombus is seen throughout most of the great saphenous vein in the right thigh with the vein demonstrating partial compressibility.  Venous Reflux:  None.  Other Findings: Multiple prominent right medial calf varicosities demonstrate thrombosis.  IMPRESSION: Evidence of superficial thrombophlebitis involving the right great saphenous vein as well as multiple communicating right calf varicosities. No evidence of deep vein thrombosis.   Electronically Signed   By: Aletta Edouard M.D.   On: 12/11/2019 12:36   Chose to tx with anticoag for 45 days lovenox- doing well  (low dose lovenox)  Started on 8/30  Will use until mid october    BP Readings from Last 3 Encounters:  12/26/19 106/74  12/21/19 114/72  12/11/19 124/76   Pulse Readings from Last 3 Encounters:  12/26/19 74  12/21/19 66  12/11/19 70    Planning a hysterectomy (robotic) - for fibroids  Unsure if  partial or total  A little bleeding /vaginal so she inc her  progesterone   Injection sites are bruising   Her PHQ score was elevated at gyn visit  She thinks primarily due to stressors  Some fatigue and loss of interest  Very physical job-but would like to get some exercise   Patient Active Problem List   Diagnosis Date Noted  . Stress reaction 12/26/2019  . Fibroid uterus 12/26/2019  . Right leg swelling 12/11/2019  . Fungal nail infection 10/11/2019  . Contraception management 10/11/2019  . Great toe pain, right 12/05/2018  . Left anterior knee pain 04/26/2018  . Injury of left toe 01/25/2018  . Left hand paresthesia 11/22/2017  . GERD (gastroesophageal reflux disease) 11/22/2017  . H/O vaginitis 08/10/2017  . Bell's palsy 07/07/2017  . Headache 07/07/2017  . Hair loss 03/25/2016  . Vitamin D deficiency 01/15/2016  . Routine general medical examination at a health care facility 05/29/2014  . Encounter for routine gynecological examination 05/29/2014  . Screening for HIV (human immunodeficiency virus) 05/29/2014  . Encounter for screening mammogram for breast cancer 05/29/2014  . Joint pain 04/10/2014  . History of DVT (deep vein thrombosis) 07/06/2012  . Varicose veins of lower extremities with other complications 16/01/9603  . Venous (peripheral) insufficiency 12/23/2011  . Phlebitis and thrombophlebitis of the leg 11/06/2011  . Venous insufficiency, peripheral 10/26/2011  . Pedal edema 10/26/2011  . IRREGULAR MENSES 02/29/2008  . 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 Arthritis    right hand  . DVT (deep venous thrombosis) ([place]) 2013  . PONV (postoperative nausea and vomiting)    after cholecystectomy  . Sleep apnea    CPAP  . Varicose veins    Past Surgical History:  Procedure Laterality Date  . CHOLECYSTECTOMY  Feb. 2006   Gall Bladder  . HAND SURGERY  2016  . NASAL SEPTOPLASTY W/ TURBINOPLASTY Bilateral 10/01/2017   Procedure: NASAL SEPTOPLASTY WITH SUBMUCOCELE RESECTION OF  TURBINATE;  Surgeon: Beverly Gust, MD;  Location: [facility];  Service: ENT;  Laterality: Bilateral;  Sleep apnea  . THROMBECTOMY Right 01/13/12 and  01/21/12   Right iliac vein stent and Vein thrombosis   Social History   Tobacco Use  . Smoking status: Never Smoker  . Smokeless tobacco: Never Used  Vaping Use  . Vaping Use: Never used  Substance Use Topics  . Alcohol use: No    Alcohol/week: 0.0 standard drinks  . Drug use: No   Family History  Problem Relation Age of Onset  . Arthritis Mother   . Cancer Mother        uterine   . Heart disease Father        Heart Disease before age 48  . Cancer Father        skin   . Hypertension Father   . Heart attack Father    Allergies  Allergen Reactions  . Penicillins Rash  . Sulfa Antibiotics Rash   Current Outpatient Medications on File Prior to Visit  Medication Sig Dispense Refill  . Cholecalciferol (VITAMIN D3) 2000 units TABS Take 1 tablet by mouth daily.    Marland Kitchen enoxaparin (LOVENOX) 40 MG/0.4ML injection Inject 0.4 mLs (40 mg total) into the skin daily. 12 mL 1  . NON FORMULARY Support hose to the waist for dx of venous insufficiency and edema 15-20 mm Hg    . norethindrone (AYGESTIN) 5 MG tablet Take 1 tablet (5 mg total) by  mouth in the morning, at noon, and at bedtime. 90 tablet 2  . terbinafine (LAMISIL) 250 MG tablet Take 1 tablet (250 mg total) by mouth daily. For 12 weeks 30 tablet 3   No current facility-administered medications on file prior to visit.    Review of Systems  Constitutional: Negative for activity change, appetite change, fatigue, fever and unexpected weight change.  HENT: Negative for congestion, ear pain, rhinorrhea, sinus pressure and sore throat.   Eyes: Negative for pain, redness and visual disturbance.  Respiratory: Negative for cough, shortness of breath and wheezing.   Cardiovascular: Positive for leg swelling. Negative for chest pain and palpitations.       Leg swelling and pain  are much improved  Gastrointestinal: Negative for abdominal pain, blood in stool, constipation and diarrhea.  Endocrine: Negative for polydipsia and polyuria.  Genitourinary: Positive for menstrual problem and pelvic pain. Negative for dysuria, frequency and urgency.  Musculoskeletal: Negative for arthralgias, back pain and myalgias.  Skin: Negative for pallor and rash.  Allergic/Immunologic: Negative for environmental allergies.  Neurological: Negative for dizziness, syncope and headaches.  Hematological: Negative for adenopathy. Does not bruise/bleed easily.  Psychiatric/Behavioral: Positive for dysphoric mood and sleep disturbance. Negative for decreased concentration and suicidal ideas. The patient is nervous/anxious.        Objective:   Physical Exam Constitutional:      General: She is not in acute distress.    Appearance: Normal appearance. She is well-developed. She is obese. She is not ill-appearing or diaphoretic.  HENT:     Head: Normocephalic and atraumatic.  Eyes:     General: No scleral icterus.    Conjunctiva/sclera: Conjunctivae normal.     Pupils: Pupils are equal, round, and reactive to light.  Neck:     Thyroid: No thyromegaly.     Vascular: No carotid bruit or JVD.  Cardiovascular:     Rate and Rhythm: Normal rate and regular rhythm.     Heart sounds: Normal heart sounds. No gallop.      Comments: Varicosities in LE_worse on the R Pulmonary:     Effort: Pulmonary effort is normal. No respiratory distress.     Breath sounds: Normal breath sounds. No wheezing or rales.  Abdominal:     General: Bowel sounds are normal. There is no distension or abdominal bruit.     Palpations: Abdomen is soft. There is no mass.     Tenderness: There is no abdominal tenderness.  Musculoskeletal:        General: No tenderness or signs of injury.     Cervical back: Normal range of motion and neck supple.     Right lower leg: No edema.     Left lower leg: No edema.      Comments: R leg is no longer swollen or red or tender No palp cords  Notable compressible large varicosity medially  Neg homan's sign  Lymphadenopathy:     Cervical: No cervical adenopathy.  Skin:    General: Skin is warm and dry.     Coloration: Skin is not pale.     Findings: No erythema or rash.  Neurological:     Mental Status: She is alert.     Coordination: Coordination normal.     Deep Tendon Reflexes: Reflexes are normal and symmetric. Reflexes normal.  Psychiatric:        Mood and Affect: Mood normal.           Assessment & Plan:   Problem List  Items Addressed This Visit      Cardiovascular and Mediastinum   Phlebitis and thrombophlebitis of the leg - Primary    R leg  Much improved with lovenox -plan on 45 day course Venous US reviewed  No tenderness on exam  Is wearing supp hose daily  May need gyn surgery soon-given permission to hold the lovenox for surgery and then re start for 45 day course        Genitourinary   Fibroid uterus    Planning hysterectomy soon  Continues norethindrone for bleeding          Other   Stress reaction    With positive depression screening (rev from gyn visit)  Much stress causing fatigue and loss of interest Reviewed stressors/ coping techniques/symptoms/ support sources/ tx options and side effects in detail today  Enc her to add some more movement in addn to exercise at active job  Ref for counseling  No SI- will continue to follow May consider medication if worse or no improvement        Relevant Orders   Ambulatory referral to Psychology    Other Visit Diagnoses    Need for influenza vaccination       Relevant Orders   Flu Vaccine QUAD 36+ mos IM (Completed)

## 2019-12-26 NOTE — Assessment & Plan Note (Signed)
R leg  Much improved with lovenox -plan on 45 day course Venous US reviewed  No tenderness on exam  Is wearing supp hose daily  May need gyn surgery soon-given permission to hold the lovenox for surgery and then re start for 45 day course

## 2019-12-26 NOTE — Patient Instructions (Addendum)
Try to get some exercise when you can to help with energy and mood  Yoga/chair yoga  Meditation (apps like Calm and Headspace)- very helpful for stress  Take care of yourself I placed a referral for counseling- office will call you   Plan-is for 45 days of lovenox (mid October) If you need surgery in the meantime - we will stop for as long as gyn wants  Wear TED hose for surgery and after

## 2019-12-26 NOTE — Assessment & Plan Note (Signed)
With positive depression screening (rev from gyn visit)  Much stress causing fatigue and loss of interest Reviewed stressors/ coping techniques/symptoms/ support sources/ tx options and side effects in detail today  Enc her to add some more movement in addn to exercise at active job  Ref for counseling  No SI- will continue to follow May consider medication if worse or no improvement

## 2020-01-11 ENCOUNTER — Telehealth: Payer: Self-pay | Admitting: *Deleted

## 2020-01-11 NOTE — Telephone Encounter (Signed)
PA done for pt's lovenox at [email or web address], I will await a response

## 2020-01-15 ENCOUNTER — Encounter: Payer: Self-pay | Admitting: Family Medicine

## 2020-01-15 NOTE — Telephone Encounter (Signed)
See mychart, pt has already finished 1 month and was suppose to be on it for 45 days. She doesn't know if she should only use 15 days of med or use the full 30 day supply (will be 60 days total)

## 2020-01-22 ENCOUNTER — Ambulatory Visit (INDEPENDENT_AMBULATORY_CARE_PROVIDER_SITE_OTHER): Payer: 59 | Admitting: Psychology

## 2020-01-22 DIAGNOSIS — F321 Major depressive disorder, single episode, moderate: Secondary | ICD-10-CM | POA: Diagnosis not present

## 2020-01-25 NOTE — Telephone Encounter (Signed)
Called patient to schedule [facility]BS w Gilman Schmidt  DOS 12/1  H&P 11/24 @ 8:10   Covid testing 11/29 @ 8-10:30, [address], drive up and wear mask. Advised pt to quarantine until DOS.  Pre-admit phone call appointment to be requested - date and time will be included on H&P paper work. Also all appointments will be updated on pt MyChart. Explained that this appointment has a call window. Based on the time scheduled will indicate if the call will be received within a 4 hour window before 1:00 or after.  Advised that pt may also receive calls from the hospital pharmacy and pre-service center.  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 No secondary insurance.  Gilman Schmidt has requested that Dr Theora Gianotti assist in [place].

## 2020-02-05 ENCOUNTER — Ambulatory Visit (INDEPENDENT_AMBULATORY_CARE_PROVIDER_SITE_OTHER): Payer: 59 | Admitting: Psychology

## 2020-02-05 DIAGNOSIS — F321 Major depressive disorder, single episode, moderate: Secondary | ICD-10-CM

## 2020-02-19 ENCOUNTER — Ambulatory Visit (INDEPENDENT_AMBULATORY_CARE_PROVIDER_SITE_OTHER): Payer: 59 | Admitting: Psychology

## 2020-02-19 DIAGNOSIS — F321 Major depressive disorder, single episode, moderate: Secondary | ICD-10-CM

## 2020-02-23 ENCOUNTER — Ambulatory Visit: Payer: Managed Care, Other (non HMO) | Admitting: Family Medicine

## 2020-02-23 ENCOUNTER — Encounter: Payer: Self-pay | Admitting: Family Medicine

## 2020-02-23 ENCOUNTER — Other Ambulatory Visit: Payer: Self-pay

## 2020-02-23 DIAGNOSIS — F341 Dysthymic disorder: Secondary | ICD-10-CM

## 2020-02-23 MED ORDER — HYDROXYZINE HCL 10 MG PO TABS: 10.0000 mg | ORAL_TABLET | Freq: Every evening | ORAL | 1 refills | Status: DC | PRN

## 2020-02-23 MED ORDER — FLUOXETINE HCL 10 MG PO CAPS: 10.0000 mg | ORAL_CAPSULE | Freq: Every day | ORAL | 1 refills | Status: DC

## 2020-02-23 NOTE — Assessment & Plan Note (Signed)
Pt is in counseling   Lot of change recently and planning a hysterectomy  Reviewed stressors/ coping techniques/symptoms/ support sources/ tx options and side effects in detail today Sluggish/sad and unmotivated    No SI Will try fluoxetine 10 mg daily for 2 wk If well tolerated go up to 20 mg daily  Discussed expectations of SSRI medication including time to effectiveness and mechanism of action, also poss of side effects (early and late)- including mental fuzziness, weight or appetite change, nausea and poss of worse dep or anxiety (even suicidal thoughts)  Pt voiced understanding and will stop med and update if this occurs   Enc exercise and outdoor time  Enc self care  For sleep- low dose hydroxyzine px prn (disc sedation with this)  As mood improves I suspect sleep will improve also  F/u 4-6 wk or earlier if needed

## 2020-02-23 NOTE — Patient Instructions (Addendum)
Take the generic prozac (fluoxetine) one pill daily  After 2 weeks you can increase to 2 pills once daily - if tolerated -let us know and we will send in new px for the 20 mg pills   Follow up with [place] in 4-6 weeks   For sleep=you can try hydroxyzine as needed   Continue counseling  Do make a plan for the exercise  Take time to take care of yourself   Socialization is good- push yourself

## 2020-02-23 NOTE — Progress Notes (Signed)
Subjective:    Patient ID: Kari Rice, female    DOB: 12/13/1975, 44 y.o.   MRN: [id number]  This visit occurred during the SARS-CoV-2 public health emergency.  Safety protocols were in place, including screening questions prior to the visit, additional usage of staff PPE, and extensive cleaning of exam room while observing appropriate contact time as indicated for disinfecting solutions.    HPI  Pt presents to discuss depression medication   Wt Readings from Last 3 Encounters:  02/23/20 237 lb 6 oz (107.7 kg)  12/26/19 236 lb 3 oz (107.1 kg)  12/21/19 238 lb 6.4 oz (108.1 kg)   40.75 kg/m  She has been seeing a therapist  Discussed medication (thinks she needed it)  Has gyn surgery planned in December -hysterectomy  Main problem has been poor sleep for several months  This is on /off  Sleeps 5 h and wakes up /in overdrive   Sad/blue Not really anxious   Stress: work- contributing to that as well  Supply issues at [employer]  Sometimes feels hopeless   (especially when she is really tired)  Blah , does not enjoy things as much  Motivation wanes     (for instance wanted to clean room and move some furniture- start and then looses interest)   Support - therapist and a few friends (one of them gets her out of the house)   Appetite -about the same   Exercise - none besides work / aims to start walking but needs a time to organize it  Wants to get outside  May do it at lunch break   Never been on medication in the past  No problems as a kid of teen  Most of her problems started when she got divorced and then dad died  A lot of adjustment   Sluggish and slowed down Not really anxious   Patient Active Problem List   Diagnosis Date Noted   Dysthymia 12/26/2019   Fibroid uterus 12/26/2019   Right leg swelling 12/11/2019   Fungal nail infection 10/11/2019   Contraception management 10/11/2019   Great toe pain, right 12/05/2018   Left anterior knee pain  04/26/2018   Injury of left toe 01/25/2018   Left hand paresthesia 11/22/2017   GERD (gastroesophageal reflux disease) 11/22/2017   H/O vaginitis 08/10/2017   Bell's palsy 07/07/2017   Headache 07/07/2017   Hair loss 03/25/2016   Vitamin D deficiency 01/15/2016   Routine general medical examination at a health care facility 05/29/2014   Encounter for routine gynecological examination 05/29/2014   Screening for HIV (human immunodeficiency virus) 05/29/2014   Encounter for screening mammogram for breast cancer 05/29/2014   Joint pain 04/10/2014   History of DVT (deep vein thrombosis) 07/06/2012   Varicose veins of lower extremities with other complications 19/37/9024   Venous (peripheral) insufficiency 12/23/2011   Phlebitis and thrombophlebitis of the leg 11/06/2011   Venous insufficiency, peripheral 10/26/2011   Pedal edema 10/26/2011   IRREGULAR MENSES 02/29/2008   TRICHOMONAL VAGINITIS 03/18/2007   Past Medical History:  Diagnosis Date   Arthritis    right hand   DVT (deep venous thrombosis) ([place]) 2013   PONV (postoperative nausea and vomiting)    after cholecystectomy   Sleep apnea    CPAP   Varicose veins    Past Surgical History:  Procedure Laterality Date   CHOLECYSTECTOMY  Feb. 2006   Gall Bladder   HAND SURGERY  2016   NASAL SEPTOPLASTY W/ TURBINOPLASTY  Bilateral 10/01/2017   Procedure: NASAL SEPTOPLASTY WITH SUBMUCOCELE RESECTION OF TURBINATE;  Surgeon: Beverly Gust, MD;  Location: [facility];  Service: ENT;  Laterality: Bilateral;  Sleep apnea   THROMBECTOMY Right 01/13/12 and  01/21/12   Right iliac vein stent and Vein thrombosis   Social History   Tobacco Use   Smoking status: Never Smoker   Smokeless tobacco: Never Used  Vaping Use   Vaping Use: Never used  Substance Use Topics   Alcohol use: No    Alcohol/week: 0.0 standard drinks   Drug use: No   Family History  Problem Relation Age of Onset    Arthritis Mother    Cancer Mother        uterine    Heart disease Father        Heart Disease before age 61   Cancer Father        skin    Hypertension Father    Heart attack Father    Allergies  Allergen Reactions   Penicillins Rash   Sulfa Antibiotics Rash   Current Outpatient Medications on File Prior to Visit  Medication Sig Dispense Refill   Cholecalciferol (VITAMIN D3) 2000 units TABS Take 1 tablet by mouth daily.     NON FORMULARY Support hose to the waist for dx of venous insufficiency and edema 15-20 mm Hg     norethindrone (AYGESTIN) 5 MG tablet Take 1 tablet (5 mg total) by mouth in the morning, at noon, and at bedtime. 90 tablet 2   No current facility-administered medications on file prior to visit.     Review of Systems  Constitutional: Negative for activity change, appetite change, fatigue, fever and unexpected weight change.  HENT: Negative for congestion, ear pain, rhinorrhea, sinus pressure and sore throat.   Eyes: Negative for pain, redness and visual disturbance.  Respiratory: Negative for cough, shortness of breath and wheezing.   Cardiovascular: Negative for chest pain and palpitations.  Gastrointestinal: Negative for abdominal pain, blood in stool, constipation and diarrhea.  Endocrine: Negative for polydipsia and polyuria.  Genitourinary: Negative for dysuria, frequency and urgency.  Musculoskeletal: Negative for arthralgias, back pain and myalgias.  Skin: Negative for pallor and rash.  Allergic/Immunologic: Negative for environmental allergies.  Neurological: Negative for dizziness, syncope and headaches.  Hematological: Negative for adenopathy. Does not bruise/bleed easily.  Psychiatric/Behavioral: Positive for dysphoric mood and sleep disturbance. Negative for decreased concentration, self-injury and suicidal ideas. The patient is not nervous/anxious.        Objective:   Physical Exam Constitutional:      General: She is not in acute  distress.    Appearance: Normal appearance. She is obese. She is not ill-appearing.  Eyes:     Conjunctiva/sclera: Conjunctivae normal.     Pupils: Pupils are equal, round, and reactive to light.  Cardiovascular:     Rate and Rhythm: Normal rate and regular rhythm.  Pulmonary:     Effort: Pulmonary effort is normal. No respiratory distress.  Musculoskeletal:     Cervical back: Normal range of motion.  Skin:    General: Skin is warm and dry.     Coloration: Skin is not jaundiced or pale.     Findings: No erythema.  Neurological:     Mental Status: She is alert.     Cranial Nerves: No cranial nerve deficit.     Coordination: Coordination normal.     Comments: No tremor   Psychiatric:        Attention and  Perception: Attention normal.        Mood and Affect: Mood is depressed. Affect is blunt.        Speech: Speech normal.        Behavior: Behavior normal.        Thought Content: Thought content is not paranoid. Thought content does not include suicidal ideation.        Cognition and Memory: Cognition and memory normal.     Comments: Generally down  Good insight  Almost tearful at times            Assessment & Plan:   Problem List Items Addressed This Visit      Other   Dysthymia    Pt is in counseling   Lot of change recently and planning a hysterectomy  Reviewed stressors/ coping techniques/symptoms/ support sources/ tx options and side effects in detail today Sluggish/sad and unmotivated    No SI Will try fluoxetine 10 mg daily for 2 wk If well tolerated go up to 20 mg daily  Discussed expectations of SSRI medication including time to effectiveness and mechanism of action, also poss of side effects (early and late)- including mental fuzziness, weight or appetite change, nausea and poss of worse dep or anxiety (even suicidal thoughts)  Pt voiced understanding and will stop med and update if this occurs   Enc exercise and outdoor time  Enc self care  For sleep- low  dose hydroxyzine px prn (disc sedation with this)  As mood improves I suspect sleep will improve also  F/u 4-6 wk or earlier if needed      Relevant Medications   FLUoxetine (PROZAC) 10 MG capsule   hydrOXYzine (ATARAX/VISTARIL) 10 MG tablet

## 2020-03-04 ENCOUNTER — Ambulatory Visit (INDEPENDENT_AMBULATORY_CARE_PROVIDER_SITE_OTHER): Payer: 59 | Admitting: Psychology

## 2020-03-04 DIAGNOSIS — F321 Major depressive disorder, single episode, moderate: Secondary | ICD-10-CM

## 2020-03-05 ENCOUNTER — Ambulatory Visit (INDEPENDENT_AMBULATORY_CARE_PROVIDER_SITE_OTHER): Payer: Managed Care, Other (non HMO) | Admitting: Obstetrics and Gynecology

## 2020-03-05 ENCOUNTER — Other Ambulatory Visit
Admission: RE | Admit: 2020-03-05 | Discharge: 2020-03-05 | Disposition: A | Discharge status: Home / Self Care | Payer: Managed Care, Other (non HMO) | Source: Ambulatory Visit | Attending: Obstetrics and Gynecology | Admitting: Obstetrics and Gynecology

## 2020-03-05 ENCOUNTER — Encounter: Payer: Self-pay | Admitting: Obstetrics and Gynecology

## 2020-03-05 ENCOUNTER — Other Ambulatory Visit: Payer: Self-pay

## 2020-03-05 VITALS — BP 130/72 | Ht 64.0 in | Wt 237.4 lb

## 2020-03-05 DIAGNOSIS — N921 Excessive and frequent menstruation with irregular cycle: Secondary | ICD-10-CM | POA: Diagnosis not present

## 2020-03-05 DIAGNOSIS — N939 Abnormal uterine and vaginal bleeding, unspecified: Secondary | ICD-10-CM | POA: Diagnosis not present

## 2020-03-05 DIAGNOSIS — D259 Leiomyoma of uterus, unspecified: Secondary | ICD-10-CM

## 2020-03-05 DIAGNOSIS — D252 Subserosal leiomyoma of uterus: Secondary | ICD-10-CM

## 2020-03-05 DIAGNOSIS — Z01812 Encounter for preprocedural laboratory examination: Secondary | ICD-10-CM | POA: Insufficient documentation

## 2020-03-05 HISTORY — DX: Gastro-esophageal reflux disease without esophagitis: K21.9

## 2020-03-05 NOTE — H&P (View-Only) (Signed)
Patient ID: Kari Rice, female   DOB: [birth year], 44 y.o.   MRN: [id number]  Reason for Consult: Pre-op Exam   Referred by Abner Greenspan, MD  Subjective:     HPI:  Kari Rice is a 44 y.o. female. She presents today for her preoperative visit. She has been struggling with menorrhagia and evaluation showed an enlarge fibroid uterus. Her bleeding has been controlled with Provera which she continues to take daily.   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  Endometrial biopsy showed inactive endometrium on 11/29/2019.    Past Medical History:  Diagnosis Date  . Arthritis    right hand  . DVT (deep venous thrombosis) ([place]) 2013  . GERD (gastroesophageal reflux disease)   . PONV (postoperative nausea and vomiting)    after cholecystectomy  . Sleep apnea    CPAP  . Varicose veins    Family History  Problem Relation Age of Onset  . Arthritis Mother   . Cancer Mother        uterine   . Heart disease Father        Heart Disease before age 68  . Cancer Father        skin   . Hypertension Father   . Heart attack Father    Past Surgical History:  Procedure Laterality Date  . CHOLECYSTECTOMY  Feb. 2006   Gall Bladder  . HAND SURGERY  2016  . NASAL SEPTOPLASTY W/ TURBINOPLASTY Bilateral 10/01/2017   Procedure: NASAL SEPTOPLASTY WITH SUBMUCOCELE RESECTION OF TURBINATE;  Surgeon: Beverly Gust, MD;  Location: [place];  Service: ENT;  Laterality: Bilateral;  Sleep apnea  . THROMBECTOMY Right 01/13/12 and  01/21/12   Right iliac vein stent and Vein thrombosis    Short Social History:  Social History   Tobacco Use  . Smoking status: Never Smoker  . Smokeless tobacco: Never Used  Substance Use Topics  . Alcohol use: No    Alcohol/week: 0.0 standard drinks    Allergies  Allergen Reactions  . Penicillins Rash  . Sulfa Antibiotics Rash    Current Outpatient Medications  Medication Sig Dispense Refill  . acetaminophen  (TYLENOL) 650 MG CR tablet Take 1,300 mg by mouth every 8 (eight) hours as needed for pain.    . Cholecalciferol (VITAMIN D3) 2000 units TABS Take 1 tablet by mouth daily.    Marland Kitchen FLUoxetine (PROZAC) 10 MG capsule Take 1 capsule (10 mg total) by mouth daily. 30 capsule 1  . NON FORMULARY Support hose to the waist for dx of venous insufficiency and edema 15-20 mm Hg    . omeprazole (PRILOSEC) 20 MG capsule Take 20 mg by mouth daily.    . hydrOXYzine (ATARAX/VISTARIL) 10 MG tablet Take 1 tablet (10 mg total) by mouth at bedtime as needed (insomnia). (Patient taking differently: Take 10 mg by mouth at bedtime. ) 30 tablet 1  . norethindrone (AYGESTIN) 5 MG tablet Take 1 tablet (5 mg total) by mouth in the morning, at noon, and at bedtime. (Patient taking differently: Take 5 mg by mouth daily. ) 90 tablet 2   No current facility-administered medications for this visit.    Review of Systems  Constitutional: Negative for chills, fatigue, fever and unexpected weight change.  HENT: Negative for trouble swallowing.  Eyes: Negative for loss of vision.  Respiratory: Negative for cough, shortness of breath and wheezing.  Cardiovascular: Negative for chest pain, leg swelling, palpitations and syncope.  GI:  Negative for abdominal pain, blood in stool, diarrhea, nausea and vomiting.  GU: Negative for difficulty urinating, dysuria, frequency and hematuria.  Musculoskeletal: Negative for back pain, leg pain and joint pain.  Skin: Negative for rash.  Neurological: Negative for dizziness, headaches, light-headedness, numbness and seizures.  Psychiatric: Negative for behavioral problem, confusion, depressed mood and sleep disturbance.        Objective:  Objective   Vitals:   03/05/20 1056  BP: 130/72  Weight: 237 lb 6.4 oz (107.7 kg)  Height: 5\' 4"  (1.626 m)   Body mass index is 40.75 kg/m.  Physical Exam Vitals and nursing note reviewed.  Constitutional:      Appearance: She is well-developed.    HENT:     Head: Normocephalic and atraumatic.  Eyes:     Pupils: Pupils are equal, round, and reactive to light.  Cardiovascular:     Rate and Rhythm: Normal rate and regular rhythm.  Pulmonary:     Effort: Pulmonary effort is normal. No respiratory distress.  Skin:    General: Skin is warm and dry.  Neurological:     Mental Status: She is alert and oriented to person, place, and time.  Psychiatric:        Behavior: Behavior normal.        Thought Content: Thought content normal.        Judgment: Judgment normal.     Assessment/Plan:    44 yo with fibroid uterus and menorrhagia Plan for robotic laparoscopic hysterectomy with bilateral salpingectomy and cystoscopy  I have had a careful discussion with this patient about all the options available and the risk/benefits of each. I have fully informed this patient that a hysterectomy may subject her to a variety of discomforts and risks: She understands that most patients have surgery with little difficulty, but problems can happen ranging from minor to fatal. These include nausea, vomiting, pain, bleeding, infection, poor healing, hernia, wound separation, vaginal cuff separation or formation of adhesions. Unexpected reactions may occur from any drug or anesthetic given. Unintended injury may occur to other pelvic or abdominal structures such as Fallopian tubes, ovaries, bladder, ureter (tube from kidney to bladder), or bowel. Nerves going from the pelvis to the legs may be injured. Any such injury may require immediate or later additional surgery to correct the problem. Excessive blood loss requiring transfusion is very unlikely but possible. Dangerous blood clots may form in the legs or lungs. Physical and sexual activity will be restricted in varying degrees for an indeterminate period of time but most often 2-4 weeks. She understands that the plan is to do this laparoscopically, however, there is a chance that this will need to be performed  via a larger incision. She may be hospitalized overnight. Finally, she understands that it is impossible to list every possible undesirable effect and that the condition for which surgery is done is not always cured or significantly improved, and in rare cases may be even worse. I have also counseled her extensively about the pros and cons of ovarian conservation versus removal. Ample time was given to answer all questions.  More than 25 minutes were spent face to face with the patient in the room, reviewing the medical record, labs and images, and coordinating care for the patient. The plan of management was discussed in detail and counseling was provided.     Adrian Prows MD [facility], [facility] 03/05/2020 11:33 AM

## 2020-03-05 NOTE — Progress Notes (Signed)
Patient ID: Kari Rice, female   DOB: 29-Dec-1975, 44 y.o.   MRN: [id number]  Reason for Consult: Pre-op Exam   Referred by Abner Greenspan, MD  Subjective:     HPI:  Kari Rice is a 44 y.o. female. She presents today for her preoperative visit. She has been struggling with menorrhagia and evaluation showed an enlarge fibroid uterus. Her bleeding has been controlled with Provera which she continues to take daily.   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  Endometrial biopsy showed inactive endometrium on 11/29/2019.    Past Medical History:  Diagnosis Date  . Arthritis    right hand  . DVT (deep venous thrombosis) ([place]) 2013  . GERD (gastroesophageal reflux disease)   . PONV (postoperative nausea and vomiting)    after cholecystectomy  . Sleep apnea    CPAP  . Varicose veins    Family History  Problem Relation Age of Onset  . Arthritis Mother   . Cancer Mother        uterine   . Heart disease Father        Heart Disease before age 53  . Cancer Father        skin   . Hypertension Father   . Heart attack Father    Past Surgical History:  Procedure Laterality Date  . CHOLECYSTECTOMY  Feb. 2006   Gall Bladder  . HAND SURGERY  2016  . NASAL SEPTOPLASTY W/ TURBINOPLASTY Bilateral 10/01/2017   Procedure: NASAL SEPTOPLASTY WITH SUBMUCOCELE RESECTION OF TURBINATE;  Surgeon: Beverly Gust, MD;  Location: [place];  Service: ENT;  Laterality: Bilateral;  Sleep apnea  . THROMBECTOMY Right 01/13/12 and  01/21/12   Right iliac vein stent and Vein thrombosis    Short Social History:  Social History   Tobacco Use  . Smoking status: Never Smoker  . Smokeless tobacco: Never Used  Substance Use Topics  . Alcohol use: No    Alcohol/week: 0.0 standard drinks    Allergies  Allergen Reactions  . Penicillins Rash  . Sulfa Antibiotics Rash    Current Outpatient Medications  Medication Sig Dispense Refill  . acetaminophen  (TYLENOL) 650 MG CR tablet Take 1,300 mg by mouth every 8 (eight) hours as needed for pain.    . Cholecalciferol (VITAMIN D3) 2000 units TABS Take 1 tablet by mouth daily.    Marland Kitchen FLUoxetine (PROZAC) 10 MG capsule Take 1 capsule (10 mg total) by mouth daily. 30 capsule 1  . NON FORMULARY Support hose to the waist for dx of venous insufficiency and edema 15-20 mm Hg    . omeprazole (PRILOSEC) 20 MG capsule Take 20 mg by mouth daily.    . hydrOXYzine (ATARAX/VISTARIL) 10 MG tablet Take 1 tablet (10 mg total) by mouth at bedtime as needed (insomnia). (Patient taking differently: Take 10 mg by mouth at bedtime. ) 30 tablet 1  . norethindrone (AYGESTIN) 5 MG tablet Take 1 tablet (5 mg total) by mouth in the morning, at noon, and at bedtime. (Patient taking differently: Take 5 mg by mouth daily. ) 90 tablet 2   No current facility-administered medications for this visit.    Review of Systems  Constitutional: Negative for chills, fatigue, fever and unexpected weight change.  HENT: Negative for trouble swallowing.  Eyes: Negative for loss of vision.  Respiratory: Negative for cough, shortness of breath and wheezing.  Cardiovascular: Negative for chest pain, leg swelling, palpitations and syncope.  GI:  Negative for abdominal pain, blood in stool, diarrhea, nausea and vomiting.  GU: Negative for difficulty urinating, dysuria, frequency and hematuria.  Musculoskeletal: Negative for back pain, leg pain and joint pain.  Skin: Negative for rash.  Neurological: Negative for dizziness, headaches, light-headedness, numbness and seizures.  Psychiatric: Negative for behavioral problem, confusion, depressed mood and sleep disturbance.        Objective:  Objective   Vitals:   03/05/20 1056  BP: 130/72  Weight: 237 lb 6.4 oz (107.7 kg)  Height: 5\' 4"  (1.626 m)   Body mass index is 40.75 kg/m.  Physical Exam Vitals and nursing note reviewed.  Constitutional:      Appearance: She is well-developed.    HENT:     Head: Normocephalic and atraumatic.  Eyes:     Pupils: Pupils are equal, round, and reactive to light.  Cardiovascular:     Rate and Rhythm: Normal rate and regular rhythm.  Pulmonary:     Effort: Pulmonary effort is normal. No respiratory distress.  Skin:    General: Skin is warm and dry.  Neurological:     Mental Status: She is alert and oriented to person, place, and time.  Psychiatric:        Behavior: Behavior normal.        Thought Content: Thought content normal.        Judgment: Judgment normal.     Assessment/Plan:    44 yo with fibroid uterus and menorrhagia Plan for robotic laparoscopic hysterectomy with bilateral salpingectomy and cystoscopy  I have had a careful discussion with this patient about all the options available and the risk/benefits of each. I have fully informed this patient that a hysterectomy may subject her to a variety of discomforts and risks: She understands that most patients have surgery with little difficulty, but problems can happen ranging from minor to fatal. These include nausea, vomiting, pain, bleeding, infection, poor healing, hernia, wound separation, vaginal cuff separation or formation of adhesions. Unexpected reactions may occur from any drug or anesthetic given. Unintended injury may occur to other pelvic or abdominal structures such as Fallopian tubes, ovaries, bladder, ureter (tube from kidney to bladder), or bowel. Nerves going from the pelvis to the legs may be injured. Any such injury may require immediate or later additional surgery to correct the problem. Excessive blood loss requiring transfusion is very unlikely but possible. Dangerous blood clots may form in the legs or lungs. Physical and sexual activity will be restricted in varying degrees for an indeterminate period of time but most often 2-4 weeks. She understands that the plan is to do this laparoscopically, however, there is a chance that this will need to be performed  via a larger incision. She may be hospitalized overnight. Finally, she understands that it is impossible to list every possible undesirable effect and that the condition for which surgery is done is not always cured or significantly improved, and in rare cases may be even worse. I have also counseled her extensively about the pros and cons of ovarian conservation versus removal. Ample time was given to answer all questions.  More than 25 minutes were spent face to face with the patient in the room, reviewing the medical record, labs and images, and coordinating care for the patient. The plan of management was discussed in detail and counseling was provided.     Adrian Prows MD [facility], [facility] 03/05/2020 11:33 AM

## 2020-03-05 NOTE — Progress Notes (Signed)
PRE OP visit.

## 2020-03-05 NOTE — Patient Instructions (Signed)
Your procedure is scheduled on: Wednesday 03/13/20.  Report to THE FIRST FLOOR REGISTRATION DESK IN THE MEDICAL MALL ON THE MORNING OF SURGERY FIRST, THEN YOU WILL CHECK IN AT THE SURGERY INFORMATION DESK LOCATED OUTSIDE THE SAME DAY SURGERY DEPARTMENT LOCATED ON 2ND FLOOR MEDICAL MALL ENTRANCE.  To find out your arrival time please call [phone] between 1PM - 3PM on Tuesday 03/12/20.  Remember: Instructions that are not followed completely may result in serious medical risk, up to and including death, or upon the discretion of your surgeon and anesthesiologist your surgery may need to be rescheduled.     __X__ 1. Do not eat food after midnight the night before your procedure.                 No gum chewing or hard candies. You may drink clear liquids up to 2 hours                 before you are scheduled to arrive for your surgery- DO NOT drink clear                 liquids within 2 hours of the start of your surgery.                 Clear Liquids include:  water, apple juice without pulp, clear carbohydrate                 drink such as Clearfast or Gatorade, Black Coffee or Tea (Do not add                 milk or creamer to coffee or tea).   ** Dr. Gilman Schmidt would like for you to finish the Clear Pre-Surgery Ensure 2 hours prior to your arrival time on the morning of surgery. **   __X__2.  On the morning of surgery brush your teeth with toothpaste and water, you may rinse your mouth with mouthwash if you wish.  Do not swallow any toothpaste or mouthwash.    __X__ 3.  No Alcohol for 24 hours before or after surgery.  __X__ 4.  Do Not Smoke or use e-cigarettes For 24 Hours Prior to Your Surgery.                 Do not use any chewable tobacco products for at least 6 hours prior to                 surgery.  __X__5.  Notify your doctor if there is any change in your medical condition      (cold, fever, infections).      Do NOT wear jewelry, make-up, hairpins, clips or nail  polish. Do NOT wear lotions, powders, or perfumes.  Do NOT shave 48 hours prior to surgery. Men may shave face and neck. Do NOT bring valuables to the hospital.     [facility] is not responsible for any belongings or valuables.   Contacts, dentures/partials or body piercings may not be worn into surgery. Bring a case for your contacts, glasses or hearing aids, a denture cup will be supplied.  Leave your suitcase in the car. After surgery it may be brought to your room.   For patients admitted to the hospital, discharge time is determined by your treatment team.    Patients discharged the day of surgery will not be allowed to drive home.     __X__ Take these medicines the morning of surgery with A SIP OF  WATER:     1. FLUoxetine (PROZAC)  2. omeprazole (PRILOSEC)     __X__ Use CHG Soap/SAGE wipes as directed  __X__ Take 1/2 of usual insulin dose the night before surgery. No insulin the morning of surgery.   __X__ Stop Anti-inflammatories 7 days before surgery such as Advil, Ibuprofen, Motrin, BC or Goodies Powder, Naprosyn, Naproxen, Aleve, Aspirin, Meloxicam. May take Tylenol if needed for pain or discomfort.   __X__Do not start taking any new herbal supplements or vitamins prior to your procedure.    Wear comfortable clothing (specific to your surgery type) to the hospital.  Plan for stool softeners for home use; pain medications have a tendency to cause constipation. You can also help prevent constipation by eating foods high in fiber such as fruits and vegetables and drinking plenty of fluids as your diet allows.  After surgery, you can prevent lung complications by doing breathing exercises.Take deep breaths and cough every 1-2 hours. Your doctor may order a device called an Incentive Spirometer to help you take deep breaths.  Please call the [place] Department at [phone] if you have any questions about these instructions.

## 2020-03-05 NOTE — Patient Instructions (Signed)
Hysterectomy Information  A hysterectomy is a surgery in which the uterus is removed. The fallopian tubes and ovaries may be removed (bilateral salpingo-oophorectomy) as well. This procedure may be done to treat various medical problems. After the procedure, a woman will no longer have menstrual periods nor will she be able to become pregnant (sterile). What are the reasons for a hysterectomy? There are many reasons why a woman might have this procedure. They include:  Persistent, abnormal vaginal bleeding.  Long-term (chronic) pelvic pain or infection.  Endometriosis. This is when the lining of the uterus (endometrium) starts to grow outside the uterus.  Adenomyosis. This is when the endometrium starts to grow in the muscle of the uterus.  Pelvic organ prolapse. This is a condition in which the uterus falls down into the vagina.  Noncancerous growths in the uterus (uterine fibroids) that cause symptoms.  The presence of precancerous cells.  Cervical or uterine cancer. What are the different types of hysterectomy? There are three different types of hysterectomy:  Supracervical hysterectomy. In this type, the top part of the uterus is removed, but not the cervix.  Total hysterectomy. In this type, the uterus and cervix are removed.  Radical hysterectomy. In this type, the uterus, the cervix, and the tissue that holds the uterus in place (parametrium) are removed. What are the different ways a hysterectomy can be performed? There are many different ways a hysterectomy can be performed, including:  Abdominal hysterectomy. In this type, an incision is made in the abdomen. The uterus is removed through this incision.  Vaginal hysterectomy. In this type, an incision is made in the vagina. The uterus is removed through this incision. There are no abdominal incisions.  Conventional laparoscopic hysterectomy. In this type, three or four small incisions are made in the abdomen. A thin,  lighted tube with a camera (laparoscope) is inserted into one of the incisions. Other tools are put through the other incisions. The uterus is cut into small pieces. The small pieces are removed through the incisions or through the vagina.  Laparoscopically assisted vaginal hysterectomy (LAVH). In this type, three or four small incisions are made in the abdomen. Part of the surgery is performed laparoscopically and the other part is done vaginally. The uterus is removed through the vagina.  Robot-assisted laparoscopic hysterectomy. In this type, a laparoscope and other tools are inserted into three or four small incisions in the abdomen. A computer-controlled device is used to give the surgeon a 3D image and to help control the surgical instruments. This allows for more precise movements of surgical instruments. The uterus is cut into small pieces and removed through the incisions or removed through the vagina. Discuss the options with your health care provider to determine which type is the right one for you. What are the risks? Generally, this is a safe procedure. However, problems may occur, including:  Bleeding and risk of blood transfusion. Tell your health care provider if you do not want to receive any blood products.  Blood clots in the legs or lung.  Infection.  Damage to other structures or organs.  Allergic reactions to medicines.  Changing to an abdominal hysterectomy from one of the other techniques. What to expect after a hysterectomy  You will be given pain medicine.  You may need to stay in the hospital for 1- 2 days to recover, depending on the type of hysterectomy you had.  Follow your health care provider's instructions about exercise, driving, and general activities. Ask your   health care provider what activities are safe for you.  You will need to have someone with you for the first 3-5 days after you go home.  You will need to follow up with your surgeon in 2-4  weeks after surgery to evaluate your progress.  If the ovaries are removed, you will have early menopause symptoms such as hot flashes, night sweats, and insomnia.  If you had a hysterectomy for a problem that was not cancer or not a condition that could lead to cancer, then you no longer need Pap tests. However, even if you no longer need a Pap test, a regular pelvic exam is a good idea to make sure no other problems are developing. Questions to ask your health care provider  Is a hysterectomy medically necessary? Do I have other treatment options for my condition?  What are my options for hysterectomy procedure?  What organs and tissues need to be removed?  What are the risks?  What are the benefits?  How long will I need to stay in the hospital after the procedure?  How long will I need to recover at home?  What symptoms can I expect after the procedure? Summary  A hysterectomy is a surgery in which the uterus is removed. The fallopian tubes and ovaries may be removed (bilateral salpingo-oophorectomy) as well.  This procedure may be done to treat various medical problems. After the procedure, a woman will no longer have menstrual periods nor will she be able to become pregnant.  Discuss the options with your health care provider to determine which type of hysterectomy is the right one for you. This information is not intended to replace advice given to you by your health care provider. Make sure you discuss any questions you have with your health care provider. Document Revised: 03/12/2017 Document Reviewed: 05/06/2016 Elsevier Patient Education  2020 [employer].

## 2020-03-06 ENCOUNTER — Encounter: Payer: Managed Care, Other (non HMO) | Admitting: Obstetrics and Gynecology

## 2020-03-11 ENCOUNTER — Other Ambulatory Visit: Payer: Self-pay

## 2020-03-11 ENCOUNTER — Other Ambulatory Visit
Admission: RE | Admit: 2020-03-11 | Discharge: 2020-03-11 | Disposition: A | Discharge status: Home / Self Care | Payer: Managed Care, Other (non HMO) | Source: Ambulatory Visit | Attending: Obstetrics and Gynecology | Admitting: Obstetrics and Gynecology

## 2020-03-11 DIAGNOSIS — Z01812 Encounter for preprocedural laboratory examination: Secondary | ICD-10-CM | POA: Insufficient documentation

## 2020-03-11 DIAGNOSIS — Z20822 Contact with and (suspected) exposure to covid-19: Secondary | ICD-10-CM | POA: Insufficient documentation

## 2020-03-11 LAB — CBC
HCT: 45.3 % (ref 36.0–46.0)
Hemoglobin: 14.7 g/dL (ref 12.0–15.0)
MCH: 27.3 pg (ref 26.0–34.0)
MCHC: 32.5 g/dL (ref 30.0–36.0)
MCV: 84.2 fL (ref 80.0–100.0)
Platelets: 328 10*3/uL (ref 150–400)
RBC: 5.38 MIL/uL — ABNORMAL HIGH (ref 3.87–5.11)
RDW: 13.9 % (ref 11.5–15.5)
WBC: 8.3 10*3/uL (ref 4.0–10.5)
nRBC: 0 % (ref 0.0–0.2)

## 2020-03-11 LAB — SARS CORONAVIRUS 2 (TAT 6-24 HRS): SARS Coronavirus 2: NEGATIVE

## 2020-03-11 LAB — TYPE AND SCREEN
ABO/RH(D): O POS
Antibody Screen: NEGATIVE

## 2020-03-11 NOTE — Progress Notes (Signed)
[facility] Perioperative Services: Pre-Admission/Anesthesia Testing   Date: 03/11/20 Name: Kari Rice MRN:   [id number]  Re: Consideration of preoperative prophylactic antibiotic change   Request sent to: Homero Fellers, MD (routed and/or faxed via [facility])  Planned Surgical Procedure(s):    Case: [id number] Date/Time: 03/13/20 0815   Procedure: XI ROBOTIC ASSISTED LAPAROSCOPIC HYSTERECTOMY AND SALPINGECTOMY (Bilateral )   Anesthesia type: Choice   Pre-op diagnosis: enlarged fibroid uterus   Location: [facility] OR ROOM 04 / [place] [facility] FOR ANESTHESIA GROUP   Surgeons: Homero Fellers, MD     Notes: 1. Patient has a documented allergy to PCN  . Advising that PCN has caused her to experience low severity rash in the past (> 20 years ago).   2. Screened as appropriate for cephalosporin use during medication reconciliation . No immediate angioedema, dysphagia, SOB, anaphylaxis symptoms. . No severe rash involving mucous membranes or skin necrosis. . No hospital admissions related to side effects of PCN/cephalosporin use.  . No documented reaction to PCN or cephalosporin in the last 10 years.  Request:  As an evidence based approach to reducing the rate of incidence for post-operative SSI and the development of MDROs, could an agent with narrower coverage for preoperative prophylaxis in this patient's upcoming surgical course be considered?  1. Currently ordered preoperative prophylactic ABX: clindamycin + gentamicin.   2. Specifically requesting change to cephalosporin (CEFAZOLIN or CEFOTETAN).   3. Please communicate decision with me and I will change the orders in Epic as per your direction.   Things to consider:  Many patients report that they were "allergic" to PCN earlier in life, however this does not translate into a true lifelong allergy. Patients can lose sensitivity to specific IgE antibodies over time if PCN is avoided (Kleris & Lugar, 2019).     Up to 10% of the adult population and 15% of hospitalized patients report an allergy to PCN, however clinical studies suggest that 90% of those reporting an allergy can tolerate PCN antibiotics (Kleris & Lugar, 2019).   Cross-sensitivity between PCN and cephalosporins has been documented as being as high as 10%, however this estimation included data believed to have been collected in a setting where there was contamination. Newer data suggests that the prevalence of cross-sensitivity between PCN and cephalosporins is actually estimated to be closer to 1% (Hermanides et al., 2018).    Patients labeled as PCN allergic, whether they are truly allergic or not, have been found to have inferior outcomes in terms of rates of serious infection, and these patients tend to have longer hospital stays ([place], 2019).   Treatment related secondary infections, such as Clostridioides difficile, have been linked to the improper use of broad spectrum antibiotics in patients improperly labeled as PCN allergic (Kleris & Lugar, 2019).   Anaphylaxis from cephalosporins is rare and the evidence suggests that there is no increased risk of an anaphylactic type reaction when cephalosporins are used in a PCN allergic patient (Pichichero, 2006).  Citations: Hermanides J, Lemkes BA, Prins Pearla Dubonnet MW, Terreehorst I. Presumed ?-Lactam Allergy and Cross-reactivity in the Operating Theater: A Practical Approach. Anesthesiology. 2018 Aug;129(2):335-342. doi: 10.1097/ALN.0000000000002252. PMID: 16967893.  Kleris, [place]., & Lugar, P. L. (2019). Things We Do For No Reason: Failing to Question a Penicillin Allergy History. Journal of hospital medicine, 14(10), [phone]. Advance online publication. https://www.wallace-middleton.info/  Pichichero, M. E. (2006). Cephalosporins can be prescribed safely for penicillin-allergic patients. Journal of family medicine, 55(2), 106-112. Accessed:  https://cdn.mdedge.com/files/s36fs-public/Document/September-2017/5502JFP_AppliedEvidence1.pdf   Honor Loh, MSN, APRN, FNP-C, CEN [facility]  Peri-operative Services Nurse Practitioner 03/11/20 12:53 PM

## 2020-03-12 ENCOUNTER — Encounter: Payer: Self-pay | Admitting: Family Medicine

## 2020-03-12 MED ORDER — LACTATED RINGERS IV SOLN: INTRAVENOUS | Status: DC

## 2020-03-12 MED ORDER — CHLORHEXIDINE GLUCONATE 0.12 % MT SOLN
15.0000 mL | Freq: Once | OROMUCOSAL | Status: AC
Start: 2020-03-12 — End: 2020-03-13

## 2020-03-12 MED ORDER — POVIDONE-IODINE 10 % EX SWAB: 2.0000 "application " | Freq: Once | CUTANEOUS | Status: DC

## 2020-03-12 MED ORDER — CLINDAMYCIN PHOSPHATE 900 MG/50ML IV SOLN
900.0000 mg | INTRAVENOUS | Status: AC
  Administered 2020-03-13: 900 mg via INTRAVENOUS

## 2020-03-12 MED ORDER — ORAL CARE MOUTH RINSE: 15.0000 mL | Freq: Once | OROMUCOSAL | Status: AC

## 2020-03-12 MED ORDER — GENTAMICIN SULFATE 40 MG/ML IJ SOLN
5.0000 mg/kg | INTRAVENOUS | Status: AC
  Administered 2020-03-13: 538.4 mg via INTRAVENOUS
  Filled 2020-03-12: qty 13.5

## 2020-03-12 MED ORDER — FLUOXETINE HCL 20 MG PO CAPS: 20.0000 mg | ORAL_CAPSULE | Freq: Every day | ORAL | 3 refills | Status: DC

## 2020-03-13 ENCOUNTER — Ambulatory Visit: Payer: Managed Care, Other (non HMO) | Admitting: Urgent Care

## 2020-03-13 ENCOUNTER — Encounter
Admission: RE | Disposition: A | Discharge status: Home / Self Care | Payer: Self-pay | Source: Home / Self Care | Attending: Obstetrics and Gynecology

## 2020-03-13 ENCOUNTER — Encounter: Payer: Self-pay | Admitting: Obstetrics and Gynecology

## 2020-03-13 ENCOUNTER — Ambulatory Visit
Admission: RE | Admit: 2020-03-13 | Discharge: 2020-03-13 | Disposition: A | Discharge status: Home / Self Care | Payer: Managed Care, Other (non HMO) | Attending: Obstetrics and Gynecology | Admitting: Obstetrics and Gynecology

## 2020-03-13 ENCOUNTER — Other Ambulatory Visit: Payer: Self-pay

## 2020-03-13 DIAGNOSIS — D259 Leiomyoma of uterus, unspecified: Secondary | ICD-10-CM

## 2020-03-13 DIAGNOSIS — N72 Inflammatory disease of cervix uteri: Secondary | ICD-10-CM | POA: Insufficient documentation

## 2020-03-13 DIAGNOSIS — Z79899 Other long term (current) drug therapy: Secondary | ICD-10-CM | POA: Insufficient documentation

## 2020-03-13 DIAGNOSIS — N920 Excessive and frequent menstruation with regular cycle: Secondary | ICD-10-CM | POA: Insufficient documentation

## 2020-03-13 DIAGNOSIS — D251 Intramural leiomyoma of uterus: Secondary | ICD-10-CM | POA: Diagnosis not present

## 2020-03-13 HISTORY — PX: ROBOTIC ASSISTED LAPAROSCOPIC HYSTERECTOMY AND SALPINGECTOMY: SHX6379

## 2020-03-13 LAB — POCT URINE PREGNANCY: Preg Test, Ur: NEGATIVE

## 2020-03-13 LAB — ABO/RH: ABO/RH(D): O POS

## 2020-03-13 SURGERY — XI ROBOTIC ASSISTED LAPAROSCOPIC HYSTERECTOMY AND SALPINGECTOMY
Anesthesia: General | Laterality: Bilateral

## 2020-03-13 MED ORDER — KETOROLAC TROMETHAMINE 30 MG/ML IJ SOLN
INTRAMUSCULAR | Status: AC
  Filled 2020-03-13: qty 1

## 2020-03-13 MED ORDER — IBUPROFEN 600 MG PO TABS: 600.0000 mg | ORAL_TABLET | Freq: Four times a day (QID) | ORAL | 3 refills | Status: DC | PRN

## 2020-03-13 MED ORDER — OXYCODONE HCL 5 MG PO TABS
5.0000 mg | ORAL_TABLET | Freq: Once | ORAL | Status: AC | PRN
  Administered 2020-03-13: 5 mg via ORAL

## 2020-03-13 MED ORDER — FENTANYL CITRATE (PF) 100 MCG/2ML IJ SOLN
INTRAMUSCULAR | Status: AC
  Filled 2020-03-13: qty 2

## 2020-03-13 MED ORDER — ACETAMINOPHEN 10 MG/ML IV SOLN
INTRAVENOUS | Status: DC | PRN
  Administered 2020-03-13: 1000 mg via INTRAVENOUS

## 2020-03-13 MED ORDER — MIDAZOLAM HCL 2 MG/2ML IJ SOLN
INTRAMUSCULAR | Status: DC | PRN
  Administered 2020-03-13: 2 mg via INTRAVENOUS

## 2020-03-13 MED ORDER — FENTANYL CITRATE (PF) 100 MCG/2ML IJ SOLN: 25.0000 ug | INTRAMUSCULAR | Status: DC | PRN

## 2020-03-13 MED ORDER — BUPIVACAINE HCL (PF) 0.5 % IJ SOLN
INTRAMUSCULAR | Status: DC | PRN
  Administered 2020-03-13: 15 mL

## 2020-03-13 MED ORDER — EPHEDRINE 5 MG/ML INJ
INTRAVENOUS | Status: AC
  Filled 2020-03-13: qty 10

## 2020-03-13 MED ORDER — BUPIVACAINE HCL (PF) 0.5 % IJ SOLN
INTRAMUSCULAR | Status: AC
  Filled 2020-03-13: qty 30

## 2020-03-13 MED ORDER — ROCURONIUM BROMIDE 100 MG/10ML IV SOLN
INTRAVENOUS | Status: DC | PRN
  Administered 2020-03-13 (×2): 10 mg via INTRAVENOUS
  Administered 2020-03-13: 20 mg via INTRAVENOUS
  Administered 2020-03-13: 50 mg via INTRAVENOUS
  Administered 2020-03-13: 20 mg via INTRAVENOUS

## 2020-03-13 MED ORDER — DEXAMETHASONE SODIUM PHOSPHATE 10 MG/ML IJ SOLN
INTRAMUSCULAR | Status: AC
  Filled 2020-03-13: qty 1

## 2020-03-13 MED ORDER — LIDOCAINE HCL (PF) 2 % IJ SOLN
INTRAMUSCULAR | Status: AC
  Filled 2020-03-13: qty 5

## 2020-03-13 MED ORDER — ROCURONIUM BROMIDE 10 MG/ML (PF) SYRINGE
PREFILLED_SYRINGE | INTRAVENOUS | Status: AC
  Filled 2020-03-13: qty 10

## 2020-03-13 MED ORDER — EPHEDRINE SULFATE 50 MG/ML IJ SOLN
INTRAMUSCULAR | Status: DC | PRN
  Administered 2020-03-13 (×3): 10 mg via INTRAVENOUS

## 2020-03-13 MED ORDER — DEXMEDETOMIDINE (PRECEDEX) IN NS 20 MCG/5ML (4 MCG/ML) IV SYRINGE
PREFILLED_SYRINGE | INTRAVENOUS | Status: DC | PRN
  Administered 2020-03-13 (×5): 4 ug via INTRAVENOUS

## 2020-03-13 MED ORDER — MIDAZOLAM HCL 2 MG/2ML IJ SOLN
INTRAMUSCULAR | Status: AC
  Filled 2020-03-13: qty 2

## 2020-03-13 MED ORDER — LIDOCAINE HCL (CARDIAC) PF 100 MG/5ML IV SOSY
PREFILLED_SYRINGE | INTRAVENOUS | Status: DC | PRN
  Administered 2020-03-13: 100 mg via INTRAVENOUS

## 2020-03-13 MED ORDER — ENOXAPARIN SODIUM 40 MG/0.4ML ~~LOC~~ SOLN: 40.0000 mg | SUBCUTANEOUS | 0 refills | Status: DC

## 2020-03-13 MED ORDER — HEPARIN SODIUM (PORCINE) 5000 UNIT/ML IJ SOLN: 5000.0000 [IU] | Freq: Once | INTRAMUSCULAR | Status: AC

## 2020-03-13 MED ORDER — ACETAMINOPHEN 10 MG/ML IV SOLN
INTRAVENOUS | Status: AC
  Filled 2020-03-13: qty 100

## 2020-03-13 MED ORDER — GLYCOPYRROLATE 0.2 MG/ML IJ SOLN
INTRAMUSCULAR | Status: AC
  Filled 2020-03-13: qty 1

## 2020-03-13 MED ORDER — SCOPOLAMINE 1 MG/3DAYS TD PT72
MEDICATED_PATCH | TRANSDERMAL | Status: AC
  Filled 2020-03-13: qty 1

## 2020-03-13 MED ORDER — PHENYLEPHRINE HCL (PRESSORS) 10 MG/ML IV SOLN
INTRAVENOUS | Status: DC | PRN
  Administered 2020-03-13 (×2): 50 ug via INTRAVENOUS

## 2020-03-13 MED ORDER — FENTANYL CITRATE (PF) 100 MCG/2ML IJ SOLN
INTRAMUSCULAR | Status: DC | PRN
  Administered 2020-03-13: 75 ug via INTRAVENOUS
  Administered 2020-03-13: 50 ug via INTRAVENOUS
  Administered 2020-03-13 (×7): 25 ug via INTRAVENOUS

## 2020-03-13 MED ORDER — SUGAMMADEX SODIUM 500 MG/5ML IV SOLN
INTRAVENOUS | Status: DC | PRN
  Administered 2020-03-13: 220 mg via INTRAVENOUS

## 2020-03-13 MED ORDER — PROPOFOL 500 MG/50ML IV EMUL
INTRAVENOUS | Status: DC | PRN
  Administered 2020-03-13: 150 ug/kg/min via INTRAVENOUS

## 2020-03-13 MED ORDER — SEVOFLURANE IN SOLN
RESPIRATORY_TRACT | Status: AC
  Filled 2020-03-13: qty 250

## 2020-03-13 MED ORDER — PROPOFOL 10 MG/ML IV BOLUS
INTRAVENOUS | Status: AC
  Filled 2020-03-13: qty 20

## 2020-03-13 MED ORDER — GLYCOPYRROLATE 0.2 MG/ML IJ SOLN
INTRAMUSCULAR | Status: DC | PRN
  Administered 2020-03-13: .2 mg via INTRAVENOUS

## 2020-03-13 MED ORDER — KETOROLAC TROMETHAMINE 30 MG/ML IJ SOLN
INTRAMUSCULAR | Status: DC | PRN
  Administered 2020-03-13: 30 mg via INTRAVENOUS

## 2020-03-13 MED ORDER — DEXAMETHASONE SODIUM PHOSPHATE 10 MG/ML IJ SOLN
INTRAMUSCULAR | Status: DC | PRN
  Administered 2020-03-13: 10 mg via INTRAVENOUS

## 2020-03-13 MED ORDER — ONDANSETRON HCL 4 MG/2ML IJ SOLN
INTRAMUSCULAR | Status: DC | PRN
  Administered 2020-03-13: 4 mg via INTRAVENOUS

## 2020-03-13 MED ORDER — SODIUM CHLORIDE 0.9 % IV SOLN
INTRAVENOUS | Status: DC | PRN
  Administered 2020-03-13: 10 ug/min via INTRAVENOUS

## 2020-03-13 MED ORDER — HEMOSTATIC AGENTS (NO CHARGE) OPTIME
TOPICAL | Status: DC | PRN
  Administered 2020-03-13: 1 via TOPICAL

## 2020-03-13 MED ORDER — SUGAMMADEX SODIUM 500 MG/5ML IV SOLN
INTRAVENOUS | Status: AC
  Filled 2020-03-13: qty 5

## 2020-03-13 MED ORDER — PROPOFOL 10 MG/ML IV BOLUS
INTRAVENOUS | Status: DC | PRN
  Administered 2020-03-13: 200 mg via INTRAVENOUS

## 2020-03-13 MED ORDER — CLINDAMYCIN PHOSPHATE 900 MG/50ML IV SOLN
INTRAVENOUS | Status: AC
  Filled 2020-03-13: qty 50

## 2020-03-13 MED ORDER — PROPOFOL 500 MG/50ML IV EMUL
INTRAVENOUS | Status: AC
  Filled 2020-03-13: qty 50

## 2020-03-13 MED ORDER — OXYCODONE HCL 5 MG PO TABS
5.0000 mg | ORAL_TABLET | Freq: Four times a day (QID) | ORAL | 0 refills | Status: AC | PRN
Start: 2020-03-13 — End: 2020-03-20

## 2020-03-13 MED ORDER — OXYCODONE HCL 5 MG PO TABS
ORAL_TABLET | ORAL | Status: AC
  Filled 2020-03-13: qty 1

## 2020-03-13 MED ORDER — PROPOFOL 500 MG/50ML IV EMUL
INTRAVENOUS | Status: AC
  Filled 2020-03-13: qty 100

## 2020-03-13 MED ORDER — HEPARIN SODIUM (PORCINE) 5000 UNIT/ML IJ SOLN
INTRAMUSCULAR | Status: AC
  Administered 2020-03-13: 5000 [IU] via SUBCUTANEOUS
  Filled 2020-03-13: qty 1

## 2020-03-13 MED ORDER — CHLORHEXIDINE GLUCONATE 0.12 % MT SOLN
OROMUCOSAL | Status: AC
  Administered 2020-03-13: 15 mL via OROMUCOSAL
  Filled 2020-03-13: qty 15

## 2020-03-13 MED ORDER — PROMETHAZINE HCL 25 MG/ML IJ SOLN: 6.2500 mg | INTRAMUSCULAR | Status: DC | PRN

## 2020-03-13 MED ORDER — PHENYLEPHRINE HCL (PRESSORS) 10 MG/ML IV SOLN
INTRAVENOUS | Status: AC
  Filled 2020-03-13: qty 1

## 2020-03-13 MED ORDER — SCOPOLAMINE 1 MG/3DAYS TD PT72
1.0000 | MEDICATED_PATCH | TRANSDERMAL | Status: DC
  Administered 2020-03-13: 1.5 mg via TRANSDERMAL

## 2020-03-13 MED ORDER — ONDANSETRON HCL 4 MG/2ML IJ SOLN
INTRAMUSCULAR | Status: AC
  Filled 2020-03-13: qty 2

## 2020-03-13 MED ORDER — OXYCODONE HCL 5 MG/5ML PO SOLN: 5.0000 mg | Freq: Once | ORAL | Status: AC | PRN

## 2020-03-13 SURGICAL SUPPLY — 85 items
ADH SKN CLS APL DERMABOND .7 (GAUZE/BANDAGES/DRESSINGS) ×1
APL PRP STRL LF DISP 70% ISPRP (MISCELLANEOUS) ×1
BAG DRN RND TRDRP ANRFLXCHMBR (UROLOGICAL SUPPLIES) ×1
BAG URINE DRAIN 2000ML AR STRL (UROLOGICAL SUPPLIES) ×3 IMPLANT
BLADE SURG SZ11 CARB STEEL (BLADE) ×3 IMPLANT
CANISTER SUCT 1200ML W/VALVE (MISCELLANEOUS) ×3 IMPLANT
CATH FOLEY 2WAY  5CC 16FR (CATHETERS)
CATH FOLEY 2WAY 5CC 16FR (CATHETERS)
CATH FOLEY SIL 2WAY 12FR5CC (CATHETERS) ×2 IMPLANT
CATH URTH 16FR FL 2W BLN LF (CATHETERS) ×1 IMPLANT
CHLORAPREP W/TINT 26 (MISCELLANEOUS) ×3 IMPLANT
CNTNR SPEC 2.5X3XGRAD LEK (MISCELLANEOUS) ×1
CONT SPEC 4OZ STER OR WHT (MISCELLANEOUS) ×2
CONT SPEC 4OZ STRL OR WHT (MISCELLANEOUS) ×1
CONTAINER SPEC 2.5X3XGRAD LEK (MISCELLANEOUS) ×1 IMPLANT
COVER TIP SHEARS 8 DVNC (MISCELLANEOUS) ×1 IMPLANT
COVER TIP SHEARS 8MM DA VINCI (MISCELLANEOUS) ×6
COVER WAND RF STERILE (DRAPES) ×3 IMPLANT
DEFOGGER SCOPE WARMER CLEARIFY (MISCELLANEOUS) ×3 IMPLANT
DERMABOND ADVANCED (GAUZE/BANDAGES/DRESSINGS) ×2
DERMABOND ADVANCED .7 DNX12 (GAUZE/BANDAGES/DRESSINGS) ×1 IMPLANT
DRAPE 3/4 80X56 (DRAPES) ×9 IMPLANT
DRAPE ARM DVNC X/XI (DISPOSABLE) ×4 IMPLANT
DRAPE COLUMN DVNC XI (DISPOSABLE) ×1 IMPLANT
DRAPE DA VINCI XI ARM (DISPOSABLE) ×12
DRAPE DA VINCI XI COLUMN (DISPOSABLE) ×3
DRESSING SURGICEL FIBRLLR 1X2 (HEMOSTASIS) ×1 IMPLANT
DRSG SURGICEL FIBRILLAR 1X2 (HEMOSTASIS) ×3
ELECT REM PT RETURN 9FT ADLT (ELECTROSURGICAL) ×3
ELECTRODE REM PT RTRN 9FT ADLT (ELECTROSURGICAL) ×1 IMPLANT
EXTRT SYSTEM ALEXIS 17CM (MISCELLANEOUS) ×3
GAUZE 4X4 16PLY RFD (DISPOSABLE) ×3 IMPLANT
GLOVE BIO SURGEON STRL SZ 6.5 (GLOVE) ×12 IMPLANT
GLOVE BIO SURGEONS STRL SZ 6.5 (GLOVE) ×6
GLOVE INDICATOR 7.0 STRL GRN (GLOVE) ×18 IMPLANT
GLOVE PI ORTHOPRO 6.5 (GLOVE) ×4
GLOVE PI ORTHOPRO STRL 6.5 (GLOVE) ×2 IMPLANT
GOWN STRL REUS W/ TWL LRG LVL3 (GOWN DISPOSABLE) ×4 IMPLANT
GOWN STRL REUS W/TWL LRG LVL3 (GOWN DISPOSABLE) ×18
IRRIGATION STRYKERFLOW (MISCELLANEOUS) ×1 IMPLANT
IRRIGATOR STRYKERFLOW (MISCELLANEOUS) ×3
IV NS 1000ML (IV SOLUTION) ×3
IV NS 1000ML BAXH (IV SOLUTION) ×1 IMPLANT
KIT IMAGING PINPOINTPAQ (MISCELLANEOUS) IMPLANT
KIT PINK PAD W/HEAD ARE REST (MISCELLANEOUS) ×3
KIT PINK PAD W/HEAD ARM REST (MISCELLANEOUS) ×1 IMPLANT
KIT TURNOVER CYSTO (KITS) ×3 IMPLANT
LABEL OR SOLS (LABEL) ×3 IMPLANT
MANIFOLD NEPTUNE II (INSTRUMENTS) ×3 IMPLANT
MANIPULATOR VCARE LG CRV RETR (MISCELLANEOUS) ×3 IMPLANT
MANIPULATOR VCARE STD CRV RETR (MISCELLANEOUS) ×3 IMPLANT
NDL INSUFF ACCESS 14 VERSASTEP (NEEDLE) ×3 IMPLANT
NDL INSUFFLATION 14GA 120MM (NEEDLE) IMPLANT
NDL SPNL 22GX3.5 QUINCKE BK (NEEDLE) ×1 IMPLANT
NEEDLE HYPO 22GX1.5 SAFETY (NEEDLE) ×3 IMPLANT
NEEDLE INSUFFLATION 14GA 120MM (NEEDLE) IMPLANT
NEEDLE SPNL 22GX3.5 QUINCKE BK (NEEDLE) ×3 IMPLANT
NS IRRIG 1000ML POUR BTL (IV SOLUTION) ×3 IMPLANT
OCCLUDER COLPOPNEUMO (BALLOONS) ×3 IMPLANT
PACK GYN LAPAROSCOPIC (MISCELLANEOUS) ×3 IMPLANT
PAD ARMBOARD 7.5X6 YLW CONV (MISCELLANEOUS) ×3 IMPLANT
PAD OB MATERNITY 4.3X12.25 (PERSONAL CARE ITEMS) ×3 IMPLANT
PAD PREP 24X41 OB/GYN DISP (PERSONAL CARE ITEMS) ×3 IMPLANT
PENCIL ELECTRO HAND CTR (MISCELLANEOUS) ×3 IMPLANT
SCISSORS METZENBAUM CVD 33 (INSTRUMENTS) ×1 IMPLANT
SEAL CANN UNIV 5-8 DVNC XI (MISCELLANEOUS) ×3 IMPLANT
SEAL XI 5MM-8MM UNIVERSAL (MISCELLANEOUS) ×9
SEALER VESSEL DA VINCI XI (MISCELLANEOUS) ×3
SEALER VESSEL EXT DVNC XI (MISCELLANEOUS) IMPLANT
SET TUBE SMOKE EVAC HIGH FLOW (TUBING) ×1 IMPLANT
SLEEVE VERSASTEP EXPAND ONEST (MISCELLANEOUS) ×7 IMPLANT
SOLUTION ELECTROLUBE (MISCELLANEOUS) ×3 IMPLANT
SUT DVC VLOC 180 0 12IN GS21 (SUTURE) ×6
SUT MNCRL 4-0 (SUTURE) ×3
SUT MNCRL 4-0 27XMFL (SUTURE) ×1
SUT MNCRL AB 4-0 PS2 18 (SUTURE) ×3 IMPLANT
SUT VIC AB 0 CT1 36 (SUTURE) ×3 IMPLANT
SUT VICRYL 0 AB UR-6 (SUTURE) ×5 IMPLANT
SUTURE DVC VLC 180 0 12IN GS21 (SUTURE) ×1 IMPLANT
SUTURE MNCRL 4-0 27XMF (SUTURE) IMPLANT
SYR 10ML LL (SYRINGE) ×3 IMPLANT
SYR 50ML LL SCALE MARK (SYRINGE) ×3 IMPLANT
SYSTEM CONTND EXTRCTN KII BLLN (MISCELLANEOUS) IMPLANT
TROCAR VERSASTEP PLUS 12MM (TROCAR) ×1 IMPLANT
TROCAR XCEL NON-BLD 5MMX100MML (ENDOMECHANICALS) ×3 IMPLANT

## 2020-03-13 NOTE — Interval H&P Note (Signed)
History and Physical Interval Note:  03/13/2020 7:41 AM  Kari Rice Jun  has presented today for surgery, with the diagnosis of enlarged fibroid uterus.  The various methods of treatment have been discussed with the patient and family. After consideration of risks, benefits and other options for treatment, the patient has consented to  Procedure(s): XI ROBOTIC ASSISTED LAPAROSCOPIC HYSTERECTOMY AND SALPINGECTOMY (Bilateral) as a surgical intervention.  The patient's history has been reviewed, patient examined, no change in status, stable for surgery.  I have reviewed the patient's chart and labs.  Questions were answered to the patient's satisfaction.     [place]

## 2020-03-13 NOTE — Anesthesia Preprocedure Evaluation (Addendum)
Anesthesia Evaluation  Patient identified by MRN, date of birth, ID band Patient awake    Reviewed: Allergy & Precautions, H&P , NPO status , Patient's Chart, lab work & pertinent test results  History of Anesthesia Complications (+) PONV  Airway Mallampati: II  TM Distance: >3 FB Neck ROM: full    Dental  (+) Chipped   Pulmonary sleep apnea and Continuous Positive Airway Pressure Ventilation , neg COPD,    breath sounds clear to auscultation       Cardiovascular (-) angina(-) Past MI and (-) Cardiac Stents negative cardio ROS  (-) dysrhythmias  Rhythm:regular Rate:Normal     Neuro/Psych  Headaches, PSYCHIATRIC DISORDERS Depression    GI/Hepatic Neg liver ROS, GERD  ,  Endo/Other  Morbid obesity  Renal/GU      Musculoskeletal   Abdominal   Peds  Hematology negative hematology ROS (+)   Anesthesia Other Findings Past Medical History: No date: Arthritis     Comment:  right hand 2013: DVT (deep venous thrombosis) (HCC) No date: GERD (gastroesophageal reflux disease) No date: PONV (postoperative nausea and vomiting)     Comment:  after cholecystectomy No date: Sleep apnea     Comment:  CPAP No date: Varicose veins  Past Surgical History: Feb. 2006: CHOLECYSTECTOMY     Comment:  Gall Bladder 2016: HAND SURGERY 10/01/2017: NASAL SEPTOPLASTY W/ TURBINOPLASTY; Bilateral     Comment:  Procedure: NASAL SEPTOPLASTY WITH SUBMUCOCELE RESECTION               OF TURBINATE;  Surgeon: Beverly Gust, MD;  Location:               [facility];  Service: ENT;  Laterality:               Bilateral;  Sleep apnea 01/13/12 and  01/21/12: THROMBECTOMY; Right     Comment:  Right iliac vein stent and Vein thrombosis     Reproductive/Obstetrics negative OB ROS                            Anesthesia Physical Anesthesia Plan  ASA: II  Anesthesia Plan: General ETT   Post-op Pain Management:     Induction:   PONV Risk Score and Plan: Ondansetron, Dexamethasone, Midazolam, Treatment may vary due to age or medical condition, Scopolamine patch - Pre-op, Propofol infusion and TIVA  Airway Management Planned:   Additional Equipment:   Intra-op Plan:   Post-operative Plan:   Informed Consent: I have reviewed the patients History and Physical, chart, labs and discussed the procedure including the risks, benefits and alternatives for the proposed anesthesia with the patient or authorized representative who has indicated his/her understanding and acceptance.     Dental Advisory Given  Plan Discussed with: Anesthesiologist, CRNA and Surgeon  Anesthesia Plan Comments:        Anesthesia Quick Evaluation

## 2020-03-13 NOTE — Discharge Instructions (Signed)
Laparoscopically Assisted Vaginal Hysterectomy A laparoscopically assisted vaginal hysterectomy (LAVH) is a surgical procedure to remove the uterus and cervix. Sometimes, the ovaries and fallopian tubes are also removed. This surgery may be done to treat problems such as:  Noncancerous growths in the uterus (uterine fibroids) that cause symptoms.  A condition that causes the lining of the uterus to grow in other areas (endometriosis).  Problems with pelvic support.  Cancer of the cervix, ovaries, uterus, or tissue that lines the uterus (endometrium).  Excessive (dysfunctional) uterine bleeding. During an LAVH, some of the surgical removal is done through the vagina, and the rest is done through a few small incisions in the abdomen. This technique may be an option for women who are not able to have a vaginal hysterectomy. Tell a health care provider about:  Any allergies you have.  All medicines you are taking, including vitamins, herbs, eye drops, creams, and over-the-counter medicines.  Any problems you or family members have had with anesthetic medicines.  Any blood disorders you have.  Any surgeries you have had.  Any medical conditions you have.  Whether you are pregnant or may be pregnant. What are the risks? Generally, this is a safe procedure. However, problems may occur, including:  Infection.  Bleeding.  Allergic reactions to medicines.  Damage to other structures or organs.  Difficulty breathing. What happens before the procedure? Staying hydrated Follow instructions from your health care provider about hydration, which may include:  Up to 2 hours before the procedure - you may continue to drink clear liquids, such as water, clear fruit juice, black coffee, and plain tea. Eating and drinking restrictions Follow instructions from your health care provider about eating and drinking, which may include:  8 hours before the procedure - stop eating heavy meals or  foods such as meat, fried foods, or fatty foods.  6 hours before the procedure - stop eating light meals or foods, such as toast or cereal.  6 hours before the procedure - stop drinking milk or drinks that contain milk.  2 hours before the procedure - stop drinking clear liquids. Medicines  Ask your health care provider about: ? Changing or stopping your regular medicines. This is especially important if you are taking diabetes medicines or blood thinners. ? Taking over-the-counter medicines, vitamins, herbs, and supplements. ? Taking medicines such as aspirin and ibuprofen. These medicines can thin your blood. Do not take these medicines unless your health care provider tells you to take them.  You may be asked to take a medicine to empty your colon (bowel preparation).  You may be given antibiotic medicine to help prevent infection. General instructions  Plan to have someone take you home from the hospital or clinic.  Ask your health care provider how your surgical site will be marked or identified.  You may be asked to shower with a germ-killing soap.  Do not use any products that contain nicotine or tobacco, such as cigarettes and e-cigarettes. These can delay healing after surgery. If you need help quitting, ask your health care provider. What happens during the procedure?  To lower your risk of infection: ? Your health care team will wash or sanitize their hands. ? Hair may be removed from the surgical area. ? Your skin will be washed with soap.  An IV will be inserted into one of your veins.  You will be given one or more of the following: ? A medicine to help you relax (sedative). ? A medicine to  make you fall asleep (general anesthetic).  You may have a flexible tube (catheter) put into your bladder to drain urine.  You may have a tube put through your nose or mouth down into your stomach (nasogastric tube). The nasogastric tube will remove digestive fluids and  prevent nausea and vomiting.  Tight-fitting (compression) stockings will be placed on your legs to promote circulation.  Three or four small incisions will be made in your abdomen. An incision will also be made in your vagina.  Probes and tools will be inserted into the small incisions. The uterus and cervix (and possibly the ovaries and fallopian tubes) will be removed through your vagina as well as through the small incisions that were made in the abdomen.  The incisions will then be closed with stitches (sutures). The procedure may vary among health care providers and hospitals. What happens after the procedure?  Your blood pressure, heart rate, breathing rate, and blood oxygen level will be monitored until the medicines you were given have worn off.  You may have a liquid diet at first. You will most likely return to your usual diet the day after surgery.  You will still have the urinary catheter in place. It will likely be removed the day after surgery.  You may have to wear compression stockings. These stockings help to prevent blood clots and reduce swelling in your legs.  You will be encouraged to walk as soon as possible. You will also use a device or do breathing exercises to keep your lungs clear.  Do not drive for 24 hours if you were given a sedative. Summary  A laparoscopically assisted vaginal hysterectomy (LAVH) is a surgical procedure to remove the uterus and cervix, and sometimes the ovaries and fallopian tubes.  Follow instructions from your health care provider about eating and drinking before the procedure.  During an LAVH, some of the surgical removal is done through the vagina, and the rest is done through a few small incisions in the abdomen. This information is not intended to replace advice given to you by your health care provider. Make sure you discuss any questions you have with your health care provider. Document Revised: 05/23/2018 Document Reviewed:  06/25/2016 Elsevier Patient Education  2020 [place]   1) The drugs that you were given will stay in your system until tomorrow so for the next 24 hours you should not:  A) Drive an automobile B) Make any legal decisions C) Drink any alcoholic beverage   2) You may resume regular meals tomorrow.  Today it is better to start with liquids and gradually work up to solid foods.  You may eat anything you prefer, but it is better to start with liquids, then soup and crackers, and gradually work up to solid foods.   3) Please notify your doctor immediately if you have any unusual bleeding, trouble breathing, redness and pain at the surgery site, drainage, fever, or pain not relieved by medication.  4) Your post-operative visit with Dr.                                     is: Date:                        Time:    Please call to schedule your post-operative visit.  5) Additional Instructions:

## 2020-03-13 NOTE — Anesthesia Procedure Notes (Signed)
Procedure Name: Intubation Date/Time: 03/13/2020 8:37 AM Performed by: Lily Peer, Rodney Wigger, CRNA Pre-anesthesia Checklist: Patient identified, Emergency Drugs available, Suction available and Patient being monitored Patient Re-evaluated:Patient Re-evaluated prior to induction Oxygen Delivery Method: Circle system utilized Preoxygenation: Pre-oxygenation with 100% oxygen Induction Type: IV induction Ventilation: Mask ventilation without difficulty Laryngoscope Size: McGraph and 3 Grade View: Grade I Tube type: Oral Tube size: 7.0 mm Number of attempts: 1 Airway Equipment and Method: Stylet Placement Confirmation: ETT inserted through vocal cords under direct vision,  positive ETCO2 and breath sounds checked- equal and bilateral Secured at: 20 cm Tube secured with: Tape Dental Injury: Teeth and Oropharynx as per pre-operative assessment

## 2020-03-13 NOTE — Op Note (Signed)
Operative Note   Date 03/13/2020 TIME 11:59AM  PRE-OP DIAGNOSIS: Symptomatic leiomyoma    POST-OP DIAGNOSIS: Symptomatic leiomyoma  SURGEON: Surgeon(s) and Role: Panel 1:  Lilla Callejo Gaetana Michaelis, MD  ASSISTANT:  Adrian Prows, MD  ANESTHESIA: General  PROCEDURE: Procedure(s):Robotic assisted total hysterectomy and bilateral salpingectomy with contained manual morcellation within a laparoscopic bag  ESTIMATED BLOOD LOSS: ~25 cc  DRAINS: Foley  TOTAL IV FLUIDS: 1000 cc  UOP: approximately 200 cc  SPECIMENS:  Uterus, cervix, and bilateral fallopian tubes   COMPLICATIONS: None  DISPOSITION: PACU  CONDITION: Stable  INDICATIONS: Symptomatic leiomyoma  FINDINGS: Exam under anesthesia revealed an enlarged 18 week mobile globular and irregular uterus. There were no adnexal masses or nodularity. The parametria was smooth. The cervix was negative for gross lesions. Intraoperative findings included: The uterus was enlarged to 12 cm with an enlongated pedunculated 8-10 cm with leiomyoma. The adnexa were normal bilaterally. The upper abdomen was normal including omentum, bowel, liver, stomach, and diaphragmatic surfaces. There was no evidence of grossly enlarged pelvic lymph nodes.   PROCEDURE IN DETAIL: After informed consent was obtained, the patient was taken to the operating room where anesthesia was obtained without difficulty. The patient was positioned in the dorsal lithotomy position in [place] and her arms were carefully tucked at her sides and the usual precautions were taken.  She was prepped and draped in normal sterile fashion.  Time-out was performed. A 65F Foley catheter was placed using appropriate sterile technique. A speculum was placed in the vagina and the cervical os was dilator. The uterus sounded to 7 cm.  A standard VCare uterine manipulator was then placed in the uterus without incident.    Operative entry was obtained via a supraumbilical incision  using open technique with the assistance of Dr. Gilman Schmidt. The fascia was tagged. The Hasson port was placed, abdomen insuffulated, and pelvis visualized with noted findings above.  The patient was placed in Trendelenburg and the bowel was displaced up into the upper abdomen.  The robotic ports and LUQ port placement, and robotic docking was performed. Bilateral salpingectomy was performed. Round ligaments were divided on each side and the retroperitoneal space was opened bilaterally. The ureters were identified and preserved.  The bilateral utero-ovarian ligaments were sealed and divided.  A bladder flap was created and the bladder was dissected down off the lower uterine segment and cervix. The fibroids uterus was manipulated and the uterine arteries were skeletonized bilaterally, sealed and divided with the vessel sealer.  A colpotomy was performed circumferentially along the V-Care ring with electrocautery and the cervix was incised from the vagina. The V-care was removed. The pendunculated fibroid was dissected off the uterine fundus. The specimens were placed in a bag and brought through the vagina. Contained manual morcellations was performed by Dr. Gardiner Rhyme within the laparoscopic bag and specimens removed completely.  A pneumo balloon was placed in the vagina and the vaginal cuff was then closed in a running continuous fashion using 0 V-Lock suture with careful attention to include the vaginal cuff angles and the vaginal mucosa within the closure.    Hemostasis was observed. The intraperitoneal pressure was dropped, and all planes of dissection, vascular pedicles and the vaginal cuff were found to be hemostatic.  Fibrillar was placed on raw surfaces. =The trocars were removed under direct visualization and the fascia of the umbilical port was closed with 0 Vicryl suture using running technique.   The skin incision at the umbilicus was closed with subcuticular stitch.  The remaining skin incisions were  closed with subcuticular stitch and Indermil glue.  The patient tolerated the procedure well.  Sponge, lap and needle counts were correct x2.  The patient was taken to recovery room in excellent condition.  Antibiotics: Given 1st or 2nd generation cephalosporin, Antibiotics given within 1 hour of the start of the procedure, Antibiotics ordered to be discontinued within 24 hours post procedure   VTE prophylaxis: was ordered perioperatively.   Dr. Gilman Schmidt assisted me with the procedure which could not have been performed without her assistance. This was a high-level case requiring a Insurance risk surveyor. Dr.Schuman performed the initial abdominal entry. She also provided high level assistance throughout the procedure that was needed for resection of the large fibroid uterus and uterine manipulation.   Gillis Ends, MD

## 2020-03-13 NOTE — Transfer of Care (Signed)
Immediate Anesthesia Transfer of Care Note  Patient: Kari Rice  Procedure(s) Performed: XI ROBOTIC ASSISTED LAPAROSCOPIC HYSTERECTOMY AND SALPINGECTOMY (Bilateral )  Patient Location: PACU  Anesthesia Type:General  Level of Consciousness: sedated  Airway & Oxygen Therapy: Patient Spontanous Breathing and Patient connected to face mask oxygen  Post-op Assessment: Report given to RN and Post -op Vital signs reviewed and stable  Post vital signs: Reviewed  Last Vitals:  Vitals Value Taken Time  BP    Temp    Pulse 62 03/13/20 1207  Resp 20 03/13/20 1207  SpO2 97 % 03/13/20 1207  Vitals shown include unvalidated device data.  Last Pain:  Vitals:   03/13/20 0752  TempSrc: Temporal  PainSc: 0-No pain         Complications: No complications documented.

## 2020-03-14 LAB — CYTOLOGY - NON PAP

## 2020-03-15 LAB — SURGICAL PATHOLOGY

## 2020-03-16 ENCOUNTER — Other Ambulatory Visit: Payer: Self-pay | Admitting: Family Medicine

## 2020-03-16 NOTE — Anesthesia Postprocedure Evaluation (Signed)
Anesthesia Post Note  Patient: Kari Rice  Procedure(s) Performed: XI ROBOTIC ASSISTED LAPAROSCOPIC HYSTERECTOMY AND SALPINGECTOMY (Bilateral )  Patient location during evaluation: PACU Anesthesia Type: General Level of consciousness: awake and alert and oriented Pain management: pain level controlled Vital Signs Assessment: post-procedure vital signs reviewed and stable Respiratory status: spontaneous breathing Cardiovascular status: blood pressure returned to baseline Anesthetic complications: no   No complications documented.   Last Vitals:  Vitals:   03/13/20 1238 03/13/20 1259  BP: 112/75 134/68  Pulse: 73 66  Resp: 16 16  Temp:  37.1 C  SpO2: 96% 99%    Last Pain:  Vitals:   03/14/20 1004  TempSrc:   PainSc: 0-No pain                 Cherylene Ferrufino

## 2020-03-18 ENCOUNTER — Ambulatory Visit: Payer: 59 | Admitting: Psychology

## 2020-03-18 NOTE — Telephone Encounter (Signed)
Pharmacy is requesting Rx be changed to a 90 day Rx, last filled on 02/23/20 #30 tabs with 1 refills, f/u is scheduled on 03/29/20, please advise

## 2020-03-20 ENCOUNTER — Ambulatory Visit (INDEPENDENT_AMBULATORY_CARE_PROVIDER_SITE_OTHER): Payer: Managed Care, Other (non HMO) | Admitting: Obstetrics and Gynecology

## 2020-03-20 ENCOUNTER — Encounter: Payer: Self-pay | Admitting: Obstetrics and Gynecology

## 2020-03-20 ENCOUNTER — Other Ambulatory Visit: Payer: Self-pay

## 2020-03-20 VITALS — BP 124/72 | Ht 64.0 in | Wt 227.0 lb

## 2020-03-20 DIAGNOSIS — Z90710 Acquired absence of both cervix and uterus: Secondary | ICD-10-CM

## 2020-03-20 NOTE — Progress Notes (Signed)
  Postoperative Follow-up Patient presents post op from robotic total hysterectomy, bilateral salpingectomy for fibroids, 1 week ago.  Subjective: Patient reports some improvement in her preop symptoms. Eating a regular diet without difficulty. Pain is controlled without any medications.  Activity: normal activities of daily living. Patient reports additional symptom's since surgery of constipation, diarrhea. She has been taking her lovenox.  Objective: BP 124/72   Ht 5\' 4"  (1.626 m)   Wt 227 lb (103 kg)   LMP  (LMP Unknown)   BMI 38.96 kg/m  Physical Exam Constitutional:      Appearance: She is well-developed.  Genitourinary:     Vagina and uterus normal.     No lesions in the vagina.     No cervical motion tenderness.     No right or left adnexal mass present.  HENT:     Head: Normocephalic and atraumatic.  Neck:     Thyroid: No thyromegaly.  Cardiovascular:     Rate and Rhythm: Normal rate and regular rhythm.     Heart sounds: Normal heart sounds.  Pulmonary:     Effort: Pulmonary effort is normal.     Breath sounds: Normal breath sounds.  Chest:     Breasts:        Right: No inverted nipple, mass, nipple discharge or skin change.        Left: No inverted nipple, mass, nipple discharge or skin change.  Abdominal:     General: Abdomen is flat. Bowel sounds are normal. There is no distension.     Palpations: Abdomen is soft. There is no mass.     Comments: Incisions healing well. Clean, dry, intact.  Musculoskeletal:     Cervical back: Neck supple.  Neurological:     Mental Status: She is alert and oriented to person, place, and time.  Skin:    General: Skin is warm and dry.  Psychiatric:        Behavior: Behavior normal.        Thought Content: Thought content normal.        Judgment: Judgment normal.  Vitals reviewed.     Assessment: s/p :  robotic total hysterectomy, bilateral salpingectomy- stable  Plan: Patient has done well after surgery with no  apparent complications.  I have discussed the post-operative course to date, and the expected progress moving forward.  The patient understands what complications to be concerned about.  I will see the patient in routine follow up, or sooner if needed.    Continue lovenox as planned.  Activity plan: No heavy lifting. Pelvic rest.  Meiko Ives R Earline Stiner 03/20/2020, 1:38 PM

## 2020-03-20 NOTE — Progress Notes (Signed)
Post-op followup

## 2020-03-29 ENCOUNTER — Ambulatory Visit: Payer: Managed Care, Other (non HMO) | Admitting: Family Medicine

## 2020-03-29 ENCOUNTER — Ambulatory Visit: Payer: Managed Care, Other (non HMO) | Admitting: Obstetrics and Gynecology

## 2020-03-29 ENCOUNTER — Encounter: Payer: Self-pay | Admitting: Family Medicine

## 2020-03-29 ENCOUNTER — Other Ambulatory Visit: Payer: Self-pay

## 2020-03-29 VITALS — BP 128/74 | HR 73 | Temp 96.9°F | Ht 64.0 in | Wt 227.1 lb

## 2020-03-29 DIAGNOSIS — F341 Dysthymic disorder: Secondary | ICD-10-CM

## 2020-03-29 NOTE — Assessment & Plan Note (Signed)
Much improvement on fluoxetine 20 mg daily and s/p hysterectomy (went well and recovering)  Encouraged self care and exercise when able  Sleep is better with hydroxyzine Counseling -continue if helpful  Will update in 3-6 mo  If stress continues to improve would like to wean off ssri

## 2020-03-29 NOTE — Patient Instructions (Addendum)
So glad you are doing better  Take care of yourself  Continue current medicines   Get your covid booster shot when you can   Continue counseling if helpful

## 2020-03-29 NOTE — Progress Notes (Signed)
Subjective:    Patient ID: Kari Rice, female    DOB: February 26, 1976, 44 y.o.   MRN: [id number]  This visit occurred during the SARS-CoV-2 public health emergency.  Safety protocols were in place, including screening questions prior to the visit, additional usage of staff PPE, and extensive cleaning of exam room while observing appropriate contact time as indicated for disinfecting solutions.    HPI Pt presents for f/u of dysthymia   Wt Readings from Last 3 Encounters:  03/29/20 227 lb 2 oz (103 kg)  03/20/20 227 lb (103 kg)  03/13/20 237 lb 7 oz (107.7 kg)   38.99 kg/m  Last visit discussed mood symptoms in setting of stressors and impending hysterectomy  Px fluoxetine to start 10 mg and titrate up to 50  Was in counseling   For sleep-trial of hydroxyzine  That does help   Had a hysterectomy and it went well  Doing better- feels much better after that  One incision still has glue   Still has ovaries   Doing better emotionally  Unsure if from the surgery or the medicine  No side effects at all   She is pushing herself to socialize more  Some holiday plans  Still has therapy visits-helpful    Patient Active Problem List   Diagnosis Date Noted  . Dysthymia 12/26/2019  . Fibroid uterus 12/26/2019  . Right leg swelling 12/11/2019  . Fungal nail infection 10/11/2019  . Contraception management 10/11/2019  . Great toe pain, right 12/05/2018  . Left anterior knee pain 04/26/2018  . Injury of left toe 01/25/2018  . Left hand paresthesia 11/22/2017  . GERD (gastroesophageal reflux disease) 11/22/2017  . H/O vaginitis 08/10/2017  . Bell's palsy 07/07/2017  . Headache 07/07/2017  . Hair loss 03/25/2016  . Vitamin D deficiency 01/15/2016  . Routine general medical examination at a health care facility 05/29/2014  . Encounter for routine gynecological examination 05/29/2014  . Screening for HIV (human immunodeficiency virus) 05/29/2014  . Encounter for screening  mammogram for breast cancer 05/29/2014  . Joint pain 04/10/2014  . History of DVT (deep vein thrombosis) 07/06/2012  . Varicose veins of lower extremities with other complications 53/66/4403  . Venous (peripheral) insufficiency 12/23/2011  . Phlebitis and thrombophlebitis of the leg 11/06/2011  . Venous insufficiency, peripheral 10/26/2011  . Pedal edema 10/26/2011  . IRREGULAR MENSES 02/29/2008  . 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 Arthritis    right hand  . DVT (deep venous thrombosis) ([place]) 2013  . GERD (gastroesophageal reflux disease)   . PONV (postoperative nausea and vomiting)    after cholecystectomy  . Sleep apnea    CPAP  . Varicose veins    Past Surgical History:  Procedure Laterality Date  . CHOLECYSTECTOMY  Feb. 2006   Gall Bladder  . HAND SURGERY  2016  . NASAL SEPTOPLASTY W/ TURBINOPLASTY Bilateral 10/01/2017   Procedure: NASAL SEPTOPLASTY WITH SUBMUCOCELE RESECTION OF TURBINATE;  Surgeon: Beverly Gust, MD;  Location: [facility];  Service: ENT;  Laterality: Bilateral;  Sleep apnea  . ROBOTIC ASSISTED LAPAROSCOPIC HYSTERECTOMY AND SALPINGECTOMY Bilateral 03/13/2020   Procedure: XI ROBOTIC ASSISTED LAPAROSCOPIC HYSTERECTOMY AND SALPINGECTOMY;  Surgeon: Homero Fellers, MD;  Location: [facility];  Service: Gynecology;  Laterality: Bilateral;  . THROMBECTOMY Right 01/13/12 and  01/21/12   Right iliac vein stent and Vein thrombosis   Social History   Tobacco Use  . Smoking status: Never Smoker  .  Smokeless tobacco: Never Used  Vaping Use  . Vaping Use: Never used  Substance Use Topics  . Alcohol use: No    Alcohol/week: 0.0 standard drinks  . Drug use: No   Family History  Problem Relation Age of Onset  . Arthritis Mother   . Cancer Mother        uterine   . Heart disease Father        Heart Disease before age 64  . Cancer Father        skin   . Hypertension Father   . Heart attack Father     Allergies  Allergen Reactions  . Penicillins Rash  . Sulfa Antibiotics Rash   Current Outpatient Medications on File Prior to Visit  Medication Sig Dispense Refill  . acetaminophen (TYLENOL) 650 MG CR tablet Take 1,300 mg by mouth every 8 (eight) hours as needed for pain.    . Cholecalciferol (VITAMIN D3) 2000 units TABS Take 1 tablet by mouth daily.    Marland Kitchen FLUoxetine (PROZAC) 20 MG capsule Take 1 capsule (20 mg total) by mouth daily. 90 capsule 3  . hydrOXYzine (ATARAX/VISTARIL) 10 MG tablet TAKE 1 TABLET (10 MG TOTAL) BY MOUTH AT BEDTIME AS NEEDED (INSOMNIA). 90 tablet 1  . NON FORMULARY Support hose to the waist for dx of venous insufficiency and edema 15-20 mm Hg    . omeprazole (PRILOSEC) 20 MG capsule Take 20 mg by mouth daily.     No current facility-administered medications on file prior to visit.     Review of Systems  Constitutional: Negative for activity change, appetite change, fatigue, fever and unexpected weight change.  HENT: Negative for congestion, ear pain, rhinorrhea, sinus pressure and sore throat.   Eyes: Negative for pain, redness and visual disturbance.  Respiratory: Negative for cough, shortness of breath and wheezing.   Cardiovascular: Negative for chest pain and palpitations.  Gastrointestinal: Negative for abdominal pain, blood in stool, constipation and diarrhea.  Endocrine: Negative for polydipsia and polyuria.  Genitourinary: Negative for dysuria, frequency and urgency.  Musculoskeletal: Negative for arthralgias, back pain and myalgias.  Skin: Negative for pallor and rash.  Allergic/Immunologic: Negative for environmental allergies.  Neurological: Negative for dizziness, syncope and headaches.  Hematological: Negative for adenopathy. Does not bruise/bleed easily.  Psychiatric/Behavioral: Negative for decreased concentration and dysphoric mood. The patient is not nervous/anxious.        Objective:   Physical Exam Constitutional:      General: She  is not in acute distress.    Appearance: Normal appearance. She is obese. She is not ill-appearing or diaphoretic.  Eyes:     General: No scleral icterus.    Conjunctiva/sclera: Conjunctivae normal.     Pupils: Pupils are equal, round, and reactive to light.  Cardiovascular:     Rate and Rhythm: Normal rate and regular rhythm.     Heart sounds: Normal heart sounds.  Pulmonary:     Effort: Pulmonary effort is normal. No respiratory distress.     Breath sounds: Normal breath sounds. No wheezing.  Musculoskeletal:     Cervical back: Normal range of motion.  Lymphadenopathy:     Cervical: No cervical adenopathy.  Skin:    General: Skin is warm and dry.     Findings: No erythema or rash.     Comments: Healing laparoscopy scars on abdomen  No signs of infection Some glue still present  Neurological:     Mental Status: She is alert.     Sensory:  No sensory deficit.     Coordination: Coordination normal.     Deep Tendon Reflexes: Reflexes normal.  Psychiatric:        Mood and Affect: Mood normal.     Comments: Mood is good today cheerful           Assessment & Plan:   Problem List Items Addressed This Visit      Other   Dysthymia - Primary    Much improvement on fluoxetine 20 mg daily and s/p hysterectomy (went well and recovering)  Encouraged self care and exercise when able  Sleep is better with hydroxyzine Counseling -continue if helpful  Will update in 3-6 mo  If stress continues to improve would like to wean off ssri

## 2020-04-01 ENCOUNTER — Ambulatory Visit (INDEPENDENT_AMBULATORY_CARE_PROVIDER_SITE_OTHER): Payer: 59 | Admitting: Psychology

## 2020-04-01 DIAGNOSIS — F321 Major depressive disorder, single episode, moderate: Secondary | ICD-10-CM | POA: Diagnosis not present

## 2020-04-15 ENCOUNTER — Ambulatory Visit: Payer: 59 | Admitting: Psychology

## 2020-04-22 ENCOUNTER — Ambulatory Visit (INDEPENDENT_AMBULATORY_CARE_PROVIDER_SITE_OTHER): Payer: 59 | Admitting: Psychology

## 2020-04-22 DIAGNOSIS — F321 Major depressive disorder, single episode, moderate: Secondary | ICD-10-CM

## 2020-04-23 ENCOUNTER — Other Ambulatory Visit: Payer: Self-pay

## 2020-04-23 ENCOUNTER — Encounter: Payer: Self-pay | Admitting: Obstetrics and Gynecology

## 2020-04-23 ENCOUNTER — Ambulatory Visit (INDEPENDENT_AMBULATORY_CARE_PROVIDER_SITE_OTHER): Payer: Managed Care, Other (non HMO) | Admitting: Obstetrics and Gynecology

## 2020-04-23 VITALS — BP 110/70 | Ht 64.0 in | Wt 231.0 lb

## 2020-04-23 DIAGNOSIS — Z90710 Acquired absence of both cervix and uterus: Secondary | ICD-10-CM

## 2020-04-23 NOTE — Progress Notes (Signed)
  Postoperative Follow-up Patient presents post op from robotic total hysterectomy, bilateral salpingectomy for fibroids, 6 weeks ago.  Subjective: Patient reports marked improvement in her preop symptoms. Eating a regular diet without difficulty. Pain is controlled without any medications.  Activity: sedentary. Patient reports additional symptom's since surgery of None.  Objective: BP 110/70   Ht 5\' 4"  (1.626 m)   Wt 231 lb (104.8 kg)   LMP  (LMP Unknown)   BMI 39.65 kg/m  Physical Exam Constitutional:      Appearance: She is well-developed.  Genitourinary:     Vagina and uterus normal.     There is no lesion on the right labia.     There is no lesion on the left labia.    No lesions in the vagina.      Right Adnexa: no mass present.    Left Adnexa: no mass present.    No cervical motion tenderness.  Breasts:     Right: No inverted nipple, mass, nipple discharge or skin change.     Left: No inverted nipple, mass, nipple discharge or skin change.    HENT:     Head: Normocephalic and atraumatic.  Eyes:     Extraocular Movements: EOM normal.  Neck:     Thyroid: No thyromegaly.  Cardiovascular:     Rate and Rhythm: Normal rate and regular rhythm.     Heart sounds: Normal heart sounds.  Pulmonary:     Effort: Pulmonary effort is normal.     Breath sounds: Normal breath sounds.  Abdominal:     General: Abdomen is flat. Bowel sounds are normal. There is no distension.     Palpations: Abdomen is soft. There is no mass.     Comments: Incision are well healed and intact.  Musculoskeletal:     Cervical back: Neck supple.  Neurological:     Mental Status: She is alert and oriented to person, place, and time.  Skin:    General: Skin is warm and dry.  Psychiatric:        Mood and Affect: Mood and affect normal.        Behavior: Behavior normal.        Thought Content: Thought content normal.        Judgment: Judgment normal.  Vitals reviewed.     Assessment: s/p :   robotic total hysterectomy, bilateral salpingectomy stable  Plan: Patient has done well after surgery with no apparent complications.  I have discussed the post-operative course to date, and the expected progress moving forward.  The patient understands what complications to be concerned about.  I will see the patient in routine follow up, or sooner if needed.    Activity plan: No heavy lifting. Pelvic rest.  Given note for return to work and reduced hours initially. Patient's job requires significant ambulation and lifting.   Dayton Sherr R Tieisha Darden 04/23/2020, 9:09 AM

## 2020-04-23 NOTE — Progress Notes (Signed)
Pt is here for a follow up and a return to work note. Pt states she's still having some cramping.

## 2020-04-29 ENCOUNTER — Ambulatory Visit: Payer: 59 | Admitting: Psychology

## 2020-05-06 ENCOUNTER — Ambulatory Visit (INDEPENDENT_AMBULATORY_CARE_PROVIDER_SITE_OTHER): Payer: 59 | Admitting: Psychology

## 2020-05-06 DIAGNOSIS — F321 Major depressive disorder, single episode, moderate: Secondary | ICD-10-CM

## 2020-05-13 ENCOUNTER — Ambulatory Visit: Payer: 59 | Admitting: Psychology

## 2020-05-17 ENCOUNTER — Ambulatory Visit (INDEPENDENT_AMBULATORY_CARE_PROVIDER_SITE_OTHER): Payer: 59 | Admitting: Psychology

## 2020-05-17 DIAGNOSIS — F321 Major depressive disorder, single episode, moderate: Secondary | ICD-10-CM | POA: Diagnosis not present

## 2020-05-20 ENCOUNTER — Ambulatory Visit: Payer: 59 | Admitting: Psychology

## 2020-05-21 ENCOUNTER — Other Ambulatory Visit: Payer: Self-pay | Admitting: Family Medicine

## 2020-05-21 ENCOUNTER — Encounter: Payer: Self-pay | Admitting: Family Medicine

## 2020-05-21 ENCOUNTER — Telehealth (INDEPENDENT_AMBULATORY_CARE_PROVIDER_SITE_OTHER): Payer: Managed Care, Other (non HMO) | Admitting: Family Medicine

## 2020-05-21 DIAGNOSIS — J069 Acute upper respiratory infection, unspecified: Secondary | ICD-10-CM | POA: Diagnosis not present

## 2020-05-21 NOTE — Assessment & Plan Note (Signed)
Discussed isolation and symptom care  covid swab ordered -pending result Enc fluids and rest  Continue mucinex and tylenol prn  Add nasal saline irrigation  Watch for s/s of bacterial sinus infection  ER precautions discussed

## 2020-05-21 NOTE — Progress Notes (Signed)
Virtual Visit via Video Note  I connected with [name] on 05/21/20 at  3:30 PM EST by a video enabled telemedicine application and verified that I am speaking with the correct person using two identifiers.  Location: Patient: home Provider: office   I discussed the limitations of evaluation and management by telemedicine and the availability of in person appointments. The patient expressed understanding and agreed to proceed.  Parties involved in encounter  Patient: Kari Rice   Provider:  Loura Pardon MD   History of Present Illness: Pt presents for nasal congestion   Started on Monday am - bad headache  Then pressure in sinuses/face  Post nasal drainage Some diarrhea yesterday and today   Chills yesterday but temp normal  This am- some body aches   No ST , just drainage  Cough- just a little and not productive  No wheezing No sob   Sinus pressure under eyes  Nasal drainage- not much color in mucous   Sense of taste and smell   No nausea    Sick contacts-none known  A lot going around at work    Otc: mucinex sinus max  Tylenol  Lots of fluids   Had nasal surgery in the past  Has not had a saline flush recently    covid immunized with booster Also flu immunized   Patient Active Problem List   Diagnosis Date Noted  . Viral URI with cough 05/21/2020  . Dysthymia 12/26/2019  . Fibroid uterus 12/26/2019  . Right leg swelling 12/11/2019  . Fungal nail infection 10/11/2019  . Contraception management 10/11/2019  . Great toe pain, right 12/05/2018  . Left anterior knee pain 04/26/2018  . Injury of left toe 01/25/2018  . Left hand paresthesia 11/22/2017  . GERD (gastroesophageal reflux disease) 11/22/2017  . H/O vaginitis 08/10/2017  . Bell's palsy 07/07/2017  . Headache 07/07/2017  . Hair loss 03/25/2016  . Vitamin D deficiency 01/15/2016  . Routine general medical examination at a health care facility 05/29/2014  . Encounter for routine  gynecological examination 05/29/2014  . Screening for HIV (human immunodeficiency virus) 05/29/2014  . Encounter for screening mammogram for breast cancer 05/29/2014  . Joint pain 04/10/2014  . History of DVT (deep vein thrombosis) 07/06/2012  . Varicose veins of lower extremities with other complications 18/56/3149  . Venous (peripheral) insufficiency 12/23/2011  . Phlebitis and thrombophlebitis of the leg 11/06/2011  . Venous insufficiency, peripheral 10/26/2011  . Pedal edema 10/26/2011  . IRREGULAR MENSES 02/29/2008  . 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 Arthritis    right hand  . DVT (deep venous thrombosis) ([place]) 2013  . GERD (gastroesophageal reflux disease)   . PONV (postoperative nausea and vomiting)    after cholecystectomy  . Sleep apnea    CPAP  . Varicose veins    Past Surgical History:  Procedure Laterality Date  . CHOLECYSTECTOMY  Feb. 2006   Gall Bladder  . HAND SURGERY  2016  . NASAL SEPTOPLASTY W/ TURBINOPLASTY Bilateral 10/01/2017   Procedure: NASAL SEPTOPLASTY WITH SUBMUCOCELE RESECTION OF TURBINATE;  Surgeon: Beverly Gust, MD;  Location: [place];  Service: ENT;  Laterality: Bilateral;  Sleep apnea  . ROBOTIC ASSISTED LAPAROSCOPIC HYSTERECTOMY AND SALPINGECTOMY Bilateral 03/13/2020   Procedure: XI ROBOTIC ASSISTED LAPAROSCOPIC HYSTERECTOMY AND SALPINGECTOMY;  Surgeon: Homero Fellers, MD;  Location: [facility];  Service: Gynecology;  Laterality: Bilateral;  . THROMBECTOMY Right 01/13/12 and  01/21/12   Right  iliac vein stent and Vein thrombosis   Social History   Tobacco Use  . Smoking status: Never Smoker  . Smokeless tobacco: Never Used  Vaping Use  . Vaping Use: Never used  Substance Use Topics  . Alcohol use: No    Alcohol/week: 0.0 standard drinks  . Drug use: No   Family History  Problem Relation Age of Onset  . Arthritis Mother   . Cancer Mother        uterine   . Heart disease  Father        Heart Disease before age 11  . Cancer Father        skin   . Hypertension Father   . Heart attack Father    Allergies  Allergen Reactions  . Penicillins Rash  . Sulfa Antibiotics Rash   Current Outpatient Medications on File Prior to Visit  Medication Sig Dispense Refill  . acetaminophen (TYLENOL) 650 MG CR tablet Take 1,300 mg by mouth every 8 (eight) hours as needed for pain.    . Cholecalciferol (VITAMIN D3) 2000 units TABS Take 1 tablet by mouth daily.    Marland Kitchen FLUoxetine (PROZAC) 20 MG capsule Take 1 capsule (20 mg total) by mouth daily. 90 capsule 3  . NON FORMULARY Support hose to the waist for dx of venous insufficiency and edema 15-20 mm Hg    . omeprazole (PRILOSEC) 20 MG capsule Take 20 mg by mouth daily.     No current facility-administered medications on file prior to visit.   Review of Systems  Constitutional: Positive for chills. Negative for fever and malaise/fatigue.  HENT: Positive for congestion. Negative for ear pain, sinus pain and sore throat.   Eyes: Negative for blurred vision, discharge and redness.  Respiratory: Positive for cough. Negative for sputum production, shortness of breath and stridor.   Cardiovascular: Negative for chest pain, palpitations and leg swelling.  Gastrointestinal: Negative for abdominal pain, diarrhea, nausea and vomiting.  Musculoskeletal: Negative for myalgias.  Skin: Negative for rash.  Neurological: Positive for headaches. Negative for dizziness.    Observations/Objective: Patient appears well, in no distress Weight is baseline  No facial swelling or asymmetry Voice is mildly hoarse and congested sounding  No obvious tremor or mobility impairment Moving neck and UEs normally Able to hear the call well  No cough or shortness of breath during interview  Talkative and mentally sharp with no cognitive changes No skin changes on face or neck , no rash or pallor Affect is normal    Assessment and Plan: Problem  List Items Addressed This Visit      Respiratory   Viral URI with cough    Discussed isolation and symptom care  covid swab ordered -pending result Enc fluids and rest  Continue mucinex and tylenol prn  Add nasal saline irrigation  Watch for s/s of bacterial sinus infection  ER precautions discussed            Follow Up Instructions: The office will call you to schedule covid swabbing  If symptoms become severe please update [place] and go to the ER   Try nasal saline irrigation for congestion  Continue mucinex and tylenol as needed   Update if not starting to improve in a week or if worsening   Continue to isolate until we get a test result    I discussed the assessment and treatment plan with the patient. The patient was provided an opportunity to ask questions and all were answered. The patient  agreed with the plan and demonstrated an understanding of the instructions.   The patient was advised to call back or seek an in-person evaluation if the symptoms worsen or if the condition fails to improve as anticipated.     Loura Pardon, MD

## 2020-05-21 NOTE — Patient Instructions (Signed)
The office will call you to schedule covid swabbing  If symptoms become severe please update [place] and go to the ER   Try nasal saline irrigation for congestion  Continue mucinex and tylenol as needed   Update if not starting to improve in a week or if worsening   Continue to isolate until we get a test result

## 2020-05-22 ENCOUNTER — Other Ambulatory Visit (INDEPENDENT_AMBULATORY_CARE_PROVIDER_SITE_OTHER): Payer: Managed Care, Other (non HMO)

## 2020-05-22 DIAGNOSIS — J069 Acute upper respiratory infection, unspecified: Secondary | ICD-10-CM | POA: Diagnosis not present

## 2020-05-23 LAB — NOVEL CORONAVIRUS, NAA: SARS-CoV-2, NAA: NOT DETECTED

## 2020-05-23 LAB — SARS-COV-2, NAA 2 DAY TAT

## 2020-05-27 ENCOUNTER — Ambulatory Visit: Payer: 59 | Admitting: Psychology

## 2020-05-31 ENCOUNTER — Ambulatory Visit (INDEPENDENT_AMBULATORY_CARE_PROVIDER_SITE_OTHER): Payer: 59 | Admitting: Psychology

## 2020-05-31 DIAGNOSIS — F321 Major depressive disorder, single episode, moderate: Secondary | ICD-10-CM | POA: Diagnosis not present

## 2020-06-04 ENCOUNTER — Encounter: Payer: Self-pay | Admitting: Obstetrics and Gynecology

## 2020-06-04 ENCOUNTER — Other Ambulatory Visit: Payer: Self-pay

## 2020-06-04 ENCOUNTER — Ambulatory Visit (INDEPENDENT_AMBULATORY_CARE_PROVIDER_SITE_OTHER): Payer: Managed Care, Other (non HMO) | Admitting: Obstetrics and Gynecology

## 2020-06-04 VITALS — BP 116/70 | Ht 64.0 in | Wt 232.0 lb

## 2020-06-04 DIAGNOSIS — Z90710 Acquired absence of both cervix and uterus: Secondary | ICD-10-CM

## 2020-06-04 NOTE — Progress Notes (Signed)
  Postoperative Follow-up Patient presents post op from Robotic assisted total hysterectomy and bilateral salpingectomy with contained manual morcellation within a laparoscopic bag for enlarged fibroid uterus , 1 week ago.  Subjective: Patient reports some improvement in her preop symptoms. Eating a regular diet without difficulty. Pain is controlled without any medications.  Activity: normal activities of daily living. Patient reports additional symptom's since surgery of pelvic pain 1-2 times a month, crampy similar to PMS feelings before.  Objective: BP 116/70   Ht 5\' 4"  (1.626 m)   Wt 232 lb (105.2 kg)   LMP  (LMP Unknown)   BMI 39.82 kg/m  Physical Exam Constitutional:      Appearance: She is well-developed.  Genitourinary:     Genitourinary Comments: Vaginal incision is intact, no bleeding, well healed  HENT:     Head: Normocephalic and atraumatic.  Neck:     Thyroid: No thyromegaly.  Cardiovascular:     Rate and Rhythm: Normal rate and regular rhythm.     Heart sounds: Normal heart sounds.  Pulmonary:     Effort: Pulmonary effort is normal.     Breath sounds: Normal breath sounds.  Abdominal:     General: Bowel sounds are normal. There is no distension.     Palpations: Abdomen is soft. There is no mass.  Musculoskeletal:     Cervical back: Neck supple.  Neurological:     Mental Status: She is alert and oriented to person, place, and time.  Skin:    General: Skin is warm and dry.  Psychiatric:        Behavior: Behavior normal.        Thought Content: Thought content normal.        Judgment: Judgment normal.  Vitals reviewed.     Assessment: s/p :  Robotic assisted total hysterectomy and bilateral salpingectomy with contained manual morcellation within a laparoscopic bag stable  Plan: Patient has done well after surgery with no apparent complications.  I have discussed the post-operative course to date, and the expected progress moving forward.  The patient  understands what complications to be concerned about.  I will see the patient in routine follow up, or sooner if needed.    Activity plan: No restriction.  Adrian Prows MD, [place], [facility] 06/04/2020 8:50 AM

## 2020-06-07 ENCOUNTER — Other Ambulatory Visit: Payer: Self-pay | Admitting: Family Medicine

## 2020-06-07 NOTE — Telephone Encounter (Signed)
Pharmacy requests refill on: Fluoxetine 20 mg   LAST REFILL: 03/12/2020 (Q-90, R-3) LAST OV: 03/29/2020 NEXT OV: Not Scheduled  PHARMACY: [place], [place]

## 2020-06-10 ENCOUNTER — Ambulatory Visit: Payer: 59 | Admitting: Psychology

## 2020-06-14 ENCOUNTER — Ambulatory Visit (INDEPENDENT_AMBULATORY_CARE_PROVIDER_SITE_OTHER): Payer: 59 | Admitting: Psychology

## 2020-06-14 DIAGNOSIS — F419 Anxiety disorder, unspecified: Secondary | ICD-10-CM

## 2020-06-24 ENCOUNTER — Ambulatory Visit: Payer: 59 | Admitting: Psychology

## 2020-06-28 ENCOUNTER — Ambulatory Visit: Payer: 59 | Admitting: Psychology

## 2020-07-08 ENCOUNTER — Ambulatory Visit: Payer: 59 | Admitting: Psychology

## 2020-07-19 ENCOUNTER — Encounter: Payer: Self-pay | Admitting: Family Medicine

## 2020-07-19 ENCOUNTER — Ambulatory Visit (INDEPENDENT_AMBULATORY_CARE_PROVIDER_SITE_OTHER): Payer: 59 | Admitting: Psychology

## 2020-07-19 DIAGNOSIS — F321 Major depressive disorder, single episode, moderate: Secondary | ICD-10-CM

## 2020-08-23 ENCOUNTER — Telehealth: Payer: Self-pay

## 2020-08-23 ENCOUNTER — Ambulatory Visit
Admission: RE | Admit: 2020-08-23 | Discharge: 2020-08-23 | Disposition: A | Discharge status: Home / Self Care | Payer: Managed Care, Other (non HMO) | Source: Ambulatory Visit | Attending: Physician Assistant | Admitting: Physician Assistant

## 2020-08-23 ENCOUNTER — Other Ambulatory Visit: Payer: Self-pay

## 2020-08-23 VITALS — BP 132/86 | HR 72 | Temp 98.2°F | Resp 18

## 2020-08-23 DIAGNOSIS — R42 Dizziness and giddiness: Secondary | ICD-10-CM

## 2020-08-23 DIAGNOSIS — Z9049 Acquired absence of other specified parts of digestive tract: Secondary | ICD-10-CM | POA: Diagnosis not present

## 2020-08-23 DIAGNOSIS — Z882 Allergy status to sulfonamides status: Secondary | ICD-10-CM | POA: Insufficient documentation

## 2020-08-23 DIAGNOSIS — Z880 Allergy status to penicillin: Secondary | ICD-10-CM | POA: Insufficient documentation

## 2020-08-23 DIAGNOSIS — R5383 Other fatigue: Secondary | ICD-10-CM | POA: Diagnosis not present

## 2020-08-23 DIAGNOSIS — Z9079 Acquired absence of other genital organ(s): Secondary | ICD-10-CM | POA: Diagnosis not present

## 2020-08-23 DIAGNOSIS — R519 Headache, unspecified: Secondary | ICD-10-CM | POA: Insufficient documentation

## 2020-08-23 DIAGNOSIS — Z20822 Contact with and (suspected) exposure to covid-19: Secondary | ICD-10-CM | POA: Insufficient documentation

## 2020-08-23 LAB — COMPREHENSIVE METABOLIC PANEL
ALT: 16 U/L (ref 0–44)
AST: 16 U/L (ref 15–41)
Albumin: 3.9 g/dL (ref 3.5–5.0)
Alkaline Phosphatase: 79 U/L (ref 38–126)
Anion gap: 5 (ref 5–15)
BUN: 14 mg/dL (ref 6–20)
CO2: 27 mmol/L (ref 22–32)
Calcium: 9.3 mg/dL (ref 8.9–10.3)
Chloride: 108 mmol/L (ref 98–111)
Creatinine, Ser: 0.65 mg/dL (ref 0.44–1.00)
GFR, Estimated: >60 mL/min (ref >60)
Glucose, Bld: 101 mg/dL — ABNORMAL HIGH (ref 70–99)
Potassium: 4.3 mmol/L (ref 3.5–5.1)
Sodium: 140 mmol/L (ref 135–145)
Total Bilirubin: 0.5 mg/dL (ref 0.3–1.2)
Total Protein: 7.5 g/dL (ref 6.5–8.1)

## 2020-08-23 LAB — CBC WITH DIFFERENTIAL/PLATELET
Abs Immature Granulocytes: 0.03 10*3/uL (ref 0.00–0.07)
Basophils Absolute: 0.1 10*3/uL (ref 0.0–0.1)
Basophils Relative: 1 %
Eosinophils Absolute: 0.3 10*3/uL (ref 0.0–0.5)
Eosinophils Relative: 3 %
HCT: 42.7 % (ref 36.0–46.0)
Hemoglobin: 14 g/dL (ref 12.0–15.0)
Immature Granulocytes: 0 %
Lymphocytes Relative: 34 %
Lymphs Abs: 3.4 10*3/uL (ref 0.7–4.0)
MCH: 28.7 pg (ref 26.0–34.0)
MCHC: 32.8 g/dL (ref 30.0–36.0)
MCV: 87.5 fL (ref 80.0–100.0)
Monocytes Absolute: 0.6 10*3/uL (ref 0.1–1.0)
Monocytes Relative: 6 %
Neutro Abs: 5.6 10*3/uL (ref 1.7–7.7)
Neutrophils Relative %: 56 %
Platelets: 261 10*3/uL (ref 150–400)
RBC: 4.88 MIL/uL (ref 3.87–5.11)
RDW: 12.7 % (ref 11.5–15.5)
WBC: 10 10*3/uL (ref 4.0–10.5)
nRBC: 0 % (ref 0.0–0.2)

## 2020-08-23 MED ORDER — MECLIZINE HCL 25 MG PO TABS: 25.0000 mg | ORAL_TABLET | Freq: Three times a day (TID) | ORAL | 0 refills | Status: AC | PRN

## 2020-08-23 NOTE — ED Provider Notes (Addendum)
[facility]    CSN: [id number] Arrival date & time: 08/23/20  1546      History   Chief Complaint Chief Complaint  Patient presents with  . Dizziness  . Fatigue    HPI Kari Rice is a 45 y.o. female presenting with complaints of feeling dizzy/lightheaded, fatigued and having mild headaches x2 days.  Denies any fevers or body aches.  Denies cough, congestion, sore throat, sinus pain, ear pain, chest pain, palpitations, shortness of breath, abdominal pain, nausea/vomiting or diarrhea.  Patient says the dizzy feeling is more of a lightheaded feeling.  She denies the room spinning.  Does not have any increased dizziness with position changes or head movement.  No sick contacts and no known exposure to COVID-19.  Patient says her headache is about a 5 out of 10.  She says it is not the worst when she is ever had.  No vision changes, numbness/weakness or tingling or confusion. Has history of tinnitus. Patient has no other complaints today.  HPI  Past Medical History:  Diagnosis Date  . Arthritis    right hand  . DVT (deep venous thrombosis) ([place]) 2013  . GERD (gastroesophageal reflux disease)   . PONV (postoperative nausea and vomiting)    after cholecystectomy  . Sleep apnea    CPAP  . Varicose veins     Patient Active Problem List   Diagnosis Date Noted  . Viral URI with cough 05/21/2020  . Dysthymia 12/26/2019  . Fibroid uterus 12/26/2019  . Right leg swelling 12/11/2019  . Fungal nail infection 10/11/2019  . Contraception management 10/11/2019  . Great toe pain, right 12/05/2018  . Left anterior knee pain 04/26/2018  . Injury of left toe 01/25/2018  . Left hand paresthesia 11/22/2017  . GERD (gastroesophageal reflux disease) 11/22/2017  . H/O vaginitis 08/10/2017  . Bell's palsy 07/07/2017  . Headache 07/07/2017  . Hair loss 03/25/2016  . Vitamin D deficiency 01/15/2016  . Routine general medical examination at a health care facility 05/29/2014  .  Encounter for routine gynecological examination 05/29/2014  . Screening for HIV (human immunodeficiency virus) 05/29/2014  . Encounter for screening mammogram for breast cancer 05/29/2014  . Joint pain 04/10/2014  . History of DVT (deep vein thrombosis) 07/06/2012  . Varicose veins of lower extremities with other complications 95/28/4132  . Venous (peripheral) insufficiency 12/23/2011  . Phlebitis and thrombophlebitis of the leg 11/06/2011  . Venous insufficiency, peripheral 10/26/2011  . Pedal edema 10/26/2011  . IRREGULAR MENSES 02/29/2008  . 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 CHOLECYSTECTOMY  Feb. 2006   Gall Bladder  . HAND SURGERY  2016  . NASAL SEPTOPLASTY W/ TURBINOPLASTY Bilateral 10/01/2017   Procedure: NASAL SEPTOPLASTY WITH SUBMUCOCELE RESECTION OF TURBINATE;  Surgeon: Beverly Gust, MD;  Location: [place];  Service: ENT;  Laterality: Bilateral;  Sleep apnea  . ROBOTIC ASSISTED LAPAROSCOPIC HYSTERECTOMY AND SALPINGECTOMY Bilateral 03/13/2020   Procedure: XI ROBOTIC ASSISTED LAPAROSCOPIC HYSTERECTOMY AND SALPINGECTOMY;  Surgeon: Homero Fellers, MD;  Location: [facility];  Service: Gynecology;  Laterality: Bilateral;  . THROMBECTOMY Right 01/13/12 and  01/21/12   Right iliac vein stent and Vein thrombosis    OB History    Gravida  0   Para  0   Term  0   Preterm  0   AB  0   Living  0     SAB  0   IAB  0   Ectopic  0   Multiple  0   Live Births  0            Home Medications    Prior to Admission medications   Medication Sig Start Date End Date Taking? Authorizing Provider  meclizine (ANTIVERT) 25 MG tablet Take 1 tablet (25 mg total) by mouth 3 (three) times daily as needed for up to 7 days for dizziness. 08/23/20 08/30/20 Yes Danton Clap, PA-C  acetaminophen (TYLENOL) 650 MG CR tablet Take 1,300 mg by mouth every 8 (eight) hours as needed for pain.    [provider]  Cholecalciferol (VITAMIN D3) 2000 units TABS Take 1 tablet by mouth daily.    [provider]  FLUoxetine (PROZAC) 20 MG capsule Take 1 capsule (20 mg total) by mouth daily. 03/12/20   Tower, Wynelle Fanny, MD  NON FORMULARY Support hose to the waist for dx of venous insufficiency and edema 15-20 mm Hg    [provider]  omeprazole (PRILOSEC) 20 MG capsule Take 20 mg by mouth daily.    [provider]    Family History Family History  Problem Relation Age of Onset  . Arthritis Mother   . Cancer Mother        uterine   . Heart disease Father        Heart Disease before age 57  . Cancer Father        skin   . Hypertension Father   . Heart attack Father     Social History Social History   Tobacco Use  . Smoking status: Never Smoker  . Smokeless tobacco: Never Used  Vaping Use  . Vaping Use: Never used  Substance Use Topics  . Alcohol use: No    Alcohol/week: 0.0 standard drinks  . Drug use: No     Allergies   Penicillins and Sulfa antibiotics   Review of Systems Review of Systems  Constitutional: Positive for fatigue. Negative for chills, diaphoresis and fever.  HENT: Negative for congestion, ear pain, rhinorrhea, sinus pressure, sinus pain and sore throat.   Eyes: Negative for photophobia and visual disturbance.  Respiratory: Negative for cough and shortness of breath.   Cardiovascular: Negative for chest pain, palpitations and leg swelling.  Gastrointestinal: Negative for abdominal pain, nausea and vomiting.  Musculoskeletal: Negative for arthralgias and myalgias.  Skin: Negative for rash.  Neurological: Positive for dizziness, light-headedness and headaches. Negative for weakness.  Hematological: Negative for adenopathy.     Physical Exam Triage Vital Signs ED Triage Vitals  Enc Vitals Group     BP 08/23/20 1604 132/86     Pulse Rate 08/23/20 1604 72     Resp 08/23/20 1604 18     Temp 08/23/20 1604 98.2 F (36.8 C)     Temp  Source 08/23/20 1604 Oral     SpO2 08/23/20 1604 100 %     Weight --      Height --      Head Circumference --      Peak Flow --      Pain Score 08/23/20 1602 0     Pain Loc --      Pain Edu? --      Excl. in [place]? --    No data found.  Updated Vital Signs BP 132/86 (BP Location: Right Arm)   Pulse 72   Temp 98.2 F (36.8 C) (Oral)   Resp 18   LMP  (LMP Unknown)  SpO2 100%        Physical Exam Vitals and nursing note reviewed.  Constitutional:      General: She is not in acute distress.    Appearance: Normal appearance. She is obese. She is not ill-appearing or toxic-appearing.  HENT:     Head: Normocephalic and atraumatic.     Right Ear: Tympanic membrane, ear canal and external ear normal.     Left Ear: Tympanic membrane, ear canal and external ear normal.     Nose: Nose normal.     Mouth/Throat:     Mouth: Mucous membranes are moist.     Pharynx: Oropharynx is clear.  Eyes:     General: No scleral icterus.       Right eye: No discharge.        Left eye: No discharge.     Extraocular Movements: Extraocular movements intact.     Conjunctiva/sclera: Conjunctivae normal.     Pupils: Pupils are equal, round, and reactive to light.  Cardiovascular:     Rate and Rhythm: Normal rate and regular rhythm.     Heart sounds: Normal heart sounds.  Pulmonary:     Effort: Pulmonary effort is normal. No respiratory distress.     Breath sounds: Normal breath sounds.  Abdominal:     Palpations: Abdomen is soft.     Tenderness: There is no abdominal tenderness.  Musculoskeletal:     Cervical back: Neck supple.  Skin:    General: Skin is dry.  Neurological:     General: No focal deficit present.     Mental Status: She is alert and oriented to person, place, and time. Mental status is at baseline.     Cranial Nerves: No cranial nerve deficit.     Motor: No weakness.     Gait: Gait normal.  Psychiatric:        Mood and Affect: Mood normal.        Behavior: Behavior normal.         Thought Content: Thought content normal.      UC Treatments / Results  Labs (all labs ordered are listed, but only abnormal results are displayed) Labs Reviewed  COMPREHENSIVE METABOLIC PANEL - Abnormal; Notable for the following components:      Result Value   Glucose, Bld 101 (*)    All other components within normal limits  SARS CORONAVIRUS 2 (TAT 6-24 HRS)  CBC WITH DIFFERENTIAL/PLATELET    EKG   Radiology No results found.  Procedures ED EKG  Date/Time: 08/23/2020 5:22 PM Performed by: Danton Clap, PA-C Authorized by: Danton Clap, PA-C   ECG reviewed by ED Physician in the absence of a cardiologist: yes   Previous ECG:    Previous ECG:  Unavailable Interpretation:    Interpretation: normal   Rate:    ECG rate assessment: normal   Rhythm:    Rhythm: sinus rhythm   Ectopy:    Ectopy: none   QRS:    QRS axis:  Normal   QRS intervals:  Normal   QRS conduction: normal   ST segments:    ST segments:  Normal T waves:    T waves: normal   Comments:     Normal sinus rhythm. Regular rate at 67 bpm   (including critical care time)  Medications Ordered in UC Medications - No data to display  Initial Impression / Assessment and Plan / UC Course  I have reviewed the triage vital signs and the nursing  notes.  Pertinent labs & imaging results that were available during my care of the patient were reviewed by me and considered in my medical decision making (see chart for details).   45 year old female presenting for dizziness/lightheadedness, headaches and fatigue x2 days.  All vital signs are normal and stable and she is in no acute distress.  Her exam is benign today.  EKG performed today shows NSR and RR  CBC and CMP ordered today.  CBC is normal.  CMP is also normal.  COVID test obtained.  Current CDC guidelines, isolation protocol and ED precautions reviewed patient.  Advised patient her clinical presentation is most consistent with likely  an inner ear issue versus related to her headache/migraine.  Suggested meclizine for the dizziness and ibuprofen/Tylenol for the headaches.  Advised increasing rest and fluids.  Patient requests a work note for today and yesterday.  Reviewed ED precautions with patient regarding dizziness and headaches.   Final Clinical Impressions(s) / UC Diagnoses   Final diagnoses:  Dizziness  Acute nonintractable headache, unspecified headache type     Discharge Instructions     DIZZINESS/LIGHTHEADEDNESS/HEADACHE: Your EKG was normal today.  Your labs were also normal.  We have obtained COVID testing.  See more information below.  Your dizziness/lightheadedness is likely due to an inner ear issue.  I have sent meclizine which should help.  Careful with position changes or anything that exacerbates her dizziness. Tylenol and Motrin for headaches. This should improve over the next couple days.  Also, make sure to stay hydrated.  If your dizziness is associated with any fevers, severe headaches, vision changes, chest pain, palpitations, breathing difficulty, lethargy, call 911 or go to ED.  You have received COVID testing today either for positive exposure, concerning symptoms that could be related to COVID infection, screening purposes, or re-testing after confirmed positive.  Your test obtained today checks for active viral infection in the last 1-2 weeks. If your test is negative now, you can still test positive later. So, if you do develop symptoms you should either get re-tested and/or isolate x 5 days and then strict mask use x 5 days (unvaccinated) or mask use x 10 days (vaccinated). Please follow CDC guidelines.  While Rapid antigen tests come back in 15-20 minutes, send out PCR/molecular test results typically come back within 1-3 days. In the mean time, if you are symptomatic, assume this could be a positive test and treat/monitor yourself as if you do have COVID.   We will call with test results if  positive. Please download the MyChart app and set up a profile to access test results.   If symptomatic, go home and rest. Push fluids. Take Tylenol as needed for discomfort. Gargle warm salt water. Throat lozenges. Take Mucinex DM or Robitussin for cough. Humidifier in bedroom to ease coughing. Warm showers. Also review the COVID handout for more information.  COVID-19 INFECTION: The incubation period of COVID-19 is approximately 14 days after exposure, with most symptoms developing in roughly 4-5 days. Symptoms may range in severity from mild to critically severe. Roughly 80% of those infected will have mild symptoms. People of any age may become infected with COVID-19 and have the ability to transmit the virus. The most common symptoms include: fever, fatigue, cough, body aches, headaches, sore throat, nasal congestion, shortness of breath, nausea, vomiting, diarrhea, changes in smell and/or taste.    COURSE OF ILLNESS Some patients may begin with mild disease which can progress quickly into critical symptoms. If your  symptoms are worsening please call ahead to the Emergency Department and proceed there for further treatment. Recovery time appears to be roughly 1-2 weeks for mild symptoms and 3-6 weeks for severe disease.   GO IMMEDIATELY TO ER FOR FEVER YOU ARE UNABLE TO GET DOWN WITH TYLENOL, BREATHING PROBLEMS, CHEST PAIN, FATIGUE, LETHARGY, INABILITY TO EAT OR DRINK, ETC  QUARANTINE AND ISOLATION: To help decrease the spread of COVID-19 please remain isolated if you have COVID infection or are highly suspected to have COVID infection. This means -stay home and isolate to one room in the home if you live with others. Do not share a bed or bathroom with others while ill, sanitize and wipe down all countertops and keep common areas clean and disinfected. Stay home for 5 days. If you have no symptoms or your symptoms are resolving after 5 days, you can leave your house. Continue to wear a mask around  others for 5 additional days. If you have been in close contact (within 6 feet) of someone diagnosed with COVID 19, you are advised to quarantine in your home for 14 days as symptoms can develop anywhere from 2-14 days after exposure to the virus. If you develop symptoms, you  must isolate.  Most current guidelines for COVID after exposure -unvaccinated: isolate 5 days and strict mask use x 5 days. Test on day 5 is possible -vaccinated: wear mask x 10 days if symptoms do not develop -You do not necessarily need to be tested for COVID if you have + exposure and  develop symptoms. Just isolate at home x10 days from symptom onset During this global pandemic, CDC advises to practice social distancing, try to stay at least 81ft away from others at all times. Wear a face covering. Wash and sanitize your hands regularly and avoid going anywhere that is not necessary.  KEEP IN MIND THAT THE COVID TEST IS NOT 100% ACCURATE AND YOU SHOULD STILL DO EVERYTHING TO PREVENT POTENTIAL SPREAD OF VIRUS TO OTHERS (WEAR MASK, WEAR GLOVES, [place] HANDS AND SANITIZE REGULARLY). IF INITIAL TEST IS NEGATIVE, THIS MAY NOT MEAN YOU ARE DEFINITELY NEGATIVE. MOST ACCURATE TESTING IS DONE 5-7 DAYS AFTER EXPOSURE.   It is not advised by CDC to get re-tested after receiving a positive COVID test since you can still test positive for weeks to months after you have already cleared the virus.   *If you have not been vaccinated for COVID, I strongly suggest you consider getting vaccinated as long as there are no contraindications.      ED Prescriptions    Medication Sig Dispense Auth. Provider   meclizine (ANTIVERT) 25 MG tablet Take 1 tablet (25 mg total) by mouth 3 (three) times daily as needed for up to 7 days for dizziness. 15 tablet Gretta Cool     PDMP not reviewed this encounter.   Danton Clap, PA-C 08/23/20 1733    Laurene Footman B, PA-C 08/23/20 1733

## 2020-08-23 NOTE — Telephone Encounter (Signed)
Aware, will watch for correspondence  

## 2020-08-23 NOTE — ED Triage Notes (Signed)
Pt is present today with dizziness and fatigue that started Wednesday. Pt states that when she is dizziness she mostly feels unstable when she is walking and while sitting down just lightheaded.

## 2020-08-23 NOTE — Discharge Instructions (Addendum)
DIZZINESS/LIGHTHEADEDNESS/HEADACHE: Your EKG was normal today.  Your labs were also normal.  We have obtained COVID testing.  See more information below.  Your dizziness/lightheadedness is likely due to an inner ear issue.  I have sent meclizine which should help.  Careful with position changes or anything that exacerbates her dizziness. Tylenol and Motrin for headaches. This should improve over the next couple days.  Also, make sure to stay hydrated.  If your dizziness is associated with any fevers, severe headaches, vision changes, chest pain, palpitations, breathing difficulty, lethargy, call 911 or go to ED.  You have received COVID testing today either for positive exposure, concerning symptoms that could be related to COVID infection, screening purposes, or re-testing after confirmed positive.  Your test obtained today checks for active viral infection in the last 1-2 weeks. If your test is negative now, you can still test positive later. So, if you do develop symptoms you should either get re-tested and/or isolate x 5 days and then strict mask use x 5 days (unvaccinated) or mask use x 10 days (vaccinated). Please follow CDC guidelines.  While Rapid antigen tests come back in 15-20 minutes, send out PCR/molecular test results typically come back within 1-3 days. In the mean time, if you are symptomatic, assume this could be a positive test and treat/monitor yourself as if you do have COVID.   We will call with test results if positive. Please download the MyChart app and set up a profile to access test results.   If symptomatic, go home and rest. Push fluids. Take Tylenol as needed for discomfort. Gargle warm salt water. Throat lozenges. Take Mucinex DM or Robitussin for cough. Humidifier in bedroom to ease coughing. Warm showers. Also review the COVID handout for more information.  COVID-19 INFECTION: The incubation period of COVID-19 is approximately 14 days after exposure, with most symptoms  developing in roughly 4-5 days. Symptoms may range in severity from mild to critically severe. Roughly 80% of those infected will have mild symptoms. People of any age may become infected with COVID-19 and have the ability to transmit the virus. The most common symptoms include: fever, fatigue, cough, body aches, headaches, sore throat, nasal congestion, shortness of breath, nausea, vomiting, diarrhea, changes in smell and/or taste.    COURSE OF ILLNESS Some patients may begin with mild disease which can progress quickly into critical symptoms. If your symptoms are worsening please call ahead to the Emergency Department and proceed there for further treatment. Recovery time appears to be roughly 1-2 weeks for mild symptoms and 3-6 weeks for severe disease.   GO IMMEDIATELY TO ER FOR FEVER YOU ARE UNABLE TO GET DOWN WITH TYLENOL, BREATHING PROBLEMS, CHEST PAIN, FATIGUE, LETHARGY, INABILITY TO EAT OR DRINK, ETC  QUARANTINE AND ISOLATION: To help decrease the spread of COVID-19 please remain isolated if you have COVID infection or are highly suspected to have COVID infection. This means -stay home and isolate to one room in the home if you live with others. Do not share a bed or bathroom with others while ill, sanitize and wipe down all countertops and keep common areas clean and disinfected. Stay home for 5 days. If you have no symptoms or your symptoms are resolving after 5 days, you can leave your house. Continue to wear a mask around others for 5 additional days. If you have been in close contact (within 6 feet) of someone diagnosed with COVID 19, you are advised to quarantine in your home for 14 days as  symptoms can develop anywhere from 2-14 days after exposure to the virus. If you develop symptoms, you  must isolate.  Most current guidelines for COVID after exposure -unvaccinated: isolate 5 days and strict mask use x 5 days. Test on day 5 is possible -vaccinated: wear mask x 10 days if symptoms do not  develop -You do not necessarily need to be tested for COVID if you have + exposure and  develop symptoms. Just isolate at home x10 days from symptom onset During this global pandemic, CDC advises to practice social distancing, try to stay at least 79ft away from others at all times. Wear a face covering. Wash and sanitize your hands regularly and avoid going anywhere that is not necessary.  KEEP IN MIND THAT THE COVID TEST IS NOT 100% ACCURATE AND YOU SHOULD STILL DO EVERYTHING TO PREVENT POTENTIAL SPREAD OF VIRUS TO OTHERS (WEAR MASK, WEAR GLOVES, [place] HANDS AND SANITIZE REGULARLY). IF INITIAL TEST IS NEGATIVE, THIS MAY NOT MEAN YOU ARE DEFINITELY NEGATIVE. MOST ACCURATE TESTING IS DONE 5-7 DAYS AFTER EXPOSURE.   It is not advised by CDC to get re-tested after receiving a positive COVID test since you can still test positive for weeks to months after you have already cleared the virus.   *If you have not been vaccinated for COVID, I strongly suggest you consider getting vaccinated as long as there are no contraindications.

## 2020-08-23 NOTE — Telephone Encounter (Signed)
Pt said starting on 08/21/20 pt has had H/A pain level of 5 now; lightheaded,fatigue, dry cough and head congestion. No SOB,fever or other covid symptoms. Pt does not have way to ck vitals and pt does not want to see anyone else except Dr Glori Bickers which has an appt next wk. Pt will go to [facility] in [place] for eval and any needed testing. Sending note to Dr Glori Bickers.

## 2020-08-24 LAB — SARS CORONAVIRUS 2 (TAT 6-24 HRS): SARS Coronavirus 2: NEGATIVE

## 2020-08-27 ENCOUNTER — Ambulatory Visit: Payer: Managed Care, Other (non HMO) | Admitting: Family Medicine

## 2020-08-28 ENCOUNTER — Ambulatory Visit: Payer: Managed Care, Other (non HMO) | Admitting: Family Medicine

## 2020-08-28 ENCOUNTER — Other Ambulatory Visit: Payer: Self-pay

## 2020-08-28 ENCOUNTER — Encounter: Payer: Self-pay | Admitting: Family Medicine

## 2020-08-28 VITALS — BP 128/82 | HR 75 | Temp 97.0°F | Ht 64.0 in | Wt 232.5 lb

## 2020-08-28 DIAGNOSIS — J0110 Acute frontal sinusitis, unspecified: Secondary | ICD-10-CM

## 2020-08-28 DIAGNOSIS — R42 Dizziness and giddiness: Secondary | ICD-10-CM | POA: Insufficient documentation

## 2020-08-28 DIAGNOSIS — J329 Chronic sinusitis, unspecified: Secondary | ICD-10-CM | POA: Insufficient documentation

## 2020-08-28 MED ORDER — PREDNISONE 10 MG PO TABS: ORAL_TABLET | ORAL | 0 refills | Status: DC

## 2020-08-28 NOTE — Assessment & Plan Note (Signed)
Suspect inflammatory sinusitis (no colored mucous) in the setting of past sinus surgery  Recommend saline irrigation  Meclizine for dizziness Prednisone taper 40 mg sent (disc side eff) Watch for s/s of bacterial infection  Consider nasal steroid when done  Want to control congestion to prevent more ETD

## 2020-08-28 NOTE — Progress Notes (Signed)
Subjective:    Patient ID: Kari Rice, female    DOB: [birth year], 45 y.o.   MRN: [id number]  This visit occurred during the SARS-CoV-2 public health emergency.  Safety protocols were in place, including screening questions prior to the visit, additional usage of staff PPE, and extensive cleaning of exam room while observing appropriate contact time as indicated for disinfecting solutions.    HPI Pt presents with headache/dizziness and fatigue   Wt Readings from Last 3 Encounters:  08/28/20 232 lb 8 oz (105.5 kg)  06/04/20 232 lb (105.2 kg)  04/23/20 231 lb (104.8 kg)   39.91 kg/m  Pt was seen in ER on 5/13 for dizziness and fatigue with mild HA for 13d  A/P  45 year old female presenting for dizziness/lightheadedness, headaches and fatigue x2 days.  All vital signs are normal and stable and she is in no acute distress.  Her exam is benign today.  EKG performed today shows NSR and RR  CBC and CMP ordered today.  CBC is normal.  CMP is also normal.  COVID test obtained.  Current CDC guidelines, isolation protocol and ED precautions reviewed patient.  Advised patient her clinical presentation is most consistent with likely an inner ear issue versus related to her headache/migraine.  Suggested meclizine for the dizziness and ibuprofen/Tylenol for the headaches.  Advised increasing rest and fluids.  Patient requests a work note for today and yesterday.  Reviewed ED precautions with patient regarding dizziness and headaches.   Lab Results  Component Value Date   WBC 10.0 08/23/2020   HGB 14.0 08/23/2020   HCT 42.7 08/23/2020   MCV 87.5 08/23/2020   PLT 261 08/23/2020   Neg covid test   Today -better than what she was  Tired  Still some dizziness and headache -not as bad  She tried meclizine -in evening /is sedating   Light headed/not spinning  No nausea  Head hurts frontal /both sides  (had started in face)  occ back of head  Constant in nature  Waxes and  wanes   Worse with initiation of movement/ getting up   Some nasal congestion  No colored mucous  A little blood in mucous- on and off  H/o sinus surgery  Some forehead tenderness   She tried sinus "max" med-not helpful Zyrtec 10 mg -not helpful  Has not done nasal irrigation  Motrin -helped some   Dr Tami Ribas is her ENT Had surgery in 2019    Patient Active Problem List   Diagnosis Date Noted  . Dizziness 08/28/2020  . Sinusitis 08/28/2020  . Dysthymia 12/26/2019  . Fibroid uterus 12/26/2019  . Right leg swelling 12/11/2019  . Fungal nail infection 10/11/2019  . Contraception management 10/11/2019  . Great toe pain, right 12/05/2018  . Left anterior knee pain 04/26/2018  . Injury of left toe 01/25/2018  . Left hand paresthesia 11/22/2017  . GERD (gastroesophageal reflux disease) 11/22/2017  . H/O vaginitis 08/10/2017  . Bell's palsy 07/07/2017  . Headache 07/07/2017  . Hair loss 03/25/2016  . Vitamin D deficiency 01/15/2016  . Routine general medical examination at a health care facility 05/29/2014  . Encounter for routine gynecological examination 05/29/2014  . Screening for HIV (human immunodeficiency virus) 05/29/2014  . Encounter for screening mammogram for breast cancer 05/29/2014  . Joint pain 04/10/2014  . History of DVT (deep vein thrombosis) 07/06/2012  . Varicose veins of lower extremities with other complications 73/22/0254  . Venous (peripheral) insufficiency 12/23/2011  . Phlebitis and  thrombophlebitis of the leg 11/06/2011  . Venous insufficiency, peripheral 10/26/2011  . Pedal edema 10/26/2011  . IRREGULAR MENSES 02/29/2008  . 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 Arthritis    right hand  . DVT (deep venous thrombosis) ([place]) 2013  . GERD (gastroesophageal reflux disease)   . PONV (postoperative nausea and vomiting)    after cholecystectomy  . Sleep apnea    CPAP  . Varicose veins    Past Surgical  History:  Procedure Laterality Date  . CHOLECYSTECTOMY  Feb. 2006   Gall Bladder  . HAND SURGERY  2016  . NASAL SEPTOPLASTY W/ TURBINOPLASTY Bilateral 10/01/2017   Procedure: NASAL SEPTOPLASTY WITH SUBMUCOCELE RESECTION OF TURBINATE;  Surgeon: Beverly Gust, MD;  Location: [facility];  Service: ENT;  Laterality: Bilateral;  Sleep apnea  . ROBOTIC ASSISTED LAPAROSCOPIC HYSTERECTOMY AND SALPINGECTOMY Bilateral 03/13/2020   Procedure: XI ROBOTIC ASSISTED LAPAROSCOPIC HYSTERECTOMY AND SALPINGECTOMY;  Surgeon: Homero Fellers, MD;  Location: [facility];  Service: Gynecology;  Laterality: Bilateral;  . THROMBECTOMY Right 01/13/12 and  01/21/12   Right iliac vein stent and Vein thrombosis   Social History   Tobacco Use  . Smoking status: Never Smoker  . Smokeless tobacco: Never Used  Vaping Use  . Vaping Use: Never used  Substance Use Topics  . Alcohol use: No    Alcohol/week: 0.0 standard drinks  . Drug use: No   Family History  Problem Relation Age of Onset  . Arthritis Mother   . Cancer Mother        uterine   . Heart disease Father        Heart Disease before age 41  . Cancer Father        skin   . Hypertension Father   . Heart attack Father    Allergies  Allergen Reactions  . Penicillins Rash  . Sulfa Antibiotics Rash   Current Outpatient Medications on File Prior to Visit  Medication Sig Dispense Refill  . acetaminophen (TYLENOL) 650 MG CR tablet Take 1,300 mg by mouth every 8 (eight) hours as needed for pain.    . Cholecalciferol (VITAMIN D3) 2000 units TABS Take 1 tablet by mouth daily.    . meclizine (ANTIVERT) 25 MG tablet Take 1 tablet (25 mg total) by mouth 3 (three) times daily as needed for up to 7 days for dizziness. 15 tablet 0  . NON FORMULARY Support hose to the waist for dx of venous insufficiency and edema 15-20 mm Hg    . omeprazole (PRILOSEC) 20 MG capsule Take 20 mg by mouth daily.     No current facility-administered medications on file  prior to visit.     Review of Systems  Constitutional: Negative for activity change, appetite change, fatigue, fever and unexpected weight change.  HENT: Positive for congestion, hearing loss, postnasal drip, sinus pressure and sinus pain. Negative for ear discharge, ear pain, facial swelling, mouth sores, rhinorrhea, sore throat and voice change.   Eyes: Negative for pain, redness and visual disturbance.  Respiratory: Negative for cough, shortness of breath and wheezing.   Cardiovascular: Negative for chest pain and palpitations.  Gastrointestinal: Negative for abdominal pain, blood in stool, constipation and diarrhea.  Endocrine: Negative for polydipsia and polyuria.  Genitourinary: Negative for dysuria, frequency and urgency.  Musculoskeletal: Negative for arthralgias, back pain and myalgias.  Skin: Negative for pallor and rash.  Allergic/Immunologic: Negative for environmental allergies.  Neurological: Positive for headaches. Negative  for dizziness, syncope, facial asymmetry and light-headedness.  Hematological: Negative for adenopathy. Does not bruise/bleed easily.  Psychiatric/Behavioral: Negative for decreased concentration and dysphoric mood. The patient is not nervous/anxious.        Objective:   Physical Exam Constitutional:      General: She is not in acute distress.    Appearance: Normal appearance. She is well-developed. She is obese. She is not ill-appearing.  HENT:     Head: Normocephalic and atraumatic.     Comments: Nares are injected and congested  Clear pnd   Bilateral frontal sinus tenderness   No temporal tenderness    Right Ear: Tympanic membrane and external ear normal.     Left Ear: Tympanic membrane, ear canal and external ear normal.     Nose: Congestion and rhinorrhea present.     Mouth/Throat:     Mouth: Mucous membranes are moist.     Pharynx: No oropharyngeal exudate or posterior oropharyngeal erythema.     Comments: Clear pnd Eyes:     General:         Right eye: No discharge.        Left eye: No discharge.     Conjunctiva/sclera: Conjunctivae normal.     Pupils: Pupils are equal, round, and reactive to light.     Comments: 2-3 beats of horizontal nystagmus bilateral  Cardiovascular:     Rate and Rhythm: Normal rate and regular rhythm.     Heart sounds: Normal heart sounds.  Pulmonary:     Effort: Pulmonary effort is normal. No respiratory distress.     Breath sounds: Normal breath sounds. No stridor. No wheezing, rhonchi or rales.  Musculoskeletal:     Cervical back: Normal range of motion and neck supple. No tenderness.  Lymphadenopathy:     Cervical: No cervical adenopathy.  Skin:    General: Skin is warm and dry.     Coloration: Skin is not pale.     Findings: No erythema or rash.  Neurological:     Mental Status: She is alert.     Cranial Nerves: No cranial nerve deficit.     Sensory: No sensory deficit.     Motor: No weakness.     Coordination: Coordination normal.     Gait: Gait normal.     Deep Tendon Reflexes: Reflexes normal.  Psychiatric:        Mood and Affect: Mood normal.           Assessment & Plan:   Problem List Items Addressed This Visit      Respiratory   Sinusitis    Suspect inflammatory sinusitis (no colored mucous) in the setting of past sinus surgery  Recommend saline irrigation  Meclizine for dizziness Prednisone taper 40 mg sent (disc side eff) Watch for s/s of bacterial infection  Consider nasal steroid when done  Want to control congestion to prevent more ETD      Relevant Medications   predniSONE (DELTASONE) 10 MG tablet     Other   Dizziness - Primary    Reviewed notes from UC visit for this and headache Some vertiginous component (noted nystagmus on today's exam) and frontal sinus pain  Meclizine prn  Prednisone course for sinusitis Update if not starting to improve in a week or if worsening

## 2020-08-28 NOTE — Assessment & Plan Note (Signed)
Reviewed notes from UC visit for this and headache Some vertiginous component (noted nystagmus on today's exam) and frontal sinus pain  Meclizine prn  Prednisone course for sinusitis Update if not starting to improve in a week or if worsening

## 2020-08-28 NOTE — Patient Instructions (Addendum)
Do saline irrigation  Take prednisone as directed  When done with it -try flonase as directed through allergy season Meclizine as needed for dizziness with caution of sedation   Keep up a good fluid intake   Update if not starting to improve in a week or if worsening

## 2020-09-12 ENCOUNTER — Other Ambulatory Visit: Payer: Self-pay | Admitting: Family Medicine

## 2020-09-12 ENCOUNTER — Encounter: Payer: Self-pay | Admitting: Family Medicine

## 2020-09-12 ENCOUNTER — Ambulatory Visit: Payer: Managed Care, Other (non HMO) | Admitting: Family Medicine

## 2020-09-12 ENCOUNTER — Other Ambulatory Visit: Payer: Self-pay

## 2020-09-12 VITALS — BP 124/78 | HR 71 | Temp 96.9°F | Ht 64.0 in | Wt 237.3 lb

## 2020-09-12 DIAGNOSIS — R42 Dizziness and giddiness: Secondary | ICD-10-CM | POA: Diagnosis not present

## 2020-09-12 DIAGNOSIS — J0110 Acute frontal sinusitis, unspecified: Secondary | ICD-10-CM

## 2020-09-12 MED ORDER — AZITHROMYCIN 250 MG PO TABS: ORAL_TABLET | ORAL | 0 refills | Status: DC

## 2020-09-12 MED ORDER — PREDNISONE 10 MG PO TABS
ORAL_TABLET | ORAL | 0 refills | Status: DC
Start: 2020-09-12 — End: 2021-02-14

## 2020-09-12 NOTE — Patient Instructions (Addendum)
zpak  Prednisone  Nasal irrigation  Keep your ENT appointment   Update if not starting to improve in a week or if worsening    If more dizzy let us know   I will send pt to your pharmacy

## 2020-09-12 NOTE — Progress Notes (Signed)
Subjective:    Patient ID: Kari Rice, female    DOB: [birth year], 45 y.o.   MRN: [id number]  This visit occurred during the SARS-CoV-2 public health emergency.  Safety protocols were in place, including screening questions prior to the visit, additional usage of staff PPE, and extensive cleaning of exam room while observing appropriate contact time as indicated for disinfecting solutions.    HPI Pt presents with c/o headache and dizziness  Wt Readings from Last 3 Encounters:  09/12/20 237 lb 5 oz (107.6 kg)  08/28/20 232 lb 8 oz (105.5 kg)  06/04/20 232 lb (105.2 kg)   40.73 kg/m  Was seen last month for this after ER visit   Dx with likely sinusitis and tx with prednisone taper (past h/o sinus surg)   Disc vertiginous symptoms - was px meclizine in ER   She improved with the prednisone  Once she weaned down -it started coming back  More sensitivity to light now  Yesterday L side of face felt puffy-under eye (tender there as well)  Blowing nose a lot- not moving much  Mucous is still clear but occ some yellow   She made ENT appt- end of the month   Dizziness is occ and not as bad    Patient Active Problem List   Diagnosis Date Noted  . Dizziness 08/28/2020  . Sinusitis 08/28/2020  . Dysthymia 12/26/2019  . Fibroid uterus 12/26/2019  . Right leg swelling 12/11/2019  . Fungal nail infection 10/11/2019  . Contraception management 10/11/2019  . Great toe pain, right 12/05/2018  . Left anterior knee pain 04/26/2018  . Injury of left toe 01/25/2018  . Left hand paresthesia 11/22/2017  . GERD (gastroesophageal reflux disease) 11/22/2017  . H/O vaginitis 08/10/2017  . Bell's palsy 07/07/2017  . Headache 07/07/2017  . Hair loss 03/25/2016  . Vitamin D deficiency 01/15/2016  . Routine general medical examination at a health care facility 05/29/2014  . Encounter for routine gynecological examination 05/29/2014  . Screening for HIV (human immunodeficiency virus)  05/29/2014  . Encounter for screening mammogram for breast cancer 05/29/2014  . Joint pain 04/10/2014  . History of DVT (deep vein thrombosis) 07/06/2012  . Varicose veins of lower extremities with other complications 57/04/7791  . Venous (peripheral) insufficiency 12/23/2011  . Phlebitis and thrombophlebitis of the leg 11/06/2011  . Venous insufficiency, peripheral 10/26/2011  . Pedal edema 10/26/2011  . IRREGULAR MENSES 02/29/2008  . 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 Arthritis    right hand  . DVT (deep venous thrombosis) ([place]) 2013  . GERD (gastroesophageal reflux disease)   . PONV (postoperative nausea and vomiting)    after cholecystectomy  . Sleep apnea    CPAP  . Varicose veins    Past Surgical History:  Procedure Laterality Date  . CHOLECYSTECTOMY  Feb. 2006   Gall Bladder  . HAND SURGERY  2016  . NASAL SEPTOPLASTY W/ TURBINOPLASTY Bilateral 10/01/2017   Procedure: NASAL SEPTOPLASTY WITH SUBMUCOCELE RESECTION OF TURBINATE;  Surgeon: Beverly Gust, MD;  Location: [facility];  Service: ENT;  Laterality: Bilateral;  Sleep apnea  . ROBOTIC ASSISTED LAPAROSCOPIC HYSTERECTOMY AND SALPINGECTOMY Bilateral 03/13/2020   Procedure: XI ROBOTIC ASSISTED LAPAROSCOPIC HYSTERECTOMY AND SALPINGECTOMY;  Surgeon: Homero Fellers, MD;  Location: [facility];  Service: Gynecology;  Laterality: Bilateral;  . THROMBECTOMY Right 01/13/12 and  01/21/12   Right iliac vein stent and Vein thrombosis   Social History  Tobacco Use  . Smoking status: Never Smoker  . Smokeless tobacco: Never Used  Vaping Use  . Vaping Use: Never used  Substance Use Topics  . Alcohol use: No    Alcohol/week: 0.0 standard drinks  . Drug use: No   Family History  Problem Relation Age of Onset  . Arthritis Mother   . Cancer Mother        uterine   . Heart disease Father        Heart Disease before age 48  . Cancer Father        skin   . Hypertension  Father   . Heart attack Father    Allergies  Allergen Reactions  . Penicillins Rash  . Sulfa Antibiotics Rash   Current Outpatient Medications on File Prior to Visit  Medication Sig Dispense Refill  . acetaminophen (TYLENOL) 650 MG CR tablet Take 1,300 mg by mouth every 8 (eight) hours as needed for pain.    . Cholecalciferol (VITAMIN D3) 2000 units TABS Take 1 tablet by mouth daily.    . NON FORMULARY Support hose to the waist for dx of venous insufficiency and edema 15-20 mm Hg    . omeprazole (PRILOSEC) 20 MG capsule Take 20 mg by mouth daily.     No current facility-administered medications on file prior to visit.    Review of Systems  Constitutional: Negative for activity change, appetite change, fatigue, fever and unexpected weight change.  HENT: Positive for congestion, ear pain, sinus pressure and sinus pain. Negative for rhinorrhea and sore throat.   Eyes: Negative for pain, redness and visual disturbance.  Respiratory: Negative for cough, shortness of breath and wheezing.   Cardiovascular: Negative for chest pain and palpitations.  Gastrointestinal: Negative for abdominal pain, blood in stool, constipation and diarrhea.  Endocrine: Negative for polydipsia and polyuria.  Genitourinary: Negative for dysuria, frequency and urgency.  Musculoskeletal: Negative for arthralgias, back pain and myalgias.  Skin: Negative for pallor and rash.  Allergic/Immunologic: Negative for environmental allergies.  Neurological: Negative for dizziness, syncope and headaches.  Hematological: Negative for adenopathy. Does not bruise/bleed easily.  Psychiatric/Behavioral: Negative for decreased concentration and dysphoric mood. The patient is not nervous/anxious.        Objective:   Physical Exam Constitutional:      General: She is not in acute distress.    Appearance: Normal appearance. She is well-developed. She is obese. She is not ill-appearing.  HENT:     Head: Normocephalic and  atraumatic.     Comments: Nares are injected and congested  Clear pnd   Bilateral maxillary sinus tenderness -worse on the L     Right Ear: Tympanic membrane, ear canal and external ear normal.     Left Ear: Ear canal and external ear normal.     Ears:     Comments: L TM is dull but not erythematous    Nose: Congestion and rhinorrhea present.     Mouth/Throat:     Mouth: Mucous membranes are moist.     Pharynx: No oropharyngeal exudate or posterior oropharyngeal erythema.     Comments: Clear pnd Eyes:     General:        Right eye: No discharge.        Left eye: No discharge.     Conjunctiva/sclera: Conjunctivae normal.     Pupils: Pupils are equal, round, and reactive to light.  Cardiovascular:     Rate and Rhythm: Normal rate and regular rhythm.  Pulmonary:  Effort: Pulmonary effort is normal. No respiratory distress.     Breath sounds: Normal breath sounds. No stridor. No wheezing, rhonchi or rales.  Musculoskeletal:     Cervical back: Normal range of motion and neck supple.  Lymphadenopathy:     Cervical: No cervical adenopathy.  Skin:    General: Skin is warm and dry.     Findings: No erythema or rash.  Neurological:     Mental Status: She is alert.     Cranial Nerves: No cranial nerve deficit.  Psychiatric:        Mood and Affect: Mood normal.           Assessment & Plan:   Problem List Items Addressed This Visit      Respiratory   Sinusitis - Primary    Improved initially with prednisone but now symptoms returned with facial pain (worse on L) and some purulent nasal mucous  Pt is planning f/u with ENT Px zpak (allergic to several abx) and prednisone Will do saline irrigation  Update if not starting to improve in a week or if worsening         Relevant Medications   azithromycin (ZITHROMAX Z-PAK) 250 MG tablet   predniSONE (DELTASONE) 10 MG tablet     Other   Dizziness    Improved with initial tx of sinusitis  Still present but not as bad   Will continue tx for sinusitis (now bact) with abx and prednisone  Update if not starting to improve in a week or if worsening   Pt also has ENT appt upcoming

## 2020-09-13 NOTE — Assessment & Plan Note (Signed)
Improved initially with prednisone but now symptoms returned with facial pain (worse on L) and some purulent nasal mucous  Pt is planning f/u with ENT Px zpak (allergic to several abx) and prednisone Will do saline irrigation  Update if not starting to improve in a week or if worsening

## 2020-09-13 NOTE — Assessment & Plan Note (Addendum)
Improved with initial tx of sinusitis  Still present but not as bad  Will continue tx for sinusitis (now bact) with abx and prednisone  Update if not starting to improve in a week or if worsening   Pt also has ENT appt upcoming

## 2021-02-14 ENCOUNTER — Encounter: Payer: Self-pay | Admitting: Family Medicine

## 2021-02-14 ENCOUNTER — Ambulatory Visit: Payer: Managed Care, Other (non HMO) | Admitting: Family Medicine

## 2021-02-14 ENCOUNTER — Other Ambulatory Visit: Payer: Self-pay

## 2021-02-14 VITALS — BP 130/70 | HR 85 | Temp 98.0°F | Ht 64.0 in | Wt 245.2 lb

## 2021-02-14 DIAGNOSIS — F341 Dysthymic disorder: Secondary | ICD-10-CM | POA: Diagnosis not present

## 2021-02-14 MED ORDER — FLUOXETINE HCL 10 MG PO CAPS: 10.0000 mg | ORAL_CAPSULE | Freq: Every day | ORAL | 0 refills | Status: DC

## 2021-02-14 NOTE — Progress Notes (Signed)
Subjective:    Patient ID: Kari Rice, female    DOB: Mar 25, 1976, 45 y.o.   MRN: [id number]  This visit occurred during the SARS-CoV-2 public health emergency.  Safety protocols were in place, including screening questions prior to the visit, additional usage of staff PPE, and extensive cleaning of exam room while observing appropriate contact time as indicated for disinfecting solutions.   HPI Pt presents with c/o depressed mood   Wt Readings from Last 3 Encounters:  02/14/21 245 lb 4 oz (111.2 kg)  09/12/20 237 lb 5 oz (107.6 kg)  08/28/20 232 lb 8 oz (105.5 kg)   42.10 kg/m  H/o dysthymia in the past -after a hysterectomy   Took fluoxetine 20 mg last year  Did counseling -it helped    More trouble lately  Did well for several months-then worse since the summer  Feels down  Also irritable (this is new)   Sweating at night  Hot flashes   Still has ovaries - going through menopause  Some stress-not a big change  Still in the same job  (long hours) Some changes there   Sleep Appetite-eating more than she should  Impulsive at times   Does not feel need to talk to a therapist   No SI  Patient Active Problem List   Diagnosis Date Noted   Dizziness 08/28/2020   Sinusitis 08/28/2020   Dysthymia 12/26/2019   Fibroid uterus 12/26/2019   Right leg swelling 12/11/2019   Fungal nail infection 10/11/2019   Contraception management 10/11/2019   Great toe pain, right 12/05/2018   Left anterior knee pain 04/26/2018   Injury of left toe 01/25/2018   Left hand paresthesia 11/22/2017   GERD (gastroesophageal reflux disease) 11/22/2017   H/O vaginitis 08/10/2017   Bell's palsy 07/07/2017   Headache 07/07/2017   Hair loss 03/25/2016   Vitamin D deficiency 01/15/2016   Routine general medical examination at a health care facility 05/29/2014   Encounter for routine gynecological examination 05/29/2014   Screening for HIV (human immunodeficiency virus) 05/29/2014    Encounter for screening mammogram for breast cancer 05/29/2014   Joint pain 04/10/2014   History of DVT (deep vein thrombosis) 07/06/2012   Varicose veins of lower extremities with other complications 61/44/3154   Venous (peripheral) insufficiency 12/23/2011   Phlebitis and thrombophlebitis of the leg 11/06/2011   Venous insufficiency, peripheral 10/26/2011   Pedal edema 10/26/2011   IRREGULAR MENSES 02/29/2008   TRICHOMONAL VAGINITIS 03/18/2007   Past Medical History:  Diagnosis Date   Arthritis    right hand   DVT (deep venous thrombosis) ([place]) 2013   GERD (gastroesophageal reflux disease)    PONV (postoperative nausea and vomiting)    after cholecystectomy   Sleep apnea    CPAP   Varicose veins    Past Surgical History:  Procedure Laterality Date   CHOLECYSTECTOMY  Feb. 2006   [place] Bladder   HAND SURGERY  2016   NASAL SEPTOPLASTY W/ TURBINOPLASTY Bilateral 10/01/2017   Procedure: NASAL SEPTOPLASTY WITH SUBMUCOCELE RESECTION OF TURBINATE;  Surgeon: Beverly Gust, MD;  Location: [place];  Service: ENT;  Laterality: Bilateral;  Sleep apnea   ROBOTIC ASSISTED LAPAROSCOPIC HYSTERECTOMY AND SALPINGECTOMY Bilateral 03/13/2020   Procedure: XI ROBOTIC ASSISTED LAPAROSCOPIC HYSTERECTOMY AND SALPINGECTOMY;  Surgeon: Homero Fellers, MD;  Location: [facility];  Service: Gynecology;  Laterality: Bilateral;   THROMBECTOMY Right 01/13/12 and  01/21/12   Right iliac vein stent and Vein thrombosis   Social History  Tobacco Use   Smoking status: Never   Smokeless tobacco: Never  Vaping Use   Vaping Use: Never used  Substance Use Topics   Alcohol use: No    Alcohol/week: 0.0 standard drinks   Drug use: No   Family History  Problem Relation Age of Onset   Arthritis Mother    Cancer Mother        uterine    Heart disease Father        Heart Disease before age 79   Cancer Father        skin    Hypertension Father    Heart attack Father    Allergies  Allergen  Reactions   Penicillins Rash   Sulfa Antibiotics Rash   Current Outpatient Medications on File Prior to Visit  Medication Sig Dispense Refill   acetaminophen (TYLENOL) 650 MG CR tablet Take 1,300 mg by mouth every 8 (eight) hours as needed for pain.     Cholecalciferol (VITAMIN D3) 2000 units TABS Take 1 tablet by mouth daily.     NON FORMULARY Support hose to the waist for dx of venous insufficiency and edema 15-20 mm Hg     omeprazole (PRILOSEC) 20 MG capsule Take 20 mg by mouth daily.     No current facility-administered medications on file prior to visit.     Review of Systems  Constitutional:  Positive for fatigue. Negative for activity change, appetite change, fever and unexpected weight change.  HENT:  Negative for congestion, ear pain, rhinorrhea, sinus pressure and sore throat.   Eyes:  Negative for pain, redness and visual disturbance.  Respiratory:  Negative for cough, shortness of breath and wheezing.   Cardiovascular:  Negative for chest pain and palpitations.  Gastrointestinal:  Negative for abdominal pain, blood in stool, constipation and diarrhea.  Endocrine: Negative for polydipsia and polyuria.  Genitourinary:  Negative for dysuria, frequency and urgency.  Musculoskeletal:  Negative for arthralgias, back pain and myalgias.  Skin:  Negative for pallor and rash.  Allergic/Immunologic: Negative for environmental allergies.  Neurological:  Negative for dizziness, syncope and headaches.  Hematological:  Negative for adenopathy. Does not bruise/bleed easily.  Psychiatric/Behavioral:  Positive for decreased concentration, dysphoric mood and sleep disturbance. Negative for self-injury and suicidal ideas. The patient is nervous/anxious.       Objective:   Physical Exam Constitutional:      General: She is not in acute distress.    Appearance: Normal appearance. She is well-developed. She is obese.  HENT:     Head: Normocephalic and atraumatic.  Eyes:      Conjunctiva/sclera: Conjunctivae normal.     Pupils: Pupils are equal, round, and reactive to light.  Neck:     Thyroid: No thyromegaly.     Vascular: No carotid bruit or JVD.  Cardiovascular:     Rate and Rhythm: Normal rate and regular rhythm.     Heart sounds: Normal heart sounds.    No gallop.  Pulmonary:     Effort: Pulmonary effort is normal. No respiratory distress.     Breath sounds: Normal breath sounds. No wheezing or rales.  Abdominal:     General: There is no abdominal bruit.  Musculoskeletal:     Cervical back: Normal range of motion and neck supple.     Right lower leg: No edema.     Left lower leg: No edema.  Lymphadenopathy:     Cervical: No cervical adenopathy.  Skin:    General: Skin is warm and  dry.     Coloration: Skin is not pale.     Findings: No rash.  Neurological:     Mental Status: She is alert.     Coordination: Coordination normal.     Deep Tendon Reflexes: Reflexes are normal and symmetric. Reflexes normal.  Psychiatric:        Attention and Perception: Attention normal.        Mood and Affect: Mood is depressed.        Speech: Speech normal.        Behavior: Behavior normal.        Cognition and Memory: Cognition and memory normal.     Comments: Speaks candidly about her stressors   Pleasant           Assessment & Plan:   Problem List Items Addressed This Visit       Other   Dysthymia - Primary    Worsened lately in the setting of menopausal change  Feeling both down and irritable w/o SI  Reviewed stressors/ coping techniques/symptoms/ support sources/ tx options and side effects in detail today  Plan to begin fluxetine 10 mg to titrate to 20 mg in 1-2 wk Discussed expectations of SSRI medication including time to effectiveness and mechanism of action, also poss of side effects (early and late)- including mental fuzziness, weight or appetite change, nausea and poss of worse dep or anxiety (even suicidal thoughts)  Pt voiced  understanding and will stop med and update if this occurs   She tolerated this well in the past  Offered counseling referral. She declines  Discussed imp of good self care incl exercise and outdoor time      Relevant Medications   FLUoxetine (PROZAC) 10 MG capsule

## 2021-02-14 NOTE — Patient Instructions (Addendum)
Let's start back with fluoxetine 10 mg   Take 1 daily  In 1-2 weeks if doing well go up to 2 pills per day   Then request a new px for the 20 mg when ready   If side effects or if worse /suicidal/ worse mood-let us know   Take care of yourself  Exercise  Get outdoors when you can  Do things for yourself    Follow up in 2 months   Flu shot today

## 2021-02-15 NOTE — Assessment & Plan Note (Signed)
Worsened lately in the setting of menopausal change  Feeling both down and irritable w/o SI  Reviewed stressors/ coping techniques/symptoms/ support sources/ tx options and side effects in detail today  Plan to begin fluxetine 10 mg to titrate to 20 mg in 1-2 wk Discussed expectations of SSRI medication including time to effectiveness and mechanism of action, also poss of side effects (early and late)- including mental fuzziness, weight or appetite change, nausea and poss of worse dep or anxiety (even suicidal thoughts)  Pt voiced understanding and will stop med and update if this occurs   She tolerated this well in the past  Offered counseling referral. She declines  Discussed imp of good self care incl exercise and outdoor time

## 2021-03-26 ENCOUNTER — Other Ambulatory Visit: Payer: Self-pay | Admitting: Family Medicine

## 2021-03-27 MED ORDER — FLUOXETINE HCL 20 MG PO CAPS: 20.0000 mg | ORAL_CAPSULE | Freq: Every day | ORAL | 3 refills | Status: DC

## 2021-04-18 ENCOUNTER — Other Ambulatory Visit: Payer: Self-pay

## 2021-04-18 ENCOUNTER — Encounter: Payer: Self-pay | Admitting: Family Medicine

## 2021-04-18 ENCOUNTER — Ambulatory Visit: Payer: Managed Care, Other (non HMO) | Admitting: Family Medicine

## 2021-04-18 VITALS — BP 126/82 | HR 62 | Temp 97.5°F | Ht 64.0 in | Wt 242.1 lb

## 2021-04-18 DIAGNOSIS — H9201 Otalgia, right ear: Secondary | ICD-10-CM | POA: Insufficient documentation

## 2021-04-18 DIAGNOSIS — F341 Dysthymic disorder: Secondary | ICD-10-CM | POA: Diagnosis not present

## 2021-04-18 DIAGNOSIS — Z1211 Encounter for screening for malignant neoplasm of colon: Secondary | ICD-10-CM

## 2021-04-18 DIAGNOSIS — Z23 Encounter for immunization: Secondary | ICD-10-CM

## 2021-04-18 NOTE — Progress Notes (Signed)
Subjective:    Patient ID: Kari Rice, female    DOB: 1975/08/07, 46 y.o.   MRN: [id number]  This visit occurred during the SARS-CoV-2 public health emergency.  Safety protocols were in place, including screening questions prior to the visit, additional usage of staff PPE, and extensive cleaning of exam room while observing appropriate contact time as indicated for disinfecting solutions.   HPI Pt presents for f/u of dysthymia  Wt Readings from Last 3 Encounters:  04/18/21 242 lb 2 oz (109.8 kg)  02/14/21 245 lb 4 oz (111.2 kg)  09/12/20 237 lb 5 oz (107.6 kg)   41.56 kg/m  Doing ok overall   A little time off for the holidays    Last visit discussed depression symptoms in the setting of menopausal change  Did not do well with the 10 mg -sleepy all the time  Px fluoxetine to titrate up to 20 mg daily -doing much better   Improved mood  Not as on edge - can go with the flow  More motivated to do things in the house  Irritability is better / less often  (occ at work)  Less overwhelmed   Got a cat - loves that   Menopause  Feels hot (even in the cold weather)  Not as bad right now   Sleep is fair/cat wakes you up   Making effort towards self care  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)  May be eating better   Wants to stick with this dose for now/give it more time   Stress is always high at work- long hours/short staffed  Holidays went really well   R ear is popping today  No pain or congestion   Has a mole on her back/rough in texture   Wants colonoscopy referral [place]   Patient Active Problem List   Diagnosis Date Noted   Colon cancer screening 04/18/2021   Ear discomfort, right 04/18/2021   Dizziness 08/28/2020   Sinusitis 08/28/2020   Dysthymia 12/26/2019   Fibroid uterus 12/26/2019   Right leg swelling 12/11/2019   Fungal nail infection 10/11/2019   Contraception management 10/11/2019   Great toe pain, right 12/05/2018   Left  anterior knee pain 04/26/2018   Injury of left toe 01/25/2018   Left hand paresthesia 11/22/2017   GERD (gastroesophageal reflux disease) 11/22/2017   H/O vaginitis 08/10/2017   Bell's palsy 07/07/2017   Headache 07/07/2017   Hair loss 03/25/2016   Vitamin D deficiency 01/15/2016   Routine general medical examination at a health care facility 05/29/2014   Encounter for routine gynecological examination 05/29/2014   Screening for HIV (human immunodeficiency virus) 05/29/2014   Encounter for screening mammogram for breast cancer 05/29/2014   Joint pain 04/10/2014   History of DVT (deep vein thrombosis) 07/06/2012   Varicose veins of lower extremities with other complications 40/98/1191   Venous (peripheral) insufficiency 12/23/2011   Phlebitis and thrombophlebitis of the leg 11/06/2011   Venous insufficiency, peripheral 10/26/2011   Pedal edema 10/26/2011   IRREGULAR MENSES 02/29/2008   TRICHOMONAL VAGINITIS 03/18/2007   Past Medical History:  Diagnosis Date   Arthritis    right hand   DVT (deep venous thrombosis) ([place]) 2013   GERD (gastroesophageal reflux disease)    PONV (postoperative nausea and vomiting)    after cholecystectomy   Sleep apnea    CPAP   Varicose veins    Past Surgical History:  Procedure Laterality Date   CHOLECYSTECTOMY  Feb. 2006  [place] Bladder   HAND SURGERY  2016   NASAL SEPTOPLASTY W/ TURBINOPLASTY Bilateral 10/01/2017   Procedure: NASAL SEPTOPLASTY WITH SUBMUCOCELE RESECTION OF TURBINATE;  Surgeon: Beverly Gust, MD;  Location: [place];  Service: ENT;  Laterality: Bilateral;  Sleep apnea   ROBOTIC ASSISTED LAPAROSCOPIC HYSTERECTOMY AND SALPINGECTOMY Bilateral 03/13/2020   Procedure: XI ROBOTIC ASSISTED LAPAROSCOPIC HYSTERECTOMY AND SALPINGECTOMY;  Surgeon: Homero Fellers, MD;  Location: [facility];  Service: Gynecology;  Laterality: Bilateral;   THROMBECTOMY Right 01/13/12 and  01/21/12   Right iliac vein stent and Vein thrombosis    Social History   Tobacco Use   Smoking status: Never   Smokeless tobacco: Never  Vaping Use   Vaping Use: Never used  Substance Use Topics   Alcohol use: No    Alcohol/week: 0.0 standard drinks   Drug use: No   Family History  Problem Relation Age of Onset   Arthritis Mother    Cancer Mother        uterine    Heart disease Father        Heart Disease before age 67   Cancer Father        skin    Hypertension Father    Heart attack Father    Allergies  Allergen Reactions   Penicillins Rash   Sulfa Antibiotics Rash   Current Outpatient Medications on File Prior to Visit  Medication Sig Dispense Refill   acetaminophen (TYLENOL) 650 MG CR tablet Take 1,300 mg by mouth every 8 (eight) hours as needed for pain.     Cholecalciferol (VITAMIN D3) 2000 units TABS Take 1 tablet by mouth daily.     FLUoxetine (PROZAC) 20 MG capsule Take 1 capsule (20 mg total) by mouth daily. 90 capsule 3   NON FORMULARY Support hose to the waist for dx of venous insufficiency and edema 15-20 mm Hg     omeprazole (PRILOSEC) 20 MG capsule Take 20 mg by mouth daily.     No current facility-administered medications on file prior to visit.     Review of Systems  Constitutional:  Negative for activity change, appetite change, fatigue, fever and unexpected weight change.  HENT:  Negative for congestion, ear discharge, ear pain, rhinorrhea, sinus pressure and sore throat.        R ear flutters  Eyes:  Negative for pain, redness and visual disturbance.  Respiratory:  Negative for cough, shortness of breath and wheezing.   Cardiovascular:  Negative for chest pain and palpitations.  Gastrointestinal:  Negative for abdominal pain, blood in stool, constipation and diarrhea.  Endocrine: Negative for polydipsia and polyuria.  Genitourinary:  Negative for dysuria, frequency and urgency.  Musculoskeletal:  Negative for arthralgias, back pain and myalgias.  Skin:  Negative for pallor and rash.   Allergic/Immunologic: Negative for environmental allergies.  Neurological:  Negative for dizziness, syncope and headaches.  Hematological:  Negative for adenopathy. Does not bruise/bleed easily.  Psychiatric/Behavioral:  Positive for sleep disturbance. Negative for decreased concentration and dysphoric mood. The patient is nervous/anxious.       Objective:   Physical Exam Constitutional:      General: She is not in acute distress.    Appearance: Normal appearance. She is obese. She is not ill-appearing or diaphoretic.  HENT:     Right Ear: Tympanic membrane, ear canal and external ear normal.     Left Ear: Tympanic membrane, ear canal and external ear normal.  Eyes:     Conjunctiva/sclera: Conjunctivae normal.  Pupils: Pupils are equal, round, and reactive to light.  Neck:     Vascular: No carotid bruit.  Cardiovascular:     Rate and Rhythm: Normal rate and regular rhythm.     Heart sounds: Normal heart sounds.  Pulmonary:     Effort: Pulmonary effort is normal. No respiratory distress.     Breath sounds: Normal breath sounds.  Musculoskeletal:     Cervical back: Neck supple.  Lymphadenopathy:     Cervical: No cervical adenopathy.  Skin:    General: Skin is warm and dry.     Findings: No erythema or rash.     Comments: Fair complexion  Scattered tan SKs on back 2-4 mm diameter  Neurological:     Mental Status: She is alert.     Sensory: No sensory deficit.     Comments: No tremor  Psychiatric:        Mood and Affect: Mood normal.          Assessment & Plan:   Problem List Items Addressed This Visit       Other   Dysthymia - Primary    Doing much better with fluoxetine 20 mg daily  No side effects Stress is a little less Reviewed stressors/ coping techniques/symptoms/ support sources/ tx options and side effects in detail today Working on self care, plans to start regular exercise  Has a pet (cat) now which has also helped  Plan to continue this  dose Has options of counseling as well  Will update if no further imp or worse      Colon cancer screening    Referred for first screening colonoscopy  Wants to go to [facility]  Will call to schedule      Relevant Orders   Ambulatory referral to Gastroenterology   Ear discomfort, right    Pt notes TM fluttering in R ear  Nl exam today  No hearing loss  Will watch for pain or worsening symptom May be due to pressure changes or stapedial tendon myoclonus Will watch for tinnitus

## 2021-04-18 NOTE — Assessment & Plan Note (Addendum)
Pt notes TM fluttering in R ear  Nl exam today  No hearing loss  Will watch for pain or worsening symptom May be due to pressure changes or stapedial tendon myoclonus Will watch for tinnitus

## 2021-04-18 NOTE — Assessment & Plan Note (Signed)
Doing much better with fluoxetine 20 mg daily  No side effects Stress is a little less Reviewed stressors/ coping techniques/symptoms/ support sources/ tx options and side effects in detail today Working on self care, plans to start regular exercise  Has a pet (cat) now which has also helped  Plan to continue this dose Has options of counseling as well  Will update if no further imp or worse

## 2021-04-18 NOTE — Patient Instructions (Addendum)
Flu shot today   Continue the fluoxetine  Keep me posted if anything changes  Work on self care  Exercise/healthy diet  Try not to skip meals  Drink water   Socialize when you can    If the ear gets worse let me know   You have some seb keratosis moles  - they are ok   I will place a referral for colonoscopy  Check in with [place] in a few weeks if you do not hear before then to find out which clinic gets the referral    Due to there being a large influx of referrals the GI offices are currently scheduling appointments as far out as late Aug/Sept 2022. They have asked that you call their office regarding your referral and scheduling needs. Otherwise, they will reach out to you as soon as they are able. They are working diligently to get patients called and scheduled as soon as possible.  Colonoscopies will be called according to date/time of the referral entry as these are not of urgent need.    [facility]  [phone] [facility]  [phone] [facility]  [phone]  [facility] can be considered as well. They may be able to book sooner appointments. You can reach [facility] at [phone].   Thank you for your patience and please let us know if you have any questions or concerns.    Thank You!  -  Referrals

## 2021-04-18 NOTE — Assessment & Plan Note (Signed)
Referred for first screening colonoscopy  Wants to go to [facility]  Will call to schedule

## 2021-04-19 ENCOUNTER — Encounter: Payer: Self-pay | Admitting: *Deleted

## 2021-04-21 ENCOUNTER — Telehealth: Payer: Self-pay

## 2021-04-21 NOTE — Telephone Encounter (Signed)
CALLED PATIENT NO ANSWER LEFT VOICEMAIL FOR A CALL BACK ? ?

## 2021-04-22 ENCOUNTER — Other Ambulatory Visit: Payer: Self-pay

## 2021-04-22 DIAGNOSIS — Z1211 Encounter for screening for malignant neoplasm of colon: Secondary | ICD-10-CM

## 2021-04-22 MED ORDER — CLENPIQ 10-3.5-12 MG-GM -GM/160ML PO SOLN: 1.0000 | ORAL | 0 refills | Status: DC

## 2021-04-22 NOTE — Progress Notes (Signed)
Gastroenterology Pre-Procedure Review  Request Date: 05/23/2021 Requesting Physician: Dr. Marius Ditch  PATIENT REVIEW QUESTIONS: The patient responded to the following health history questions as indicated:    1. Are you having any GI issues? no 2. Do you have a personal history of Polyps? no 3. Do you have a family history of Colon Cancer or Polyps? yes (dad polyps and lung cancer) 4. Diabetes Mellitus? no 5. Joint replacements in the past 12 months?no 6. Major health problems in the past 3 months?no 7. Any artificial heart valves, MVP, or defibrillator?no    MEDICATIONS & ALLERGIES:    Patient reports the following regarding taking any anticoagulation/antiplatelet therapy:   Plavix, Coumadin, Eliquis, Xarelto, Lovenox, Pradaxa, Brilinta, or Effient? no Aspirin? no  Patient confirms/reports the following medications:  Current Outpatient Medications  Medication Sig Dispense Refill   acetaminophen (TYLENOL) 650 MG CR tablet Take 1,300 mg by mouth every 8 (eight) hours as needed for pain.     Cholecalciferol (VITAMIN D3) 2000 units TABS Take 1 tablet by mouth daily.     FLUoxetine (PROZAC) 20 MG capsule Take 1 capsule (20 mg total) by mouth daily. 90 capsule 3   NON FORMULARY Support hose to the waist for dx of venous insufficiency and edema 15-20 mm Hg     omeprazole (PRILOSEC) 20 MG capsule Take 20 mg by mouth daily.     No current facility-administered medications for this visit.    Patient confirms/reports the following allergies:  Allergies  Allergen Reactions   Penicillins Rash   Sulfa Antibiotics Rash    No orders of the defined types were placed in this encounter.   AUTHORIZATION INFORMATION Primary Insurance: 1D#: Group #:  Secondary Insurance: 1D#: Group #:  SCHEDULE INFORMATION: Date: 05/23/2021 Time: Location: [facility]

## 2021-05-23 ENCOUNTER — Encounter
Admission: RE | Disposition: A | Discharge status: Home / Self Care | Payer: Self-pay | Source: Home / Self Care | Attending: Gastroenterology

## 2021-05-23 ENCOUNTER — Ambulatory Visit
Admission: RE | Admit: 2021-05-23 | Discharge: 2021-05-23 | Disposition: A | Discharge status: Home / Self Care | Payer: Managed Care, Other (non HMO) | Attending: Gastroenterology | Admitting: Gastroenterology

## 2021-05-23 ENCOUNTER — Ambulatory Visit: Payer: Managed Care, Other (non HMO) | Admitting: Anesthesiology

## 2021-05-23 DIAGNOSIS — K5730 Diverticulosis of large intestine without perforation or abscess without bleeding: Secondary | ICD-10-CM

## 2021-05-23 DIAGNOSIS — Z1211 Encounter for screening for malignant neoplasm of colon: Secondary | ICD-10-CM

## 2021-05-23 DIAGNOSIS — E669 Obesity, unspecified: Secondary | ICD-10-CM | POA: Diagnosis not present

## 2021-05-23 DIAGNOSIS — F32A Depression, unspecified: Secondary | ICD-10-CM | POA: Insufficient documentation

## 2021-05-23 DIAGNOSIS — G4730 Sleep apnea, unspecified: Secondary | ICD-10-CM | POA: Diagnosis not present

## 2021-05-23 DIAGNOSIS — Z86718 Personal history of other venous thrombosis and embolism: Secondary | ICD-10-CM | POA: Insufficient documentation

## 2021-05-23 DIAGNOSIS — K219 Gastro-esophageal reflux disease without esophagitis: Secondary | ICD-10-CM | POA: Insufficient documentation

## 2021-05-23 DIAGNOSIS — Z6839 Body mass index (BMI) 39.0-39.9, adult: Secondary | ICD-10-CM | POA: Diagnosis not present

## 2021-05-23 HISTORY — PX: COLONOSCOPY WITH PROPOFOL: SHX5780

## 2021-05-23 SURGERY — COLONOSCOPY WITH PROPOFOL
Anesthesia: General

## 2021-05-23 MED ORDER — PROPOFOL 500 MG/50ML IV EMUL
INTRAVENOUS | Status: AC
  Filled 2021-05-23: qty 50

## 2021-05-23 MED ORDER — DEXMEDETOMIDINE (PRECEDEX) IN NS 20 MCG/5ML (4 MCG/ML) IV SYRINGE
PREFILLED_SYRINGE | INTRAVENOUS | Status: DC | PRN
  Administered 2021-05-23: 12 ug via INTRAVENOUS

## 2021-05-23 MED ORDER — PHENYLEPHRINE HCL-NACL 20-0.9 MG/250ML-% IV SOLN
INTRAVENOUS | Status: AC
  Filled 2021-05-23: qty 250

## 2021-05-23 MED ORDER — PROPOFOL 500 MG/50ML IV EMUL
INTRAVENOUS | Status: DC | PRN
  Administered 2021-05-23: 150 ug/kg/min via INTRAVENOUS

## 2021-05-23 MED ORDER — SODIUM CHLORIDE 0.9 % IV SOLN
INTRAVENOUS | Status: DC
  Administered 2021-05-23: 20 mL/h via INTRAVENOUS

## 2021-05-23 MED ORDER — LIDOCAINE HCL (CARDIAC) PF 100 MG/5ML IV SOSY
PREFILLED_SYRINGE | INTRAVENOUS | Status: DC | PRN
  Administered 2021-05-23: 40 mg via INTRAVENOUS

## 2021-05-23 MED ORDER — PROPOFOL 10 MG/ML IV BOLUS
INTRAVENOUS | Status: DC | PRN
Start: 2021-05-23 — End: 2021-05-23
  Administered 2021-05-23: 20 mg via INTRAVENOUS

## 2021-05-23 NOTE — H&P (Signed)
Cephas Darby, MD [address]  [place]  [place], [place] [address]  Main: [phone]  Fax: [phone] Pager: [phone]  Primary Care Physician:  Tower, Wynelle Fanny, MD Primary Gastroenterologist:  Dr. Cephas Darby  Pre-Procedure History & Physical: HPI:  Kari Rice is a 47 y.o. female is here for an colonoscopy.   Past Medical History:  Diagnosis Date   Arthritis    right hand   DVT (deep venous thrombosis) ([place]) 2013   GERD (gastroesophageal reflux disease)    PONV (postoperative nausea and vomiting)    after cholecystectomy   Sleep apnea    CPAP   Varicose veins     Past Surgical History:  Procedure Laterality Date   CHOLECYSTECTOMY  Feb. 2006   [place] Bladder   HAND SURGERY  2016   NASAL SEPTOPLASTY W/ TURBINOPLASTY Bilateral 10/01/2017   Procedure: NASAL SEPTOPLASTY WITH SUBMUCOCELE RESECTION OF TURBINATE;  Surgeon: Beverly Gust, MD;  Location: [facility];  Service: ENT;  Laterality: Bilateral;  Sleep apnea   ROBOTIC ASSISTED LAPAROSCOPIC HYSTERECTOMY AND SALPINGECTOMY Bilateral 03/13/2020   Procedure: XI ROBOTIC ASSISTED LAPAROSCOPIC HYSTERECTOMY AND SALPINGECTOMY;  Surgeon: Homero Fellers, MD;  Location: [facility];  Service: Gynecology;  Laterality: Bilateral;   THROMBECTOMY Right 01/13/12 and  01/21/12   Right iliac vein stent and Vein thrombosis    Prior to Admission medications   Medication Sig Start Date End Date Taking? Authorizing Provider  acetaminophen (TYLENOL) 650 MG CR tablet Take 1,300 mg by mouth every 8 (eight) hours as needed for pain.   Yes [provider]  Cholecalciferol (VITAMIN D3) 2000 units TABS Take 1 tablet by mouth daily.   Yes [provider]  FLUoxetine (PROZAC) 20 MG capsule Take 1 capsule (20 mg total) by mouth daily. 03/27/21  Yes Tower, Wynelle Fanny, MD  NON FORMULARY Support hose to the waist for dx of venous insufficiency and edema 15-20 mm Hg   Yes [provider]   omeprazole (PRILOSEC) 20 MG capsule Take 20 mg by mouth daily.   Yes [provider]  Sod Picosulfate-Mag Ox-Cit Acd (CLENPIQ) 10-3.5-12 MG-GM -GM/160ML SOLN Take 1 kit by mouth as directed. At 5 PM evening before procedure, drink 1 bottle of Clenpiq, hydrate, drink (5) 8 oz of water. Then do the same thing 5 hours prior to your procedure. 04/22/21  Yes Lin Landsman, MD    Allergies as of 04/22/2021 - Review Complete 04/22/2021  Allergen Reaction Noted   Penicillins Rash 03/18/2007   Sulfa antibiotics Rash 10/26/2011    Family History  Problem Relation Age of Onset   Arthritis Mother    Cancer Mother        uterine    Heart disease Father        Heart Disease before age 9   Cancer Father        skin    Hypertension Father    Heart attack Father     Social History   Socioeconomic History   Marital status: Divorced    Spouse name: Not on file   Number of children: Not on file   Years of education: Not on file   Highest education level: Not on file  Occupational History   Not on file  Tobacco Use   Smoking status: Never   Smokeless tobacco: Never  Vaping Use   Vaping Use: Never used  Substance and Sexual Activity   Alcohol use: No    Alcohol/week: 0.0 standard  drinks   Drug use: No   Sexual activity: Not Currently    Birth control/protection: None  Other Topics Concern   Not on file  Social History Narrative   Not on file   Social Determinants of Health   Financial Resource Strain: Not on file  Food Insecurity: Not on file  Transportation Needs: Not on file  Physical Activity: Not on file  Stress: Not on file  Social Connections: Not on file  Intimate Partner Violence: Not on file    Review of Systems: See HPI, otherwise negative ROS  Physical Exam: BP 113/86    Pulse 76    Temp (!) 96.8 F (36 C) (Temporal)    Resp 20    Ht _0  (1.626 m)    Wt 105.2 kg    LMP  (LMP Unknown)    SpO2 98%    BMI 39.82 kg/m  General:   Alert,  pleasant  and cooperative in NAD Head:  Normocephalic and atraumatic. Neck:  Supple; no masses or thyromegaly. Lungs:  Clear throughout to auscultation.    Heart:  Regular rate and rhythm. Abdomen:  Soft, nontender and nondistended. Normal bowel sounds, without guarding, and without rebound.   Neurologic:  Alert and  oriented x4;  grossly normal neurologically.  Impression/Plan: Kari Rice is here for an colonoscopy to be performed for colon cancer screening  Risks, benefits, limitations, and alternatives regarding  colonoscopy have been reviewed with the patient.  Questions have been answered.  All parties agreeable.   Sherri Sear, MD  05/23/2021, 7:24 AM

## 2021-05-23 NOTE — Transfer of Care (Signed)
Immediate Anesthesia Transfer of Care Note  Patient: Kari Rice  Procedure(s) Performed: Procedure(s): COLONOSCOPY WITH PROPOFOL (N/A)  Patient Location: PACU and Endoscopy Unit  Anesthesia Type:General  Level of Consciousness: sedated  Airway & Oxygen Therapy: Patient Spontanous Breathing and Patient connected to nasal cannula oxygen  Post-op Assessment: Report given to RN and Post -op Vital signs reviewed and stable  Post vital signs: Reviewed and stable  Last Vitals:  Vitals:   05/23/21 0701 05/23/21 0756  BP: 113/86 92/60  Pulse: 76 64  Resp: 20 17  Temp: (!) 36 C   SpO2: 32% 44%    Complications: No apparent anesthesia complications

## 2021-05-23 NOTE — Op Note (Signed)
[facility] Patient Name: Kari Rice Procedure Date: 05/23/2021 7:21 AM MRN: [id number] Account #: [id number] Date of Birth: 06-26-1975 Admit Type: Outpatient Age: 46 Room: [facility] ENDO ROOM 2 Gender: Female Note Status: Finalized Instrument Name: Park Meo [id number] Procedure:             Colonoscopy Indications:           Screening for colorectal malignant neoplasm, This is                         the patient's first colonoscopy Providers:             Lin Landsman MD, MD Medicines:             General Anesthesia Complications:         No immediate complications. Estimated blood loss: None. Procedure:             Pre-Anesthesia Assessment:                        - Prior to the procedure, a History and Physical was                         performed, and patient medications and allergies were                         reviewed. The patient is competent. The risks and                         benefits of the procedure and the sedation options and                         risks were discussed with the patient. All questions                         were answered and informed consent was obtained.                         Patient identification and proposed procedure were                         verified by the physician, the nurse, the                         anesthesiologist, the anesthetist and the technician                         in the pre-procedure area in the procedure room in the                         endoscopy suite. Mental Status Examination: alert and                         oriented. Airway Examination: normal oropharyngeal                         airway and neck mobility. Respiratory Examination:                         clear to auscultation.  CV Examination: normal.                         Prophylactic Antibiotics: The patient does not require                         prophylactic antibiotics. Prior Anticoagulants: The                          patient has taken no previous anticoagulant or                         antiplatelet agents. ASA Grade Assessment: II - A                         patient with mild systemic disease. After reviewing                         the risks and benefits, the patient was deemed in                         satisfactory condition to undergo the procedure. The                         anesthesia plan was to use general anesthesia.                         Immediately prior to administration of medications,                         the patient was re-assessed for adequacy to receive                         sedatives. The heart rate, respiratory rate, oxygen                         saturations, blood pressure, adequacy of pulmonary                         ventilation, and response to care were monitored                         throughout the procedure. The physical status of the                         patient was re-assessed after the procedure.                        After obtaining informed consent, the colonoscope was                         passed under direct vision. Throughout the procedure,                         the patient's blood pressure, pulse, and oxygen                         saturations were monitored continuously. The  Colonoscope was introduced through the anus and                         advanced to the the cecum, identified by appendiceal                         orifice and ileocecal valve. The colonoscopy was                         performed without difficulty. The patient tolerated                         the procedure well. The quality of the bowel                         preparation was evaluated using the BBPS [facility] Bowel                         Preparation Scale) with scores of: Right Colon = 3,                         Transverse Colon = 3 and Left Colon = 3 (entire mucosa                         seen well with no residual staining, small fragments                          of stool or opaque liquid). The total BBPS score                         equals 9. Findings:      The perianal and digital rectal examinations were normal. Pertinent       negatives include normal sphincter tone and no palpable rectal lesions.      Multiple diverticula were found in the recto-sigmoid colon and sigmoid       colon.      The retroflexed view of the distal rectum and anal verge was normal and       showed no anal or rectal abnormalities.      The exam was otherwise without abnormality. Impression:            - Diverticulosis in the recto-sigmoid colon and in the                         sigmoid colon.                        - The distal rectum and anal verge are normal on                         retroflexion view.                        - The examination was otherwise normal.                        - No specimens collected. Recommendation:        - Discharge patient to home (with escort).                        -  Resume previous diet today.                        - Continue present medications.                        - Repeat colonoscopy in 10 years for screening                         purposes. Procedure Code(s):     --- Professional ---                        M7544, Colorectal cancer screening; colonoscopy on                         individual not meeting criteria for high risk Diagnosis Code(s):     --- Professional ---                        Z12.11, Encounter for screening for malignant neoplasm                         of colon                        K57.30, Diverticulosis of large intestine without                         perforation or abscess without bleeding CPT copyright 2019 American Medical Association. All rights reserved. The codes documented in this report are preliminary and upon coder review may  be revised to meet current compliance requirements. Dr. Ulyess Mort Lin Landsman MD, MD 05/23/2021 7:52:54 AM This report has been  signed electronically. Number of Addenda: 0 Note Initiated On: 05/23/2021 7:21 AM Scope Withdrawal Time: 0 hours 7 minutes 27 seconds  Total Procedure Duration: 0 hours 12 minutes 1 second  Estimated Blood Loss:  Estimated blood loss: none.      [facility]

## 2021-05-23 NOTE — Anesthesia Procedure Notes (Signed)
Date/Time: 05/23/2021 7:32 AM Performed by: Doreen Salvage, CRNA Pre-anesthesia Checklist: Patient identified, Emergency Drugs available, Suction available and Patient being monitored Patient Re-evaluated:Patient Re-evaluated prior to induction Oxygen Delivery Method: Nasal cannula Induction Type: IV induction Dental Injury: Teeth and Oropharynx as per pre-operative assessment  Comments: Nasal cannula with etCO2 monitoring

## 2021-05-23 NOTE — Anesthesia Postprocedure Evaluation (Signed)
Anesthesia Post Note  Patient: Aeriel Boulay Biehn  Procedure(s) Performed: COLONOSCOPY WITH PROPOFOL  Patient location during evaluation: PACU Anesthesia Type: General Level of consciousness: awake and alert, oriented and patient cooperative Pain management: pain level controlled Vital Signs Assessment: post-procedure vital signs reviewed and stable Respiratory status: spontaneous breathing, nonlabored ventilation and respiratory function stable Cardiovascular status: blood pressure returned to baseline and stable Postop Assessment: adequate PO intake Anesthetic complications: no   No notable events documented.   Last Vitals:  Vitals:   05/23/21 0820 05/23/21 0822  BP:  103/73  Pulse: 75 71  Resp: 16 18  Temp:    SpO2: 96% 98%    Last Pain:  Vitals:   05/23/21 0750  TempSrc: Temporal  PainSc:                  Darrin Nipper

## 2021-05-23 NOTE — Anesthesia Preprocedure Evaluation (Signed)
Anesthesia Evaluation  Patient identified by MRN, date of birth, ID band Patient awake    Reviewed: Allergy & Precautions, NPO status , Patient's Chart, lab work & pertinent test results  History of Anesthesia Complications (+) PONV and history of anesthetic complications  Airway Mallampati: II   Neck ROM: Full    Dental   Molars chipped:   Pulmonary sleep apnea and Continuous Positive Airway Pressure Ventilation ,    Pulmonary exam normal breath sounds clear to auscultation       Cardiovascular Normal cardiovascular exam Rhythm:Regular Rate:Normal  Hx DVT 2013 postop  ECG 08/23/20: normal   Neuro/Psych  Headaches, PSYCHIATRIC DISORDERS Depression    GI/Hepatic GERD  ,  Endo/Other  Obesity   Renal/GU negative Renal ROS     Musculoskeletal   Abdominal   Peds  Hematology negative hematology ROS (+)   Anesthesia Other Findings   Reproductive/Obstetrics                             Anesthesia Physical Anesthesia Plan  ASA: 2  Anesthesia Plan: General   Post-op Pain Management:    Induction: Intravenous  PONV Risk Score and Plan: 4 or greater and Propofol infusion, TIVA and Treatment may vary due to age or medical condition  Airway Management Planned: Natural Airway  Additional Equipment:   Intra-op Plan:   Post-operative Plan:   Informed Consent: I have reviewed the patients History and Physical, chart, labs and discussed the procedure including the risks, benefits and alternatives for the proposed anesthesia with the patient or authorized representative who has indicated his/her understanding and acceptance.       Plan Discussed with: CRNA  Anesthesia Plan Comments: (LMA/GETA backup discussed.  Patient consented for risks of anesthesia including but not limited to:  - adverse reactions to medications - damage to eyes, teeth, lips or other oral mucosa - nerve damage due  to positioning  - sore throat or hoarseness - damage to heart, brain, nerves, lungs, other parts of body or loss of life  Informed patient about role of CRNA in peri- and intra-operative care.  Patient voiced understanding.)        Anesthesia Quick Evaluation

## 2021-05-26 ENCOUNTER — Encounter: Payer: Self-pay | Admitting: Gastroenterology

## 2021-06-12 ENCOUNTER — Encounter: Payer: Self-pay | Admitting: Family Medicine

## 2021-06-12 ENCOUNTER — Other Ambulatory Visit: Payer: Self-pay

## 2021-06-12 ENCOUNTER — Ambulatory Visit (INDEPENDENT_AMBULATORY_CARE_PROVIDER_SITE_OTHER): Payer: Managed Care, Other (non HMO) | Admitting: Family Medicine

## 2021-06-12 VITALS — BP 124/86 | HR 67 | Temp 97.7°F | Ht 64.0 in | Wt 234.5 lb

## 2021-06-12 DIAGNOSIS — J069 Acute upper respiratory infection, unspecified: Secondary | ICD-10-CM | POA: Diagnosis not present

## 2021-06-12 DIAGNOSIS — J0111 Acute recurrent frontal sinusitis: Secondary | ICD-10-CM

## 2021-06-12 LAB — POC COVID19 BINAXNOW: SARS Coronavirus 2 Ag: NEGATIVE

## 2021-06-12 MED ORDER — DOXYCYCLINE HYCLATE 100 MG PO TABS: 100.0000 mg | ORAL_TABLET | Freq: Two times a day (BID) | ORAL | 0 refills | Status: DC

## 2021-06-12 NOTE — Assessment & Plan Note (Signed)
Neg covid test today  ? ?S/s of sinusitis with allergies also  ?See a/p for sinusitis  ?

## 2021-06-12 NOTE — Patient Instructions (Addendum)
Drink fluids and rest  ?mucinex DM is good for cough and congestion  ?Nasal saline for congestion as needed  ?Tylenol for fever or pain or headache  ?Please alert [place] if symptoms worsen (if severe or short of breath please go to the ER)  ? ?Take doxycycline for sinus infection  ?Keep [place] posted  ? ?Update if not starting to improve in a week or if worsening   ? ? ?For allergy season:  ?Antihistamine - like claritin or zyrtec ?Steroid nasal spray- flonase or nasacort  ?

## 2021-06-12 NOTE — Progress Notes (Signed)
Subjective:    Patient ID: Kari Rice, female    DOB: [birth year], 46 y.o.   MRN: [id number]  This visit occurred during the SARS-CoV-2 public health emergency.  Safety protocols were in place, including screening questions prior to the visit, additional usage of staff PPE, and extensive cleaning of exam room while observing appropriate contact time as indicated for disinfecting solutions.   HPI Pt presents for sinus symptoms   Wt Readings from Last 3 Encounters:  06/12/21 234 lb 8 oz (106.4 kg)  05/23/21 232 lb (105.2 kg)  04/18/21 242 lb 2 oz (109.8 kg)   40.25 kg/m  Symptoms started Friday  Felt tired/chilled but no fever  Sat/Sunday- did not do a lot  Then some chest congestion    Now sinus congestion  Yellow mucous on Friday  Mild sore throat after talking  Ears are ok   Pain/pressure around eyes  Headaches  More than just a cold   Cough-not too bad  Little phlegm, not coming all the way up  Wheezed one am -none since    Sees Dr Tami Ribas - last was in May for sinusitis - put her on clindamycin? She has had sinus surgery in the past  She cannot take pcn or sulfa   Has not done a covid test so far    Otc:  Mucinex sinus max Has not used her saline recently   Results for orders placed or performed in visit on 06/12/21  POC COVID-19 BinaxNow  Result Value Ref Range   SARS Coronavirus 2 Ag Negative Negative     Review of Systems  Constitutional:  Negative for activity change, appetite change, fatigue, fever and unexpected weight change.  HENT:  Positive for postnasal drip, rhinorrhea, sinus pressure, sinus pain and sore throat. Negative for congestion, ear pain and voice change.   Eyes:  Negative for pain, redness and visual disturbance.  Respiratory:  Positive for cough. Negative for shortness of breath, wheezing and stridor.   Cardiovascular:  Negative for chest pain and palpitations.  Gastrointestinal:  Negative for abdominal pain, blood in stool,  constipation and diarrhea.  Endocrine: Negative for polydipsia and polyuria.  Genitourinary:  Negative for dysuria, frequency and urgency.  Musculoskeletal:  Negative for arthralgias, back pain and myalgias.  Skin:  Negative for pallor and rash.  Allergic/Immunologic: Negative for environmental allergies.  Neurological:  Negative for dizziness, syncope and headaches.  Hematological:  Negative for adenopathy. Does not bruise/bleed easily.  Psychiatric/Behavioral:  Negative for decreased concentration and dysphoric mood. The patient is not nervous/anxious.      Patient Active Problem List   Diagnosis Date Noted   URI with cough and congestion 06/12/2021   Sigmoid diverticulosis    Colon cancer screening 04/18/2021   Ear discomfort, right 04/18/2021   Dizziness 08/28/2020   Sinusitis 08/28/2020   Dysthymia 12/26/2019   Fibroid uterus 12/26/2019   Right leg swelling 12/11/2019   Fungal nail infection 10/11/2019   Contraception management 10/11/2019   Great toe pain, right 12/05/2018   Left anterior knee pain 04/26/2018   Injury of left toe 01/25/2018   Left hand paresthesia 11/22/2017   GERD (gastroesophageal reflux disease) 11/22/2017   H/O vaginitis 08/10/2017   Bell's palsy 07/07/2017   Headache 07/07/2017   Hair loss 03/25/2016   Vitamin D deficiency 01/15/2016   Routine general medical examination at a health care facility 05/29/2014   Encounter for routine gynecological examination 05/29/2014   Screening for HIV (human immunodeficiency virus) 05/29/2014  Encounter for screening mammogram for breast cancer 05/29/2014   Joint pain 04/10/2014   History of DVT (deep vein thrombosis) 07/06/2012   Varicose veins of lower extremities with other complications 23/30/0762   Venous (peripheral) insufficiency 12/23/2011   Phlebitis and thrombophlebitis of the leg 11/06/2011   Venous insufficiency, peripheral 10/26/2011   Pedal edema 10/26/2011   IRREGULAR MENSES 02/29/2008    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 2006   Gall Bladder   COLONOSCOPY WITH PROPOFOL N/A 05/23/2021   Procedure: COLONOSCOPY WITH PROPOFOL;  Surgeon: Lin Landsman, MD;  [facility];  Service: Gastroenterology;  Laterality: N/A;   HAND SURGERY  2016   NASAL SEPTOPLASTY W/ TURBINOPLASTY Bilateral 10/01/2017   Procedure: NASAL SEPTOPLASTY WITH SUBMUCOCELE RESECTION OF TURBINATE;  Surgeon: Beverly Gust, MD;  Location: [facility];  Service: ENT;  Laterality: Bilateral;  Sleep apnea   ROBOTIC ASSISTED LAPAROSCOPIC HYSTERECTOMY AND SALPINGECTOMY Bilateral 03/13/2020   Procedure: XI ROBOTIC ASSISTED LAPAROSCOPIC HYSTERECTOMY AND SALPINGECTOMY;  Surgeon: Homero Fellers, MD;  Location: [facility] ORS;  Service: Gynecology;  Laterality: Bilateral;   THROMBECTOMY Right 01/13/12 and  01/21/12   Right iliac vein stent and Vein thrombosis   Social History   Tobacco Use   Smoking status: Never   Smokeless tobacco: Never  Vaping Use   Vaping Use: Never used  Substance Use Topics   Alcohol use: No    Alcohol/week: 0.0 standard drinks   Drug use: No   Family History  Problem Relation Age of Onset   Arthritis Mother    Cancer Mother        uterine    Heart disease Father        Heart Disease before age 16   Cancer Father        skin    Hypertension Father    Heart attack Father    Allergies  Allergen Reactions   Penicillins Rash   Sulfa Antibiotics Rash   Current Outpatient Medications on File Prior to Visit  Medication Sig Dispense Refill   acetaminophen (TYLENOL) 650 MG CR tablet Take 1,300 mg by mouth every 8 (eight)  hours as needed for pain.     Cholecalciferol (VITAMIN D3) 2000 units TABS Take 1 tablet by mouth daily.     FLUoxetine (PROZAC) 20 MG capsule Take 1 capsule (20 mg total) by mouth daily. 90 capsule 3   NON FORMULARY Support hose to the waist for dx of venous insufficiency and edema 15-20 mm Hg     omeprazole (PRILOSEC) 20 MG capsule Take 20 mg by mouth daily.     No current facility-administered medications on file prior to visit.    Objective:   Physical Exam Constitutional:      General: She is not in acute distress.    Appearance: Normal appearance. She is well-developed. She is obese. She is not ill-appearing.  HENT:     Head: Normocephalic and atraumatic.     Comments: Nares are injected and congested  Clear pnd   Bilateral maxillary sinus tenderness     Right Ear: Tympanic membrane and external ear  normal.     Left Ear: Tympanic membrane, ear canal and external ear normal.     Nose: Congestion and rhinorrhea present.     Mouth/Throat:     Mouth: Mucous membranes are moist.     Pharynx: No oropharyngeal exudate or posterior oropharyngeal erythema.     Comments: Clear pnd Eyes:     General:        Right eye: No discharge.        Left eye: No discharge.     Conjunctiva/sclera: Conjunctivae normal.     Pupils: Pupils are equal, round, and reactive to light.  Cardiovascular:     Rate and Rhythm: Normal rate and regular rhythm.  Pulmonary:     Effort: Pulmonary effort is normal. No respiratory distress.     Breath sounds: Normal breath sounds. No stridor. No wheezing, rhonchi or rales.  Musculoskeletal:     Cervical back: Normal range of motion and neck supple.  Lymphadenopathy:     Cervical: No cervical adenopathy.  Skin:    General: Skin is warm and dry.     Findings: No rash.  Neurological:     Mental Status: She is alert.     Cranial Nerves: No cranial nerve deficit.  Psychiatric:        Mood and Affect: Mood normal.          Assessment & Plan:   Problem  List Items Addressed This Visit       Respiratory   Sinusitis - Primary    Recurrent, now with some chronic allergy nasal symptoms tx with doxycycline Symptom care Saline irrigation  Plan to get back on antihistamine and steroid ns for allergy season  Update if not starting to improve in a week or if worsening        Relevant Medications   doxycycline (VIBRA-TABS) 100 MG tablet   URI with cough and congestion    Neg covid test today   S/s of sinusitis with allergies also  See a/p for sinusitis       Relevant Orders   POC COVID-19 BinaxNow (Completed)

## 2021-06-12 NOTE — Assessment & Plan Note (Signed)
Recurrent, now with some chronic allergy nasal symptoms ?tx with doxycycline ?Symptom care ?Saline irrigation ? ?Plan to get back on antihistamine and steroid ns for allergy season  ?Update if not starting to improve in a week or if worsening   ?

## 2021-07-07 ENCOUNTER — Ambulatory Visit (INDEPENDENT_AMBULATORY_CARE_PROVIDER_SITE_OTHER): Payer: Managed Care, Other (non HMO) | Admitting: Family Medicine

## 2021-07-07 ENCOUNTER — Other Ambulatory Visit: Payer: Self-pay

## 2021-07-07 ENCOUNTER — Encounter: Payer: Self-pay | Admitting: Family Medicine

## 2021-07-07 VITALS — BP 104/68 | HR 65 | Temp 97.4°F | Ht 64.0 in | Wt 232.0 lb

## 2021-07-07 DIAGNOSIS — J0111 Acute recurrent frontal sinusitis: Secondary | ICD-10-CM

## 2021-07-07 MED ORDER — PREDNISONE 10 MG PO TABS: ORAL_TABLET | ORAL | 0 refills | Status: DC

## 2021-07-07 MED ORDER — DOXYCYCLINE HYCLATE 100 MG PO TABS: 100.0000 mg | ORAL_TABLET | Freq: Two times a day (BID) | ORAL | 0 refills | Status: DC

## 2021-07-07 NOTE — Patient Instructions (Signed)
Drink fluids  ?Rest when you can ?Take doxycycline for sinus infection  ?Prednisone taper- may make you hyper and hungry  ? ? ?Use the saline irrigation  ? ?Update if not starting to improve in a week or if worsening  ? ? ?Avoid allergens if/when you can ? ? ?

## 2021-07-07 NOTE — Assessment & Plan Note (Signed)
Recurrent  ?L frontal today  ?S/p 1 wk of uri and also some seasonal allergic rhinitis ? ?tx with doxycycline  ?Fluids/saline/allergy treatment  ?Px 30 mg prednisone taper (reviewed side effect)  ?Update if not starting to improve in a week or if worsening   ?

## 2021-07-07 NOTE — Progress Notes (Signed)
? ?Subjective:  ? ? Patient ID: Kari Rice, female    DOB: 07-Apr-1976, 46 y.o.   MRN: [id number] ? ?This visit occurred during the SARS-CoV-2 public health emergency.  Safety protocols were in place, including screening questions prior to the visit, additional usage of staff PPE, and extensive cleaning of exam room while observing appropriate contact time as indicated for disinfecting solutions.  ? ?HPI ?Pt presents for sinus symptoms  ? ?Wt Readings from Last 3 Encounters:  ?07/07/21 232 lb (105.2 kg)  ?06/12/21 234 lb 8 oz (106.4 kg)  ?05/23/21 232 lb (105.2 kg)  ? ?39.82 kg/m? ? ?Started 1 week ago  ? ?Congestion with pnd  (both sides of head but worse on L behind eyes) ?Headache  ?Yellow mucous /cannot get much out  ?Started with sneezing but that stopped  ? ?No fever  ?No body aches  ? ?A little cough first thing in am  ?No production ?No wheeze   ? ?Did not do covid testing  ?H/o sinus surgery in past/recurrent sinusitis  ? ? ?Had acute sinusitis early march tx with doxy ?Did get completely better  ?May have caught this from a coworker  ? ? ?Otc ?Mucinex ?Dayquil  ?Sinus irrigation  ?Claritin  ? ?Patient Active Problem List  ? Diagnosis Date Noted  ? URI with cough and congestion 06/12/2021  ? Sigmoid diverticulosis   ? Colon cancer screening 04/18/2021  ? Ear discomfort, right 04/18/2021  ? Dizziness 08/28/2020  ? Sinusitis 08/28/2020  ? Dysthymia 12/26/2019  ? Fibroid uterus 12/26/2019  ? Right leg swelling 12/11/2019  ? Fungal nail infection 10/11/2019  ? Contraception management 10/11/2019  ? Great toe pain, right 12/05/2018  ? Left anterior knee pain 04/26/2018  ? Injury of left toe 01/25/2018  ? Left hand paresthesia 11/22/2017  ? GERD (gastroesophageal reflux disease) 11/22/2017  ? H/O vaginitis 08/10/2017  ? Bell's palsy 07/07/2017  ? Headache 07/07/2017  ? Hair loss 03/25/2016  ? Vitamin D deficiency 01/15/2016  ? Routine general medical examination at a health care facility 05/29/2014  ?  Encounter for routine gynecological examination 05/29/2014  ? Screening for HIV (human immunodeficiency virus) 05/29/2014  ? Encounter for screening mammogram for breast cancer 05/29/2014  ? Joint pain 04/10/2014  ? History of DVT (deep vein thrombosis) 07/06/2012  ? Varicose veins of lower extremities with other complications 46/50/3546  ? Venous (peripheral) insufficiency 12/23/2011  ? Phlebitis and thrombophlebitis of the leg 11/06/2011  ? Venous insufficiency, peripheral 10/26/2011  ? Pedal edema 10/26/2011  ? IRREGULAR MENSES 02/29/2008  ? 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 ?Past Medical History:  ?Diagnosis Date  ? Arthritis   ? right hand  ? DVT (deep venous thrombosis) ([place]) 2013  ? GERD (gastroesophageal reflux disease)   ? PONV (postoperative nausea and vomiting)   ? after cholecystectomy  ? Sleep apnea   ? CPAP  ? Varicose veins   ? ?Past Surgical History:  ?Procedure Laterality Date  ? CHOLECYSTECTOMY  Feb. 2006  ? Gall Bladder  ? COLONOSCOPY WITH PROPOFOL N/A 05/23/2021  ? Procedure: COLONOSCOPY WITH PROPOFOL;  Surgeon: Lin Landsman, MD;  Location: [facility] ENDOSCOPY;  Service: Gastroenterology;  Laterality: N/A;  ? HAND SURGERY  2016  ? NASAL SEPTOPLASTY W/ TURBINOPLASTY Bilateral 10/01/2017  ? Procedure: NASAL SEPTOPLASTY WITH SUBMUCOCELE RESECTION OF TURBINATE;  Surgeon: Beverly Gust, MD;  Location: [facility];  Service: ENT;  Laterality: Bilateral;  Sleep apnea  ? ROBOTIC ASSISTED LAPAROSCOPIC HYSTERECTOMY AND SALPINGECTOMY Bilateral 03/13/2020  ?  Procedure: XI ROBOTIC ASSISTED LAPAROSCOPIC HYSTERECTOMY AND SALPINGECTOMY;  Surgeon: Homero Fellers, MD;  Location: [facility] ORS;  Service: Gynecology;  Laterality: Bilateral;  ? THROMBECTOMY Right 01/13/12 and  01/21/12  ? Right iliac vein stent and Vein thrombosis  ? ?Social History  ? ?Tobacco Use  ? Smoking status: Never  ? Smokeless tobacco: Never  ?Vaping Use  ? Vaping Use: Never used  ?Substance Use Topics  ? Alcohol use: No  ?   Alcohol/week: 0.0 standard drinks  ? Drug use: No  ? ?Family History  ?Problem Relation Age of Onset  ? Arthritis Mother   ? Cancer Mother   ?     uterine   ? Heart disease Father   ?     Heart Disease before age 75  ? Cancer Father   ?     skin   ? Hypertension Father   ? Heart attack Father   ? ?Allergies  ?Allergen Reactions  ? Penicillins Rash  ? Sulfa Antibiotics Rash  ? ?Current Outpatient Medications on File Prior to Visit  ?Medication Sig Dispense Refill  ? acetaminophen (TYLENOL) 650 MG CR tablet Take 1,300 mg by mouth every 8 (eight) hours as needed for pain.    ? Cholecalciferol (VITAMIN D3) 2000 units TABS Take 1 tablet by mouth daily.    ? FLUoxetine (PROZAC) 20 MG capsule Take 1 capsule (20 mg total) by mouth daily. 90 capsule 3  ? NON FORMULARY Support hose to the waist for dx of venous insufficiency and edema ?15-20 mm Hg    ? omeprazole (PRILOSEC) 20 MG capsule Take 20 mg by mouth daily.    ? ?No current facility-administered medications on file prior to visit.  ?  ?Review of Systems  ?Constitutional:  Negative for activity change, appetite change, fatigue, fever and unexpected weight change.  ?HENT:  Positive for congestion, postnasal drip, rhinorrhea, sinus pressure and sinus pain. Negative for ear pain, sore throat and voice change.   ?Eyes:  Negative for pain, redness and visual disturbance.  ?Respiratory:  Positive for cough. Negative for shortness of breath and wheezing.   ?Cardiovascular:  Negative for chest pain and palpitations.  ?Gastrointestinal:  Negative for abdominal pain, blood in stool, constipation and diarrhea.  ?Endocrine: Negative for polydipsia and polyuria.  ?Genitourinary:  Negative for dysuria, frequency and urgency.  ?Musculoskeletal:  Negative for arthralgias, back pain and myalgias.  ?Skin:  Negative for pallor and rash.  ?Allergic/Immunologic: Negative for environmental allergies.  ?Neurological:  Positive for headaches. Negative for dizziness and syncope.   ?Hematological:  Negative for adenopathy. Does not bruise/bleed easily.  ?Psychiatric/Behavioral:  Negative for decreased concentration and dysphoric mood. The patient is not nervous/anxious.   ? ?   ?Objective:  ? Physical Exam ?Constitutional:   ?   General: She is not in acute distress. ?   Appearance: Normal appearance. She is well-developed. She is obese. She is not ill-appearing.  ?HENT:  ?   Head: Normocephalic and atraumatic.  ?   Comments: Bilateral  frontal sinus tenderness, worse on the left ?   Right Ear: Tympanic membrane, ear canal and external ear normal.  ?   Left Ear: Tympanic membrane, ear canal and external ear normal.  ?   Nose: Congestion and rhinorrhea present.  ?   Mouth/Throat:  ?   Pharynx: Oropharynx is clear. No oropharyngeal exudate or posterior oropharyngeal erythema.  ?   Comments: Clear pnd ?Eyes:  ?   General:     ?  Right eye: No discharge.     ?   Left eye: No discharge.  ?   Conjunctiva/sclera: Conjunctivae normal.  ?   Pupils: Pupils are equal, round, and reactive to light.  ?Cardiovascular:  ?   Rate and Rhythm: Normal rate and regular rhythm.  ?Pulmonary:  ?   Effort: Pulmonary effort is normal. No respiratory distress.  ?   Breath sounds: Normal breath sounds. No wheezing or rales.  ?   Comments: Good air exch ?No rales or rhonchi ?Musculoskeletal:  ?   Cervical back: Normal range of motion and neck supple.  ?Lymphadenopathy:  ?   Cervical: No cervical adenopathy.  ?Skin: ?   General: Skin is warm and dry.  ?   Findings: No rash.  ?Neurological:  ?   Mental Status: She is alert.  ?   Cranial Nerves: No cranial nerve deficit.  ?   Coordination: Coordination normal.  ?Psychiatric:     ?   Mood and Affect: Mood normal.  ? ? ? ? ? ?   ?Assessment & Plan:  ? ?Problem List Items Addressed This Visit   ? ?  ? Respiratory  ? Sinusitis - Primary  ?  Recurrent  ?L frontal today  ?S/p 1 wk of uri and also some seasonal allergic rhinitis ? ?tx with doxycycline  ?Fluids/saline/allergy  treatment  ?Px 30 mg prednisone taper (reviewed side effect)  ?Update if not starting to improve in a week or if worsening   ?  ?  ? Relevant Medications  ? doxycycline (VIBRA-TABS) 100 MG tablet  ? predniSONE (DELTASONE) 10 MG t

## 2021-10-09 IMAGING — US US EXTREM LOW VENOUS*R*
1 series · 13 of 24 positions shown · non-contrast
Comparison: None.

CLINICAL DATA: Right lower extremity pain, edema and phlebitis.
History of prior right lower extremity DVT and status post
thrombolytic therapy with iliac vein stenting in 6668.



[Series 1: us extrem low venous*right* · 0.08mm/px · 45 acquisitions, 13 frames shown]
[im 1/45]
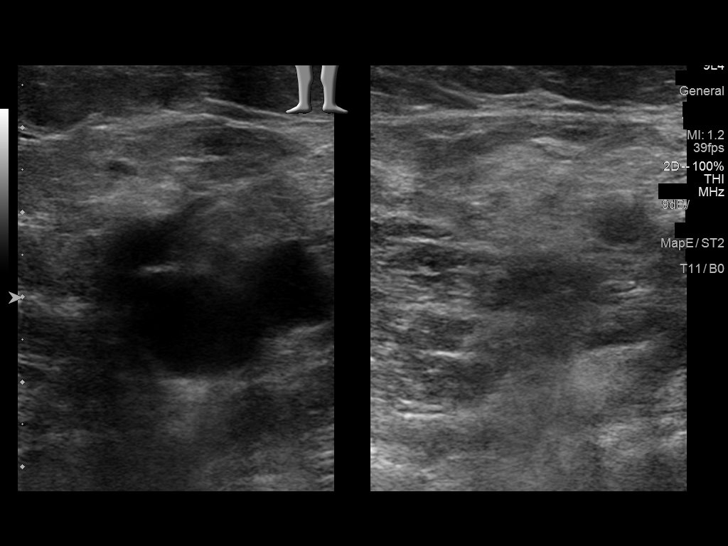
[im 4/45]
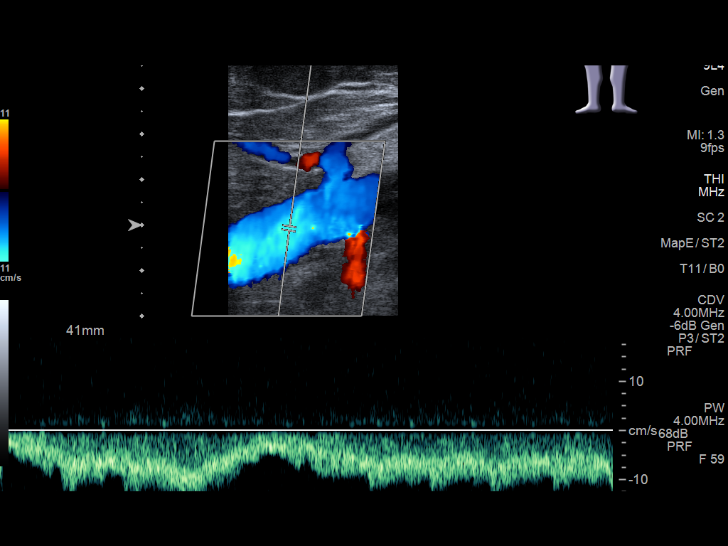
[im 8/45]
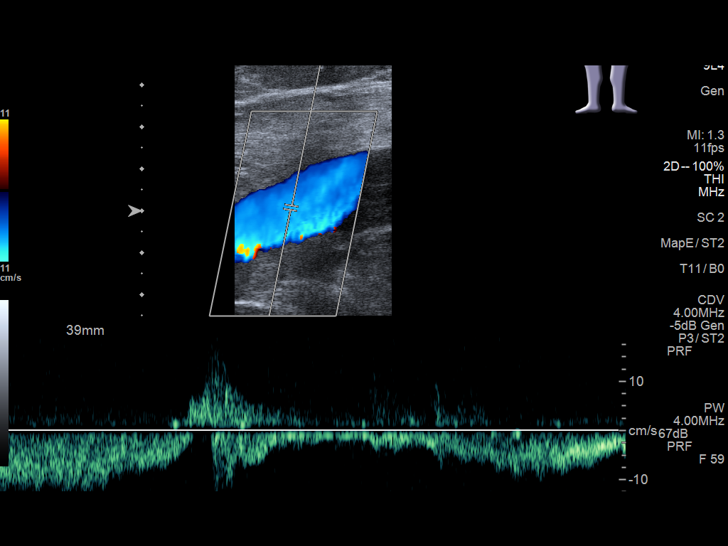
[im 12/45]
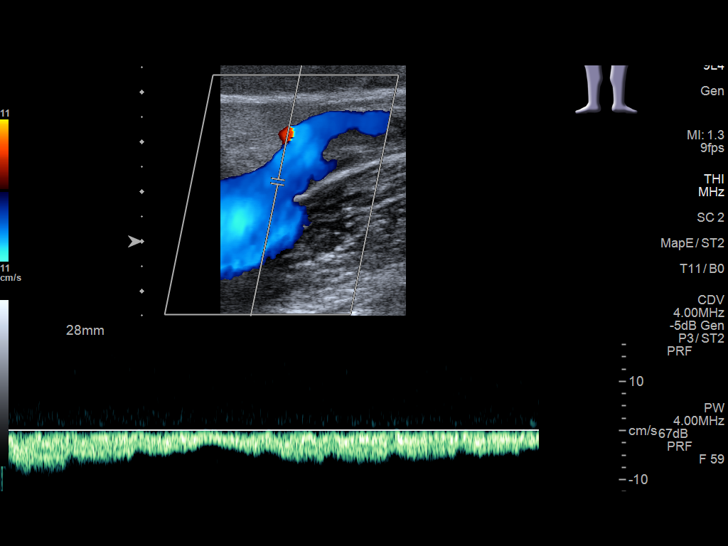
[im 16/45]
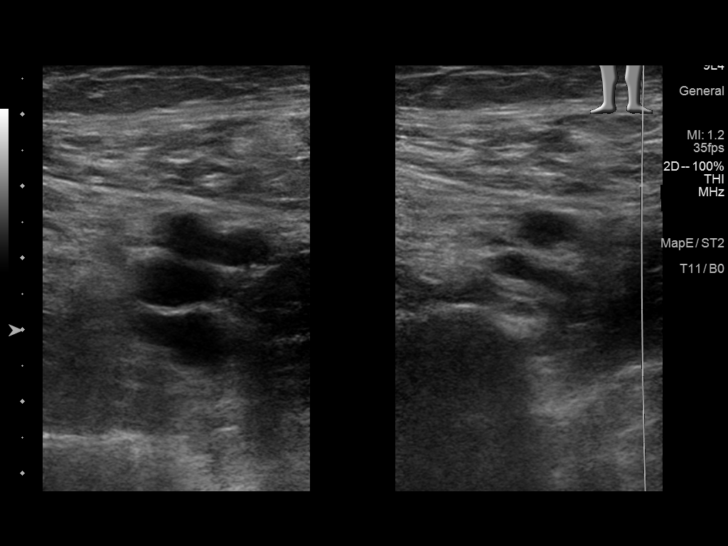
[im 20/45]
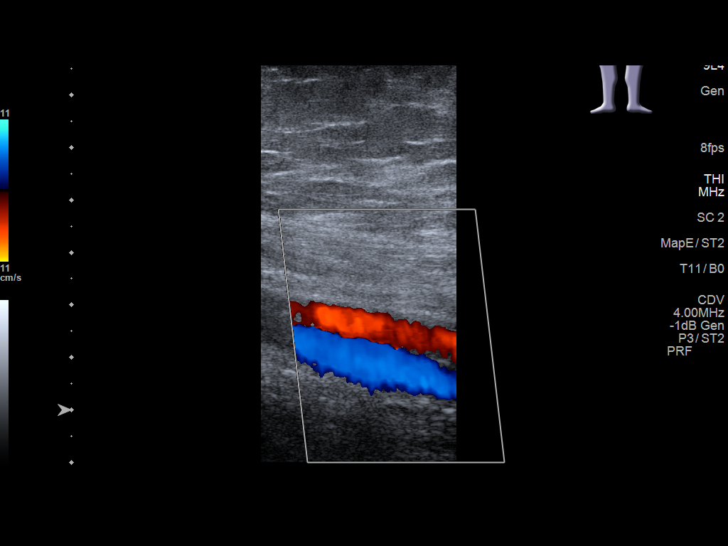
[im 25/45]
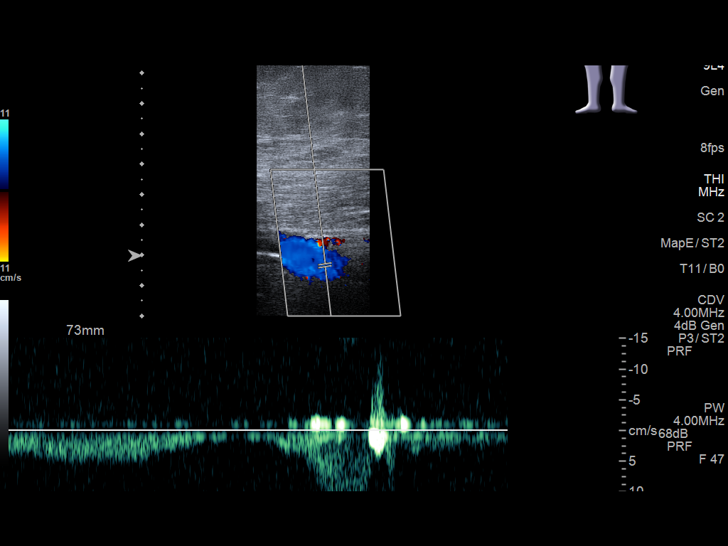
[im 27/45]
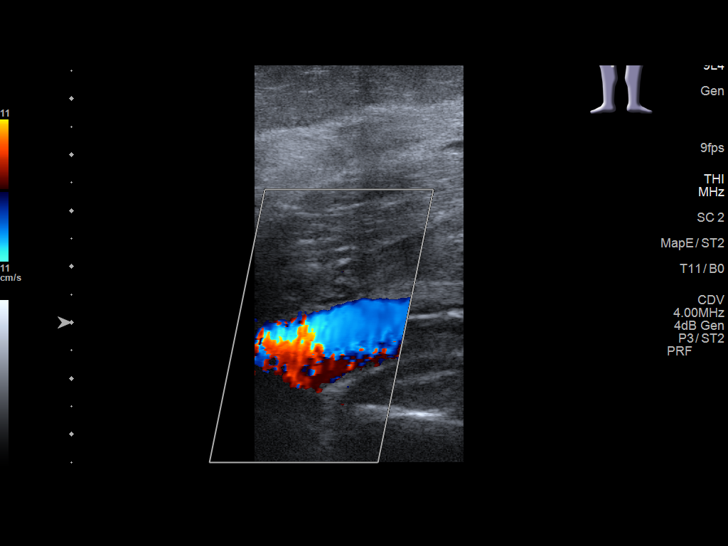
[im 31/45]
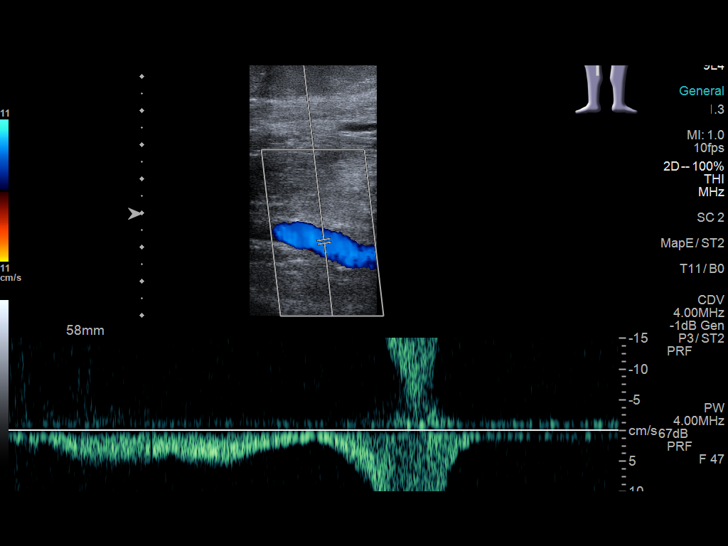
[im 35/45]
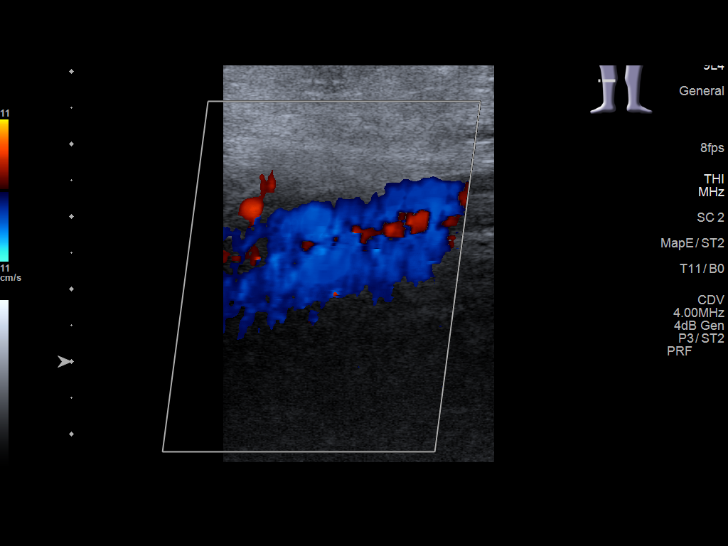
[im 39/45]
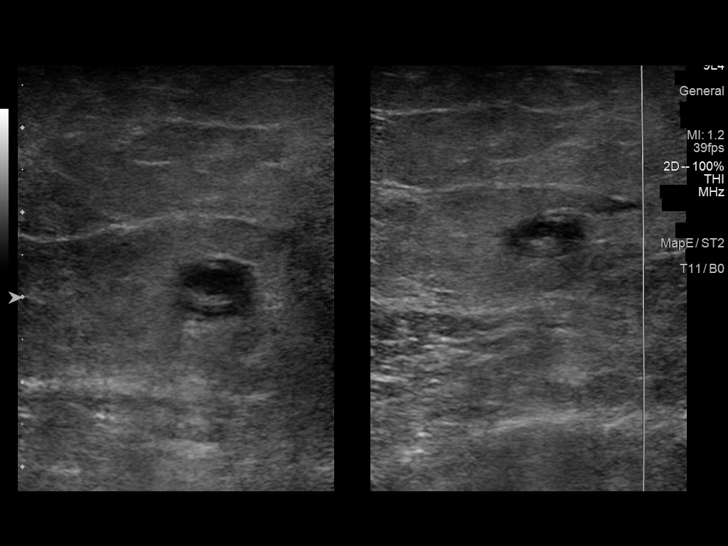
[im 41/45]
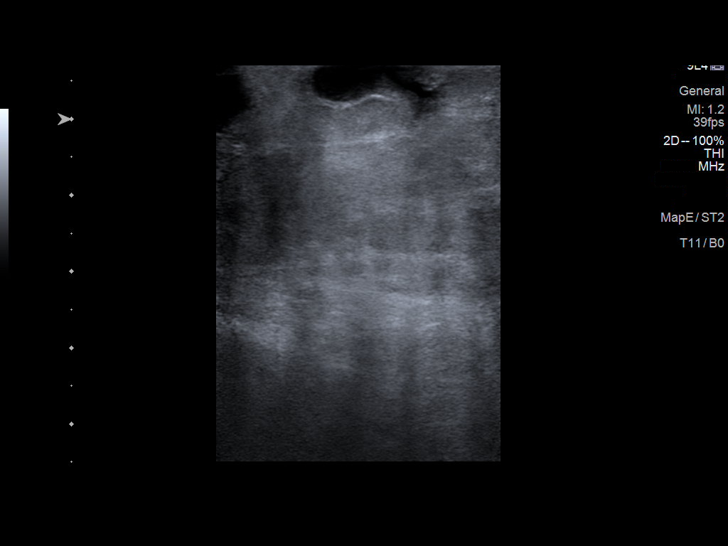
[im 45/45]
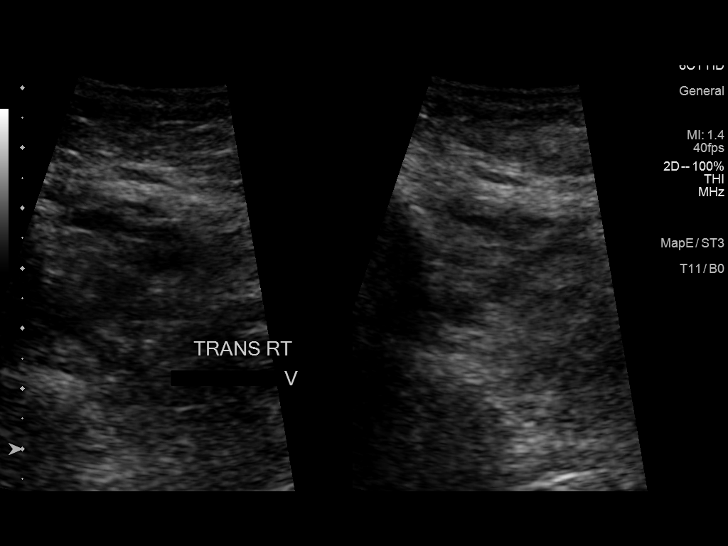

[13 of 24 positions shown; findings below may reference images not displayed]

FINDINGS: Contralateral Common Femoral Vein: Respiratory phasicity is normal
and symmetric with the symptomatic side. No evidence of thrombus.
Normal compressibility.

Common Femoral Vein: No evidence of thrombus. Normal
compressibility, respiratory phasicity and response to augmentation.

Saphenofemoral Junction: No evidence of thrombus. Normal
compressibility and flow on color Doppler imaging.

Profunda Femoral Vein: No evidence of thrombus. Normal
compressibility and flow on color Doppler imaging.

Femoral Vein: No evidence of thrombus. Normal compressibility,
respiratory phasicity and response to augmentation.

Popliteal Vein: No evidence of thrombus. Normal compressibility,
respiratory phasicity and response to augmentation.

Calf Veins: No evidence of thrombus. Normal compressibility and flow
on color Doppler imaging.

Superficial Great Saphenous Vein: Thrombus is seen throughout most
of the great saphenous vein in the right thigh with the vein
demonstrating partial compressibility.

Venous Reflux:  None.

Other Findings: Multiple prominent right medial calf varicosities
demonstrate thrombosis.
IMPRESSION: Evidence of superficial thrombophlebitis involving the right great
saphenous vein as well as multiple communicating right calf
varicosities. No evidence of deep vein thrombosis.

## 2021-12-03 ENCOUNTER — Ambulatory Visit: Payer: Managed Care, Other (non HMO) | Admitting: Family Medicine

## 2021-12-03 ENCOUNTER — Encounter: Payer: Self-pay | Admitting: Family Medicine

## 2021-12-03 VITALS — BP 108/66 | HR 89 | Ht 64.0 in | Wt 235.2 lb

## 2021-12-03 DIAGNOSIS — I803 Phlebitis and thrombophlebitis of lower extremities, unspecified: Secondary | ICD-10-CM | POA: Diagnosis not present

## 2021-12-03 DIAGNOSIS — I872 Venous insufficiency (chronic) (peripheral): Secondary | ICD-10-CM | POA: Diagnosis not present

## 2021-12-03 DIAGNOSIS — G4733 Obstructive sleep apnea (adult) (pediatric): Secondary | ICD-10-CM

## 2021-12-03 NOTE — Patient Instructions (Addendum)
Regular exercise helps sleep and energy level  Try to add back 30 minutes per day any way you can  Indoors or out   Do not exercise right before bed   Wear support stockings  Get the px filled  See if this helps  Elevate feet when you sit   If more painful or red let us know   I place a pulmonary referral  If you don't get a call in 1-2 week - go ahead and call [facility] in [place] or call us

## 2021-12-03 NOTE — Assessment & Plan Note (Signed)
More swelling on lateral R leg today (superficial erythema) Non tender - doubt cellulitis supp hose may not be working as well/ needs hose to the waist Px 15-20 mm Hg hose to waist- she will fill at med supply  Disc s/s of phlebitis Update if not starting to improve in a week or if worsening

## 2021-12-03 NOTE — Assessment & Plan Note (Signed)
More sleepy lately and machine is old May need new pressures with wt change as well  Ref to pulmonary for f/u , will likely need a new sleep study as well Enc regular exercise and wt loss

## 2021-12-03 NOTE — Progress Notes (Signed)
Subjective:    Patient ID: Kari Rice, female    DOB: December 07, 1975, 46 y.o.   MRN: [id number]  HPI Pt presents with fatigue /sleepiness Also vein problem/phlebitis   Wt Readings from Last 3 Encounters:  12/03/21 235 lb 3.2 oz (106.7 kg)  07/07/21 232 lb (105.2 kg)  06/12/21 234 lb 8 oz (106.4 kg)   40.37 kg/m  Feeling more tired  Like she was before her cpap   Lately is kicking into higher pressure more  Water level seems to be going down recently  -? If it is working differently  Is 42-42 years old now   Sees Dr Mortimer Fries at [facility] for pulmonary   Her cat wakes her up at 4 am but she goes back to sleep  Sleeping more fitfully now  Blankets are all over the place  Dreaming about the same   Falls asleep right away  Fights nodding off during the day now  1 c caffeine before work daily (coffee)   Still taking prozac for mood  It does help  Mood is pretty good lately overall   Exercise - regular activity but no program Needs to do more   Taking vit D Also vit B complex vitamin   R leg /veins are bothering her more  Her supp hose go to the knee then she swells above it   Patient Active Problem List   Diagnosis Date Noted   OSA (obstructive sleep apnea) 12/03/2021   Sigmoid diverticulosis    Colon cancer screening 04/18/2021   Ear discomfort, right 04/18/2021   Dizziness 08/28/2020   Dysthymia 12/26/2019   Fibroid uterus 12/26/2019   Right leg swelling 12/11/2019   Fungal nail infection 10/11/2019   Contraception management 10/11/2019   Great toe pain, right 12/05/2018   Left anterior knee pain 04/26/2018   Injury of left toe 01/25/2018   Left hand paresthesia 11/22/2017   GERD (gastroesophageal reflux disease) 11/22/2017   H/O vaginitis 08/10/2017   Bell's palsy 07/07/2017   Headache 07/07/2017   Hair loss 03/25/2016   Vitamin D deficiency 01/15/2016   Routine general medical examination at a health care facility 05/29/2014   Encounter for routine  gynecological examination 05/29/2014   Screening for HIV (human immunodeficiency virus) 05/29/2014   Encounter for screening mammogram for breast cancer 05/29/2014   Joint pain 04/10/2014   History of DVT (deep vein thrombosis) 07/06/2012   Varicose veins of lower extremities with other complications 12/87/8676   Venous (peripheral) insufficiency 12/23/2011   Phlebitis and thrombophlebitis of the leg 11/06/2011   Venous insufficiency, peripheral 10/26/2011   Pedal edema 10/26/2011   IRREGULAR MENSES 02/29/2008   TRICHOMONAL VAGINITIS 03/18/2007   Past Medical History:  Diagnosis Date   Arthritis    right hand   DVT (deep venous thrombosis) ([place]) 2013   GERD (gastroesophageal reflux disease)    PONV (postoperative nausea and vomiting)    after cholecystectomy   Sleep apnea    CPAP   Varicose veins    Past Surgical History:  Procedure Laterality Date   CHOLECYSTECTOMY  Feb. 2006   Gall Bladder   COLONOSCOPY WITH PROPOFOL N/A 05/23/2021   Procedure: COLONOSCOPY WITH PROPOFOL;  Surgeon: Lin Landsman, MD;  Location: [facility];  Service: Gastroenterology;  Laterality: N/A;   HAND SURGERY  2016   NASAL SEPTOPLASTY W/ TURBINOPLASTY Bilateral 10/01/2017   Procedure: NASAL SEPTOPLASTY WITH SUBMUCOCELE RESECTION OF TURBINATE;  Surgeon: Beverly Gust, MD;  Location: [facility];  Service:  ENT;  Laterality: Bilateral;  Sleep apnea   ROBOTIC ASSISTED LAPAROSCOPIC HYSTERECTOMY AND SALPINGECTOMY Bilateral 03/13/2020   Procedure: XI ROBOTIC ASSISTED LAPAROSCOPIC HYSTERECTOMY AND SALPINGECTOMY;  Surgeon: Homero Fellers, MD;  Location: [facility] ORS;  Service: Gynecology;  Laterality: Bilateral;   THROMBECTOMY Right 01/13/12 and  01/21/12   Right iliac vein stent and Vein thrombosis   Social History   Tobacco Use   Smoking status: Never   Smokeless tobacco: Never  Vaping Use   Vaping Use: Never used  Substance Use Topics   Alcohol use: No    Alcohol/week: 0.0  standard drinks of alcohol   Drug use: No   Family History  Problem Relation Age of Onset   Arthritis Mother    Cancer Mother        uterine    Heart disease Father        Heart Disease before age 78   Cancer Father        skin    Hypertension Father    Heart attack Father    Allergies  Allergen Reactions   Penicillins Rash   Sulfa Antibiotics Rash   Current Outpatient Medications on File Prior to Visit  Medication Sig Dispense Refill   acetaminophen (TYLENOL) 650 MG CR tablet Take 1,300 mg by mouth every 8 (eight) hours as needed for pain.     Cholecalciferol (VITAMIN D3) 2000 units TABS Take 1 tablet by mouth daily.     FLUoxetine (PROZAC) 20 MG capsule Take 1 capsule (20 mg total) by mouth daily. 90 capsule 3   NON FORMULARY Support hose to the waist for dx of venous insufficiency and edema 15-20 mm Hg     omeprazole (PRILOSEC) 20 MG capsule Take 20 mg by mouth daily.     No current facility-administered medications on file prior to visit.    Review of Systems  Constitutional:  Positive for fatigue. Negative for activity change, appetite change, fever and unexpected weight change.  HENT:  Negative for congestion, ear pain, rhinorrhea, sinus pressure and sore throat.   Eyes:  Negative for pain, redness and visual disturbance.  Respiratory:  Negative for cough, shortness of breath and wheezing.   Cardiovascular:  Positive for leg swelling. Negative for chest pain and palpitations.  Gastrointestinal:  Negative for abdominal pain, blood in stool, constipation and diarrhea.  Endocrine: Negative for polydipsia and polyuria.  Genitourinary:  Negative for dysuria, frequency and urgency.  Musculoskeletal:  Negative for arthralgias, back pain and myalgias.  Skin:  Negative for pallor and rash.  Allergic/Immunologic: Negative for environmental allergies.  Neurological:  Negative for dizziness, syncope and headaches.  Hematological:  Negative for adenopathy. Does not bruise/bleed  easily.  Psychiatric/Behavioral:  Positive for sleep disturbance. Negative for decreased concentration and dysphoric mood. The patient is not nervous/anxious.        Objective:   Physical Exam Constitutional:      General: She is not in acute distress.    Appearance: Normal appearance. She is well-developed. She is obese. She is not ill-appearing.  HENT:     Head: Normocephalic and atraumatic.  Eyes:     General: No scleral icterus.    Conjunctiva/sclera: Conjunctivae normal.     Pupils: Pupils are equal, round, and reactive to light.  Neck:     Thyroid: No thyromegaly.     Vascular: No carotid bruit or JVD.  Cardiovascular:     Rate and Rhythm: Normal rate and regular rhythm.     Pulses: Normal pulses.  Heart sounds: Normal heart sounds.     No gallop.  Pulmonary:     Effort: Pulmonary effort is normal. No respiratory distress.     Breath sounds: Normal breath sounds. No stridor. No wheezing, rhonchi or rales.     Comments: Good air exch  Abdominal:     General: There is no distension or abdominal bruit.     Palpations: Abdomen is soft.  Musculoskeletal:     Cervical back: Normal range of motion and neck supple.     Right lower leg: Edema present.     Left lower leg: No edema.     Comments: Baseline scarring/erythema on R medial LE from past phlebitis  Some compressible veins More superficial redness /swelling laterally today   No palp cords or deep swelling  Neg homan's sign     Lymphadenopathy:     Cervical: No cervical adenopathy.  Skin:    General: Skin is warm and dry.     Coloration: Skin is not pale.     Findings: No rash.  Neurological:     Mental Status: She is alert.     Coordination: Coordination normal.     Deep Tendon Reflexes: Reflexes are normal and symmetric. Reflexes normal.  Psychiatric:        Mood and Affect: Mood normal.           Assessment & Plan:   Problem List Items Addressed This Visit       Cardiovascular and  Mediastinum   Phlebitis and thrombophlebitis of the leg    More swelling on lateral R leg today (superficial erythema) Non tender - doubt cellulitis supp hose may not be working as well/ needs hose to the waist Px 15-20 mm Hg hose to waist- she will fill at med supply  Disc s/s of phlebitis Update if not starting to improve in a week or if worsening        Relevant Orders   For home use only DME Other see comment   Venous (peripheral) insufficiency   Relevant Orders   For home use only DME Other see comment     Respiratory   OSA (obstructive sleep apnea) - Primary    More sleepy lately and machine is old May need new pressures with wt change as well  Ref to pulmonary for f/u , will likely need a new sleep study as well Enc regular exercise and wt loss       Relevant Orders   Ambulatory referral to Pulmonology

## 2021-12-12 ENCOUNTER — Telehealth: Payer: Self-pay

## 2021-12-12 NOTE — Telephone Encounter (Signed)
Lm for patient to ask if she has had previous sleep study.  

## 2021-12-16 ENCOUNTER — Ambulatory Visit (INDEPENDENT_AMBULATORY_CARE_PROVIDER_SITE_OTHER): Payer: Managed Care, Other (non HMO) | Admitting: Adult Health

## 2021-12-16 ENCOUNTER — Encounter: Payer: Self-pay | Admitting: Adult Health

## 2021-12-16 DIAGNOSIS — G4733 Obstructive sleep apnea (adult) (pediatric): Secondary | ICD-10-CM

## 2021-12-16 DIAGNOSIS — E669 Obesity, unspecified: Secondary | ICD-10-CM | POA: Insufficient documentation

## 2021-12-16 NOTE — Assessment & Plan Note (Signed)
Encouraged on healthy weight loss 

## 2021-12-16 NOTE — Patient Instructions (Signed)
Continue on CPAP at bedtime.   Keep up the good work Work on [employer] loss Healthy sleep regimen Not drive if sleepy Follow-up in 1 year with Dr. Mortimer Fries or Suhaan Perleberg NP and As needed

## 2021-12-16 NOTE — Assessment & Plan Note (Signed)
Mild obstructive sleep apnea.  Patient has excellent control and compliance on CPAP.  Patient education was given on sleep apnea and CPAP care. - discussed how weight can impact sleep and risk for sleep disordered breathing - discussed options to assist with weight loss: combination of diet modification, cardiovascular and strength training exercises   - had an extensive discussion regarding the adverse health consequences related to untreated sleep disordered breathing - specifically discussed the risks for hypertension, coronary artery disease, cardiac dysrhythmias, cerebrovascular disease, and diabetes - lifestyle modification discussed   - discussed how sleep disruption can increase risk of accidents, particularly when driving - safe driving practices were discussed    We will continue on same settings.  Plan  Patient Instructions  Continue on CPAP at bedtime.   Keep up the good work Work on [employer] loss Healthy sleep regimen Not drive if sleepy Follow-up in 1 year with Dr. Mortimer Fries or Dyllin Gulley NP and As needed

## 2021-12-16 NOTE — Progress Notes (Signed)
$'@Patient'e$  ID: Kari Rice, female    DOB: 1976/03/07, 46 y.o.   MRN: [id number]  Chief Complaint  Patient presents with   Consult    Referring provider: Abner Greenspan, MD  HPI: 46 year old female followed for obstructive sleep apnea .  Presents December 16, 2021 to reestablish for sleep apnea, former patient of Dr. Mortimer Fries.   TEST/EVENTS :  Home sleep study December 30, 2015 AHI 12.5  12/16/2021 Sleep consult  Patient presents for a sleep consult today to reestablish for sleep apnea.  Patient was previously diagnosed with sleep apnea in 2017 with a home sleep study that showed mild sleep apnea with AHI of 12.5's/hour.  Patient was started on CPAP.  She was last seen in the office in May 2019.  Patient says she typically goes to bed about 9 to 10 PM.  Only takes about 5 to 10 minutes to go to sleep.  Is up about 3 times each night.  Gets up about 5 AM.  Caffeine intake is usually 2 cups of coffee daily.  No symptoms suspicious for cataplexy or sleep paralysis.  Epworth score is 5 out of 24.  Typically gets sleepy if she is watching TV, passenger of a car and in the afternoon hours.  She uses a nasal mask.  Gets her supplies and CPAP from [place].  She does not use any sleep aids. CPAP download shows excellent compliance with 100% usage.  Daily average usage at 7 hours.  Patient is on auto CPAP 5 to 15 cm H2O.,  AHI 1.1.  Daily average pressure at 10.3 cm H2O.  Medical history significant for Bell's palsy, DVT, GERD, Depression   Surgical history significant for cholecystectomy, nasal septoplasty, hysterectomy  Social history patient lives with her mother.  She works in Therapist, art at [employer].  She has no children.  She is divorced.  She is a never smoker.  No alcohol or drug use.  Family history positive for heart disease, rheumatoid arthritis and cancer.   Allergies  Allergen Reactions   Penicillins Rash   Sulfa Antibiotics Rash    Immunization History  Administered Date(s)  Administered   Influenza,inj,Quad PF,6+ Mos 04/30/2014, 01/14/2016, 01/11/2018, 12/05/2018, 12/26/2019, 04/18/2021   PFIZER(Purple Top)SARS-COV-2 Vaccination 06/12/2019, 07/03/2019, 04/01/2020   Td 04/02/2008   Tdap 08/10/2017    Past Medical History:  Diagnosis Date   Arthritis    right hand   DVT (deep venous thrombosis) ([place]) 2013   GERD (gastroesophageal reflux disease)    PONV (postoperative nausea and vomiting)    after cholecystectomy   Sleep apnea    CPAP   Varicose veins     Tobacco History: Social History   Tobacco Use  Smoking Status Never   Passive exposure: Past  Smokeless Tobacco Never   Counseling given: Not Answered   Outpatient Medications Prior to Visit  Medication Sig Dispense Refill   acetaminophen (TYLENOL) 650 MG CR tablet Take 1,300 mg by mouth every 8 (eight) hours as needed for pain.     b complex vitamins capsule Take 1 capsule by mouth daily.     Cholecalciferol (VITAMIN D3) 2000 units TABS Take 1 tablet by mouth daily.     FLUoxetine (PROZAC) 20 MG capsule Take 1 capsule (20 mg total) by mouth daily. 90 capsule 3   NON FORMULARY Support hose to the waist for dx of venous insufficiency and edema 15-20 mm Hg     omeprazole (PRILOSEC) 20 MG capsule Take 20 mg by mouth  as needed.     No facility-administered medications prior to visit.     Review of Systems:   Constitutional:   No  weight loss, night sweats,  Fevers, chills, fatigue, or  lassitude.  HEENT:   No headaches,  Difficulty swallowing,  Tooth/dental problems, or  Sore throat,                No sneezing, itching, ear ache, nasal congestion, post nasal drip,   CV:  No chest pain,  Orthopnea, PND, swelling in lower extremities, anasarca, dizziness, palpitations, syncope.   GI  No heartburn, indigestion, abdominal pain, nausea, vomiting, diarrhea, change in bowel habits, loss of appetite, bloody stools.   Resp: No shortness of breath with exertion or at rest.  No excess mucus, no  productive cough,  No non-productive cough,  No coughing up of blood.  No change in color of mucus.  No wheezing.  No chest wall deformity  Skin: no rash or lesions.  GU: no dysuria, change in color of urine, no urgency or frequency.  No flank pain, no hematuria   MS:  No joint pain or swelling.  No decreased range of motion.  No back pain.    Physical Exam  BP (!) 130/90 (BP Location: Left Arm, Patient Position: Sitting, Cuff Size: Large)   Pulse 78   Temp 97.9 F (36.6 C) (Oral)   Ht '5\' 4"'$  (1.626 m)   Wt 238 lb (108 kg)   LMP  (LMP Unknown)   SpO2 95%   BMI 40.85 kg/m   GEN: A/Ox3; pleasant , NAD, well nourished    HEENT:  [place]/AT,   NOSE-clear, THROAT-clear, no lesions, no postnasal drip or exudate noted. Class 2-3 MP airway   NECK:  Supple w/ fair ROM; no JVD; normal carotid impulses w/o bruits; no thyromegaly or nodules palpated; no lymphadenopathy.    RESP  Clear  P & A; w/o, wheezes/ rales/ or rhonchi. no accessory muscle use, no dullness to percussion  CARD:  RRR, no m/r/g, no peripheral edema, pulses intact, no cyanosis or clubbing.  GI:   Soft & nt; nml bowel sounds; no organomegaly or masses detected.   Musco: Warm bil, no deformities or joint swelling noted.   Neuro: alert, no focal deficits noted.    Skin: Warm, no lesions or rashes    Lab Results:    BNP No results found for: "BNP"  ProBNP No results found for: "PROBNP"  Imaging: No results found.        No data to display          No results found for: "NITRICOXIDE"      Assessment & Plan:   OSA (obstructive sleep apnea) Mild obstructive sleep apnea.  Patient has excellent control and compliance on CPAP.  Patient education was given on sleep apnea and CPAP care. - discussed how weight can impact sleep and risk for sleep disordered breathing - discussed options to assist with weight loss: combination of diet modification, cardiovascular and strength training exercises   - had  an extensive discussion regarding the adverse health consequences related to untreated sleep disordered breathing - specifically discussed the risks for hypertension, coronary artery disease, cardiac dysrhythmias, cerebrovascular disease, and diabetes - lifestyle modification discussed   - discussed how sleep disruption can increase risk of accidents, particularly when driving - safe driving practices were discussed    We will continue on same settings.  Plan  Patient Instructions  Continue on CPAP at bedtime.  Keep up the good work Work on [employer] loss Healthy sleep regimen Not drive if sleepy Follow-up in 1 year with Dr. Mortimer Fries or Dejanee Thibeaux NP and As needed       Obesity (BMI 30-39.9) Encouraged on healthy weight loss     Rexene Edison, NP 12/16/2021

## 2021-12-28 ENCOUNTER — Other Ambulatory Visit: Payer: Self-pay | Admitting: Family Medicine

## 2022-02-18 ENCOUNTER — Encounter: Payer: Self-pay | Admitting: Family Medicine

## 2022-02-18 ENCOUNTER — Ambulatory Visit (INDEPENDENT_AMBULATORY_CARE_PROVIDER_SITE_OTHER): Payer: Managed Care, Other (non HMO) | Admitting: Family Medicine

## 2022-02-18 VITALS — BP 106/64 | HR 68 | Temp 97.7°F | Ht 64.0 in | Wt 232.5 lb

## 2022-02-18 DIAGNOSIS — J0110 Acute frontal sinusitis, unspecified: Secondary | ICD-10-CM | POA: Diagnosis not present

## 2022-02-18 MED ORDER — DOXYCYCLINE HYCLATE 100 MG PO TABS: 100.0000 mg | ORAL_TABLET | Freq: Two times a day (BID) | ORAL | 0 refills | Status: DC

## 2022-02-18 NOTE — Assessment & Plan Note (Signed)
S/p case of covid  With L sinus and ear pain and yellow drainage Px doxycycline  Symptom control- mucinex , fluids, saline, flonase  Update if not starting to improve in a week or if worsening    Handout given

## 2022-02-18 NOTE — Patient Instructions (Addendum)
Continue flonase and nasal saline Mucinex if needed   Take the doxycycline  as directed for sinus infection   Update if not starting to improve in a week or if worsening

## 2022-02-18 NOTE — Progress Notes (Signed)
Subjective:    Patient ID: Kari Rice, female    DOB: [birth year], 46 y.o.   MRN: [id number]  HPI Pt presents for ear discomfort  Wt Readings from Last 3 Encounters:  02/18/22 232 lb 8 oz (105.5 kg)  12/16/21 238 lb (108 kg)  12/03/21 235 lb 3.2 oz (106.7 kg)   39.91 kg/m  Dx with covid on 10/31 Ears got really stopped up  Sinus medicine helped for a while mucinex max product   Now ear is bothering her L ear  A lot of congestion in L side of face  Some sinus pain above L eye   Had fever for 3 days (101.5- then around 99)  That is better  Still some cough during the day No sob/wheeze  Not prod  Sinus drainage   Mucous is yellow   Patient Active Problem List   Diagnosis Date Noted   Obesity (BMI 30-39.9) 12/16/2021   OSA (obstructive sleep apnea) 12/03/2021   Sigmoid diverticulosis    Colon cancer screening 04/18/2021   Ear discomfort, right 04/18/2021   Dizziness 08/28/2020   Dysthymia 12/26/2019   Fibroid uterus 12/26/2019   Right leg swelling 12/11/2019   Fungal nail infection 10/11/2019   Contraception management 10/11/2019   Great toe pain, right 12/05/2018   Left anterior knee pain 04/26/2018   Injury of left toe 01/25/2018   Left hand paresthesia 11/22/2017   GERD (gastroesophageal reflux disease) 11/22/2017   H/O vaginitis 08/10/2017   Bell's palsy 07/07/2017   Headache 07/07/2017   Hair loss 03/25/2016   Vitamin D deficiency 01/15/2016   Routine general medical examination at a health care facility 05/29/2014   Encounter for routine gynecological examination 05/29/2014   Screening for HIV (human immunodeficiency virus) 05/29/2014   Encounter for screening mammogram for breast cancer 05/29/2014   Acute sinusitis 05/02/2014   Joint pain 04/10/2014   History of DVT (deep vein thrombosis) 07/06/2012   Varicose veins of lower extremities with other complications 78/29/5621   Venous (peripheral) insufficiency 12/23/2011   Phlebitis and  thrombophlebitis of the leg 11/06/2011   Venous insufficiency, peripheral 10/26/2011   Pedal edema 10/26/2011   IRREGULAR MENSES 02/29/2008   TRICHOMONAL VAGINITIS 03/18/2007   Past Medical History:  Diagnosis Date   Arthritis    right hand   DVT (deep venous thrombosis) ([place]) 2013   GERD (gastroesophageal reflux disease)    PONV (postoperative nausea and vomiting)    after cholecystectomy   Sleep apnea    CPAP   Varicose veins    Past Surgical History:  Procedure Laterality Date   CHOLECYSTECTOMY  Feb. 2006   Gall Bladder   COLONOSCOPY WITH PROPOFOL N/A 05/23/2021   Procedure: COLONOSCOPY WITH PROPOFOL;  Surgeon: Lin Landsman, MD;  Location[facility] ENDOSCOPY;  Service: Gastroenterology;  Laterality: N/A;   HAND SURGERY  2016   NASAL SEPTOPLASTY W/ TURBINOPLASTY Bilateral 10/01/2017   Procedure: NASAL SEPTOPLASTY WITH SUBMUCOCELE RESECTION OF TURBINATE;  Surgeon: Beverly Gust, MD;  Location: [facility];  Service: ENT;  Laterality: Bilateral;  Sleep apnea   ROBOTIC ASSISTED LAPAROSCOPIC HYSTERECTOMY AND SALPINGECTOMY Bilateral 03/13/2020   Procedure: XI ROBOTIC ASSISTED LAPAROSCOPIC HYSTERECTOMY AND SALPINGECTOMY;  Surgeon: Homero Fellers, MD;  Location: [facility] ORS;  Service: Gynecology;  Laterality: Bilateral;   THROMBECTOMY Right 01/13/12 and  01/21/12   Right iliac vein stent and Vein thrombosis   Social History   Tobacco Use   Smoking status: Never    Passive exposure: Past  Smokeless tobacco: Never  Vaping Use   Vaping Use: Never used  Substance Use Topics   Alcohol use: No    Alcohol/week: 0.0 standard drinks of alcohol   Drug use: No   Family History  Problem Relation Age of Onset   Arthritis Mother    Cancer Mother        uterine    Heart disease Father        Heart Disease before age 64   Cancer Father        skin    Hypertension Father    Heart attack Father    Allergies  Allergen Reactions   Penicillins Rash   Sulfa Antibiotics  Rash   Current Outpatient Medications on File Prior to Visit  Medication Sig Dispense Refill   acetaminophen (TYLENOL) 650 MG CR tablet Take 1,300 mg by mouth every 8 (eight) hours as needed for pain.     b complex vitamins capsule Take 1 capsule by mouth daily.     Cholecalciferol (VITAMIN D3) 2000 units TABS Take 1 tablet by mouth daily.     FLUoxetine (PROZAC) 20 MG capsule TAKE 1 CAPSULE BY MOUTH EVERY DAY 90 capsule 1   NON FORMULARY Support hose to the waist for dx of venous insufficiency and edema 15-20 mm Hg     omeprazole (PRILOSEC) 20 MG capsule Take 20 mg by mouth as needed.     No current facility-administered medications on file prior to visit.      Review of Systems  Constitutional:  Positive for appetite change. Negative for fatigue and fever.  HENT:  Positive for congestion, ear pain, postnasal drip, rhinorrhea, sinus pressure and sore throat. Negative for ear discharge, facial swelling, nosebleeds and voice change.   Eyes:  Negative for pain, redness and itching.  Respiratory:  Positive for cough. Negative for shortness of breath and wheezing.   Cardiovascular:  Negative for chest pain.  Gastrointestinal:  Negative for abdominal pain, diarrhea, nausea and vomiting.  Endocrine: Negative for polyuria.  Genitourinary:  Negative for dysuria, frequency and urgency.  Musculoskeletal:  Negative for arthralgias and myalgias.  Allergic/Immunologic: Negative for immunocompromised state.  Neurological:  Positive for headaches. Negative for dizziness, tremors, syncope, weakness and numbness.  Hematological:  Negative for adenopathy. Does not bruise/bleed easily.  Psychiatric/Behavioral:  Negative for dysphoric mood. The patient is not nervous/anxious.        Objective:   Physical Exam Constitutional:      General: She is not in acute distress.    Appearance: Normal appearance. She is well-developed. She is obese. She is not ill-appearing or diaphoretic.  HENT:     Head:  Normocephalic and atraumatic.     Comments: L sided frontal and maxillary sinus tenderness     Right Ear: Tympanic membrane, ear canal and external ear normal.     Left Ear: Tympanic membrane, ear canal and external ear normal.     Ears:     Comments: L TM is more dull than the R No erythema or bulging     Nose: Congestion and rhinorrhea present.     Mouth/Throat:     Mouth: Mucous membranes are moist.     Pharynx: Oropharynx is clear. No oropharyngeal exudate or posterior oropharyngeal erythema.     Comments: Clear pnd Eyes:     General:        Right eye: No discharge.        Left eye: No discharge.     Conjunctiva/sclera: Conjunctivae  normal.     Pupils: Pupils are equal, round, and reactive to light.  Cardiovascular:     Rate and Rhythm: Normal rate and regular rhythm.  Pulmonary:     Effort: Pulmonary effort is normal. No respiratory distress.     Breath sounds: Normal breath sounds. No stridor. No wheezing, rhonchi or rales.     Comments: Good air exch No rales or rhonchi Musculoskeletal:     Cervical back: Normal range of motion and neck supple. No tenderness.  Lymphadenopathy:     Cervical: No cervical adenopathy.  Skin:    General: Skin is warm and dry.     Findings: No rash.  Neurological:     Mental Status: She is alert.     Cranial Nerves: No cranial nerve deficit.     Coordination: Coordination normal.  Psychiatric:        Mood and Affect: Mood normal.           Assessment & Plan:   Problem List Items Addressed This Visit       Respiratory   Acute sinusitis - Primary    S/p case of covid  With L sinus and ear pain and yellow drainage Px doxycycline  Symptom control- mucinex , fluids, saline, flonase  Update if not starting to improve in a week or if worsening    Handout given       Relevant Medications   doxycycline (VIBRA-TABS) 100 MG tablet

## 2022-02-26 ENCOUNTER — Ambulatory Visit
Admission: RE | Admit: 2022-02-26 | Discharge: 2022-02-26 | Disposition: A | Discharge status: Home / Self Care | Payer: Managed Care, Other (non HMO) | Source: Ambulatory Visit

## 2022-02-26 ENCOUNTER — Telehealth: Payer: Managed Care, Other (non HMO) | Admitting: Physician Assistant

## 2022-02-26 VITALS — BP 125/80 | HR 78 | Temp 97.7°F | Resp 18

## 2022-02-26 DIAGNOSIS — J019 Acute sinusitis, unspecified: Secondary | ICD-10-CM

## 2022-02-26 DIAGNOSIS — J329 Chronic sinusitis, unspecified: Secondary | ICD-10-CM

## 2022-02-26 DIAGNOSIS — B9689 Other specified bacterial agents as the cause of diseases classified elsewhere: Secondary | ICD-10-CM | POA: Diagnosis not present

## 2022-02-26 DIAGNOSIS — M6530 Trigger finger, unspecified finger: Secondary | ICD-10-CM | POA: Insufficient documentation

## 2022-02-26 MED ORDER — AZITHROMYCIN 250 MG PO TABS: ORAL_TABLET | ORAL | 0 refills | Status: DC

## 2022-02-26 MED ORDER — PREDNISONE 20 MG PO TABS: ORAL_TABLET | ORAL | 0 refills | Status: AC

## 2022-02-26 NOTE — Progress Notes (Signed)
Because of recurring symptoms after treatment and new onset dizziness/vertigo, we require you be evaluated in person.    NOTE: There will be NO CHARGE for this eVisit   If you are having a true medical emergency please call 911.      For an urgent face to face visit, [facility] has seven urgent care centers for your convenience:     [facility] [place] Get Driving Directions [phone] [address] [place], [place] [address]    [facility]) Get Driving Directions [phone] [place], [place] [address]  [facility] [place] ([place]) Get Driving Directions [phone] [address] [place],  [place]  [address]  [facility] - at [address] Get Driving Directions  [phone] [address] [place],  [place] [address]   [facility] Get Driving Directions [phone] [address] [place], [place], [place] [address]   [facility] Get Driving Directions  [phone] [address] [place], [place] [address]   [facility] [place] Get Driving Directions [phone] [address], [place], [place] [address]  Your MyChart E-visit questionnaire answers were reviewed by a board certified advanced clinical practitioner to complete your personal care plan based on your specific symptoms.  Thank you for using e-Visits.

## 2022-02-26 NOTE — ED Provider Notes (Signed)
[facility]    CSN: [id number] Arrival date & time: 02/26/22  1343      History   Chief Complaint Chief Complaint  Patient presents with   Nasal Congestion    Recurring sinus infection,  treated on 11/08 symptoms are returning - Entered by patient    HPI Kari Rice is a 46 y.o. female.   HPI  To urgent care with complaint of sinus pressure and nasal congestion for a few weeks.  She states she was diagnosed and treated for sinus infection on November 8 and completed treatment yesterday.  Past Medical History:  Diagnosis Date   Arthritis    right hand   DVT (deep venous thrombosis) ([place]) 2013   GERD (gastroesophageal reflux disease)    PONV (postoperative nausea and vomiting)    after cholecystectomy   Sleep apnea    CPAP   Varicose veins     Patient Active Problem List   Diagnosis Date Noted   Acquired trigger finger 02/26/2022   Obesity (BMI 30-39.9) 12/16/2021   OSA (obstructive sleep apnea) 12/03/2021   Sigmoid diverticulosis    Colon cancer screening 04/18/2021   Ear discomfort, right 04/18/2021   Dizziness 08/28/2020   Dysthymia 12/26/2019   Fibroid uterus 12/26/2019   Right leg swelling 12/11/2019   Fungal nail infection 10/11/2019   Contraception management 10/11/2019   Great toe pain, right 12/05/2018   Patellofemoral stress syndrome 07/20/2018   Left anterior knee pain 04/26/2018   Injury of left toe 01/25/2018   Left hand paresthesia 11/22/2017   GERD (gastroesophageal reflux disease) 11/22/2017   H/O vaginitis 08/10/2017   Bell's palsy 07/07/2017   Headache 07/07/2017   Hair loss 03/25/2016   Vitamin D deficiency 01/15/2016   Routine general medical examination at a health care facility 05/29/2014   Encounter for routine gynecological examination 05/29/2014   Screening for HIV (human immunodeficiency virus) 05/29/2014   Encounter for screening mammogram for breast cancer 05/29/2014   Acute sinusitis 05/02/2014   Joint pain  04/10/2014   History of DVT (deep vein thrombosis) 07/06/2012   Varicose veins of lower extremities with other complications 30/16/0109   Venous (peripheral) insufficiency 12/23/2011   Phlebitis and thrombophlebitis of the leg 11/06/2011   Venous insufficiency, peripheral 10/26/2011   Pedal edema 10/26/2011   IRREGULAR MENSES 02/29/2008   TRICHOMONAL VAGINITIS 03/18/2007    Past Surgical History:  Procedure Laterality Date   CHOLECYSTECTOMY  Feb. 2006   Gall Bladder   COLONOSCOPY WITH PROPOFOL N/A 05/23/2021   Procedure: COLONOSCOPY WITH PROPOFOL;  Surgeon: Lin Landsman, MD;  Location: [facility] ENDOSCOPY;  Service: Gastroenterology;  Laterality: N/A;   HAND SURGERY  2016   NASAL SEPTOPLASTY W/ TURBINOPLASTY Bilateral 10/01/2017   Procedure: NASAL SEPTOPLASTY WITH SUBMUCOCELE RESECTION OF TURBINATE;  Surgeon: Beverly Gust, MD;  Location: [facility];  Service: ENT;  Laterality: Bilateral;  Sleep apnea   ROBOTIC ASSISTED LAPAROSCOPIC HYSTERECTOMY AND SALPINGECTOMY Bilateral 03/13/2020   Procedure: XI ROBOTIC ASSISTED LAPAROSCOPIC HYSTERECTOMY AND SALPINGECTOMY;  Surgeon: Homero Fellers, MD;  Location: [facility] ORS;  Service: Gynecology;  Laterality: Bilateral;   THROMBECTOMY Right 01/13/12 and  01/21/12   Right iliac vein stent and Vein thrombosis    OB History     Gravida  0   Para  0   Term  0   Preterm  0   AB  0   Living  0      SAB  0   IAB  0  Ectopic  0   Multiple  0   Live Births  0            Home Medications    Prior to Admission medications   Medication Sig Start Date End Date Taking? Authorizing Provider  benzonatate (TESSALON) 100 MG capsule Take 200 mg by mouth every 8 (eight) hours as needed. 02/10/22  Yes [provider]  fluticasone (FLONASE) 50 MCG/ACT nasal spray Place into both nostrils. 02/10/22  Yes [provider]  acetaminophen (TYLENOL) 650 MG CR tablet Take 1,300 mg by mouth every 8 (eight) hours  as needed for pain.    [provider]  b complex vitamins capsule Take 1 capsule by mouth daily.    [provider]  Cholecalciferol (VITAMIN D3) 2000 units TABS Take 1 tablet by mouth daily.    [provider]  doxycycline (VIBRA-TABS) 100 MG tablet Take 1 tablet (100 mg total) by mouth 2 (two) times daily. 02/18/22   Tower, Wynelle Fanny, MD  FLUoxetine (PROZAC) 20 MG capsule TAKE 1 CAPSULE BY MOUTH EVERY DAY 12/29/21   Tower, Wynelle Fanny, MD  NON FORMULARY Support hose to the waist for dx of venous insufficiency and edema 15-20 mm Hg    [provider]  omeprazole (PRILOSEC) 20 MG capsule Take 20 mg by mouth as needed.    [provider]    Family History Family History  Problem Relation Age of Onset   Arthritis Mother    Cancer Mother        uterine    Heart disease Father        Heart Disease before age 33   Cancer Father        skin    Hypertension Father    Heart attack Father     Social History Social History   Tobacco Use   Smoking status: Never    Passive exposure: Past   Smokeless tobacco: Never  Vaping Use   Vaping Use: Never used  Substance Use Topics   Alcohol use: No    Alcohol/week: 0.0 standard drinks of alcohol   Drug use: No     Allergies   Penicillins and Sulfa antibiotics   Review of Systems Review of Systems   Physical Exam Triage Vital Signs ED Triage Vitals [02/26/22 1419]  Enc Vitals Group     BP 125/80     Pulse Rate 78     Resp 18     Temp 97.7 F (36.5 C)     Temp src      SpO2 98 %     Weight      Height      Head Circumference      Peak Flow      Pain Score 0     Pain Loc      Pain Edu?      Excl. in [place]?    No data found.  Updated Vital Signs BP 125/80   Pulse 78   Temp 97.7 F (36.5 C)   Resp 18   LMP  (LMP Unknown)   SpO2 98%   Visual Acuity Right Eye Distance:   Left Eye Distance:   Bilateral Distance:    Right Eye Near:   Left Eye Near:    Bilateral Near:      Physical Exam HENT:     Nose: Congestion present.     Left Sinus: Maxillary sinus tenderness and frontal sinus tenderness present.  Skin:  General: Skin is warm and dry.  Neurological:     General: No focal deficit present.     Mental Status: She is oriented to person, place, and time.  Psychiatric:        Mood and Affect: Mood normal.        Behavior: Behavior normal.      UC Treatments / Results  Labs (all labs ordered are listed, but only abnormal results are displayed) Labs Reviewed - No data to display  EKG   Radiology No results found.  Procedures Procedures (including critical care time)  Medications Ordered in UC Medications - No data to display  Initial Impression / Assessment and Plan / UC Course  I have reviewed the triage vital signs and the nursing notes.  Pertinent labs & imaging results that were available during my care of the patient were reviewed by me and considered in my medical decision making (see chart for details).   Patient is afebrile here without recent antipyretics. Satting well on room air. Overall is well appearing, well hydrated, without respiratory distress. Pulmonary exam is unremarkable.  Left-sided maxillary facial pain.  Completed treatment with doxycycline.  Possibly failed treatment so will prescribe azithromycin and pair with a corticosteroid.   Final Clinical Impressions(s) / UC Diagnoses   Final diagnoses:  None   Discharge Instructions   None    ED Prescriptions   None    PDMP not reviewed this encounter.   Rose Phi, [place] 02/26/22 1449

## 2022-02-26 NOTE — Discharge Instructions (Signed)
Follow up here or with your primary care provider if your symptoms are worsening or not improving with treatment.     

## 2022-02-26 NOTE — ED Triage Notes (Signed)
Pt. Presents to UC w/ c/o sinus pressure and nasal congestion for a few weeks. Pt. States she was diagnosed and treated for a sinus infection on Nov. 8th and just finished her treatment yesterday.

## 2022-03-24 ENCOUNTER — Encounter: Payer: Self-pay | Admitting: *Deleted

## 2022-03-24 ENCOUNTER — Encounter: Payer: Self-pay | Admitting: Family Medicine

## 2022-03-24 ENCOUNTER — Ambulatory Visit (INDEPENDENT_AMBULATORY_CARE_PROVIDER_SITE_OTHER): Payer: Managed Care, Other (non HMO) | Admitting: Family Medicine

## 2022-03-24 VITALS — BP 116/70 | HR 65 | Temp 97.2°F | Ht 64.0 in | Wt 236.1 lb

## 2022-03-24 DIAGNOSIS — R202 Paresthesia of skin: Secondary | ICD-10-CM

## 2022-03-24 MED ORDER — MELOXICAM 15 MG PO TABS: 15.0000 mg | ORAL_TABLET | Freq: Every day | ORAL | 1 refills | Status: DC | PRN

## 2022-03-24 NOTE — Progress Notes (Signed)
Subjective:    Patient ID: Kari Rice, female    DOB: 09-11-1975, 46 y.o.   MRN: [id number]  HPI Pt presents for L hand numbness  ? Carpal tunnel   Wt Readings from Last 3 Encounters:  03/24/22 236 lb 2 oz (107.1 kg)  02/18/22 232 lb 8 oz (105.5 kg)  12/16/21 238 lb (108 kg)   40.53 kg/m  Is r handed   Left hand is numb and tingly in 2,3 rd fingers A little burning pain  Wakes her up in the middle of the night  Cannot get in a comfortable position  Usually sleeps on her L side , occ falls asleep on that arm   No lost strength   Has worn a wrist splint -does not resolve it  During the day can be triggered by holding a clip board  Working physically does not bother her   She had tenosynovectomy in 2014 in her palm   Not taking anything   Patient Active Problem List   Diagnosis Date Noted   Acquired trigger finger 02/26/2022   Obesity (BMI 30-39.9) 12/16/2021   OSA (obstructive sleep apnea) 12/03/2021   Sigmoid diverticulosis    Colon cancer screening 04/18/2021   Ear discomfort, right 04/18/2021   Dizziness 08/28/2020   Dysthymia 12/26/2019   Fibroid uterus 12/26/2019   Right leg swelling 12/11/2019   Fungal nail infection 10/11/2019   Contraception management 10/11/2019   Great toe pain, right 12/05/2018   Patellofemoral stress syndrome 07/20/2018   Left anterior knee pain 04/26/2018   Injury of left toe 01/25/2018   Left hand paresthesia 11/22/2017   GERD (gastroesophageal reflux disease) 11/22/2017   H/O vaginitis 08/10/2017   Bell's palsy 07/07/2017   Headache 07/07/2017   Hair loss 03/25/2016   Vitamin D deficiency 01/15/2016   Routine general medical examination at a health care facility 05/29/2014   Encounter for routine gynecological examination 05/29/2014   Screening for HIV (human immunodeficiency virus) 05/29/2014   Encounter for screening mammogram for breast cancer 05/29/2014   Acute sinusitis 05/02/2014   Joint pain 04/10/2014    History of DVT (deep vein thrombosis) 07/06/2012   Varicose veins of lower extremities with other complications 86/57/8469   Venous (peripheral) insufficiency 12/23/2011   Phlebitis and thrombophlebitis of the leg 11/06/2011   Venous insufficiency, peripheral 10/26/2011   Pedal edema 10/26/2011   IRREGULAR MENSES 02/29/2008   TRICHOMONAL VAGINITIS 03/18/2007   Past Medical History:  Diagnosis Date   Arthritis    right hand   DVT (deep venous thrombosis) ([place]) 2013   GERD (gastroesophageal reflux disease)    PONV (postoperative nausea and vomiting)    after cholecystectomy   Sleep apnea    CPAP   Varicose veins    Past Surgical History:  Procedure Laterality Date   CHOLECYSTECTOMY  Feb. 2006   Gall Bladder   COLONOSCOPY WITH PROPOFOL N/A 05/23/2021   Procedure: COLONOSCOPY WITH PROPOFOL;  Surgeon: Lin Landsman, MD;  Location: [facility] ENDOSCOPY;  Service: Gastroenterology;  Laterality: N/A;   HAND SURGERY  2016   NASAL SEPTOPLASTY W/ TURBINOPLASTY Bilateral 10/01/2017   Procedure: NASAL SEPTOPLASTY WITH SUBMUCOCELE RESECTION OF TURBINATE;  Surgeon: Beverly Gust, MD;  Location: [facility];  Service: ENT;  Laterality: Bilateral;  Sleep apnea   ROBOTIC ASSISTED LAPAROSCOPIC HYSTERECTOMY AND SALPINGECTOMY Bilateral 03/13/2020   Procedure: XI ROBOTIC ASSISTED LAPAROSCOPIC HYSTERECTOMY AND SALPINGECTOMY;  Surgeon: Homero Fellers, MD;  Location: [facility] ORS;  Service: Gynecology;  Laterality: Bilateral;  THROMBECTOMY Right 01/13/12 and  01/21/12   Right iliac vein stent and Vein thrombosis   Social History   Tobacco Use   Smoking status: Never    Passive exposure: Past   Smokeless tobacco: Never  Vaping Use   Vaping Use: Never used  Substance Use Topics   Alcohol use: No    Alcohol/week: 0.0 standard drinks of alcohol   Drug use: No   Family History  Problem Relation Age of Onset   Arthritis Mother    Cancer Mother        uterine    Heart disease Father         Heart Disease before age 70   Cancer Father        skin    Hypertension Father    Heart attack Father    Allergies  Allergen Reactions   Penicillins Rash   Sulfa Antibiotics Rash   Current Outpatient Medications on File Prior to Visit  Medication Sig Dispense Refill   acetaminophen (TYLENOL) 650 MG CR tablet Take 1,300 mg by mouth every 8 (eight) hours as needed for pain.     b complex vitamins capsule Take 1 capsule by mouth daily.     benzonatate (TESSALON) 100 MG capsule Take 200 mg by mouth every 8 (eight) hours as needed.     Cholecalciferol (VITAMIN D3) 2000 units TABS Take 1 tablet by mouth daily.     fluticasone (FLONASE) 50 MCG/ACT nasal spray Place into both nostrils.     NON FORMULARY Support hose to the waist for dx of venous insufficiency and edema 15-20 mm Hg     omeprazole (PRILOSEC) 20 MG capsule Take 20 mg by mouth as needed.     No current facility-administered medications on file prior to visit.     Review of Systems  Constitutional:  Negative for activity change, appetite change, fatigue, fever and unexpected weight change.  HENT:  Negative for congestion, ear pain, rhinorrhea, sinus pressure and sore throat.   Eyes:  Negative for pain, redness and visual disturbance.  Respiratory:  Negative for cough, shortness of breath and wheezing.   Cardiovascular:  Negative for chest pain and palpitations.  Gastrointestinal:  Negative for abdominal pain, blood in stool, constipation and diarrhea.  Endocrine: Negative for polydipsia and polyuria.  Genitourinary:  Negative for dysuria, frequency and urgency.  Musculoskeletal:  Negative for arthralgias, back pain and myalgias.  Skin:  Negative for pallor and rash.  Allergic/Immunologic: Negative for environmental allergies.  Neurological:  Positive for numbness. Negative for dizziness, syncope and headaches.  Hematological:  Negative for adenopathy. Does not bruise/bleed easily.  Psychiatric/Behavioral:  Negative  for decreased concentration and dysphoric mood. The patient is not nervous/anxious.        Objective:   Physical Exam Constitutional:      General: She is not in acute distress.    Appearance: Normal appearance. She is obese. She is not ill-appearing.  Cardiovascular:     Rate and Rhythm: Normal rate and regular rhythm.     Pulses: Normal pulses.  Musculoskeletal:     Right hand: Normal.     Left hand: No swelling, deformity, tenderness or bony tenderness. Normal range of motion. Normal strength. There is no disruption of two-point discrimination. Normal capillary refill. Normal pulse.  Neurological:     Mental Status: She is alert.     Cranial Nerves: No cranial nerve deficit.     Sensory: Sensory deficit present.     Motor: No weakness.  Coordination: Coordination normal.     Deep Tendon Reflexes: Reflexes normal.     Comments: Pos tinel and phalen tests in L hand  No wrist tenderness  Strength is full bilateral hands   Decreased sens to lt touch in L 2,3 rd fingers Nl temp sensation     Psychiatric:        Mood and Affect: Mood normal.           Assessment & Plan:   Problem List Items Addressed This Visit       Other   Left hand paresthesia - Primary    History and exam consistent with carpal tunnel in pt with h/o unrelated hand surgery in the past  Sympt worse at night  Inst to continue wrist splint at night  Meloxicam 15 mg daily prn with food   Ref done to ortho/hand for eval/tx  May need testing  Inst to update if symptoms worsen before then      Relevant Orders   Ambulatory referral to Orthopedic Surgery

## 2022-03-24 NOTE — Patient Instructions (Signed)
Continue the wrist splint at night   Try meloxicam with food daily as needed   I placed a referral to hand specialist  If you don't hear in the next 1-2 weeks let us know

## 2022-03-24 NOTE — Assessment & Plan Note (Signed)
History and exam consistent with carpal tunnel in pt with h/o unrelated hand surgery in the past  Sympt worse at night  Inst to continue wrist splint at night  Meloxicam 15 mg daily prn with food   Ref done to ortho/hand for eval/tx  May need testing  Inst to update if symptoms worsen before then

## 2022-05-12 ENCOUNTER — Encounter: Payer: Self-pay | Admitting: Family Medicine

## 2022-05-12 MED ORDER — FLUOXETINE HCL 20 MG PO TABS: 20.0000 mg | ORAL_TABLET | Freq: Every day | ORAL | 3 refills | Status: DC

## 2022-05-12 NOTE — Telephone Encounter (Signed)
Fluoxetine not on med list so will route to PCP for review

## 2022-06-07 ENCOUNTER — Ambulatory Visit
Admission: EM | Admit: 2022-06-07 | Discharge: 2022-06-07 | Disposition: A | Discharge status: Home / Self Care | Payer: Managed Care, Other (non HMO) | Attending: Emergency Medicine | Admitting: Emergency Medicine

## 2022-06-07 DIAGNOSIS — Z1152 Encounter for screening for COVID-19: Secondary | ICD-10-CM | POA: Diagnosis not present

## 2022-06-07 DIAGNOSIS — J309 Allergic rhinitis, unspecified: Secondary | ICD-10-CM | POA: Insufficient documentation

## 2022-06-07 DIAGNOSIS — R058 Other specified cough: Secondary | ICD-10-CM | POA: Insufficient documentation

## 2022-06-07 DIAGNOSIS — R0981 Nasal congestion: Secondary | ICD-10-CM | POA: Insufficient documentation

## 2022-06-07 DIAGNOSIS — J069 Acute upper respiratory infection, unspecified: Secondary | ICD-10-CM | POA: Diagnosis present

## 2022-06-07 MED ORDER — PREDNISONE 10 MG (21) PO TBPK: ORAL_TABLET | Freq: Every day | ORAL | 0 refills | Status: DC

## 2022-06-07 NOTE — Discharge Instructions (Addendum)
Take the prednisone as directed.    Your COVID test is pending.    Take Tylenol as needed for fever or discomfort.  Rest and keep yourself hydrated.    Follow-up with your primary care provider if your symptoms are not improving.

## 2022-06-07 NOTE — ED Triage Notes (Signed)
Patient to Urgent Care with complaints of nasal congestion. Symptoms started on Thursday.   Denies any fevers.  Taking tylenol/ mucinex/ using nasal flushes.

## 2022-06-07 NOTE — ED Provider Notes (Signed)
Kari Rice    CSN: [id number] Arrival date & time: 06/07/22  1048      History   Chief Complaint Chief Complaint  Patient presents with   Nasal Congestion    HPI Kari Rice is a 47 y.o. female.  Patient presents with 3 day history of nasal congestion, runny nose, postnasal drip, mild cough.  Treating symptoms with Mucinex, nasal flush, Tylenol.  She denies fever, chills, ear pain, sore throat, shortness of breath, vomiting, diarrhea, or other symptoms.      The history is provided by the patient and medical records.    Past Medical History:  Diagnosis Date   Arthritis    right hand   DVT (deep venous thrombosis) ([place]) 2013   GERD (gastroesophageal reflux disease)    PONV (postoperative nausea and vomiting)    after cholecystectomy   Sleep apnea    CPAP   Varicose veins     Patient Active Problem List   Diagnosis Date Noted   Acquired trigger finger 02/26/2022   Obesity (BMI 30-39.9) 12/16/2021   OSA (obstructive sleep apnea) 12/03/2021   Sigmoid diverticulosis    Colon cancer screening 04/18/2021   Ear discomfort, right 04/18/2021   Dizziness 08/28/2020   Dysthymia 12/26/2019   Fibroid uterus 12/26/2019   Right leg swelling 12/11/2019   Fungal nail infection 10/11/2019   Contraception management 10/11/2019   Great toe pain, right 12/05/2018   Patellofemoral stress syndrome 07/20/2018   Left anterior knee pain 04/26/2018   Injury of left toe 01/25/2018   Left hand paresthesia 11/22/2017   GERD (gastroesophageal reflux disease) 11/22/2017   H/O vaginitis 08/10/2017   Bell's palsy 07/07/2017   Headache 07/07/2017   Hair loss 03/25/2016   Vitamin D deficiency 01/15/2016   Routine general medical examination at a health care facility 05/29/2014   Encounter for routine gynecological examination 05/29/2014   Screening for HIV (human immunodeficiency virus) 05/29/2014   Encounter for screening mammogram for breast cancer 05/29/2014   Acute  sinusitis 05/02/2014   Joint pain 04/10/2014   History of DVT (deep vein thrombosis) 07/06/2012   Varicose veins of lower extremities with other complications [id number]   Venous (peripheral) insufficiency 12/23/2011   Phlebitis and thrombophlebitis of the leg 11/06/2011   Venous insufficiency, peripheral 10/26/2011   Pedal edema 10/26/2011   IRREGULAR MENSES 02/29/2008   TRICHOMONAL VAGINITIS 03/18/2007    Past Surgical History:  Procedure Laterality Date   CHOLECYSTECTOMY  Feb. 2006   Gall Bladder   COLONOSCOPY WITH PROPOFOL N/A 05/23/2021   Procedure: COLONOSCOPY WITH PROPOFOL;  Surgeon: Lin Landsman, MD;  Location: [facility] ENDOSCOPY;  Service: Gastroenterology;  Laterality: N/A;   HAND SURGERY  2016   NASAL SEPTOPLASTY W/ TURBINOPLASTY Bilateral 10/01/2017   Procedure: NASAL SEPTOPLASTY WITH SUBMUCOCELE RESECTION OF TURBINATE;  Surgeon: Beverly Gust, MD;  Location: [place];  Service: ENT;  Laterality: Bilateral;  Sleep apnea   ROBOTIC ASSISTED LAPAROSCOPIC HYSTERECTOMY AND SALPINGECTOMY Bilateral 03/13/2020   Procedure: XI ROBOTIC ASSISTED LAPAROSCOPIC HYSTERECTOMY AND SALPINGECTOMY;  Surgeon: Homero Fellers, MD;  Location: [facility] ORS;  Service: Gynecology;  Laterality: Bilateral;   THROMBECTOMY Right 01/13/12 and  01/21/12   Right iliac vein stent and Vein thrombosis    OB History     Gravida  0   Para  0   Term  0   Preterm  0   AB  0   Living  0      SAB  0  IAB  0   Ectopic  0   Multiple  0   Live Births  0            Home Medications    Prior to Admission medications   Medication Sig Start Date End Date Taking? Authorizing Provider  predniSONE (STERAPRED UNI-PAK 21 TAB) 10 MG (21) TBPK tablet Take by mouth daily. As directed 06/07/22  Yes Sharion Balloon, NP  acetaminophen (TYLENOL) 650 MG CR tablet Take 1,300 mg by mouth every 8 (eight) hours as needed for pain.    [provider]  b complex vitamins capsule Take 1  capsule by mouth daily.    [provider]  benzonatate (TESSALON) 100 MG capsule Take 200 mg by mouth every 8 (eight) hours as needed. 02/10/22   [provider]  Cholecalciferol (VITAMIN D3) 2000 units TABS Take 1 tablet by mouth daily.    [provider]  FLUoxetine (PROZAC) 20 MG tablet Take 1 tablet (20 mg total) by mouth daily. 05/12/22   Tower, Wynelle Fanny, MD  fluticasone (FLONASE) 50 MCG/ACT nasal spray Place into both nostrils. 02/10/22   [provider]  meloxicam (MOBIC) 15 MG tablet Take 1 tablet (15 mg total) by mouth daily as needed for pain. With food 03/24/22   Tower, Wynelle Fanny, MD  NON FORMULARY Support hose to the waist for dx of venous insufficiency and edema 15-20 mm Hg    [provider]  omeprazole (PRILOSEC) 20 MG capsule Take 20 mg by mouth as needed.    [provider]    Family History Family History  Problem Relation Age of Onset   Arthritis Mother    Cancer Mother        uterine    Heart disease Father        Heart Disease before age 53   Cancer Father        skin    Hypertension Father    Heart attack Father     Social History Social History   Tobacco Use   Smoking status: Never    Passive exposure: Past   Smokeless tobacco: Never  Vaping Use   Vaping Use: Never used  Substance Use Topics   Alcohol use: No    Alcohol/week: 0.0 standard drinks of alcohol   Drug use: No     Allergies   Penicillins and Sulfa antibiotics   Review of Systems Review of Systems  Constitutional:  Negative for chills and fever.  HENT:  Positive for congestion, postnasal drip and rhinorrhea. Negative for ear pain and sore throat.   Respiratory:  Positive for cough. Negative for shortness of breath.   Cardiovascular:  Negative for chest pain and palpitations.  Gastrointestinal:  Negative for diarrhea and vomiting.  Skin:  Negative for color change and rash.  All other systems reviewed and are  negative.    Physical Exam Triage Vital Signs ED Triage Vitals [06/07/22 1126]  Enc Vitals Group     BP      Pulse Rate 80     Resp 18     Temp 98.2 F (36.8 C)     Temp src      SpO2 96 %     Weight      Height      Head Circumference      Peak Flow      Pain Score      Pain Loc      Pain Edu?  Excl. in [place]?    No data found.  Updated Vital Signs BP 126/78   Pulse 80   Temp 98.2 F (36.8 C)   Resp 18   LMP  (LMP Unknown)   SpO2 96%   Visual Acuity Right Eye Distance:   Left Eye Distance:   Bilateral Distance:    Right Eye Near:   Left Eye Near:    Bilateral Near:     Physical Exam Vitals and nursing note reviewed.  Constitutional:      General: She is not in acute distress.    Appearance: She is well-developed. She is not ill-appearing.  HENT:     Right Ear: Tympanic membrane normal.     Left Ear: Tympanic membrane normal.     Nose: Congestion and rhinorrhea present.     Mouth/Throat:     Mouth: Mucous membranes are moist.     Pharynx: Oropharynx is clear.  Cardiovascular:     Rate and Rhythm: Normal rate and regular rhythm.     Heart sounds: Normal heart sounds.  Pulmonary:     Effort: Pulmonary effort is normal. No respiratory distress.     Breath sounds: Normal breath sounds.  Musculoskeletal:     Cervical back: Neck supple.  Skin:    General: Skin is warm and dry.  Neurological:     Mental Status: She is alert.  Psychiatric:        Mood and Affect: Mood normal.        Behavior: Behavior normal.      UC Treatments / Results  Labs (all labs ordered are listed, but only abnormal results are displayed) Labs Reviewed  SARS CORONAVIRUS 2 (TAT 6-24 HRS)    EKG   Radiology No results found.  Procedures Procedures (including critical care time)  Medications Ordered in UC Medications - No data to display  Initial Impression / Assessment and Plan / UC Course  I have reviewed the triage vital signs and the nursing  notes.  Pertinent labs & imaging results that were available during my care of the patient were reviewed by me and considered in my medical decision making (see chart for details).   Viral URI, sinus congestion, allergic rhinitis.  COVID pending.  If COVID positive, recommend treatment with molnupiravir.  Treating today with prednisone taper.  Discussed symptomatic treatment including Tylenol, rest, hydration.  Instructed patient to follow up with her PCP if symptoms are not improving.  She agrees to plan of care.    Final Clinical Impressions(s) / UC Diagnoses   Final diagnoses:  Viral URI  Sinus congestion  Allergic rhinitis, unspecified seasonality, unspecified trigger     Discharge Instructions      Take the prednisone as directed.    Your COVID test is pending.    Take Tylenol as needed for fever or discomfort.  Rest and keep yourself hydrated.    Follow-up with your primary care provider if your symptoms are not improving.         ED Prescriptions     Medication Sig Dispense Auth. Provider   predniSONE (STERAPRED UNI-PAK 21 TAB) 10 MG (21) TBPK tablet Take by mouth daily. As directed 21 tablet Sharion Balloon, NP      PDMP not reviewed this encounter.   Sharion Balloon, NP 06/07/22 [phone]

## 2022-06-08 ENCOUNTER — Ambulatory Visit: Payer: Self-pay

## 2022-06-08 ENCOUNTER — Telehealth (HOSPITAL_COMMUNITY): Payer: Self-pay | Admitting: Emergency Medicine

## 2022-06-08 LAB — SARS CORONAVIRUS 2 (TAT 6-24 HRS): SARS Coronavirus 2: POSITIVE — AB

## 2022-06-08 MED ORDER — MOLNUPIRAVIR EUA 200MG CAPSULE: 4.0000 | ORAL_CAPSULE | Freq: Two times a day (BID) | ORAL | 0 refills | Status: AC

## 2022-06-18 ENCOUNTER — Encounter: Payer: Self-pay | Admitting: Family Medicine

## 2022-06-18 ENCOUNTER — Ambulatory Visit: Payer: Managed Care, Other (non HMO) | Admitting: Family Medicine

## 2022-06-18 VITALS — BP 118/80 | HR 72 | Temp 97.6°F | Ht 64.0 in | Wt 235.4 lb

## 2022-06-18 DIAGNOSIS — R8290 Unspecified abnormal findings in urine: Secondary | ICD-10-CM

## 2022-06-18 DIAGNOSIS — R1030 Lower abdominal pain, unspecified: Secondary | ICD-10-CM | POA: Diagnosis not present

## 2022-06-18 LAB — POC URINALSYSI DIPSTICK (AUTOMATED)
Bilirubin, UA: NEGATIVE
Blood, UA: 50 — AB
Glucose, UA: NEGATIVE
Ketones, UA: NEGATIVE
Leukocytes, UA: NEGATIVE
Nitrite, UA: NEGATIVE
Protein, UA: POSITIVE — AB
Spec Grav, UA: 1.02 (ref 1.010–1.025)
Urobilinogen, UA: 0.2 E.U./dL
pH, UA: 6 (ref 5.0–8.0)

## 2022-06-18 LAB — POCT UA - MICROSCOPIC ONLY

## 2022-06-18 NOTE — Assessment & Plan Note (Addendum)
Intermittent/crampy and usually midline in pt with partial hyst in the past Mild tenderness today  Ua is pos-culture pending (no voiding symptoms) No stool changes  Labs ordered Noted h/o diverticulosis -inst to avoid nuts/seeds/corn and try fiber supplement  Disc ER precautions in detail Call if new symptoms  Consider imaging or GYN eval if needed   Rev last gyn notes Rev last colonoscopy report

## 2022-06-18 NOTE — Assessment & Plan Note (Signed)
Today some rbc, wbc, bact on micro No voiding symptoms but does have low abd cramping  Culture pending

## 2022-06-18 NOTE — Patient Instructions (Addendum)
Let us know if symptoms worsen /if severe go to the ER   Drink fluids Try a fiber supplement daily  For now -avoid nuts and seeds and popcorn  Ua and labs today   We will make a plan from there

## 2022-06-18 NOTE — Progress Notes (Signed)
Subjective:    Patient ID: Kari Rice, female    DOB: Jun 28, 1975, 47 y.o.   MRN: [id number]  HPI Pt presents for low abd pain and cramping  Wt Readings from Last 3 Encounters:  06/18/22 235 lb 6 oz (106.8 kg)  03/24/22 236 lb 2 oz (107.1 kg)  02/18/22 232 lb 8 oz (105.5 kg)   40.40 kg/m  Vitals:   06/18/22 0820  BP: 118/80  Pulse: 72  Temp: 97.6 F (36.4 C)  SpO2: 95%   Symptoms started a few weeks ago  Had covid  Took molnupiravir and prednisone   Cramping  On and off  In the middle   (one time it moved from left to middle)  Hits worse mid day 1-2 pm  Does lift and work physically at job   One time she was slt nauseated - brief/fleeting No vomiting  No diarrhea or constipation  No blood in stool   Nl amt of gas  No fever   Diet has not changed  Eats pop chips (no kernels) Ate some fig butter with some seeds about a month ago  Everything bagel - poppy seeds/sesame    No urinary symptoms at all    H/o sigmoid diverticulosis  Colonoscopy 05/2021   no polyps   Has had a hysterectomy - still has overies Occ still feels crampy    H/o GERD' omeprazole 20 mg as neede  Results for orders placed or performed in visit on 06/18/22  POCT Urinalysis Dipstick (Automated)  Result Value Ref Range   Color, UA Yellow    Clarity, UA Hazy    Glucose, UA Negative Negative   Bilirubin, UA Negative    Ketones, UA Negative    Spec Grav, UA 1.020 1.010 - 1.025   Blood, UA 50 Ery/uL (A)    pH, UA 6.0 5.0 - 8.0   Protein, UA Positive (A) Negative   Urobilinogen, UA 0.2 0.2 or 1.0 E.U./dL   Nitrite, UA Negative    Leukocytes, UA Negative Negative  POCT UA - Microscopic Only  Result Value Ref Range   WBC, Ur, HPF, POC 3-5 0 - 5   RBC, Urine, Miroscopic 1-3 0 - 2   Bacteria, U Microscopic many None - Trace   Mucus, UA few    Epithelial cells, urine per micros mod    Crystals, Ur, HPF, POC none    Casts, Ur, LPF, POC none    Yeast, UA none      Patient  Active Problem List   Diagnosis Date Noted   Lower abdominal pain 06/18/2022   Abnormal urinalysis 06/18/2022   Acquired trigger finger 02/26/2022   Obesity (BMI 30-39.9) 12/16/2021   OSA (obstructive sleep apnea) 12/03/2021   Sigmoid diverticulosis    Colon cancer screening 04/18/2021   Dysthymia 12/26/2019   Right leg swelling 12/11/2019   Fungal nail infection 10/11/2019   Patellofemoral stress syndrome 07/20/2018   Left anterior knee pain 04/26/2018   Injury of left toe 01/25/2018   Left hand paresthesia 11/22/2017   GERD (gastroesophageal reflux disease) 11/22/2017   H/O vaginitis 08/10/2017   Bell's palsy 07/07/2017   Headache 07/07/2017   Hair loss 03/25/2016   Vitamin D deficiency 01/15/2016   Routine general medical examination at a health care facility 05/29/2014   Encounter for routine gynecological examination 05/29/2014   Screening for HIV (human immunodeficiency virus) 05/29/2014   Encounter for screening mammogram for breast cancer 05/29/2014   Acute sinusitis 05/02/2014  Joint pain 04/10/2014   History of DVT (deep vein thrombosis) 07/06/2012   Varicose veins of lower extremities with other complications [id number]   Venous (peripheral) insufficiency 12/23/2011   Phlebitis and thrombophlebitis of the leg 11/06/2011   Venous insufficiency, peripheral 10/26/2011   Pedal edema 10/26/2011   IRREGULAR MENSES 02/29/2008   TRICHOMONAL VAGINITIS 03/18/2007   Past Medical History:  Diagnosis Date   Arthritis    right hand   DVT (deep venous thrombosis) ([place]) 2013   GERD (gastroesophageal reflux disease)    PONV (postoperative nausea and vomiting)    after cholecystectomy   Sleep apnea    CPAP   Varicose veins    Past Surgical History:  Procedure Laterality Date   CHOLECYSTECTOMY  Feb. 2006   Gall Bladder   COLONOSCOPY WITH PROPOFOL N/A 05/23/2021   Procedure: COLONOSCOPY WITH PROPOFOL;  Surgeon: Lin Landsman, MD;  Location[facility] ENDOSCOPY;  Service:  Gastroenterology;  Laterality: N/A;   HAND SURGERY  2016   NASAL SEPTOPLASTY W/ TURBINOPLASTY Bilateral 10/01/2017   Procedure: NASAL SEPTOPLASTY WITH SUBMUCOCELE RESECTION OF TURBINATE;  Surgeon: Beverly Gust, MD;  Location: [place];  Service: ENT;  Laterality: Bilateral;  Sleep apnea   ROBOTIC ASSISTED LAPAROSCOPIC HYSTERECTOMY AND SALPINGECTOMY Bilateral 03/13/2020   Procedure: XI ROBOTIC ASSISTED LAPAROSCOPIC HYSTERECTOMY AND SALPINGECTOMY;  Surgeon: Homero Fellers, MD;  Location: [facility] ORS;  Service: Gynecology;  Laterality: Bilateral;   THROMBECTOMY Right 01/13/12 and  01/21/12   Right iliac vein stent and Vein thrombosis   Social History   Tobacco Use   Smoking status: Never    Passive exposure: Past   Smokeless tobacco: Never  Vaping Use   Vaping Use: Never used  Substance Use Topics   Alcohol use: No    Alcohol/week: 0.0 standard drinks of alcohol   Drug use: No   Family History  Problem Relation Age of Onset   Arthritis Mother    Cancer Mother        uterine    Heart disease Father        Heart Disease before age 68   Cancer Father        skin    Hypertension Father    Heart attack Father    Allergies  Allergen Reactions   Penicillins Rash   Sulfa Antibiotics Rash   Current Outpatient Medications on File Prior to Visit  Medication Sig Dispense Refill   acetaminophen (TYLENOL) 650 MG CR tablet Take 1,300 mg by mouth every 8 (eight) hours as needed for pain.     b complex vitamins capsule Take 1 capsule by mouth daily.     Cholecalciferol (VITAMIN D3) 2000 units TABS Take 1 tablet by mouth daily.     FLUoxetine (PROZAC) 20 MG tablet Take 1 tablet (20 mg total) by mouth daily. 90 tablet 3   meloxicam (MOBIC) 15 MG tablet Take 1 tablet (15 mg total) by mouth daily as needed for pain. With food 30 tablet 1   NON FORMULARY Support hose to the waist for dx of venous insufficiency and edema 15-20 mm Hg     omeprazole (PRILOSEC) 20 MG capsule Take  20 mg by mouth as needed.     No current facility-administered medications on file prior to visit.    Review of Systems  Constitutional:  Negative for activity change, appetite change, fatigue, fever and unexpected weight change.  HENT:  Negative for congestion, ear pain, rhinorrhea, sinus pressure and sore throat.   Eyes:  Negative for pain, redness and visual disturbance.  Respiratory:  Negative for cough, shortness of breath and wheezing.   Cardiovascular:  Negative for chest pain and palpitations.  Gastrointestinal:  Negative for abdominal distention, abdominal pain, anal bleeding, blood in stool, constipation, diarrhea, nausea, rectal pain and vomiting.  Endocrine: Negative for polydipsia and polyuria.  Genitourinary:  Negative for decreased urine volume, difficulty urinating, dysuria, frequency, urgency, vaginal bleeding, vaginal discharge and vaginal pain.  Musculoskeletal:  Negative for arthralgias, back pain and myalgias.  Skin:  Negative for pallor and rash.  Allergic/Immunologic: Negative for environmental allergies.  Neurological:  Negative for dizziness, syncope and headaches.  Hematological:  Negative for adenopathy. Does not bruise/bleed easily.  Psychiatric/Behavioral:  Negative for decreased concentration and dysphoric mood. The patient is not nervous/anxious.        Objective:   Physical Exam Constitutional:      General: She is not in acute distress.    Appearance: She is well-developed. She is obese. She is not ill-appearing or diaphoretic.  HENT:     Head: Normocephalic and atraumatic.  Eyes:     Conjunctiva/sclera: Conjunctivae normal.     Pupils: Pupils are equal, round, and reactive to light.  Neck:     Thyroid: No thyromegaly.     Vascular: No carotid bruit or JVD.  Cardiovascular:     Rate and Rhythm: Normal rate and regular rhythm.     Heart sounds: Normal heart sounds.     No gallop.  Pulmonary:     Effort: Pulmonary effort is normal. No respiratory  distress.     Breath sounds: Normal breath sounds. No wheezing or rales.  Abdominal:     General: Abdomen is protuberant. Bowel sounds are normal. There is no distension or abdominal bruit.     Palpations: Abdomen is soft. There is no hepatomegaly, splenomegaly, mass or pulsatile mass.     Tenderness: There is abdominal tenderness in the suprapubic area. There is no right CVA tenderness, left CVA tenderness, guarding or rebound. Negative signs include Murphy's sign and McBurney's sign.     Hernia: No hernia is present.  Musculoskeletal:     Cervical back: Normal range of motion and neck supple.     Right lower leg: No edema.     Left lower leg: No edema.  Lymphadenopathy:     Cervical: No cervical adenopathy.  Skin:    General: Skin is warm and dry.     Coloration: Skin is not pale.     Findings: No rash.  Neurological:     Mental Status: She is alert.     Motor: No weakness.     Coordination: Coordination normal.     Deep Tendon Reflexes: Reflexes are normal and symmetric. Reflexes normal.  Psychiatric:        Mood and Affect: Mood normal.           Assessment & Plan:   Problem List Items Addressed This Visit       Other   Abnormal urinalysis    Today some rbc, wbc, bact on micro No voiding symptoms but does have low abd cramping  Culture pending       Relevant Orders   POCT UA - Microscopic Only (Completed)   Urine Culture   Lower abdominal pain - Primary    Intermittent/crampy and usually midline in pt with partial hyst in the past Mild tenderness today  Ua is pos-culture pending (no voiding symptoms) No stool changes  Labs ordered Noted  h/o diverticulosis -inst to avoid nuts/seeds/corn and try fiber supplement  Disc ER precautions in detail Call if new symptoms  Consider imaging or GYN eval if needed   Rev last gyn notes Rev last colonoscopy report       Relevant Orders   POCT Urinalysis Dipstick (Automated) (Completed)   CBC with  Differential/Platelet   Comprehensive metabolic panel   Lipase   POCT UA - Microscopic Only (Completed)   Urine Culture

## 2022-06-19 LAB — CBC WITH DIFFERENTIAL/PLATELET
Basophils Absolute: 0.1 10*3/uL (ref 0.0–0.2)
Basos: 1 %
EOS (ABSOLUTE): 0.3 10*3/uL (ref 0.0–0.4)
Eos: 3 %
Hematocrit: 42.2 % (ref 34.0–46.6)
Hemoglobin: 14.1 g/dL (ref 11.1–15.9)
Immature Grans (Abs): 0.1 10*3/uL (ref 0.0–0.1)
Immature Granulocytes: 1 %
Lymphocytes Absolute: 3.6 10*3/uL — ABNORMAL HIGH (ref 0.7–3.1)
Lymphs: 36 %
MCH: 29.9 pg (ref 26.6–33.0)
MCHC: 33.4 g/dL (ref 31.5–35.7)
MCV: 89 fL (ref 79–97)
Monocytes Absolute: 0.8 10*3/uL (ref 0.1–0.9)
Monocytes: 8 %
Neutrophils Absolute: 5.2 10*3/uL (ref 1.4–7.0)
Neutrophils: 51 %
Platelets: 284 10*3/uL (ref 150–450)
RBC: 4.72 x10E6/uL (ref 3.77–5.28)
RDW: 12.7 % (ref 11.7–15.4)
WBC: 10.1 10*3/uL (ref 3.4–10.8)

## 2022-06-19 LAB — COMPREHENSIVE METABOLIC PANEL
ALT: 17 IU/L (ref 0–32)
AST: 16 IU/L (ref 0–40)
Albumin/Globulin Ratio: 1.8 (ref 1.2–2.2)
Albumin: 4.4 g/dL (ref 3.9–4.9)
Alkaline Phosphatase: 88 IU/L (ref 44–121)
BUN/Creatinine Ratio: 13 (ref 9–23)
BUN: 9 mg/dL (ref 6–24)
Bilirubin Total: 0.5 mg/dL (ref 0.0–1.2)
CO2: 25 mmol/L (ref 20–29)
Calcium: 10 mg/dL (ref 8.7–10.2)
Chloride: 99 mmol/L (ref 96–106)
Creatinine, Ser: 0.68 mg/dL (ref 0.57–1.00)
Globulin, Total: 2.5 g/dL (ref 1.5–4.5)
Glucose: 83 mg/dL (ref 70–99)
Potassium: 4.2 mmol/L (ref 3.5–5.2)
Sodium: 139 mmol/L (ref 134–144)
Total Protein: 6.9 g/dL (ref 6.0–8.5)
eGFR: 107 mL/min/{1.73_m2} (ref >59)

## 2022-06-19 LAB — LIPASE: Lipase: 26 U/L (ref 14–72)

## 2022-06-20 LAB — URINE CULTURE

## 2022-08-13 ENCOUNTER — Ambulatory Visit
Admission: RE | Admit: 2022-08-13 | Discharge: 2022-08-13 | Disposition: A | Discharge status: Home / Self Care | Payer: Managed Care, Other (non HMO) | Source: Ambulatory Visit | Attending: Emergency Medicine | Admitting: Emergency Medicine

## 2022-08-13 VITALS — BP 124/82 | HR 79 | Temp 98.2°F | Resp 16

## 2022-08-13 DIAGNOSIS — R062 Wheezing: Secondary | ICD-10-CM

## 2022-08-13 DIAGNOSIS — J069 Acute upper respiratory infection, unspecified: Secondary | ICD-10-CM

## 2022-08-13 MED ORDER — PROMETHAZINE-DM 6.25-15 MG/5ML PO SYRP: 5.0000 mL | ORAL_SOLUTION | Freq: Every evening | ORAL | 0 refills | Status: DC | PRN

## 2022-08-13 MED ORDER — BENZONATATE 100 MG PO CAPS: 100.0000 mg | ORAL_CAPSULE | Freq: Three times a day (TID) | ORAL | 0 refills | Status: DC

## 2022-08-13 MED ORDER — PREDNISONE 20 MG PO TABS: 40.0000 mg | ORAL_TABLET | Freq: Every day | ORAL | 0 refills | Status: DC

## 2022-08-13 NOTE — Discharge Instructions (Signed)
Your symptoms today are most likely being caused by a virus and should steadily improve in time it can take up to 7 to 10 days before you truly start to see a turnaround however things will get better  Begin prednisone every morning with food for 5 days to help reduce and relax the airway should help calm the harshness of cough and wheezing  You may use Tessalon pill every 8 hours as needed to help calm your coughing  Additionally you may use cough syrup at bedtime as needed, be mindful of this and can makeYou can take Tylenol and/or Ibuprofen as needed for fever reduction and pain relief.   For cough: honey 1/2 to 1 teaspoon (you can dilute the honey in water or another fluid).  You can also use guaifenesin and dextromethorphan for cough. You can use a humidifier for chest congestion and cough.  If you don't have a humidifier, you can sit in the bathroom with the hot shower running.      For sore throat: try warm salt water gargles, cepacol lozenges, throat spray, warm tea or water with lemon/honey, popsicles or ice, or OTC cold relief medicine for throat discomfort.   For congestion: take a daily anti-histamine like Zyrtec, Claritin, and a oral decongestant, such as pseudoephedrine.  You can also use Flonase 1-2 sprays in each nostril daily.   It is important to stay hydrated: drink plenty of fluids (water, gatorade/powerade/pedialyte, juices, or teas) to keep your throat moisturized and help further relieve irritation/discomfort.

## 2022-08-13 NOTE — ED Triage Notes (Signed)
Patient presents to UC for wheezing, cough, chest tightness since Saturday. Dizziness, HA since today. Not taking any OTC meds.

## 2022-08-13 NOTE — ED Provider Notes (Signed)
Kari Rice    CSN: [id number] Arrival date & time: 08/13/22  1509      History   Chief Complaint Chief Complaint  Patient presents with   Headache    I have some coughing with slight wheezing in chest and nasal area. Slight dizziness at times. - Entered by patient   Dizziness   Cough   Wheezing    HPI Kari Rice is a 47 y.o. female.   Patient presents for evaluation of nasal congestion, rhinorrhea, nonproductive cough, chest tightness and wheezing present 6 days.  Has begun to experience a generalized headache today.  Wheezing is predominantly at nighttime.  No known sick contact prior.  Has not attempted treatment of symptoms.  Denies respiratory history.  Denies fever  Past Medical History:  Diagnosis Date   Arthritis    right hand   DVT (deep venous thrombosis) ([facility]) 2013   GERD (gastroesophageal reflux disease)    PONV (postoperative nausea and vomiting)    after cholecystectomy   Sleep apnea    CPAP   Varicose veins     Patient Active Problem List   Diagnosis Date Noted   Lower abdominal pain 06/18/2022   Abnormal urinalysis 06/18/2022   Acquired trigger finger 02/26/2022   Obesity (BMI 30-39.9) 12/16/2021   OSA (obstructive sleep apnea) 12/03/2021   Sigmoid diverticulosis    Colon cancer screening 04/18/2021   Dysthymia 12/26/2019   Right leg swelling 12/11/2019   Fungal nail infection 10/11/2019   Patellofemoral stress syndrome 07/20/2018   Left anterior knee pain 04/26/2018   Injury of left toe 01/25/2018   Left hand paresthesia 11/22/2017   GERD (gastroesophageal reflux disease) 11/22/2017   H/O vaginitis 08/10/2017   Bell's palsy 07/07/2017   Headache 07/07/2017   Hair loss 03/25/2016   Vitamin D deficiency 01/15/2016   Routine general medical examination at a health care facility 05/29/2014   Encounter for routine gynecological examination 05/29/2014   Screening for HIV (human immunodeficiency virus) 05/29/2014   Encounter for  screening mammogram for breast cancer 05/29/2014   Acute sinusitis 05/02/2014   Joint pain 04/10/2014   History of DVT (deep vein thrombosis) 07/06/2012   Varicose veins of lower extremities with other complications 07/06/2012   Venous (peripheral) insufficiency 12/23/2011   Phlebitis and thrombophlebitis of the leg 11/06/2011   Venous insufficiency, peripheral 10/26/2011   Pedal edema 10/26/2011   IRREGULAR MENSES 02/29/2008   TRICHOMONAL VAGINITIS 03/18/2007    Past Surgical History:  Procedure Laterality Date   CHOLECYSTECTOMY  Feb. 2006   Gall Bladder   COLONOSCOPY WITH PROPOFOL N/A 05/23/2021   Procedure: COLONOSCOPY WITH PROPOFOL;  Surgeon: Toney Reil, MD;  Location: [facility] ENDOSCOPY;  Service: Gastroenterology;  Laterality: N/A;   HAND SURGERY  2016   NASAL SEPTOPLASTY W/ TURBINOPLASTY Bilateral 10/01/2017   Procedure: NASAL SEPTOPLASTY WITH SUBMUCOCELE RESECTION OF TURBINATE;  Surgeon: Linus Salmons, MD;  Location: [facility];  Service: ENT;  Laterality: Bilateral;  Sleep apnea   ROBOTIC ASSISTED LAPAROSCOPIC HYSTERECTOMY AND SALPINGECTOMY Bilateral 03/13/2020   Procedure: XI ROBOTIC ASSISTED LAPAROSCOPIC HYSTERECTOMY AND SALPINGECTOMY;  Surgeon: Natale Milch, MD;  Location: [facility] ORS;  Service: Gynecology;  Laterality: Bilateral;   THROMBECTOMY Right 01/13/12 and  01/21/12   Right iliac vein stent and Vein thrombosis    OB History     Gravida  0   Para  0   Term  0   Preterm  0   AB  0   Living  0      SAB  0   IAB  0   Ectopic  0   Multiple  0   Live Births  0            Home Medications    Prior to Admission medications   Medication Sig Start Date End Date Taking? Authorizing Provider  benzonatate (TESSALON) 100 MG capsule Take 1 capsule (100 mg total) by mouth every 8 (eight) hours. 08/13/22  Yes Kimbrely Buckel R, NP  predniSONE (DELTASONE) 20 MG tablet Take 2 tablets (40 mg total) by mouth daily. 08/13/22  Yes Ahlayah Tarkowski,  Cydne Grahn R, NP  promethazine-dextromethorphan (PROMETHAZINE-DM) 6.25-15 MG/5ML syrup Take 5 mLs by mouth at bedtime as needed for cough. 08/13/22  Yes Vander Kueker, Elita Boone, NP  acetaminophen (TYLENOL) 650 MG CR tablet Take 1,300 mg by mouth every 8 (eight) hours as needed for pain.    [provider]  b complex vitamins capsule Take 1 capsule by mouth daily.    [provider]  Cholecalciferol (VITAMIN D3) 2000 units TABS Take 1 tablet by mouth daily.    [provider]  FLUoxetine (PROZAC) 20 MG tablet Take 1 tablet (20 mg total) by mouth daily. 05/12/22   Tower, Audrie Gallus, MD  meloxicam (MOBIC) 15 MG tablet Take 1 tablet (15 mg total) by mouth daily as needed for pain. With food 03/24/22   Tower, Audrie Gallus, MD  NON FORMULARY Support hose to the waist for dx of venous insufficiency and edema 15-20 mm Hg    [provider]  omeprazole (PRILOSEC) 20 MG capsule Take 20 mg by mouth as needed.    [provider]    Family History Family History  Problem Relation Age of Onset   Arthritis Mother    Cancer Mother        uterine    Heart disease Father        Heart Disease before age 81   Cancer Father        skin    Hypertension Father    Heart attack Father     Social History Social History   Tobacco Use   Smoking status: Never    Passive exposure: Past   Smokeless tobacco: Never  Vaping Use   Vaping Use: Never used  Substance Use Topics   Alcohol use: No    Alcohol/week: 0.0 standard drinks of alcohol   Drug use: No     Allergies   Penicillins and Sulfa antibiotics   Review of Systems Review of Systems  Respiratory:  Positive for cough and wheezing.   Neurological:  Positive for dizziness and headaches.     Physical Exam Triage Vital Signs ED Triage Vitals  Enc Vitals Group     BP 08/13/22 1520 124/82     Pulse Rate 08/13/22 1520 79     Resp 08/13/22 1520 16     Temp 08/13/22 1520 98.2 F (36.8 C)     Temp Source 08/13/22  1520 Oral     SpO2 08/13/22 1520 95 %     Weight --      Height --      Head Circumference --      Peak Flow --      Pain Score 08/13/22 1528 0     Pain Loc --      Pain Edu? --      Excl. in GC? --    No data found.  Updated Vital Signs BP  124/82 (BP Location: Left Arm)   Pulse 79   Temp 98.2 F (36.8 C) (Oral)   Resp 16   LMP  (LMP Unknown)   SpO2 95%   Visual Acuity Right Eye Distance:   Left Eye Distance:   Bilateral Distance:    Right Eye Near:   Left Eye Near:    Bilateral Near:     Physical Exam Constitutional:      Appearance: Normal appearance. She is well-developed.  HENT:     Head: Normocephalic.     Right Ear: Tympanic membrane, ear canal and external ear normal.     Left Ear: Tympanic membrane, ear canal and external ear normal.     Nose: Congestion and rhinorrhea present.     Mouth/Throat:     Mouth: Mucous membranes are moist.     Pharynx: Oropharynx is clear.  Eyes:     Extraocular Movements: Extraocular movements intact.  Cardiovascular:     Rate and Rhythm: Normal rate and regular rhythm.     Pulses: Normal pulses.     Heart sounds: Normal heart sounds.  Pulmonary:     Effort: Pulmonary effort is normal.     Breath sounds: Wheezing present.  Neurological:     Mental Status: She is alert and oriented to person, place, and time. Mental status is at baseline.      UC Treatments / Results  Labs (all labs ordered are listed, but only abnormal results are displayed) Labs Reviewed - No data to display  EKG   Radiology No results found.  Procedures Procedures (including critical care time)  Medications Ordered in UC Medications - No data to display  Initial Impression / Assessment and Plan / UC Course  I have reviewed the triage vital signs and the nursing notes.  Pertinent labs & imaging results that were available during my care of the patient were reviewed by me and considered in my medical decision making (see chart for  details).  Viral URI with cough, wheezing  Vital signs are stable, O2 saturation 95% on room air, wheezing is heard to the bilateral upper lobes, lower lobes are clear, stable for outpatient management, etiology is most likely viral, prescribed prednisone, Tessalon and Promethazine DM for outpatient use may take additional over-the-counter medications as needed with for reevaluation Final Clinical Impressions(s) / UC Diagnoses   Final diagnoses:  Viral URI with cough  Wheezing     Discharge Instructions      Your symptoms today are most likely being caused by a virus and should steadily improve in time it can take up to 7 to 10 days before you truly start to see a turnaround however things will get better  Begin prednisone every morning with food for 5 days to help reduce and relax the airway should help calm the harshness of cough and wheezing  You may use Tessalon pill every 8 hours as needed to help calm your coughing  Additionally you may use cough syrup at bedtime as needed, be mindful of this and can makeYou can take Tylenol and/or Ibuprofen as needed for fever reduction and pain relief.   For cough: honey 1/2 to 1 teaspoon (you can dilute the honey in water or another fluid).  You can also use guaifenesin and dextromethorphan for cough. You can use a humidifier for chest congestion and cough.  If you don't have a humidifier, you can sit in the bathroom with the hot shower running.      For sore  throat: try warm salt water gargles, cepacol lozenges, throat spray, warm tea or water with lemon/honey, popsicles or ice, or OTC cold relief medicine for throat discomfort.   For congestion: take a daily anti-histamine like Zyrtec, Claritin, and a oral decongestant, such as pseudoephedrine.  You can also use Flonase 1-2 sprays in each nostril daily.   It is important to stay hydrated: drink plenty of fluids (water, gatorade/powerade/pedialyte, juices, or teas) to keep your throat  moisturized and help further relieve irritation/discomfort.    ED Prescriptions     Medication Sig Dispense Auth. Provider   promethazine-dextromethorphan (PROMETHAZINE-DM) 6.25-15 MG/5ML syrup Take 5 mLs by mouth at bedtime as needed for cough. 118 mL Doil Kamara R, NP   benzonatate (TESSALON) 100 MG capsule Take 1 capsule (100 mg total) by mouth every 8 (eight) hours. 21 capsule Diandra Cimini R, NP   predniSONE (DELTASONE) 20 MG tablet Take 2 tablets (40 mg total) by mouth daily. 10 tablet Valinda Hoar, NP      PDMP not reviewed this encounter.   Valinda Hoar, NP 08/13/22 1615

## 2022-08-19 ENCOUNTER — Other Ambulatory Visit: Payer: Self-pay | Admitting: Family Medicine

## 2022-08-19 NOTE — Telephone Encounter (Addendum)
Last office visit 06/18/22 Script sent in 05/12/22 #90/3 Spoke to patient by telephone and was advised that she does not need a refill at this time. Patient stated that she just picked up a refill yesterday.

## 2022-08-31 ENCOUNTER — Ambulatory Visit: Payer: Managed Care, Other (non HMO) | Admitting: Family Medicine

## 2022-08-31 VITALS — BP 126/70 | HR 66 | Temp 97.8°F | Ht 64.0 in | Wt 243.4 lb

## 2022-08-31 DIAGNOSIS — M25512 Pain in left shoulder: Secondary | ICD-10-CM | POA: Diagnosis not present

## 2022-08-31 MED ORDER — MELOXICAM 15 MG PO TABS: 15.0000 mg | ORAL_TABLET | Freq: Every day | ORAL | 1 refills | Status: DC | PRN

## 2022-08-31 NOTE — Progress Notes (Signed)
Subjective:    Patient ID: Kari Rice, female    DOB: [birth year], 47 y.o.   MRN: [id number]  HPI Pt presents for left sided arm pain in bicep area   Wt Readings from Last 3 Encounters:  08/31/22 243 lb 6 oz (110.4 kg)  06/18/22 235 lb 6 oz (106.8 kg)  03/24/22 236 lb 2 oz (107.1 kg)   41.78 kg/m  Vitals:   08/31/22 0912  BP: 126/70  Pulse: 66  Temp: 97.8 F (36.6 C)  SpO2: 96%   Has pain in her L upper arm  More burning and sharp than dull /not tingling   Had to leave work on Saturday  Below the shoulder -sometimes radiates up to shoulder area  Comes and goes  When it comes on it is constant  Sometimes positional  No swelling  Does not bother her at night  No injury (did help set up a computer with outstretched arms on sat am)    No position makes it better or worse  She stopped meloxicam after her shot   Tried some heat on it -not a lot of help  Has carpal tunnel  Symptoms come and go  Had injection at [facility]    Patient Active Problem List   Diagnosis Date Noted   Left shoulder pain 08/31/2022   Lower abdominal pain 06/18/2022   Abnormal urinalysis 06/18/2022   Acquired trigger finger 02/26/2022   Obesity (BMI 30-39.9) 12/16/2021   OSA (obstructive sleep apnea) 12/03/2021   Sigmoid diverticulosis    Colon cancer screening 04/18/2021   Dysthymia 12/26/2019   Right leg swelling 12/11/2019   Fungal nail infection 10/11/2019   Patellofemoral stress syndrome 07/20/2018   Left anterior knee pain 04/26/2018   Injury of left toe 01/25/2018   Left hand paresthesia 11/22/2017   GERD (gastroesophageal reflux disease) 11/22/2017   H/O vaginitis 08/10/2017   Headache 07/07/2017   Hair loss 03/25/2016   Vitamin D deficiency 01/15/2016   Routine general medical examination at a health care facility 05/29/2014   Encounter for routine gynecological examination 05/29/2014   Screening for HIV (human immunodeficiency virus) 05/29/2014   Encounter for  screening mammogram for breast cancer 05/29/2014   Joint pain 04/10/2014   History of DVT (deep vein thrombosis) 07/06/2012   Varicose veins of lower extremities with other complications 07/06/2012   Venous (peripheral) insufficiency 12/23/2011   Phlebitis and thrombophlebitis of the leg 11/06/2011   Venous insufficiency, peripheral 10/26/2011   Pedal edema 10/26/2011   IRREGULAR MENSES 02/29/2008   TRICHOMONAL VAGINITIS 03/18/2007   Past Medical History:  Diagnosis Date   Arthritis    right hand   DVT (deep venous thrombosis) (HCC) 2013   GERD (gastroesophageal reflux disease)    PONV (postoperative nausea and vomiting)    after cholecystectomy   Sleep apnea    CPAP   Varicose veins    Past Surgical History:  Procedure Laterality Date   CHOLECYSTECTOMY  Feb. 2006   Gall Bladder   COLONOSCOPY WITH PROPOFOL N/A 05/23/2021   Procedure: COLONOSCOPY WITH PROPOFOL;  Surgeon: Toney Reil, MD;  Location: [facility];  Service: Gastroenterology;  Laterality: N/A;   HAND SURGERY  2016   NASAL SEPTOPLASTY W/ TURBINOPLASTY Bilateral 10/01/2017   Procedure: NASAL SEPTOPLASTY WITH SUBMUCOCELE RESECTION OF TURBINATE;  Surgeon: Linus Salmons, MD;  Location: [facility];  Service: ENT;  Laterality: Bilateral;  Sleep apnea   ROBOTIC ASSISTED LAPAROSCOPIC HYSTERECTOMY AND SALPINGECTOMY Bilateral 03/13/2020   Procedure:  XI ROBOTIC ASSISTED LAPAROSCOPIC HYSTERECTOMY AND SALPINGECTOMY;  Surgeon: Natale Milch, MD;  Location: [facility] OR[facility];  Service: Gynecology;  Laterality: Bilateral;   THROMBECTOMY Right 01/13/12 and  01/21/12   Right iliac vein stent and Vein thrombosis   Social History   Tobacco Use   Smoking status: Never    Passive exposure: Past   Smokeless tobacco: Never  Vaping Use   Vaping Use: Never used  Substance Use Topics   Alcohol use: No    Alcohol/week: 0.0 standard drinks of alcohol   Drug use: No   Family History  Problem Relation Age of Onset    Arthritis Mother    Cancer Mother        uterine    Heart disease Father        Heart Disease before age 49   Cancer Father        skin    Hypertension Father    Heart attack Father    Allergies  Allergen Reactions   Penicillins Rash   Sulfa Antibiotics Rash   Current Outpatient Medications on File Prior to Visit  Medication Sig Dispense Refill   acetaminophen (TYLENOL) 650 MG CR tablet Take 1,300 mg by mouth every 8 (eight) hours as needed for pain.     b complex vitamins capsule Take 1 capsule by mouth daily.     benzonatate (TESSALON) 100 MG capsule Take 1 capsule (100 mg total) by mouth every 8 (eight) hours. 21 capsule 0   Cholecalciferol (VITAMIN D3) 2000 units TABS Take 1 tablet by mouth daily.     FLUoxetine (PROZAC) 20 MG tablet Take 1 tablet (20 mg total) by mouth daily. 90 tablet 3   NON FORMULARY Support hose to the waist for dx of venous insufficiency and edema 15-20 mm Hg     omeprazole (PRILOSEC) 20 MG capsule Take 20 mg by mouth as needed.     promethazine-dextromethorphan (PROMETHAZINE-DM) 6.25-15 MG/5ML syrup Take 5 mLs by mouth at bedtime as needed for cough. 118 mL 0   No current facility-administered medications on file prior to visit.      Review of Systems  Constitutional:  Negative for fatigue.  Musculoskeletal:  Positive for arthralgias. Negative for joint swelling.       Left shoulder pain   Neurological:  Negative for tremors, weakness and numbness.       Tingling in L hand is improved        Objective:   Physical Exam Constitutional:      General: She is not in acute distress.    Appearance: Normal appearance. She is obese. She is not ill-appearing.  Eyes:     Conjunctiva/sclera: Conjunctivae normal.     Pupils: Pupils are equal, round, and reactive to light.  Cardiovascular:     Rate and Rhythm: Normal rate and regular rhythm.  Musculoskeletal:        General: Tenderness present.     Cervical back: Normal range of motion and neck  supple.     Comments:  Left Shoulder  No deformity/swelling/warmth or erythema  No crepitus  No obvious effusion  Abduction full with some discomfort at full endpont Hawking test positive for pain in ant shoulder and deltoid Neer test -positive for deltoid pain Internal rotation -painful  External rotation -normal  Tenderness -slight over acromion and bicep tendon Normal grip and hand dexterity     Lymphadenopathy:     Cervical: No cervical adenopathy.  Skin:    General: Skin is  warm and dry.     Coloration: Skin is not pale.     Findings: No bruising, erythema or rash.  Psychiatric:        Mood and Affect: Mood normal.        Assessment & Plan:   Problem List Items Addressed This Visit       Other   Left shoulder pain - Primary    Pain in ant shoulder and deltoid area after working with ourstretched arms Suspect shoulder tendonitis  Fairly good rom on exam Discussed use of ice prn 10 min  Go back to meloxicam 15 mg daily with food for 1-2 weeks Rom exercise handout given  Avoid heavy lifting/overhead work  Update if not starting to improve in a week or if worsening   Consider sport med ref / possible films if not improving

## 2022-08-31 NOTE — Assessment & Plan Note (Signed)
Pain in ant shoulder and deltoid area after working with ourstretched arms Suspect shoulder tendonitis  Fairly good rom on exam Discussed use of ice prn 10 min  Go back to meloxicam 15 mg daily with food for 1-2 weeks Rom exercise handout given  Avoid heavy lifting/overhead work  Update if not starting to improve in a week or if worsening   Consider sport med ref / possible films if not improving

## 2022-08-31 NOTE — Patient Instructions (Signed)
You may have some shoulder tendonitis   Try passive range of motion to help prevent frozen shoulder   Try meloxicam daily for 1-2 weeks with food Ice on the area for 10 minutes when you can   Update if not starting to improve in a week or if worsening   We would consider sport med consult if not improving in 1-2 weeks

## 2022-09-18 ENCOUNTER — Ambulatory Visit
Admission: RE | Admit: 2022-09-18 | Discharge: 2022-09-18 | Disposition: A | Discharge status: Home / Self Care | Payer: Managed Care, Other (non HMO) | Source: Ambulatory Visit | Attending: Emergency Medicine | Admitting: Emergency Medicine

## 2022-09-18 VITALS — BP 121/82 | HR 67 | Temp 98.2°F | Resp 18

## 2022-09-18 DIAGNOSIS — R002 Palpitations: Secondary | ICD-10-CM | POA: Diagnosis not present

## 2022-09-18 DIAGNOSIS — J069 Acute upper respiratory infection, unspecified: Secondary | ICD-10-CM

## 2022-09-18 MED ORDER — AZITHROMYCIN 250 MG PO TABS: 250.0000 mg | ORAL_TABLET | Freq: Every day | ORAL | 0 refills | Status: DC

## 2022-09-18 MED ORDER — BENZONATATE 100 MG PO CAPS: 100.0000 mg | ORAL_CAPSULE | Freq: Three times a day (TID) | ORAL | 0 refills | Status: DC

## 2022-09-18 MED ORDER — PREDNISONE 20 MG PO TABS: 40.0000 mg | ORAL_TABLET | Freq: Every day | ORAL | 0 refills | Status: DC

## 2022-09-18 NOTE — Discharge Instructions (Signed)
Low suspicion for any true involvement of your heart, vital signs are stable, normal heart sounds are heard when you are listening to you and your EKG shows that your heart is beating in a normal pace and rhythm  Begin use of azithromycin to provide coverage for bacteria which may be aiding to your symptoms  Begin use of prednisone every morning with food for 5 days to help to relax the airway, this medicine also helps with muscular pain which I believe your chest pain to be  You may use Tessalon pill every 8 hours as needed to help calm coughing    You can take Tylenol and/or Ibuprofen as needed for fever reduction and pain relief.   For cough: honey 1/2 to 1 teaspoon (you can dilute the honey in water or another fluid).  You can also use guaifenesin and dextromethorphan for cough. You can use a humidifier for chest congestion and cough.  If you don't have a humidifier, you can sit in the bathroom with the hot shower running.      For sore throat: try warm salt water gargles, cepacol lozenges, throat spray, warm tea or water with lemon/honey, popsicles or ice, or OTC cold relief medicine for throat discomfort.   For congestion: take a daily anti-histamine like Zyrtec, Claritin, and a oral decongestant, such as pseudoephedrine.  You can also use Flonase 1-2 sprays in each nostril daily.   It is important to stay hydrated: drink plenty of fluids (water, gatorade/powerade/pedialyte, juices, or teas) to keep your throat moisturized and help further relieve irritation/discomfort.

## 2022-09-18 NOTE — ED Provider Notes (Signed)
[facility]    CSN: [id number] Arrival date & time: 09/18/22  1051      History   Chief Complaint Chief Complaint  Patient presents with   Nasal Congestion    Light chest tightness, intermittent heart pounding that's not severeTaking benzonatate and promethazine DM at night - Entered by patient   Chest Pain    HPI Kari Rice is a 47 y.o. female.   Patient presents for evaluation of nasal congestion and a nonproductive cough present for 7 days.  Accompanying sore throat but able to tolerate food and liquids.  No known sick contacts prior.  Began to experience centralized chest pain and an episode of palpitations this morning, believes this is related to persistent coughing.  Has attempted use of old prescription of Tessalon and Promethazine DM which has been somewhat helpful.  Denies personal cardiac history, familial history of MI.  History of sleep apnea, has upcoming appointment June 17 with pulmonology for management.  Denies presence of fever.  Past Medical History:  Diagnosis Date   Arthritis    right hand   DVT (deep venous thrombosis) (HCC) 2013   GERD (gastroesophageal reflux disease)    PONV (postoperative nausea and vomiting)    after cholecystectomy   Sleep apnea    CPAP   Varicose veins     Patient Active Problem List   Diagnosis Date Noted   Left shoulder pain 08/31/2022   Lower abdominal pain 06/18/2022   Abnormal urinalysis 06/18/2022   Acquired trigger finger 02/26/2022   Obesity (BMI 30-39.9) 12/16/2021   OSA (obstructive sleep apnea) 12/03/2021   Sigmoid diverticulosis    Colon cancer screening 04/18/2021   Dysthymia 12/26/2019   Right leg swelling 12/11/2019   Fungal nail infection 10/11/2019   Patellofemoral stress syndrome 07/20/2018   Left anterior knee pain 04/26/2018   Injury of left toe 01/25/2018   Left hand paresthesia 11/22/2017   GERD (gastroesophageal reflux disease) 11/22/2017   H/O vaginitis 08/10/2017   Headache  07/07/2017   Hair loss 03/25/2016   Vitamin D deficiency 01/15/2016   Routine general medical examination at a health care facility 05/29/2014   Encounter for routine gynecological examination 05/29/2014   Screening for HIV (human immunodeficiency virus) 05/29/2014   Encounter for screening mammogram for breast cancer 05/29/2014   Joint pain 04/10/2014   History of DVT (deep vein thrombosis) 07/06/2012   Varicose veins of lower extremities with other complications 07/06/2012   Venous (peripheral) insufficiency 12/23/2011   Phlebitis and thrombophlebitis of the leg 11/06/2011   Venous insufficiency, peripheral 10/26/2011   Pedal edema 10/26/2011   IRREGULAR MENSES 02/29/2008   TRICHOMONAL VAGINITIS 03/18/2007    Past Surgical History:  Procedure Laterality Date   CHOLECYSTECTOMY  Feb. 2006   Gall Bladder   COLONOSCOPY WITH PROPOFOL N/A 05/23/2021   Procedure: COLONOSCOPY WITH PROPOFOL;  Surgeon: Toney Reil, MD;  Location: [facility];  Service: Gastroenterology;  Laterality: N/A;   HAND SURGERY  2016   NASAL SEPTOPLASTY W/ TURBINOPLASTY Bilateral 10/01/2017   Procedure: NASAL SEPTOPLASTY WITH SUBMUCOCELE RESECTION OF TURBINATE;  Surgeon: Linus Salmons, MD;  Location: [facility];  Service: ENT;  Laterality: Bilateral;  Sleep apnea   ROBOTIC ASSISTED LAPAROSCOPIC HYSTERECTOMY AND SALPINGECTOMY Bilateral 03/13/2020   Procedure: XI ROBOTIC ASSISTED LAPAROSCOPIC HYSTERECTOMY AND SALPINGECTOMY;  Surgeon: Natale Milch, MD;  Location: [facility];  Service: Gynecology;  Laterality: Bilateral;   THROMBECTOMY Right 01/13/12 and  01/21/12   Right iliac vein stent and  Vein thrombosis    OB History     Gravida  0   Para  0   Term  0   Preterm  0   AB  0   Living  0      SAB  0   IAB  0   Ectopic  0   Multiple  0   Live Births  0            Home Medications    Prior to Admission medications   Medication Sig Start Date End Date Taking?  Authorizing Provider  acetaminophen (TYLENOL) 650 MG CR tablet Take 1,300 mg by mouth every 8 (eight) hours as needed for pain.   Yes [provider]  azithromycin (ZITHROMAX) 250 MG tablet Take 1 tablet (250 mg total) by mouth daily. Take first 2 tablets together, then 1 every day until finished. 09/18/22  Yes Gurney Balthazor R, NP  benzonatate (TESSALON) 100 MG capsule Take 1 capsule (100 mg total) by mouth every 8 (eight) hours. 09/18/22  Yes Delayni Streed R, NP  FLUoxetine (PROZAC) 20 MG tablet Take 1 tablet (20 mg total) by mouth daily. 05/12/22  Yes Tower, Audrie Gallus, MD  NON FORMULARY Support hose to the waist for dx of venous insufficiency and edema 15-20 mm Hg   Yes [provider]  omeprazole (PRILOSEC) 20 MG capsule Take 20 mg by mouth as needed.   Yes [provider]  predniSONE (DELTASONE) 20 MG tablet Take 2 tablets (40 mg total) by mouth daily. 09/18/22  Yes Garlon Tuggle R, NP  promethazine-dextromethorphan (PROMETHAZINE-DM) 6.25-15 MG/5ML syrup Take 5 mLs by mouth at bedtime as needed for cough. 08/13/22  Yes Audryanna Zurita R, NP  b complex vitamins capsule Take 1 capsule by mouth daily.    [provider]  Cholecalciferol (VITAMIN D3) 2000 units TABS Take 1 tablet by mouth daily.    [provider]  meloxicam (MOBIC) 15 MG tablet Take 1 tablet (15 mg total) by mouth daily as needed for pain. With food 08/31/22   Tower, Audrie Gallus, MD    Family History Family History  Problem Relation Age of Onset   Arthritis Mother    Cancer Mother        uterine    Heart disease Father        Heart Disease before age 28   Cancer Father        skin    Hypertension Father    Heart attack Father     Social History Social History   Tobacco Use   Smoking status: Never    Passive exposure: Past   Smokeless tobacco: Never  Vaping Use   Vaping Use: Never used  Substance Use Topics   Alcohol use: No    Alcohol/week: 0.0 standard drinks of alcohol    Drug use: No     Allergies   Penicillins and Sulfa antibiotics   Review of Systems Review of Systems  Cardiovascular:  Positive for chest pain.     Physical Exam Triage Vital Signs ED Triage Vitals  Enc Vitals Group     BP 09/18/22 1129 121/82     Pulse Rate 09/18/22 1129 67     Resp 09/18/22 1129 18     Temp 09/18/22 1129 98.2 F (36.8 C)     Temp Source 09/18/22 1129 Oral     SpO2 09/18/22 1129 97 %     Weight --      Height --  Head Circumference --      Peak Flow --      Pain Score 09/18/22 1136 3     Pain Loc --      Pain Edu? --      Excl. in GC? --    No data found.  Updated Vital Signs BP 121/82 (BP Location: Left Arm)   Pulse 67   Temp 98.2 F (36.8 C) (Oral)   Resp 18   LMP  (LMP Unknown)   SpO2 97%   Visual Acuity Right Eye Distance:   Left Eye Distance:   Bilateral Distance:    Right Eye Near:   Left Eye Near:    Bilateral Near:     Physical Exam Constitutional:      Appearance: Normal appearance.  HENT:     Right Ear: Tympanic membrane, ear canal and external ear normal.     Left Ear: Tympanic membrane, ear canal and external ear normal.     Nose: Congestion present. No rhinorrhea.     Mouth/Throat:     Mouth: Mucous membranes are moist.     Pharynx: Oropharynx is clear.  Eyes:     Extraocular Movements: Extraocular movements intact.  Cardiovascular:     Rate and Rhythm: Normal rate and regular rhythm.     Pulses: Normal pulses.     Heart sounds: Normal heart sounds.  Pulmonary:     Effort: Pulmonary effort is normal.     Breath sounds: Normal breath sounds.  Skin:    General: Skin is warm and dry.  Neurological:     Mental Status: She is alert and oriented to person, place, and time. Mental status is at baseline.      UC Treatments / Results  Labs (all labs ordered are listed, but only abnormal results are displayed) Labs Reviewed - No data to display  EKG   Radiology No results  found.  Procedures Procedures (including critical care time)  Medications Ordered in UC Medications - No data to display  Initial Impression / Assessment and Plan / UC Course  I have reviewed the triage vital signs and the nursing notes.  Pertinent labs & imaging results that were available during my care of the patient were reviewed by me and considered in my medical decision making (see chart for details).  ACute URI, palpitations  Vital signs are stable patient is in no signs of distress nontoxic-appearing, S1 and S2 heard to auscultation, EKG showing normal sinus rhythm, chest pain is most likely muscular low suspicion for true cardiac involvement, discussed with patient, lungs are clear to auscultation therefore will defer x-ray imaging, prescribed azithromycin, prednisone and refilled Tessalon, make attempt use of any additional over-the-counter medications as needed, advised follow-up with urgent care or PCP as needed Final Clinical Impressions(s) / UC Diagnoses   Final diagnoses:  Palpitations  Acute URI     Discharge Instructions      Low suspicion for any true involvement of your heart, vital signs are stable, normal heart sounds are heard when you are listening to you and your EKG shows that your heart is beating in a normal pace and rhythm  Begin use of azithromycin to provide coverage for bacteria which may be aiding to your symptoms  Begin use of prednisone every morning with food for 5 days to help to relax the airway, this medicine also helps with muscular pain which I believe your chest pain to be  You may use Tessalon pill every 8 hours  as needed to help calm coughing    You can take Tylenol and/or Ibuprofen as needed for fever reduction and pain relief.   For cough: honey 1/2 to 1 teaspoon (you can dilute the honey in water or another fluid).  You can also use guaifenesin and dextromethorphan for cough. You can use a humidifier for chest congestion and cough.   If you don't have a humidifier, you can sit in the bathroom with the hot shower running.      For sore throat: try warm salt water gargles, cepacol lozenges, throat spray, warm tea or water with lemon/honey, popsicles or ice, or OTC cold relief medicine for throat discomfort.   For congestion: take a daily anti-histamine like Zyrtec, Claritin, and a oral decongestant, such as pseudoephedrine.  You can also use Flonase 1-2 sprays in each nostril daily.   It is important to stay hydrated: drink plenty of fluids (water, gatorade/powerade/pedialyte, juices, or teas) to keep your throat moisturized and help further relieve irritation/discomfort.    ED Prescriptions     Medication Sig Dispense Auth. Provider   azithromycin (ZITHROMAX) 250 MG tablet Take 1 tablet (250 mg total) by mouth daily. Take first 2 tablets together, then 1 every day until finished. 6 tablet Jocelin Schuelke R, NP   predniSONE (DELTASONE) 20 MG tablet Take 2 tablets (40 mg total) by mouth daily. 10 tablet Pieper Kasik R, NP   benzonatate (TESSALON) 100 MG capsule Take 1 capsule (100 mg total) by mouth every 8 (eight) hours. 21 capsule Domenick Quebedeaux, Elita Boone, NP      PDMP not reviewed this encounter.   Valinda Hoar, NP 09/18/22 1243

## 2022-09-18 NOTE — ED Triage Notes (Signed)
Pt presents with chest pain that started today in center chest more like soreness from coughing that comes and goes, did states she had slight heart palpitations this morning, chest tightness, throat irritation that started a week ago. Pt does have apt. With pulmonologist 09/28/22.

## 2022-09-28 ENCOUNTER — Ambulatory Visit
Admission: RE | Admit: 2022-09-28 | Discharge: 2022-09-28 | Disposition: A | Discharge status: Home / Self Care | Payer: Managed Care, Other (non HMO) | Source: Ambulatory Visit | Attending: Internal Medicine | Admitting: Internal Medicine

## 2022-09-28 ENCOUNTER — Encounter: Payer: Self-pay | Admitting: Internal Medicine

## 2022-09-28 ENCOUNTER — Ambulatory Visit (INDEPENDENT_AMBULATORY_CARE_PROVIDER_SITE_OTHER): Payer: Managed Care, Other (non HMO) | Admitting: Internal Medicine

## 2022-09-28 VITALS — BP 130/72 | HR 72 | Temp 97.8°F | Ht 64.0 in | Wt 243.4 lb

## 2022-09-28 DIAGNOSIS — Z7189 Other specified counseling: Secondary | ICD-10-CM

## 2022-09-28 DIAGNOSIS — J9801 Acute bronchospasm: Secondary | ICD-10-CM

## 2022-09-28 MED ORDER — ALBUTEROL SULFATE HFA 108 (90 BASE) MCG/ACT IN AERS
2.0000 | INHALATION_SPRAY | RESPIRATORY_TRACT | 2 refills | Status: DC | PRN
Start: 2022-09-28 — End: 2022-11-16

## 2022-09-28 NOTE — Progress Notes (Signed)
@Patient  ID: Kari Rice, female    DOB: 1975-09-11, 47 y.o.   MRN: [id number]   CC Follow up OSA   HPI: 47 year old female followed for obstructive sleep apnea .  reestablish for sleep apnea.   TEST/EVENTS :  Home sleep study December 30, 2015 AHI 12.5  09/28/2022 Patient presents for a sleep consult today to reestablish for sleep apnea.   Patient was previously diagnosed with sleep apnea in 2017 with a home sleep study that showed mild sleep apnea with AHI of 12.5's/hour.    Patient was started on CPAP.  She was last seen in the office in May 2019.  Caffeine intake is usually 2 cups of coffee daily.  No symptoms suspicious for cataplexy or sleep paralysis.   She uses a nasal mask.  Gets her supplies and CPAP from [place].  She does not use any sleep aids.   CPAP download shows excellent compliance with 100% usage.   AHI reduced to 1.3 She uses and benefits from therapy Compliance report reviewed in detail with patient  Patient currently has nonproductive cough for the last several months Diagnosed with COVID in November 2023 Diagnosed with COVID February 2024 Symptoms of bronchospasm Patient is a non-smoker Has not used any inhalers in the past   Medical history significant for Bell's palsy, DVT, GERD, Depression   Surgical history significant for cholecystectomy, nasal septoplasty, hysterectomy  Social history patient lives with her mother.  She works in Clinical biochemist at [employer].  She has no children.  She is divorced.  She is a never smoker.  No alcohol or drug use.  Family history positive for heart disease, rheumatoid arthritis and cancer.   Allergies  Allergen Reactions   Penicillins Rash   Sulfa Antibiotics Rash    Immunization History  Administered Date(s) Administered   Influenza,inj,Quad PF,6+ Mos 04/30/2014, 01/14/2016, 01/11/2018, 12/05/2018, 12/26/2019, 04/18/2021   PFIZER(Purple Top)SARS-COV-2 Vaccination 06/12/2019, 07/03/2019, 04/01/2020    Td 04/02/2008   Tdap 08/10/2017    Past Medical History:  Diagnosis Date   Arthritis    right hand   DVT (deep venous thrombosis) (HCC) 2013   GERD (gastroesophageal reflux disease)    PONV (postoperative nausea and vomiting)    after cholecystectomy   Sleep apnea    CPAP   Varicose veins     Tobacco History: Social History   Tobacco Use  Smoking Status Never   Passive exposure: Past  Smokeless Tobacco Never   Counseling given: Not Answered   Outpatient Medications Prior to Visit  Medication Sig Dispense Refill   acetaminophen (TYLENOL) 650 MG CR tablet Take 1,300 mg by mouth every 8 (eight) hours as needed for pain.     b complex vitamins capsule Take 1 capsule by mouth daily.     benzonatate (TESSALON) 100 MG capsule Take 1 capsule (100 mg total) by mouth every 8 (eight) hours. 21 capsule 0   Cholecalciferol (VITAMIN D3) 2000 units TABS Take 1 tablet by mouth daily.     FLUoxetine (PROZAC) 20 MG tablet Take 1 tablet (20 mg total) by mouth daily. 90 tablet 3   meloxicam (MOBIC) 15 MG tablet Take 1 tablet (15 mg total) by mouth daily as needed for pain. With food 30 tablet 1   NON FORMULARY Support hose to the waist for dx of venous insufficiency and edema 15-20 mm Hg     omeprazole (PRILOSEC) 20 MG capsule Take 20 mg by mouth as needed.     promethazine-dextromethorphan (PROMETHAZINE-DM) 6.25-15  MG/5ML syrup Take 5 mLs by mouth at bedtime as needed for cough. 118 mL 0   azithromycin (ZITHROMAX) 250 MG tablet Take 1 tablet (250 mg total) by mouth daily. Take first 2 tablets together, then 1 every day until finished. 6 tablet 0   predniSONE (DELTASONE) 20 MG tablet Take 2 tablets (40 mg total) by mouth daily. 10 tablet 0   No facility-administered medications prior to visit.          Assessment & Plan:  47 year old pleasant white female seen today for follow-up assessment for sleep apnea with a previous AHI of 12.5 also with a previous diagnosis of COVID-19  infection in November 2023 and also a COVID-19 faction in 02 June 2022 with ongoing cough and bronchospasms likely related to reactive airways disease in the setting of uncontrolled reflux    Regarding OSA  Excellent compliance report reviewed with patient in detail  Continue auto CPAP 5-15 as prescribed  AHI reduced to 1.3  100% compliance for days and greater than 4 hours  Patient use and benefits from therapy   Regarding cough and bronchospasms  Likely related to reactive airways disease due to multifactorial process with obesity uncontrolled reflux and previous history of COVID-19 infection  Recommend using daily medication PPI for GERD to control reflux  Will start albuterol 2 puffs every 4 hours as needed  Will not start inhaled steroids or long-acting beta agonist at this time   Obesity -recommend significant weight loss -recommend changing diet  Deconditioned state -Recommend increased daily activity and exercise  Previous COVID-19 infection and cough Recommend obtaining pulmonary function test Obtain chest x-ray     MEDICATION ADJUSTMENTS/LABS AND TESTS ORDERED: Continue CPAP as prescribed Obtain chest x-ray to assess for lung damage from COVID Obtain pulmonary function test to assess lung function Recommend using albuterol 2 puffs every 4 hours as needed Avoid secondhand smoke Avoid SICK contacts Recommend  Masking  when appropriate Recommend Keep up-to-date with vaccinations Recommend weigt loss Recommend Daily medication use to control Reflux     CURRENT MEDICATIONS REVIEWED AT LENGTH WITH PATIENT TODAY   Patient  satisfied with Plan of action and management. All questions answered  Follow up 6 months  Total Time Spent  35 mins   Wallis Bamberg Santiago Glad, M.D.  [facility]  Medical Director [facility] Medical Director [facility] Cardio-Pulmonary Department

## 2022-09-28 NOTE — Patient Instructions (Addendum)
Excellent Job A+ Continue CPAP as prescribed  Obtain chest x-ray to assess for lung damage from COVID Obtain pulmonary function test to assess lung function  Recommend using albuterol 2 puffs every 4 hours as needed  Avoid secondhand smoke Avoid SICK contacts Recommend  Masking  when appropriate Recommend Keep up-to-date with vaccinations   Recommend weigt loss   Recommend Daily medication use to control Reflux

## 2022-10-07 ENCOUNTER — Ambulatory Visit (INDEPENDENT_AMBULATORY_CARE_PROVIDER_SITE_OTHER): Payer: Managed Care, Other (non HMO) | Admitting: Family Medicine

## 2022-10-07 ENCOUNTER — Encounter: Payer: Self-pay | Admitting: Family Medicine

## 2022-10-07 VITALS — BP 108/66 | HR 65 | Temp 98.0°F | Ht 64.0 in | Wt 243.0 lb

## 2022-10-07 DIAGNOSIS — S96912A Strain of unspecified muscle and tendon at ankle and foot level, left foot, initial encounter: Secondary | ICD-10-CM

## 2022-10-07 DIAGNOSIS — S060X0A Concussion without loss of consciousness, initial encounter: Secondary | ICD-10-CM | POA: Diagnosis not present

## 2022-10-07 DIAGNOSIS — T148XXA Other injury of unspecified body region, initial encounter: Secondary | ICD-10-CM | POA: Insufficient documentation

## 2022-10-07 DIAGNOSIS — S60222A Contusion of left hand, initial encounter: Secondary | ICD-10-CM | POA: Diagnosis not present

## 2022-10-07 DIAGNOSIS — W19XXXA Unspecified fall, initial encounter: Secondary | ICD-10-CM | POA: Insufficient documentation

## 2022-10-07 DIAGNOSIS — S96919A Strain of unspecified muscle and tendon at ankle and foot level, unspecified foot, initial encounter: Secondary | ICD-10-CM | POA: Insufficient documentation

## 2022-10-07 DIAGNOSIS — Z23 Encounter for immunization: Secondary | ICD-10-CM | POA: Diagnosis not present

## 2022-10-07 DIAGNOSIS — S060XAA Concussion with loss of consciousness status unknown, initial encounter: Secondary | ICD-10-CM | POA: Insufficient documentation

## 2022-10-07 DIAGNOSIS — S0990XA Unspecified injury of head, initial encounter: Secondary | ICD-10-CM | POA: Insufficient documentation

## 2022-10-07 NOTE — Assessment & Plan Note (Signed)
At work - injuring head and left ankle and hand   See separate evaluations  Discussed fall prevention n the future

## 2022-10-07 NOTE — Patient Instructions (Addendum)
Clean abrasions with soap and water  Antibiotic ointment over the counter as needed  Ice to the sore areas for 10 minutes at a time  Brain rest for 2 days (no screens, work, reading, loud music, bright light)  I think you have mild concussion  Watch for worse headache or any dizziness, nausea, confusion  Let us know   If any severe symptoms - go to the ER Tetanus shot today

## 2022-10-07 NOTE — Assessment & Plan Note (Signed)
Mild from fall yesterday Entirely normal exam except for abrasion  Recommend ice prn  Update if not starting to improve in a week or if worsening

## 2022-10-07 NOTE — Assessment & Plan Note (Signed)
Left forehead and hand after fall on concrete  Discussed cleaning with soap and water  Td updated  Watch for signs and symptoms of infection

## 2022-10-07 NOTE — Assessment & Plan Note (Signed)
On concrete/fall  Reassuring exam  Mild concussion  Tetanus shot updated

## 2022-10-07 NOTE — Assessment & Plan Note (Addendum)
Very mild after fall and head injury on concrete yesterday  Today headache is mild/ improved and no addn neuro symptoms  Discussed call back and Er precautions (see AVS) in detail Handout given  Very reassuring exam Recommend brain rest from now until early next week  Update if not starting to improve in a week or if worsening

## 2022-10-07 NOTE — Progress Notes (Signed)
Subjective:    Patient ID: Kari Rice, female    DOB: [birth year], 47 y.o.   MRN: [id number]  HPI  Wt Readings from Last 3 Encounters:  10/07/22 243 lb (110.2 kg)  09/28/22 243 lb 6.4 oz (110.4 kg)  08/31/22 243 lb 6 oz (110.4 kg)   41.71 kg/m  Vitals:   10/07/22 0803  BP: 108/66  Pulse: 65  Temp: 98 F (36.7 C)  SpO2: 100%    Pt presents for follow up of a fall injuring head, hand and left ankle  Leaving work 5 pm yesterday Tripped over a fold in a mat  Landed on left side / did not catch herself (too quick)  Had a headache -sharp yesterday/ light sensitive Dull today , improved  No nausea or dizziness  A little trouble focusing  No confusion   Head hit the concrete above left eye - started bleeding  Cleaned with soap/water and washcloth   Right hand has an abrasion   Ankle - ? Twisted   Started bothering her a little this am  No swelling or bruised  Pain is lateral and radiates to the toe     Patient Active Problem List   Diagnosis Date Noted   Fall 10/07/2022   Concussion 10/07/2022   Head injury 10/07/2022   Abrasion 10/07/2022   Ankle strain 10/07/2022   Contusion of left hand 10/07/2022   Left shoulder pain 08/31/2022   Lower abdominal pain 06/18/2022   Abnormal urinalysis 06/18/2022   Acquired trigger finger 02/26/2022   Obesity (BMI 30-39.9) 12/16/2021   OSA (obstructive sleep apnea) 12/03/2021   Sigmoid diverticulosis    Colon cancer screening 04/18/2021   Dysthymia 12/26/2019   Right leg swelling 12/11/2019   Fungal nail infection 10/11/2019   Patellofemoral stress syndrome 07/20/2018   Left anterior knee pain 04/26/2018   Injury of left toe 01/25/2018   Left hand paresthesia 11/22/2017   GERD (gastroesophageal reflux disease) 11/22/2017   H/O vaginitis 08/10/2017   Headache 07/07/2017   Hair loss 03/25/2016   Vitamin D deficiency 01/15/2016   Routine general medical examination at a health care facility 05/29/2014   Encounter  for routine gynecological examination 05/29/2014   Screening for HIV (human immunodeficiency virus) 05/29/2014   Encounter for screening mammogram for breast cancer 05/29/2014   Joint pain 04/10/2014   History of DVT (deep vein thrombosis) 07/06/2012   Varicose veins of lower extremities with other complications 07/06/2012   Venous (peripheral) insufficiency 12/23/2011   Phlebitis and thrombophlebitis of the leg 11/06/2011   Venous insufficiency, peripheral 10/26/2011   Pedal edema 10/26/2011   IRREGULAR MENSES 02/29/2008   TRICHOMONAL VAGINITIS 03/18/2007   Past Medical History:  Diagnosis Date   Arthritis    right hand   DVT (deep venous thrombosis) (HCC) 2013   GERD (gastroesophageal reflux disease)    PONV (postoperative nausea and vomiting)    after cholecystectomy   Sleep apnea    CPAP   Varicose veins    Past Surgical History:  Procedure Laterality Date   CHOLECYSTECTOMY  Feb. 2006   Gall Bladder   COLONOSCOPY WITH PROPOFOL N/A 05/23/2021   Procedure: COLONOSCOPY WITH PROPOFOL;  Surgeon: Toney Reil, MD;  Location: [facility] ENDOSCOPY;  Service: Gastroenterology;  Laterality: N/A;   HAND SURGERY  2016   NASAL SEPTOPLASTY W/ TURBINOPLASTY Bilateral 10/01/2017   Procedure: NASAL SEPTOPLASTY WITH SUBMUCOCELE RESECTION OF TURBINATE;  Surgeon: Linus Salmons, MD;  Location: [facility];  Service: ENT;  Laterality: Bilateral;  Sleep apnea   ROBOTIC ASSISTED LAPAROSCOPIC HYSTERECTOMY AND SALPINGECTOMY Bilateral 03/13/2020   Procedure: XI ROBOTIC ASSISTED LAPAROSCOPIC HYSTERECTOMY AND SALPINGECTOMY;  Surgeon: Natale Milch, MD;  Location: [facility] ORS;  Service: Gynecology;  Laterality: Bilateral;   THROMBECTOMY Right 01/13/12 and  01/21/12   Right iliac vein stent and Vein thrombosis   Social History   Tobacco Use   Smoking status: Never    Passive exposure: Past   Smokeless tobacco: Never  Vaping Use   Vaping Use: Never used  Substance Use Topics    Alcohol use: No    Alcohol/week: 0.0 standard drinks of alcohol   Drug use: No   Family History  Problem Relation Age of Onset   Arthritis Mother    Cancer Mother        uterine    Heart disease Father        Heart Disease before age 70   Cancer Father        skin    Hypertension Father    Heart attack Father    Allergies  Allergen Reactions   Penicillins Rash   Sulfa Antibiotics Rash   Current Outpatient Medications on File Prior to Visit  Medication Sig Dispense Refill   acetaminophen (TYLENOL) 650 MG CR tablet Take 1,300 mg by mouth every 8 (eight) hours as needed for pain.     albuterol (VENTOLIN HFA) 108 (90 Base) MCG/ACT inhaler Inhale 2 puffs into the lungs every 4 (four) hours as needed for wheezing or shortness of breath. 8 g 2   b complex vitamins capsule Take 1 capsule by mouth daily.     Cholecalciferol (VITAMIN D3) 2000 units TABS Take 1 tablet by mouth daily.     FLUoxetine (PROZAC) 20 MG tablet Take 1 tablet (20 mg total) by mouth daily. 90 tablet 3   NON FORMULARY Support hose to the waist for dx of venous insufficiency and edema 15-20 mm Hg     omeprazole (PRILOSEC) 20 MG capsule Take 20 mg by mouth as needed.     No current facility-administered medications on file prior to visit.    Review of Systems  Constitutional:  Negative for activity change, appetite change, fatigue, fever and unexpected weight change.  HENT:  Negative for congestion, ear pain, rhinorrhea, sinus pressure and sore throat.   Eyes:  Negative for pain, redness and visual disturbance.  Respiratory:  Negative for cough, shortness of breath and wheezing.   Cardiovascular:  Negative for chest pain and palpitations.  Gastrointestinal:  Negative for abdominal pain, blood in stool, constipation and diarrhea.  Endocrine: Negative for polydipsia and polyuria.  Genitourinary:  Negative for dysuria, frequency and urgency.  Musculoskeletal:  Positive for arthralgias. Negative for back pain and  myalgias.  Skin:  Positive for wound. Negative for pallor and rash.  Allergic/Immunologic: Negative for environmental allergies.  Neurological:  Positive for headaches. Negative for dizziness, tremors, seizures, syncope, facial asymmetry, speech difficulty, weakness, light-headedness and numbness.  Hematological:  Negative for adenopathy. Does not bruise/bleed easily.  Psychiatric/Behavioral:  Negative for decreased concentration and dysphoric mood. The patient is not nervous/anxious.        Objective:   Physical Exam Constitutional:      General: She is not in acute distress.    Appearance: Normal appearance. She is well-developed. She is obese. She is not ill-appearing or diaphoretic.  HENT:     Head: Normocephalic.     Right Ear: Tympanic membrane, ear canal and external ear  normal.     Left Ear: Tympanic membrane, ear canal and external ear normal.     Nose: Nose normal.     Mouth/Throat:     Pharynx: No oropharyngeal exudate.  Eyes:     General: No scleral icterus.       Right eye: No discharge.        Left eye: No discharge.     Conjunctiva/sclera: Conjunctivae normal.     Pupils: Pupils are equal, round, and reactive to light.     Comments: No nystagmus  Neck:     Thyroid: No thyromegaly.     Vascular: No carotid bruit or JVD.     Trachea: No tracheal deviation.  Cardiovascular:     Rate and Rhythm: Normal rate and regular rhythm.     Heart sounds: Normal heart sounds. No murmur heard. Pulmonary:     Effort: Pulmonary effort is normal. No respiratory distress.     Breath sounds: Normal breath sounds. No wheezing or rales.  Abdominal:     General: Bowel sounds are normal. There is no distension.     Palpations: Abdomen is soft. There is no mass.     Tenderness: There is no abdominal tenderness.  Musculoskeletal:        General: No tenderness.     Cervical back: Full passive range of motion without pain, normal range of motion and neck supple.     Comments: No  tenderness or swelling or left hand or left foot/ankle Normal rom  No gait   Lymphadenopathy:     Cervical: No cervical adenopathy.  Skin:    General: Skin is warm and dry.     Coloration: Skin is not pale.     Findings: No rash.     Comments: Small clean appearing abrations left face/forehead and dorsum of left hand  Neurological:     Mental Status: She is alert and oriented to person, place, and time.     Cranial Nerves: No cranial nerve deficit or facial asymmetry.     Sensory: Sensation is intact. No sensory deficit.     Motor: No weakness, tremor, atrophy, abnormal muscle tone, seizure activity or pronator drift.     Coordination: Romberg sign negative. Coordination normal. Finger-Nose-Finger Test normal.     Gait: Gait is intact. Gait normal.     Deep Tendon Reflexes: Reflexes are normal and symmetric. Reflexes normal.     Comments: No focal cerebellar signs   Psychiatric:        Behavior: Behavior normal.        Thought Content: Thought content normal.           Assessment & Plan:   Problem List Items Addressed This Visit       Musculoskeletal and Integument   Ankle strain    Mild / from fall yesterday Normal exam  Suggest compression/elevation and ice as needed  Update if not starting to improve in a week or if worsening          Other   Head injury    On concrete/fall  Reassuring exam  Mild concussion  Tetanus shot updated       Fall    At work - injuring head and left ankle and hand   See separate evaluations  Discussed fall prevention n the future       Contusion of left hand    Mild from fall yesterday Entirely normal exam except for abrasion  Recommend ice prn  Update  if not starting to improve in a week or if worsening        Relevant Orders   Td : Tetanus/diphtheria >7yo Preservative  free (Completed)   Concussion - Primary    Very mild after fall and head injury on concrete yesterday  Today headache is mild/ improved and no addn  neuro symptoms  Discussed call back and Er precautions (see AVS) in detail Handout given  Very reassuring exam Recommend brain rest from now until early next week  Update if not starting to improve in a week or if worsening         Abrasion    Left forehead and hand after fall on concrete  Discussed cleaning with soap and water  Td updated  Watch for signs and symptoms of infection       Relevant Orders   Td : Tetanus/diphtheria >7yo Preservative  free (Completed)

## 2022-10-07 NOTE — Assessment & Plan Note (Signed)
Mild / from fall yesterday Normal exam  Suggest compression/elevation and ice as needed  Update if not starting to improve in a week or if worsening

## 2022-10-12 ENCOUNTER — Encounter: Payer: Self-pay | Admitting: Family Medicine

## 2022-11-16 ENCOUNTER — Encounter: Payer: Self-pay | Admitting: Family Medicine

## 2022-11-16 ENCOUNTER — Ambulatory Visit: Payer: Managed Care, Other (non HMO) | Admitting: Family Medicine

## 2022-11-16 VITALS — BP 128/78 | HR 64 | Temp 97.6°F | Ht 64.0 in | Wt 235.4 lb

## 2022-11-16 DIAGNOSIS — K219 Gastro-esophageal reflux disease without esophagitis: Secondary | ICD-10-CM

## 2022-11-16 MED ORDER — FAMOTIDINE 20 MG PO TABS
20.0000 mg | ORAL_TABLET | Freq: Two times a day (BID) | ORAL | 1 refills | Status: DC
Start: 2022-11-16 — End: 2022-12-08

## 2022-11-16 NOTE — Patient Instructions (Signed)
Take omeprazole first thing in the am at least 30 minutes before food/meds/vitamins   Take the generic of pepcid mid morning and again in the evening before bed   Don't eat late  Keep losing weight   Avoid foods that flare you   Here is a hand out   A wedge for your bed is helpful for reflux also   Follow up in a month

## 2022-11-16 NOTE — Progress Notes (Signed)
Subjective:    Patient ID: Kari Rice, female    DOB: April 20, 1975, 47 y.o.   MRN: [id number]  HPI  Wt Readings from Last 3 Encounters:  11/16/22 235 lb 6 oz (106.8 kg)  10/07/22 243 lb (110.2 kg)  09/28/22 243 lb 6.4 oz (110.4 kg)   40.40 kg/m  Vitals:   11/16/22 1600  BP: 128/78  Pulse: 64  Temp: 97.6 F (36.4 C)  SpO2: 97%   Pt presents with worsening indigestion  Omeprazole is not helping  Coughs when eating   Was working fine for a while until a few weeks ago  More cough-night and morning  Tight chest  Clearing throat a lot more   Cut herself back to eating twice daily = avoids eating in pm  Helped some but not all the way   No nsaids  No excess citrus  One cup of coffee per day  No carbonation usually  Usually water  Occational tea   Flared by pancakes  Not a lot of spicy foods   No abd pain  No nausea or vomiting   Occational feels like food sticks half way down -not often Chicken is the worst    Not more stressed     Used to take protonix (from pulmonary) = that made her heart race and she switched   Has history of diverticulosis     Patient Active Problem List   Diagnosis Date Noted   Fall 10/07/2022   Concussion 10/07/2022   Head injury 10/07/2022   Abrasion 10/07/2022   Ankle strain 10/07/2022   Contusion of left hand 10/07/2022   Left shoulder pain 08/31/2022   Lower abdominal pain 06/18/2022   Abnormal urinalysis 06/18/2022   Acquired trigger finger 02/26/2022   Obesity (BMI 30-39.9) 12/16/2021   OSA (obstructive sleep apnea) 12/03/2021   Sigmoid diverticulosis    Colon cancer screening 04/18/2021   Dysthymia 12/26/2019   Right leg swelling 12/11/2019   Fungal nail infection 10/11/2019   Patellofemoral stress syndrome 07/20/2018   Left anterior knee pain 04/26/2018   Injury of left toe 01/25/2018   Left hand paresthesia 11/22/2017   GERD (gastroesophageal reflux disease) 11/22/2017   H/O vaginitis 08/10/2017    Headache 07/07/2017   Hair loss 03/25/2016   Vitamin D deficiency 01/15/2016   Routine general medical examination at a health care facility 05/29/2014   Encounter for routine gynecological examination 05/29/2014   Screening for HIV (human immunodeficiency virus) 05/29/2014   Encounter for screening mammogram for breast cancer 05/29/2014   Joint pain 04/10/2014   History of DVT (deep vein thrombosis) 07/06/2012   Varicose veins of lower extremities with other complications 07/06/2012   Venous (peripheral) insufficiency 12/23/2011   Phlebitis and thrombophlebitis of the leg 11/06/2011   Venous insufficiency, peripheral 10/26/2011   Pedal edema 10/26/2011   IRREGULAR MENSES 02/29/2008   TRICHOMONAL VAGINITIS 03/18/2007   Past Medical History:  Diagnosis Date   Arthritis    right hand   DVT (deep venous thrombosis) (HCC) 2013   GERD (gastroesophageal reflux disease)    PONV (postoperative nausea and vomiting)    after cholecystectomy   Sleep apnea    CPAP   Varicose veins    Past Surgical History:  Procedure Laterality Date   CHOLECYSTECTOMY  Feb. 2006   Gall Bladder   COLONOSCOPY WITH PROPOFOL N/A 05/23/2021   Procedure: COLONOSCOPY WITH PROPOFOL;  Surgeon: Toney Reil, MD;  Location: [facility];  Service: Gastroenterology;  Laterality: N/A;  HAND SURGERY  2016   NASAL SEPTOPLASTY W/ TURBINOPLASTY Bilateral 10/01/2017   Procedure: NASAL SEPTOPLASTY WITH SUBMUCOCELE RESECTION OF TURBINATE;  Surgeon: Linus Salmons, MD;  Location: [facility];  Service: ENT;  Laterality: Bilateral;  Sleep apnea   ROBOTIC ASSISTED LAPAROSCOPIC HYSTERECTOMY AND SALPINGECTOMY Bilateral 03/13/2020   Procedure: XI ROBOTIC ASSISTED LAPAROSCOPIC HYSTERECTOMY AND SALPINGECTOMY;  Surgeon: Natale Milch, MD;  Location: [facility] ORS;  Service: Gynecology;  Laterality: Bilateral;   THROMBECTOMY Right 01/13/12 and  01/21/12   Right iliac vein stent and Vein thrombosis   Social History    Tobacco Use   Smoking status: Never    Passive exposure: Past   Smokeless tobacco: Never  Vaping Use   Vaping status: Never Used  Substance Use Topics   Alcohol use: No    Alcohol/week: 0.0 standard drinks of alcohol   Drug use: No   Family History  Problem Relation Age of Onset   Arthritis Mother    Cancer Mother        uterine    Heart disease Father        Heart Disease before age 62   Cancer Father        skin    Hypertension Father    Heart attack Father    Allergies  Allergen Reactions   Penicillins Rash   Sulfa Antibiotics Rash   Current Outpatient Medications on File Prior to Visit  Medication Sig Dispense Refill   acetaminophen (TYLENOL) 650 MG CR tablet Take 1,300 mg by mouth every 8 (eight) hours as needed for pain.     b complex vitamins capsule Take 1 capsule by mouth daily.     Cholecalciferol (VITAMIN D3) 2000 units TABS Take 1 tablet by mouth daily.     FLUoxetine (PROZAC) 20 MG tablet Take 1 tablet (20 mg total) by mouth daily. 90 tablet 3   NON FORMULARY Support hose to the waist for dx of venous insufficiency and edema 15-20 mm Hg     omeprazole (PRILOSEC) 20 MG capsule Take 20 mg by mouth as needed.     No current facility-administered medications on file prior to visit.    Review of Systems  Constitutional:  Negative for activity change, appetite change, fatigue, fever and unexpected weight change.  HENT:  Negative for congestion, ear pain, rhinorrhea, sinus pressure and sore throat.        Throat clearing   Eyes:  Negative for pain, redness and visual disturbance.  Respiratory:  Positive for cough and wheezing. Negative for shortness of breath.   Cardiovascular:  Negative for chest pain and palpitations.  Gastrointestinal:  Negative for abdominal pain, blood in stool, constipation and diarrhea.       Heartburn  Some belching   Endocrine: Negative for polydipsia and polyuria.  Genitourinary:  Negative for dysuria, frequency and urgency.   Musculoskeletal:  Negative for arthralgias, back pain and myalgias.  Skin:  Negative for pallor and rash.  Allergic/Immunologic: Negative for environmental allergies.  Neurological:  Negative for dizziness, syncope and headaches.  Hematological:  Negative for adenopathy. Does not bruise/bleed easily.  Psychiatric/Behavioral:  Negative for decreased concentration and dysphoric mood. The patient is not nervous/anxious.        Objective:   Physical Exam Constitutional:      General: She is not in acute distress.    Appearance: Normal appearance. She is well-developed. She is obese. She is not ill-appearing or diaphoretic.  HENT:     Head: Normocephalic and atraumatic.  Eyes:     General: No scleral icterus.    Conjunctiva/sclera: Conjunctivae normal.     Pupils: Pupils are equal, round, and reactive to light.  Cardiovascular:     Rate and Rhythm: Normal rate and regular rhythm.     Heart sounds: Normal heart sounds.  Pulmonary:     Effort: Pulmonary effort is normal. No respiratory distress.     Breath sounds: Normal breath sounds. No stridor. No wheezing, rhonchi or rales.  Abdominal:     General: Abdomen is protuberant. Bowel sounds are normal. There is no distension.     Palpations: Abdomen is soft. There is no hepatomegaly, splenomegaly or mass.     Tenderness: There is abdominal tenderness in the epigastric area. There is no guarding or rebound. Negative signs include Murphy's sign and McBurney's sign.     Hernia: No hernia is present.  Musculoskeletal:     Cervical back: Normal range of motion and neck supple.  Lymphadenopathy:     Cervical: No cervical adenopathy.  Skin:    General: Skin is warm and dry.     Coloration: Skin is not pale.     Findings: No erythema.  Neurological:     Mental Status: She is alert.           Assessment & Plan:   Problem List Items Addressed This Visit       Digestive   GERD (gastroesophageal reflux disease) - Primary    Worse  lately for unknown reasons - with some throat clearing /cough and reactie airway symptoms  Instructed to continue the omeprazole 20 mg in am  Will add pepcid 20 mg mid am and pm dosing  Discussed diet - given handout Consider wedge pillow for bed Avoid eating late Keep working on weight loss Call back and Er precautions noted in detail today  Follow up 1 month (earlier if worse)   Consider EGD/further work up if needed       Relevant Medications   famotidine (PEPCID) 20 MG tablet

## 2022-11-16 NOTE — Assessment & Plan Note (Signed)
Worse lately for unknown reasons - with some throat clearing /cough and reactie airway symptoms  Instructed to continue the omeprazole 20 mg in am  Will add pepcid 20 mg mid am and pm dosing  Discussed diet - given handout Consider wedge pillow for bed Avoid eating late Keep working on weight loss Call back and Er precautions noted in detail today  Follow up 1 month (earlier if worse)   Consider EGD/further work up if needed

## 2022-11-18 ENCOUNTER — Encounter: Payer: Self-pay | Admitting: Family Medicine

## 2022-11-18 MED ORDER — FLUOXETINE HCL 20 MG PO TABS: 20.0000 mg | ORAL_TABLET | Freq: Every day | ORAL | 1 refills | Status: DC

## 2022-11-18 NOTE — Addendum Note (Signed)
Addended by: Shon Millet on: 11/18/2022 04:18 PM   Modules accepted: Orders

## 2022-11-26 ENCOUNTER — Encounter (INDEPENDENT_AMBULATORY_CARE_PROVIDER_SITE_OTHER): Payer: Self-pay

## 2022-11-27 ENCOUNTER — Other Ambulatory Visit: Payer: Self-pay | Admitting: Oncology

## 2022-11-27 DIAGNOSIS — Z006 Encounter for examination for normal comparison and control in clinical research program: Secondary | ICD-10-CM

## 2022-12-08 ENCOUNTER — Other Ambulatory Visit: Payer: Self-pay | Admitting: Family Medicine

## 2022-12-18 ENCOUNTER — Encounter: Payer: Self-pay | Admitting: Family Medicine

## 2022-12-18 ENCOUNTER — Ambulatory Visit (INDEPENDENT_AMBULATORY_CARE_PROVIDER_SITE_OTHER): Payer: Managed Care, Other (non HMO) | Admitting: Family Medicine

## 2022-12-18 VITALS — BP 122/74 | HR 60 | Temp 97.7°F | Ht 64.0 in | Wt 232.1 lb

## 2022-12-18 DIAGNOSIS — K219 Gastro-esophageal reflux disease without esophagitis: Secondary | ICD-10-CM

## 2022-12-18 DIAGNOSIS — S99922A Unspecified injury of left foot, initial encounter: Secondary | ICD-10-CM | POA: Diagnosis not present

## 2022-12-18 MED ORDER — OMEPRAZOLE 20 MG PO CPDR: 20.0000 mg | DELAYED_RELEASE_CAPSULE | Freq: Two times a day (BID) | ORAL | 1 refills | Status: DC

## 2022-12-18 NOTE — Assessment & Plan Note (Signed)
Not improved with addn of pepcid -stopped it  More discomfort in mid esoph with swallowing  Has changed diet and elevated head of bed   Today Will increase omeprazole to bid  Continue bed head elevation and avoid triggers Ref to GI/ may need EGD ( ? Esophagitis ot stricture)   Call back and Er precautions noted in detail today

## 2022-12-18 NOTE — Assessment & Plan Note (Signed)
Injured this again at work  Has crumbled nail with some bruising No sign of infection   Discussed signs and symptoms of inf to watch for  May loose nail  Keep clean /soap and water and protect  Call back and Er precautions noted in detail today  Update if not starting to improve in a week or if worsening    Consider steel toed shoes for work in future

## 2022-12-18 NOTE — Progress Notes (Unsigned)
Subjective:    Patient ID: Kari Rice, female    DOB: [birth year], 47 y.o.   MRN: [id number]  HPI  Wt Readings from Last 3 Encounters:  12/18/22 232 lb 2 oz (105.3 kg)  11/16/22 235 lb 6 oz (106.8 kg)  10/07/22 243 lb (110.2 kg)   39.84 kg/m  Vitals:   12/18/22 0848  BP: 122/74  Pulse: 60  Temp: 97.7 F (36.5 C)  SpO2: 95%    Pt presents for follow up of GERD Also left great toe (nail) injury  Last visit noted GERD symptoms (incl throat clearing and cough) were worse  Takes omep 20 mg each am We added pepcid 10 mg bid  Discussed diet change Weight loss advised  Also elevation of head of bed   The pepcid did not help at all -she stopped it  For a while was coughing more during the day  Still clearing throat   No abd pain   May have some sinus drainage that makes throat clearing    Eating once daily now- that helps   Worst flare was eating steak  More pain in esoph area- as food passes through   Eating more bland and soft foods   She bought a wedge for bed -helped   A pc of equip at work dropped on her right great toe  That nail is thick/crumbled baseline Had some black discoloration Not painful or red or swollen   Patient Active Problem List   Diagnosis Date Noted   Fall 10/07/2022   Concussion 10/07/2022   Head injury 10/07/2022   Abrasion 10/07/2022   Ankle strain 10/07/2022   Contusion of left hand 10/07/2022   Left shoulder pain 08/31/2022   Lower abdominal pain 06/18/2022   Abnormal urinalysis 06/18/2022   Acquired trigger finger 02/26/2022   Obesity (BMI 30-39.9) 12/16/2021   OSA (obstructive sleep apnea) 12/03/2021   Sigmoid diverticulosis    Colon cancer screening 04/18/2021   Dysthymia 12/26/2019   Right leg swelling 12/11/2019   Fungal nail infection 10/11/2019   Patellofemoral stress syndrome 07/20/2018   Left anterior knee pain 04/26/2018   Injury of left toe 01/25/2018   Left hand paresthesia 11/22/2017   GERD  (gastroesophageal reflux disease) 11/22/2017   H/O vaginitis 08/10/2017   Headache 07/07/2017   Hair loss 03/25/2016   Vitamin D deficiency 01/15/2016   Routine general medical examination at a health care facility 05/29/2014   Encounter for routine gynecological examination 05/29/2014   Screening for HIV (human immunodeficiency virus) 05/29/2014   Encounter for screening mammogram for breast cancer 05/29/2014   Joint pain 04/10/2014   History of DVT (deep vein thrombosis) 07/06/2012   Varicose veins of lower extremities with other complications 07/06/2012   Venous (peripheral) insufficiency 12/23/2011   Phlebitis and thrombophlebitis of the leg 11/06/2011   Venous insufficiency, peripheral 10/26/2011   Pedal edema 10/26/2011   IRREGULAR MENSES 02/29/2008   TRICHOMONAL VAGINITIS 03/18/2007   Past Medical History:  Diagnosis Date   Arthritis    right hand   DVT (deep venous thrombosis) (HCC) 2013   GERD (gastroesophageal reflux disease)    PONV (postoperative nausea and vomiting)    after cholecystectomy   Sleep apnea    CPAP   Varicose veins    Past Surgical History:  Procedure Laterality Date   CHOLECYSTECTOMY  Feb. 2006   Gall Bladder   COLONOSCOPY WITH PROPOFOL N/A 05/23/2021   Procedure: COLONOSCOPY WITH PROPOFOL;  Surgeon: Toney Reil, MD;  Location: [facility] END[facility]COPY;  Service: Gastroenterology;  Laterality: N/A;   HAND SURGERY  2016   NASAL SEPTOPLASTY W/ TURBINOPLASTY Bilateral 10/01/2017   Procedure: NASAL SEPTOPLASTY WITH SUBMUCOCELE RESECTION OF TURBINATE;  Surgeon: Linus Salmons, MD;  Location: [facility];  Service: ENT;  Laterality: Bilateral;  Sleep apnea   ROBOTIC ASSISTED LAPAROSCOPIC HYSTERECTOMY AND SALPINGECTOMY Bilateral 03/13/2020   Procedure: XI ROBOTIC ASSISTED LAPAROSCOPIC HYSTERECTOMY AND SALPINGECTOMY;  Surgeon: Natale Milch, MD;  Location: [facility] OR[facility];  Service: Gynecology;  Laterality: Bilateral;   THROMBECTOMY Right 01/13/12  and  01/21/12   Right iliac vein stent and Vein thrombosis   Social History   Tobacco Use   Smoking status: Never    Passive exposure: Past   Smokeless tobacco: Never  Vaping Use   Vaping status: Never Used  Substance Use Topics   Alcohol use: No    Alcohol/week: 0.0 standard drinks of alcohol   Drug use: No   Family History  Problem Relation Age of Onset   Arthritis Mother    Cancer Mother        uterine    Heart disease Father        Heart Disease before age 76   Cancer Father        skin    Hypertension Father    Heart attack Father    Allergies  Allergen Reactions   Penicillins Rash   Sulfa Antibiotics Rash   Current Outpatient Medications on File Prior to Visit  Medication Sig Dispense Refill   acetaminophen (TYLENOL) 650 MG CR tablet Take 1,300 mg by mouth every 8 (eight) hours as needed for pain.     b complex vitamins capsule Take 1 capsule by mouth daily.     Cholecalciferol (VITAMIN D3) 2000 units TABS Take 1 tablet by mouth daily.     FLUoxetine (PROZAC) 20 MG tablet Take 1 tablet (20 mg total) by mouth daily. 90 tablet 1   NON FORMULARY Support hose to the waist for dx of venous insufficiency and edema 15-20 mm Hg     No current facility-administered medications on file prior to visit.    Review of Systems     Objective:   Physical Exam        Assessment & Plan:   Problem List Items Addressed This Visit       Digestive   GERD (gastroesophageal reflux disease) - Primary    Not improved with addn of pepcid -stopped it  More discomfort in mid esoph with swallowing  Has changed diet and elevated head of bed   Today Will increase omeprazole to bid  Continue bed head elevation and avoid triggers Ref to GI/ may need EGD ( ? Esophagitis ot stricture)   Call back and Er precautions noted in detail today         Relevant Medications   omeprazole (PRILOSEC) 20 MG capsule   Other Relevant Orders   Ambulatory referral to Gastroenterology      Other   Injury of left toe    Injured this again at work  Has crumbled nail with some bruising No sign of infection   Discussed signs and symptoms of inf to watch for  May loose nail  Keep clean /soap and water and protect  Call back and Er precautions noted in detail today  Update if not starting to improve in a week or if worsening    Consider steel toed shoes for work in future

## 2022-12-18 NOTE — Patient Instructions (Signed)
Continue to hold the generic pepcid   Increase omeprazole to twice daily    Keep watching your diet   I put the referral in for GI  Please let us know if you don't hear in 1-2 weeks     In meantime if symptoms worsen let us know

## 2022-12-22 ENCOUNTER — Encounter: Payer: Self-pay | Admitting: Family Medicine

## 2022-12-22 MED ORDER — OMEPRAZOLE 20 MG PO CPDR: 20.0000 mg | DELAYED_RELEASE_CAPSULE | Freq: Two times a day (BID) | ORAL | 1 refills | Status: DC

## 2022-12-24 ENCOUNTER — Emergency Department: Payer: Managed Care, Other (non HMO)

## 2022-12-24 ENCOUNTER — Emergency Department
Admission: EM | Admit: 2022-12-24 | Discharge: 2022-12-24 | Disposition: A | Discharge status: Home / Self Care | Payer: Managed Care, Other (non HMO) | Attending: Student in an Organized Health Care Education/Training Program | Admitting: Student in an Organized Health Care Education/Training Program

## 2022-12-24 ENCOUNTER — Ambulatory Visit
Admission: RE | Admit: 2022-12-24 | Discharge: 2022-12-24 | Disposition: A | Discharge status: Home / Self Care | Payer: Managed Care, Other (non HMO) | Source: Ambulatory Visit | Attending: Emergency Medicine | Admitting: Emergency Medicine

## 2022-12-24 VITALS — BP 123/85 | HR 74 | Temp 98.1°F | Resp 18

## 2022-12-24 DIAGNOSIS — R1031 Right lower quadrant pain: Secondary | ICD-10-CM

## 2022-12-24 LAB — CBC
HCT: 43.1 % (ref 36.0–46.0)
Hemoglobin: 14.4 g/dL (ref 12.0–15.0)
MCH: 29.1 pg (ref 26.0–34.0)
MCHC: 33.4 g/dL (ref 30.0–36.0)
MCV: 87.2 fL (ref 80.0–100.0)
Platelets: 249 10*3/uL (ref 150–400)
RBC: 4.94 MIL/uL (ref 3.87–5.11)
RDW: 12.3 % (ref 11.5–15.5)
WBC: 7.7 10*3/uL (ref 4.0–10.5)
nRBC: 0 % (ref 0.0–0.2)

## 2022-12-24 LAB — URINALYSIS, ROUTINE W REFLEX MICROSCOPIC
Bilirubin Urine: NEGATIVE
Glucose, UA: NEGATIVE mg/dL
Ketones, ur: NEGATIVE mg/dL
Leukocytes,Ua: NEGATIVE
Nitrite: NEGATIVE
Protein, ur: NEGATIVE mg/dL
Specific Gravity, Urine: 1.019 (ref 1.005–1.030)
pH: 6 (ref 5.0–8.0)

## 2022-12-24 LAB — POCT URINALYSIS DIP (MANUAL ENTRY)
Bilirubin, UA: NEGATIVE
Glucose, UA: NEGATIVE mg/dL
Ketones, POC UA: NEGATIVE mg/dL
Leukocytes, UA: NEGATIVE
Nitrite, UA: NEGATIVE
Protein Ur, POC: NEGATIVE mg/dL
Spec Grav, UA: 1.02 (ref 1.010–1.025)
Urobilinogen, UA: 0.2 U/dL
pH, UA: 5.5 (ref 5.0–8.0)

## 2022-12-24 LAB — COMPREHENSIVE METABOLIC PANEL
ALT: 24 U/L (ref 0–44)
AST: 24 U/L (ref 15–41)
Albumin: 4.2 g/dL (ref 3.5–5.0)
Alkaline Phosphatase: 80 U/L (ref 38–126)
Anion gap: 11 (ref 5–15)
BUN: 9 mg/dL (ref 6–20)
CO2: 25 mmol/L (ref 22–32)
Calcium: 9.2 mg/dL (ref 8.9–10.3)
Chloride: 105 mmol/L (ref 98–111)
Creatinine, Ser: 0.65 mg/dL (ref 0.44–1.00)
GFR, Estimated: >60 mL/min (ref >60)
Glucose, Bld: 114 mg/dL — ABNORMAL HIGH (ref 70–99)
Potassium: 3.7 mmol/L (ref 3.5–5.1)
Sodium: 141 mmol/L (ref 135–145)
Total Bilirubin: 0.8 mg/dL (ref 0.3–1.2)
Total Protein: 7.2 g/dL (ref 6.5–8.1)

## 2022-12-24 LAB — LIPASE, BLOOD: Lipase: 30 U/L (ref 11–51)

## 2022-12-24 MED ORDER — IOHEXOL 300 MG/ML  SOLN
100.0000 mL | Freq: Once | INTRAMUSCULAR | Status: AC | PRN
  Administered 2022-12-24: 100 mL via INTRAVENOUS

## 2022-12-24 NOTE — ED Provider Notes (Signed)
[facility] Provider Note    Event Date/Time   First MD Initiated Contact with Patient 12/24/22 1358     (approximate)   History   Abdominal Pain (RLQ)   HPI  Kari Rice is a 47 y.o. female presents to the ER for evaluation of right sided and right lower quadrant abdominal pain.  Went to urgent care this morning after having this pain was instructed to come to the ER.  Symptoms started last night.  Denies any dysuria.  No hematuria.  Some chills but no measured fever.     Physical Exam   Triage Vital Signs: ED Triage Vitals  Encounter Vitals Group     BP 12/24/22 1249 (!) 162/86     Systolic BP Percentile --      Diastolic BP Percentile --      Pulse Rate 12/24/22 1249 76     Resp 12/24/22 1249 18     Temp 12/24/22 1249 97.7 F (36.5 C)     Temp Source 12/24/22 1249 Oral     SpO2 12/24/22 1249 97 %     Weight 12/24/22 1250 231 lb (104.8 kg)     Height 12/24/22 1250 5\' 4"  (1.626 m)     Head Circumference --      Peak Flow --      Pain Score 12/24/22 1250 5     Pain Loc --      Pain Education --      Exclude from Growth Chart --     Most recent vital signs: Vitals:   12/24/22 1249  BP: (!) 162/86  Pulse: 76  Resp: 18  Temp: 97.7 F (36.5 C)  SpO2: 97%     Constitutional: Alert  Eyes: Conjunctivae are normal.  Head: Atraumatic. Nose: No congestion/rhinnorhea. Mouth/Throat: Mucous membranes are moist.   Neck: Painless ROM.  Cardiovascular:   Good peripheral circulation. Respiratory: Normal respiratory effort.  No retractions.  Gastrointestinal: Soft and nontender.  Musculoskeletal:  no deformity Neurologic:  MAE spontaneously. No gross focal neurologic deficits are appreciated.  Skin:  Skin is warm, dry and intact. No rash noted. Psychiatric: Mood and affect are normal. Speech and behavior are normal.    ED Results / Procedures / Treatments   Labs (all labs ordered are listed, but only abnormal results are  displayed) Labs Reviewed  COMPREHENSIVE METABOLIC PANEL - Abnormal; Notable for the following components:      Result Value   Glucose, Bld 114 (*)    All other components within normal limits  URINALYSIS, ROUTINE W REFLEX MICROSCOPIC - Abnormal; Notable for the following components:   Color, Urine YELLOW (*)    APPearance HAZY (*)    Hgb urine dipstick SMALL (*)    Bacteria, UA RARE (*)    All other components within normal limits  LIPASE, BLOOD  CBC     EKG     RADIOLOGY Please see ED Course for my review and interpretation.  I personally reviewed all radiographic images ordered to evaluate for the above acute complaints and reviewed radiology reports and findings.  These findings were personally discussed with the patient.  Please see medical record for radiology report.    PROCEDURES:  Critical Care performed: No  Procedures   MEDICATIONS ORDERED IN ED: Medications  iohexol (OMNIPAQUE) 300 MG/ML solution 100 mL (100 mLs Intravenous Contrast Given 12/24/22 1515)     IMPRESSION / MDM / ASSESSMENT AND PLAN / ED COURSE  I reviewed the  triage vital signs and the nursing notes.                              Differential diagnosis includes, but is not limited to, ascites, colitis, pyelonephritis, diverticulitis, stone, AAA, musculoskeletal strain, hernia  Patient presenting to the ER for evaluation of symptoms as described above.  Based on symptoms, risk factors and considered above differential, this presenting complaint could reflect a potentially life-threatening illness therefore the patient will be placed on continuous pulse oximetry and telemetry for monitoring.  Laboratory evaluation will be sent to evaluate for the above complaints.      Clinical Course as of 12/24/22 1646  Thu Dec 24, 2022  1645 Reassessed.  CT imaging on my review and interpretation without evidence of obstructive pattern.  Formal report with no explanation for patient's acute right-sided  abdominal pain.  She is currently pain-free.  Does appear stable and appropriate for outpatient follow-up.  Patient agreeable plan. [PR]    Clinical Course User Index [PR] Willy Eddy, MD     FINAL CLINICAL IMPRESSION(S) / ED DIAGNOSES   Final diagnoses:  RLQ abdominal pain     Rx / DC Orders   ED Discharge Orders     None        Note:  This document was prepared using Dragon voice recognition software and may include unintentional dictation errors.    Willy Eddy, MD 12/24/22 [phone]

## 2022-12-24 NOTE — Discharge Instructions (Signed)
° °  Go to the emergency department for evaluation of your abdominal pain.

## 2022-12-24 NOTE — ED Triage Notes (Signed)
Patient to Urgent Care with complaints of right sided flank pain that started yesterday afternoon and worsened throughout the night.  Pain worse when lying down then standing. Describes a sharp pain at times but mostly achy. At times pain radiates into her left flank.   No known injury. Denies any urinary symptoms. No hx of kidney stones.

## 2022-12-24 NOTE — ED Notes (Signed)
Patient is being discharged from the Urgent Care and sent to the Emergency Department via POV . Per Wendee Beavers, patient is in need of higher level of care due to RLQ pain. Patient is aware and verbalizes understanding of plan of care.  Vitals:   12/24/22 1204 12/24/22 1206  BP:  123/85  Pulse: 74   Resp: 18   Temp: 98.1 F (36.7 C)   SpO2: 95%

## 2022-12-24 NOTE — ED Triage Notes (Signed)
Pt reports Right flank pain since last night. Pt denies injury, fevers or urinary symptoms.

## 2022-12-24 NOTE — Discharge Instructions (Signed)
You have been seen in the emergency department for emergency care. It is important that you contact your own doctor, specialist or the closest clinic for follow-up care. Please bring this instruction sheet, all medications and X-ray copies with you when you are seen for follow-up care. ° °Determining the exact cause for all patients with abdominal pain is extremely difficult in the emergency department. Our primary focus is to rule-out immediate life-threatening diseases. If no immediate source of pain is found the definitive diagnosis frequently needs to be determined over time.Many times your primary care physician can determine the cause by following the symptoms over time. Sometimes, specialist are required such as Gastroenterologists, Gynecologists, Urologists or Surgeons. Please return °immediately to the Emergency Department for fever>101, Vomiting or Intractable Pain. You should return to the emergency department or see your primary care provider in 12-24hrs if your pain is no better and °sooner if your pain becomes worse. ° °

## 2022-12-24 NOTE — ED Provider Notes (Signed)
Kari Rice    CSN: [id number] Arrival date & time: 12/24/22  1150      History   Chief Complaint Chief Complaint  Patient presents with   Flank Pain    Pain on my right side, not muscular in nature. Hurts when sitting and laying down. Started yesterday afternoon, but pain got worse in the evening. No fever. - Entered by patient    HPI Kari Rice is a 47 y.o. female.  Patient presents with right side abdominal pain since yesterday afternoon.  The pain is getting worse.  It is occasionally sharp but mostly dull.  The pain radiates to the left side at times.  She denies fever, nausea, vomiting, diarrhea, constipation, dysuria, hematuria, or other symptoms.  Medical history includes partial hysterectomy, cholecystectomy, GERD, diverticulosis, DVT.  The history is provided by the patient and medical records.    Past Medical History:  Diagnosis Date   Arthritis    right hand   DVT (deep venous thrombosis) (HCC) 2013   GERD (gastroesophageal reflux disease)    PONV (postoperative nausea and vomiting)    after cholecystectomy   Sleep apnea    CPAP   Varicose veins     Patient Active Problem List   Diagnosis Date Noted   Fall 10/07/2022   Concussion 10/07/2022   Head injury 10/07/2022   Abrasion 10/07/2022   Ankle strain 10/07/2022   Contusion of left hand 10/07/2022   Left shoulder pain 08/31/2022   Lower abdominal pain 06/18/2022   Abnormal urinalysis 06/18/2022   Acquired trigger finger 02/26/2022   Obesity (BMI 30-39.9) 12/16/2021   OSA (obstructive sleep apnea) 12/03/2021   Sigmoid diverticulosis    Colon cancer screening 04/18/2021   Dysthymia 12/26/2019   Right leg swelling 12/11/2019   Fungal nail infection 10/11/2019   Patellofemoral stress syndrome 07/20/2018   Left anterior knee pain 04/26/2018   Injury of left toe 01/25/2018   Left hand paresthesia 11/22/2017   GERD (gastroesophageal reflux disease) 11/22/2017   H/O vaginitis 08/10/2017    Headache 07/07/2017   Hair loss 03/25/2016   Vitamin D deficiency 01/15/2016   Routine general medical examination at a health care facility 05/29/2014   Encounter for routine gynecological examination 05/29/2014   Screening for HIV (human immunodeficiency virus) 05/29/2014   Encounter for screening mammogram for breast cancer 05/29/2014   Joint pain 04/10/2014   History of DVT (deep vein thrombosis) 07/06/2012   Varicose veins of lower extremities with other complications 07/06/2012   Venous (peripheral) insufficiency 12/23/2011   Phlebitis and thrombophlebitis of the leg 11/06/2011   Venous insufficiency, peripheral 10/26/2011   Pedal edema 10/26/2011   IRREGULAR MENSES 02/29/2008   TRICHOMONAL VAGINITIS 03/18/2007    Past Surgical History:  Procedure Laterality Date   CHOLECYSTECTOMY  Feb. 2006   Gall Bladder   COLONOSCOPY WITH PROPOFOL N/A 05/23/2021   Procedure: COLONOSCOPY WITH PROPOFOL;  Surgeon: Toney Reil, MD;  Location: [facility] ENDOSCOPY;  Service: Gastroenterology;  Laterality: N/A;   HAND SURGERY  2016   NASAL SEPTOPLASTY W/ TURBINOPLASTY Bilateral 10/01/2017   Procedure: NASAL SEPTOPLASTY WITH SUBMUCOCELE RESECTION OF TURBINATE;  Surgeon: Linus Salmons, MD;  Location: [facility];  Service: ENT;  Laterality: Bilateral;  Sleep apnea   ROBOTIC ASSISTED LAPAROSCOPIC HYSTERECTOMY AND SALPINGECTOMY Bilateral 03/13/2020   Procedure: XI ROBOTIC ASSISTED LAPAROSCOPIC HYSTERECTOMY AND SALPINGECTOMY;  Surgeon: Natale Milch, MD;  Location: [facility] ORS;  Service: Gynecology;  Laterality: Bilateral;   THROMBECTOMY Right 01/13/12 and  01/21/12   Right iliac vein stent and Vein thrombosis    OB History     Gravida  0   Para  0   Term  0   Preterm  0   AB  0   Living  0      SAB  0   IAB  0   Ectopic  0   Multiple  0   Live Births  0            Home Medications    Prior to Admission medications   Medication Sig Start Date End Date  Taking? Authorizing Provider  acetaminophen (TYLENOL) 650 MG CR tablet Take 1,300 mg by mouth every 8 (eight) hours as needed for pain.    [provider]  b complex vitamins capsule Take 1 capsule by mouth daily.    [provider]  Cholecalciferol (VITAMIN D3) 2000 units TABS Take 1 tablet by mouth daily.    [provider]  FLUoxetine (PROZAC) 20 MG tablet Take 1 tablet (20 mg total) by mouth daily. 11/18/22   Tower, Audrie Gallus, MD  NON FORMULARY Support hose to the waist for dx of venous insufficiency and edema 15-20 mm Hg    [provider]  omeprazole (PRILOSEC) 20 MG capsule Take 1 capsule (20 mg total) by mouth 2 (two) times daily before a meal. 12/22/22   Tower, Audrie Gallus, MD    Family History Family History  Problem Relation Age of Onset   Arthritis Mother    Cancer Mother        uterine    Heart disease Father        Heart Disease before age 92   Cancer Father        skin    Hypertension Father    Heart attack Father     Social History Social History   Tobacco Use   Smoking status: Never    Passive exposure: Past   Smokeless tobacco: Never  Vaping Use   Vaping status: Never Used  Substance Use Topics   Alcohol use: No    Alcohol/week: 0.0 standard drinks of alcohol   Drug use: No     Allergies   Penicillins and Sulfa antibiotics   Review of Systems Review of Systems  Constitutional:  Negative for chills and fever.  Gastrointestinal:  Positive for abdominal pain. Negative for blood in stool, constipation, diarrhea, nausea and vomiting.  Genitourinary:  Negative for dysuria and hematuria.     Physical Exam Triage Vital Signs ED Triage Vitals  Encounter Vitals Group     BP 12/24/22 1206 123/85     Systolic BP Percentile --      Diastolic BP Percentile --      Pulse Rate 12/24/22 1204 74     Resp 12/24/22 1204 18     Temp 12/24/22 1204 98.1 F (36.7 C)     Temp src --      SpO2 12/24/22 1204 95 %     Weight --       Height --      Head Circumference --      Peak Flow --      Pain Score 12/24/22 1201 5     Pain Loc --      Pain Education --      Exclude from Growth Chart --    No data found.  Updated Vital Signs BP 123/85   Pulse 74   Temp 98.1 F (  36.7 C)   Resp 18   LMP  (LMP Unknown)   SpO2 95%   Visual Acuity Right Eye Distance:   Left Eye Distance:   Bilateral Distance:    Right Eye Near:   Left Eye Near:    Bilateral Near:     Physical Exam Constitutional:      General: She is not in acute distress. HENT:     Mouth/Throat:     Mouth: Mucous membranes are moist.  Cardiovascular:     Rate and Rhythm: Normal rate and regular rhythm.     Heart sounds: Normal heart sounds.  Pulmonary:     Effort: Pulmonary effort is normal. No respiratory distress.     Breath sounds: Normal breath sounds.  Abdominal:     General: Bowel sounds are normal.     Palpations: Abdomen is soft.     Tenderness: There is abdominal tenderness in the right lower quadrant. There is no right CVA tenderness, left CVA tenderness, guarding or rebound.     Comments: Tender to palpation of right lower quadrant.  No rebound or guarding.  Skin:    General: Skin is warm and dry.  Neurological:     Mental Status: She is alert.  Psychiatric:        Mood and Affect: Mood normal.        Behavior: Behavior normal.      UC Treatments / Results  Labs (all labs ordered are listed, but only abnormal results are displayed) Labs Reviewed  POCT URINALYSIS DIP (MANUAL ENTRY) - Abnormal; Notable for the following components:      Result Value   Blood, UA small (*)    All other components within normal limits    EKG   Radiology No results found.  Procedures Procedures (including critical care time)  Medications Ordered in UC Medications - No data to display  Initial Impression / Assessment and Plan / UC Course  I have reviewed the triage vital signs and the nursing notes.  Pertinent labs & imaging  results that were available during my care of the patient were reviewed by me and considered in my medical decision making (see chart for details).    Right lower quadrant abdominal pain.  Afebrile and vital signs are stable.  Patient is tender to palpation of her right lower quadrant.  No rebound or guarding.  No CVAT.  Urine has small blood but no signs of infection.  She has history of cholecystectomy and partial hysterectomy.  Patient's abdominal pain has gotten worse overnight.  Sending her to the ED for evaluation.  She drove herself here and feels stable to drive herself to the ED.  She agrees to plan of care.  Final Clinical Impressions(s) / UC Diagnoses   Final diagnoses:  RLQ abdominal pain     Discharge Instructions      Go to the emergency department for evaluation of your abdominal pain.     ED Prescriptions   None    PDMP not reviewed this encounter.   Mickie Bail, NP 12/24/22 1230

## 2022-12-30 ENCOUNTER — Encounter: Payer: Self-pay | Admitting: Family Medicine

## 2022-12-30 ENCOUNTER — Ambulatory Visit: Payer: Managed Care, Other (non HMO) | Admitting: Family Medicine

## 2022-12-30 VITALS — BP 126/72 | HR 65 | Temp 97.9°F | Ht 64.0 in | Wt 223.4 lb

## 2022-12-30 DIAGNOSIS — R1030 Lower abdominal pain, unspecified: Secondary | ICD-10-CM

## 2022-12-30 DIAGNOSIS — K219 Gastro-esophageal reflux disease without esophagitis: Secondary | ICD-10-CM

## 2022-12-30 NOTE — Assessment & Plan Note (Signed)
Continues omeprazole Given # for [facility] to call for appointment, the ref is in  Working on weight loss/ this has helped

## 2022-12-30 NOTE — Patient Instructions (Addendum)
Continue to avoid nuts /seeds and popcorn   A fiber supplement may help- once daily (citrucel or benefiber or metamucil)   If symptoms return let us know   Keep fluids up    Call to schedule your GI appointment   [facility]  [phone]

## 2022-12-30 NOTE — Assessment & Plan Note (Signed)
Recurrent LLQ pain  Not low enough to be ovarian (so far) Seen in ER on 9/12 Reviewed hospital records, lab results and studies in detail  Reviewed last colonoscopy as well Very reassuring work up  Symptoms resolved today   Discussed diet (eating better/ losing weight)  Encouraged more fiber and fluids  Avoids nuts/seeds due to history of tics  Encouraged a fiber supplement daily   Was ref to GI for GERD Will discuss this recurrent pain also  Given # to call [facility] and schedule   Call back and Er precautions noted in detail today   Will let us know if symptoms return   Normal /reassuring exam today

## 2022-12-30 NOTE — Progress Notes (Signed)
Subjective:    Patient ID: Marcello Moores, female    DOB: [birth year], 47 y.o.   MRN: [id number]  HPI  Wt Readings from Last 3 Encounters:  12/30/22 223 lb 6 oz (101.3 kg)  12/24/22 231 lb (104.8 kg)  12/18/22 232 lb 2 oz (105.3 kg)   38.34 kg/m  Vitals:   12/30/22 0925  BP: 126/72  Pulse: 65  Temp: 97.9 F (36.6 C)  SpO2: 96%   Pt presents for follow up of RLQ abd pain  Was seen in ER for this on 9/12  A/p  Clinical Course as of 12/24/22 1646  Thu Dec 24, 2022  1645 Reassessed.  CT imaging on my review and interpretation without evidence of obstructive pattern.  Formal report with no explanation for patient's acute right-sided abdominal pain.  She is currently pain-free.  Does appear stable and appropriate for outpatient follow-up.  Patient agreeable plan. [PR]   Lab Results  Component Value Date   COLORU yellow 12/24/2022   CLARITYU clear 12/24/2022   GLUCOSEUR negative 12/24/2022   BILIRUBINUR NEGATIVE 12/24/2022   KETONESU Negative 06/18/2022   SPECGRAV 1.020 12/24/2022   RBCUR small (A) 12/24/2022   PHUR 5.5 12/24/2022   PROTEINUR NEGATIVE 12/24/2022   UROBILINOGEN 0.2 12/24/2022   LEUKOCYTESUR NEGATIVE 12/24/2022   Lab Results  Component Value Date   NA 141 12/24/2022   K 3.7 12/24/2022   CO2 25 12/24/2022   GLUCOSE 114 (H) 12/24/2022   BUN 9 12/24/2022   CREATININE 0.65 12/24/2022   CALCIUM 9.2 12/24/2022   EGFR 107 06/18/2022   GFRNONAA >60 12/24/2022   Lab Results  Component Value Date   ALT 24 12/24/2022   AST 24 12/24/2022   ALKPHOS 80 12/24/2022   BILITOT 0.8 12/24/2022   Lab Results  Component Value Date   WBC 7.7 12/24/2022   HGB 14.4 12/24/2022   HCT 43.1 12/24/2022   MCV 87.2 12/24/2022   PLT 249 12/24/2022   Lab Results  Component Value Date   LIPASE 30 12/24/2022   CT report: IMPRESSION: 1. No acute intra-abdominal or pelvic pathology. Normal appendix. 2. Sigmoid diverticulosis. No bowel obstruction. 3. Fatty liver.    Last colonoscopy was 05/2021  Diverticulosis noted in sigmoid colon  We referred to GI last time for GERD  [facility]   Today- symptoms are improved but not gone  The pain comes and goes - lasts about 24 hours usually  Wonders if hormonal  Happens once a month  Has had a hysterectomy (partial)   Has not had gyn visit since her surgery   Intentional weight loss Has started to eat less often / often once daily  Feels better doing that   Patient Active Problem List   Diagnosis Date Noted   Fall 10/07/2022   Concussion 10/07/2022   Head injury 10/07/2022   Abrasion 10/07/2022   Ankle strain 10/07/2022   Contusion of left hand 10/07/2022   Left shoulder pain 08/31/2022   Lower abdominal pain 06/18/2022   Abnormal urinalysis 06/18/2022   Acquired trigger finger 02/26/2022   Obesity (BMI 30-39.9) 12/16/2021   OSA (obstructive sleep apnea) 12/03/2021   Sigmoid diverticulosis    Colon cancer screening 04/18/2021   Dysthymia 12/26/2019   Right leg swelling 12/11/2019   Fungal nail infection 10/11/2019   Patellofemoral stress syndrome 07/20/2018   Left anterior knee pain 04/26/2018   Injury of left toe 01/25/2018   Left hand paresthesia 11/22/2017   GERD (gastroesophageal reflux disease)  11/22/2017   H/O vaginitis 08/10/2017   Headache 07/07/2017   Hair loss 03/25/2016   Vitamin D deficiency 01/15/2016   Routine general medical examination at a health care facility 05/29/2014   Encounter for routine gynecological examination 05/29/2014   Screening for HIV (human immunodeficiency virus) 05/29/2014   Encounter for screening mammogram for breast cancer 05/29/2014   Joint pain 04/10/2014   History of DVT (deep vein thrombosis) 07/06/2012   Varicose veins of lower extremities with other complications 07/06/2012   Venous (peripheral) insufficiency 12/23/2011   Phlebitis and thrombophlebitis of the leg 11/06/2011   Venous insufficiency, peripheral 10/26/2011   Pedal edema  10/26/2011   IRREGULAR MENSES 02/29/2008   TRICHOMONAL VAGINITIS 03/18/2007   Past Medical History:  Diagnosis Date   Arthritis    right hand   DVT (deep venous thrombosis) (HCC) 2013   GERD (gastroesophageal reflux disease)    PONV (postoperative nausea and vomiting)    after cholecystectomy   Sleep apnea    CPAP   Varicose veins    Past Surgical History:  Procedure Laterality Date   CHOLECYSTECTOMY  Feb. 2006   Gall Bladder   COLONOSCOPY WITH PROPOFOL N/A 05/23/2021   Procedure: COLONOSCOPY WITH PROPOFOL;  Surgeon: Toney Reil, MD;  Location: [facility] END[facility];  Service: Gastroenterology;  Laterality: N/A;   HAND SURGERY  2016   NASAL SEPTOPLASTY W/ TURBINOPLASTY Bilateral 10/01/2017   Procedure: NASAL SEPTOPLASTY WITH SUBMUCOCELE RESECTION OF TURBINATE;  Surgeon: Linus Salmons, MD;  Location: [facility];  Service: ENT;  Laterality: Bilateral;  Sleep apnea   ROBOTIC ASSISTED LAPAROSCOPIC HYSTERECTOMY AND SALPINGECTOMY Bilateral 03/13/2020   Procedure: XI ROBOTIC ASSISTED LAPAROSCOPIC HYSTERECTOMY AND SALPINGECTOMY;  Surgeon: Natale Milch, MD;  Location: [facility] ORS;  Service: Gynecology;  Laterality: Bilateral;   THROMBECTOMY Right 01/13/12 and  01/21/12   Right iliac vein stent and Vein thrombosis   Social History   Tobacco Use   Smoking status: Never    Passive exposure: Past   Smokeless tobacco: Never  Vaping Use   Vaping status: Never Used  Substance Use Topics   Alcohol use: No    Alcohol/week: 0.0 standard drinks of alcohol   Drug use: No   Family History  Problem Relation Age of Onset   Arthritis Mother    Cancer Mother        uterine    Heart disease Father        Heart Disease before age 53   Cancer Father        skin    Hypertension Father    Heart attack Father    Allergies  Allergen Reactions   Penicillins Rash   Sulfa Antibiotics Rash   Current Outpatient Medications on File Prior to Visit  Medication Sig Dispense Refill    acetaminophen (TYLENOL) 650 MG CR tablet Take 1,300 mg by mouth every 8 (eight) hours as needed for pain.     b complex vitamins capsule Take 1 capsule by mouth daily.     Cholecalciferol (VITAMIN D3) 2000 units TABS Take 1 tablet by mouth daily.     FLUoxetine (PROZAC) 20 MG tablet Take 1 tablet (20 mg total) by mouth daily. 90 tablet 1   NON FORMULARY Support hose to the waist for dx of venous insufficiency and edema 15-20 mm Hg     omeprazole (PRILOSEC) 20 MG capsule Take 1 capsule (20 mg total) by mouth 2 (two) times daily before a meal. 180 capsule 1   No current  facility-administered medications on file prior to visit.    Review of Systems  Constitutional:  Negative for activity change, appetite change, fatigue, fever and unexpected weight change.  HENT:  Negative for congestion, ear pain, rhinorrhea, sinus pressure and sore throat.   Eyes:  Negative for pain, redness and visual disturbance.  Respiratory:  Negative for cough, shortness of breath and wheezing.   Cardiovascular:  Negative for chest pain and palpitations.  Gastrointestinal:  Negative for abdominal distention, abdominal pain, anal bleeding, blood in stool, constipation, diarrhea, nausea, rectal pain and vomiting.       Abd pain is better   Still some GERD symptoms   Endocrine: Negative for polydipsia and polyuria.  Genitourinary:  Negative for dysuria, frequency and urgency.  Musculoskeletal:  Negative for arthralgias, back pain and myalgias.  Skin:  Negative for pallor and rash.  Allergic/Immunologic: Negative for environmental allergies.  Neurological:  Negative for dizziness, syncope and headaches.  Hematological:  Negative for adenopathy. Does not bruise/bleed easily.  Psychiatric/Behavioral:  Negative for decreased concentration and dysphoric mood. The patient is not nervous/anxious.        Objective:   Physical Exam Constitutional:      General: She is not in acute distress.    Appearance: Normal  appearance. She is well-developed. She is obese. She is not ill-appearing or diaphoretic.  HENT:     Head: Normocephalic and atraumatic.  Eyes:     General: No scleral icterus.    Conjunctiva/sclera: Conjunctivae normal.     Pupils: Pupils are equal, round, and reactive to light.  Cardiovascular:     Rate and Rhythm: Normal rate and regular rhythm.     Heart sounds: Normal heart sounds.  Pulmonary:     Effort: Pulmonary effort is normal. No respiratory distress.     Breath sounds: Normal breath sounds. No wheezing or rales.  Abdominal:     General: Abdomen is protuberant. Bowel sounds are normal. There is no distension.     Palpations: Abdomen is soft. There is no fluid wave, hepatomegaly or mass.     Tenderness: There is no abdominal tenderness. There is no guarding or rebound. Negative signs include Murphy's sign and McBurney's sign.     Hernia: No hernia is present.  Musculoskeletal:     Cervical back: Normal range of motion and neck supple.  Lymphadenopathy:     Cervical: No cervical adenopathy.  Skin:    General: Skin is warm and dry.     Coloration: Skin is not pale.     Findings: No erythema.  Neurological:     Mental Status: She is alert.  Psychiatric:        Mood and Affect: Mood normal.           Assessment & Plan:   Problem List Items Addressed This Visit       Digestive   GERD (gastroesophageal reflux disease)    Continues omeprazole Given # for [facility] to call for appointment, the ref is in  Working on weight loss/ this has helped         Other   Lower abdominal pain - Primary    Recurrent LLQ pain  Not low enough to be ovarian (so far) Seen in ER on 9/12 Reviewed hospital records, lab results and studies in detail  Reviewed last colonoscopy as well Very reassuring work up  Symptoms resolved today   Discussed diet (eating better/ losing weight)  Encouraged more fiber and fluids  Avoids nuts/seeds due  to history of tics  Encouraged a  fiber supplement daily   Was ref to GI for GERD Will discuss this recurrent pain also  Given # to call [facility] and schedule   Call back and Er precautions noted in detail today   Will let us know if symptoms return   Normal /reassuring exam today

## 2023-01-13 ENCOUNTER — Telehealth: Payer: Self-pay | Admitting: Pharmacist

## 2023-01-13 ENCOUNTER — Other Ambulatory Visit (HOSPITAL_COMMUNITY): Payer: Self-pay

## 2023-01-13 NOTE — Telephone Encounter (Signed)
Pharmacy Patient Advocate Encounter   Received notification from CoverMyMeds that prior authorization for Omeprazole 20MG  dr capsules is required/requested.   Insurance verification completed.   The patient is insured through [facility] .   Per test claim: PA required; PA submitted to [facility] via CoverMyMeds Key/confirmation #/EOC [facility] Status is pending

## 2023-01-20 ENCOUNTER — Other Ambulatory Visit (HOSPITAL_COMMUNITY): Payer: Self-pay

## 2023-01-20 NOTE — Telephone Encounter (Signed)
Pharmacy Patient Advocate Encounter  Received notification from [facility] that Prior Authorization for Omeprazole 20MG  dr capsules has been APPROVED from 01/13/2023 to 01/13/2024   PA #/Case ID/Reference #: [id number]

## 2023-02-06 ENCOUNTER — Other Ambulatory Visit: Payer: Managed Care, Other (non HMO) | Attending: Oncology

## 2023-02-23 ENCOUNTER — Ambulatory Visit (INDEPENDENT_AMBULATORY_CARE_PROVIDER_SITE_OTHER): Payer: Managed Care, Other (non HMO) | Admitting: Gastroenterology

## 2023-02-23 ENCOUNTER — Encounter: Payer: Self-pay | Admitting: Gastroenterology

## 2023-02-23 VITALS — BP 128/83 | HR 62 | Temp 97.6°F | Ht 64.0 in | Wt 231.4 lb

## 2023-02-23 DIAGNOSIS — K219 Gastro-esophageal reflux disease without esophagitis: Secondary | ICD-10-CM | POA: Diagnosis not present

## 2023-02-23 MED ORDER — OMEPRAZOLE 40 MG PO CPDR: 40.0000 mg | DELAYED_RELEASE_CAPSULE | Freq: Two times a day (BID) | ORAL | 2 refills | Status: DC

## 2023-02-23 NOTE — Progress Notes (Signed)
Arlyss Repress, MD [address]  [address]  [place], [place] [address]  Main: [phone]  Fax: [phone]    Gastroenterology Consultation  Referring Provider:     Judy Pimple, MD Primary Care Physician:  Tower, Audrie Gallus, MD Primary Gastroenterologist:  Dr. Arlyss Repress Reason for Consultation:     Chronic GERD        HPI:   Kari Rice is a 47 y.o. female referred by Dr. Milinda Antis, Audrie Gallus, MD  for consultation & management of chronic GERD.  Patient states that she has been experiencing symptoms of nocturnal heartburn, coughing at night waking up from sleep, difficulty swallowing for last several months, started taking omeprazole 20 mg daily which provided significant relief.  She continues to take omeprazole.  She has slightly modified her eating habits, by avoiding red meat and greasy foods for dinner.  Her weight has been trending up She does not smoke or drink alcohol She denies any family history of esophageal cancer  NSAIDs: None  Antiplts/Anticoagulants/Anti thrombotics: None  GI Procedures:  Colon cancer screening 05/23/2021 - Diverticulosis in the recto- sigmoid colon and in the sigmoid colon. - The distal rectum and anal verge are normal on retroflexion view. - The examination was otherwise normal. - No specimens collected.  Past Medical History:  Diagnosis Date   Arthritis    right hand   DVT (deep venous thrombosis) (HCC) 2013   GERD (gastroesophageal reflux disease)    PONV (postoperative nausea and vomiting)    after cholecystectomy   Sleep apnea    CPAP   Varicose veins     Past Surgical History:  Procedure Laterality Date   CHOLECYSTECTOMY  Feb. 2006   Gall Bladder   COLONOSCOPY WITH PROPOFOL N/A 05/23/2021   Procedure: COLONOSCOPY WITH PROPOFOL;  Surgeon: Toney Reil, MD;  Location: [facility] ENDOSCOPY;  Service: Gastroenterology;  Laterality: N/A;   HAND SURGERY  2016   NASAL SEPTOPLASTY W/ TURBINOPLASTY Bilateral 10/01/2017    Procedure: NASAL SEPTOPLASTY WITH SUBMUCOCELE RESECTION OF TURBINATE;  Surgeon: Linus Salmons, MD;  Location: [facility];  Service: ENT;  Laterality: Bilateral;  Sleep apnea   ROBOTIC ASSISTED LAPAROSCOPIC HYSTERECTOMY AND SALPINGECTOMY Bilateral 03/13/2020   Procedure: XI ROBOTIC ASSISTED LAPAROSCOPIC HYSTERECTOMY AND SALPINGECTOMY;  Surgeon: Natale Milch, MD;  Location: [facility] ORS;  Service: Gynecology;  Laterality: Bilateral;   THROMBECTOMY Right 01/13/12 and  01/21/12   Right iliac vein stent and Vein thrombosis     Current Outpatient Medications:    acetaminophen (TYLENOL) 650 MG CR tablet, Take 1,300 mg by mouth every 8 (eight) hours as needed for pain., Disp: , Rfl:    b complex vitamins capsule, Take 1 capsule by mouth daily., Disp: , Rfl:    Cholecalciferol (VITAMIN D3) 2000 units TABS, Take 1 tablet by mouth daily., Disp: , Rfl:    FLUoxetine (PROZAC) 20 MG tablet, Take 1 tablet (20 mg total) by mouth daily., Disp: 90 tablet, Rfl: 1   NON FORMULARY, Support hose to the waist for dx of venous insufficiency and edema 15-20 mm Hg, Disp: , Rfl:    omeprazole (PRILOSEC) 40 MG capsule, Take 1 capsule (40 mg total) by mouth 2 (two) times daily before a meal., Disp: 60 capsule, Rfl: 2   Family History  Problem Relation Age of Onset   Arthritis Mother    Cancer Mother        uterine    Heart disease Father  Heart Disease before age 58   Cancer Father        skin    Hypertension Father    Heart attack Father      Social History   Tobacco Use   Smoking status: Never    Passive exposure: Past   Smokeless tobacco: Never  Vaping Use   Vaping status: Never Used  Substance Use Topics   Alcohol use: No    Alcohol/week: 0.0 standard drinks of alcohol   Drug use: No    Allergies as of 02/23/2023 - Review Complete 02/23/2023  Allergen Reaction Noted   Penicillins Rash 03/18/2007   Sulfa antibiotics Rash 10/26/2011    Review of Systems:    All systems  reviewed and negative except where noted in HPI.   Physical Exam:  BP 128/83 (BP Location: Right Arm, Patient Position: Sitting, Cuff Size: Large)   Pulse 62   Temp 97.6 F (36.4 C) (Oral)   Ht 5\' 4"  (1.626 m)   Wt 231 lb 6 oz (105 kg)   LMP  (LMP Unknown)   BMI 39.72 kg/m  No LMP recorded (lmp unknown). Patient has had a hysterectomy.  General:   Alert,  Well-developed, well-nourished, pleasant and cooperative in NAD Head:  Normocephalic and atraumatic. Eyes:  Sclera clear, no icterus.   Conjunctiva pink. Ears:  Normal auditory acuity. Nose:  No deformity, discharge, or lesions. Mouth:  No deformity or lesions,oropharynx pink & moist. Neck:  Supple; no masses or thyromegaly. Lungs:  Respirations even and unlabored.  Clear throughout to auscultation.   No wheezes, crackles, or rhonchi. No acute distress. Heart:  Regular rate and rhythm; no murmurs, clicks, rubs, or gallops. Abdomen:  Normal bowel sounds. Soft, non-tender and non-distended without masses, hepatosplenomegaly or hernias noted.  No guarding or rebound tenderness.   Rectal: Not performed Msk:  Symmetrical without gross deformities. Good, equal movement & strength bilaterally. Pulses:  Normal pulses noted. Extremities:  No clubbing or edema.  No cyanosis. Neurologic:  Alert and oriented x3;  grossly normal neurologically. Skin:  Intact without significant lesions or rashes. No jaundice. Psych:  Alert and cooperative. Normal mood and affect.  Imaging Studies: Reviewed  Assessment and Plan:   Kari Rice is a 47 y.o. female with BMI 39.7, seen in consultation for chronic GERD, currently maintained on low-dose omeprazole with some relief  Chronic GERD Discussed about antireflux lifestyle, information provided Increase omeprazole to 40 mg 1-2 times daily before meals for next 8 weeks, then decrease to once a day If her symptoms return, she will call our office back to schedule upper endoscopy   Follow up as  needed, contact via MyChart as needed   Arlyss Repress, MD

## 2023-02-24 ENCOUNTER — Telehealth: Payer: Self-pay

## 2023-02-24 NOTE — Telephone Encounter (Signed)
[employer] and did [place] for Omeprazole 40mg  twice day over the phone. The medication has been approved approval number is [id number] good from 02/24/2023 to 02/24/24

## 2023-03-05 ENCOUNTER — Ambulatory Visit
Admission: RE | Admit: 2023-03-05 | Discharge: 2023-03-05 | Disposition: A | Discharge status: Home / Self Care | Payer: Managed Care, Other (non HMO) | Source: Ambulatory Visit | Attending: Emergency Medicine | Admitting: Emergency Medicine

## 2023-03-05 DIAGNOSIS — R519 Headache, unspecified: Secondary | ICD-10-CM

## 2023-03-05 MED ORDER — KETOROLAC TROMETHAMINE 30 MG/ML IJ SOLN
30.0000 mg | Freq: Once | INTRAMUSCULAR | Status: AC
  Administered 2023-03-05: 30 mg via INTRAMUSCULAR

## 2023-03-05 MED ORDER — DEXAMETHASONE SODIUM PHOSPHATE 10 MG/ML IJ SOLN
10.0000 mg | Freq: Once | INTRAMUSCULAR | Status: AC
  Administered 2023-03-05: 10 mg via INTRAMUSCULAR

## 2023-03-05 NOTE — ED Triage Notes (Signed)
Triaged by provider  

## 2023-03-05 NOTE — ED Provider Notes (Addendum)
Kari Rice    CSN: [id number] Arrival date & time: 03/05/23  1246      History   Chief Complaint Chief Complaint  Patient presents with   Headache    Constant ache when moving or looking at a screen. Brightness sensitivity. Tylenol doesn't help. - Entered by patient    HPI Kari Rice is a 47 y.o. female.   Patient presents for evaluation of a constant left frontal headache present for 2 days, experiencing pressure behind the left eye with associated light sensitivity.  Has attempted use of Tylenol which has been ineffective.  Denies dizziness, lightheadedness, noise sensitivity, nausea or vomiting, vision disturbances.  Endorses this typically occurs at least 1-2 times a year during weather changes.  Denies fever or URI symptoms.  Blood pressure 144/82, heart rate 67, temperature 98.3, O2 saturation 95% on room air, respirations 20  Past Medical History:  Diagnosis Date   Arthritis    right hand   DVT (deep venous thrombosis) (HCC) 2013   GERD (gastroesophageal reflux disease)    PONV (postoperative nausea and vomiting)    after cholecystectomy   Sleep apnea    CPAP   Varicose veins     Patient Active Problem List   Diagnosis Date Noted   Fall 10/07/2022   Concussion 10/07/2022   Head injury 10/07/2022   Abrasion 10/07/2022   Ankle strain 10/07/2022   Contusion of left hand 10/07/2022   Left shoulder pain 08/31/2022   Lower abdominal pain 06/18/2022   Abnormal urinalysis 06/18/2022   Acquired trigger finger 02/26/2022   Obesity (BMI 30-39.9) 12/16/2021   OSA (obstructive sleep apnea) 12/03/2021   Sigmoid diverticulosis    Colon cancer screening 04/18/2021   Dysthymia 12/26/2019   Right leg swelling 12/11/2019   Fungal nail infection 10/11/2019   Patellofemoral stress syndrome 07/20/2018   Left anterior knee pain 04/26/2018   Injury of left toe 01/25/2018   Left hand paresthesia 11/22/2017   GERD (gastroesophageal reflux disease) 11/22/2017    H/O vaginitis 08/10/2017   Headache 07/07/2017   Hair loss 03/25/2016   Vitamin D deficiency 01/15/2016   Routine general medical examination at a health care facility 05/29/2014   Encounter for routine gynecological examination 05/29/2014   Screening for HIV (human immunodeficiency virus) 05/29/2014   Encounter for screening mammogram for breast cancer 05/29/2014   Joint pain 04/10/2014   History of DVT (deep vein thrombosis) 07/06/2012   Varicose veins of bilateral lower extremities with other complications 07/06/2012   Venous (peripheral) insufficiency 12/23/2011   Phlebitis and thrombophlebitis of the leg 11/06/2011   Venous insufficiency, peripheral 10/26/2011   Pedal edema 10/26/2011   IRREGULAR MENSES 02/29/2008   TRICHOMONAL VAGINITIS 03/18/2007    Past Surgical History:  Procedure Laterality Date   CHOLECYSTECTOMY  Feb. 2006   Gall Bladder   COLONOSCOPY WITH PROPOFOL N/A 05/23/2021   Procedure: COLONOSCOPY WITH PROPOFOL;  Surgeon: Toney Reil, MD;  Location: [facility] ENDOSCOPY;  Service: Gastroenterology;  Laterality: N/A;   HAND SURGERY  2016   NASAL SEPTOPLASTY W/ TURBINOPLASTY Bilateral 10/01/2017   Procedure: NASAL SEPTOPLASTY WITH SUBMUCOCELE RESECTION OF TURBINATE;  Surgeon: Linus Salmons, MD;  Location: [facility];  Service: ENT;  Laterality: Bilateral;  Sleep apnea   ROBOTIC ASSISTED LAPAROSCOPIC HYSTERECTOMY AND SALPINGECTOMY Bilateral 03/13/2020   Procedure: XI ROBOTIC ASSISTED LAPAROSCOPIC HYSTERECTOMY AND SALPINGECTOMY;  Surgeon: Natale Milch, MD;  Location: [facility] ORS;  Service: Gynecology;  Laterality: Bilateral;   THROMBECTOMY Right 01/13/12 and  01/21/12   Right iliac vein stent and Vein thrombosis    OB History     Gravida  0   Para  0   Term  0   Preterm  0   AB  0   Living  0      SAB  0   IAB  0   Ectopic  0   Multiple  0   Live Births  0            Home Medications    Prior to Admission medications    Medication Sig Start Date End Date Taking? Authorizing Provider  acetaminophen (TYLENOL) 650 MG CR tablet Take 1,300 mg by mouth every 8 (eight) hours as needed for pain.    [provider]  b complex vitamins capsule Take 1 capsule by mouth daily.    [provider]  Cholecalciferol (VITAMIN D3) 2000 units TABS Take 1 tablet by mouth daily.    [provider]  FLUoxetine (PROZAC) 20 MG tablet Take 1 tablet (20 mg total) by mouth daily. 11/18/22   Tower, Audrie Gallus, MD  NON FORMULARY Support hose to the waist for dx of venous insufficiency and edema 15-20 mm Hg    [provider]  omeprazole (PRILOSEC) 40 MG capsule Take 1 capsule (40 mg total) by mouth 2 (two) times daily before a meal. 02/23/23   Vanga, Loel Dubonnet, MD    Family History Family History  Problem Relation Age of Onset   Arthritis Mother    Cancer Mother        uterine    Heart disease Father        Heart Disease before age 82   Cancer Father        skin    Hypertension Father    Heart attack Father     Social History Social History   Tobacco Use   Smoking status: Never    Passive exposure: Past   Smokeless tobacco: Never  Vaping Use   Vaping status: Never Used  Substance Use Topics   Alcohol use: No    Alcohol/week: 0.0 standard drinks of alcohol   Drug use: No     Allergies   Penicillins and Sulfa antibiotics   Review of Systems Review of Systems   Physical Exam Triage Vital Signs ED Triage Vitals  Encounter Vitals Group     BP      Systolic BP Percentile      Diastolic BP Percentile      Pulse      Resp      Temp      Temp src      SpO2      Weight      Height      Head Circumference      Peak Flow      Pain Score      Pain Loc      Pain Education      Exclude from Growth Chart    No data found.  Updated Vital Signs LMP  (LMP Unknown)   Visual Acuity Right Eye Distance:   Left Eye Distance:   Bilateral Distance:    Right Eye Near:    Left Eye Near:    Bilateral Near:     Physical Exam Constitutional:      Appearance: Normal appearance. She is well-developed.  Eyes:     Extraocular Movements: Extraocular movements intact.     Conjunctiva/sclera: Conjunctivae normal.  Pupils: Pupils are equal, round, and reactive to light.  Pulmonary:     Effort: Pulmonary effort is normal.  Neurological:     General: No focal deficit present.     Mental Status: She is alert and oriented to person, place, and time. Mental status is at baseline.     Cranial Nerves: No cranial nerve deficit.     Sensory: No sensory deficit.     Motor: No weakness.     Coordination: Coordination normal.     Gait: Gait normal.      UC Treatments / Results  Labs (all labs ordered are listed, but only abnormal results are displayed) Labs Reviewed - No data to display  EKG   Radiology No results found.  Procedures Procedures (including critical care time)  Medications Ordered in UC Medications  ketorolac (TORADOL) 30 MG/ML injection 30 mg (has no administration in time range)  dexamethasone (DECADRON) injection 10 mg (has no administration in time range)    Initial Impression / Assessment and Plan / UC Course  I have reviewed the triage vital signs and the nursing notes.  Pertinent labs & imaging results that were available during my care of the patient were reviewed by me and considered in my medical decision making (see chart for details).  Bad Headache  Vitals are stable, patient is in no signs of distress nontoxic-appearing, no URI symptoms most likely not related to infectious cause, no neurological deficits on exam, stable for outpatient management, Toradol and Decadron IM given and recommended continued use of over-the-counter analgesics recommended supportive measures and advised follow-up if symptoms persist or recur, given ER precautions for Final Clinical Impressions(s) / UC Diagnoses   Final diagnoses:  Bad headache      Discharge Instructions      For your headache  -On exam there are no abnormalities neurologically -You have been given an injection of Toradol and Decadron here in the office today to help minimize your symptoms -You may use of ibuprofen and tylenol for management at home -While headaches are present ensure that you are getting adequate rest and adequate fluid intake -Participate in low stimulation activities avoiding bright lights and loud noises when symptoms are present -If your headaches continue to persist please follow-up with your primary doctor for reevaluation -At any point if you have the worst headache of your life please go to the nearest emergency department for immediate evaluation    ED Prescriptions   None    PDMP not reviewed this encounter.   Valinda Hoar, NP 03/05/23 1322    Valinda Hoar, NP 03/05/23 1325

## 2023-03-05 NOTE — Discharge Instructions (Addendum)
For your headache  -On exam there are no abnormalities neurologically -You have been given an injection of Toradol and Decadron here in the office today to help minimize your symptoms -You may use of ibuprofen and tylenol for management at home -While headaches are present ensure that you are getting adequate rest and adequate fluid intake -Participate in low stimulation activities avoiding bright lights and loud noises when symptoms are present -If your headaches continue to persist please follow-up with your primary doctor for reevaluation -At any point if you have the worst headache of your life please go to the nearest emergency department for immediate evaluation

## 2023-04-21 ENCOUNTER — Ambulatory Visit: Payer: Managed Care, Other (non HMO) | Admitting: Family Medicine

## 2023-04-21 VITALS — BP 128/82 | HR 65 | Temp 98.0°F | Ht 64.0 in | Wt 231.1 lb

## 2023-04-21 DIAGNOSIS — R1032 Left lower quadrant pain: Secondary | ICD-10-CM | POA: Diagnosis not present

## 2023-04-21 MED ORDER — METRONIDAZOLE 500 MG PO TABS: 500.0000 mg | ORAL_TABLET | Freq: Three times a day (TID) | ORAL | 0 refills | Status: AC

## 2023-04-21 MED ORDER — CIPROFLOXACIN HCL 500 MG PO TABS
500.0000 mg | ORAL_TABLET | Freq: Two times a day (BID) | ORAL | 0 refills | Status: DC
Start: 2023-04-21 — End: 2023-05-26

## 2023-04-21 NOTE — Progress Notes (Signed)
 Subjective:    Patient ID: Kari Rice, female    DOB: October 29, 1975, 48 y.o.   MRN: [id number]  HPI  Wt Readings from Last 3 Encounters:  04/21/23 231 lb 2 oz (104.8 kg)  02/23/23 231 lb 6 oz (105 kg)  12/30/22 223 lb 6 oz (101.3 kg)   39.67 kg/m  Vitals:   04/21/23 1427  BP: 128/82  Pulse: 65  Temp: 98 F (36.7 C)  SpO2: 96%    Pt presents for LLQ abd pain   Had GI but last week  Diarrhea and some nausea (mild vomiting)  Lasted 3 days  Thinks she had a fever  No blood in stool   Today had a solid stool for the first time   LLQ pain since Saturday-waited to see if it would get better  Sharp pain / Rice left side  Occational radiates to the umbilicus  Worse standing/ better with sitting     Over the counter Took immodium - last dose was on Friday night   Not too gassy  Not too bloated   Eating mainly soft foods until last night- had some beef   No nuts or seeds or corn or popcorn     Has history of recurrent LLQ pain in the past   Diverticulosis on last colonoscopy 2023    Patient Active Problem List   Diagnosis Date Noted   Left Rice quadrant abdominal pain 04/21/2023   Fall 10/07/2022   Concussion 10/07/2022   Head injury 10/07/2022   Abrasion 10/07/2022   Ankle strain 10/07/2022   Contusion of left hand 10/07/2022   Left shoulder pain 08/31/2022   Rice abdominal pain 06/18/2022   Abnormal urinalysis 06/18/2022   Acquired trigger finger 02/26/2022   Obesity (BMI 30-39.9) 12/16/2021   OSA (obstructive sleep apnea) 12/03/2021   Sigmoid diverticulosis    Colon cancer screening 04/18/2021   Dysthymia 12/26/2019   Right leg swelling 12/11/2019   Fungal nail infection 10/11/2019   Patellofemoral stress syndrome 07/20/2018   Left anterior knee pain 04/26/2018   Injury of left toe 01/25/2018   Left hand paresthesia 11/22/2017   GERD (gastroesophageal reflux disease) 11/22/2017   H/O vaginitis 08/10/2017   Headache 07/07/2017   Hair  loss 03/25/2016   Vitamin D  deficiency 01/15/2016   Routine general medical examination at a health care facility 05/29/2014   Encounter for routine gynecological examination 05/29/2014   Screening for HIV (human immunodeficiency virus) 05/29/2014   Encounter for screening mammogram for breast cancer 05/29/2014   Joint pain 04/10/2014   History of DVT (deep vein thrombosis) 07/06/2012   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 07/06/2012   Venous (peripheral) insufficiency 12/23/2011   Phlebitis and thrombophlebitis of the leg 11/06/2011   Venous insufficiency, peripheral 10/26/2011   Pedal edema 10/26/2011   IRREGULAR MENSES 02/29/2008   TRICHOMONAL VAGINITIS 03/18/2007   Past Medical History:  Diagnosis Date   Arthritis    right hand   DVT (deep venous thrombosis) (HCC) 2013   GERD (gastroesophageal reflux disease)    PONV (postoperative nausea and vomiting)    after cholecystectomy   Sleep apnea    CPAP   Varicose veins    Past Surgical History:  Procedure Laterality Date   CHOLECYSTECTOMY  Feb. 2006   Gall Bladder   COLONOSCOPY WITH PROPOFOL  N/A 05/23/2021   Procedure: COLONOSCOPY WITH PROPOFOL ;  Surgeon: Unk Corinn Skiff, MD;  Location: [facility] ENDOSCOPY;  Service: Gastroenterology;  Laterality: N/A;  HAND SURGERY  2016   NASAL SEPTOPLASTY W/ TURBINOPLASTY Bilateral 10/01/2017   Procedure: NASAL SEPTOPLASTY WITH SUBMUCOCELE RESECTION OF TURBINATE;  Surgeon: Herminio Miu, MD;  Location: [facility];  Service: ENT;  Laterality: Bilateral;  Sleep apnea   ROBOTIC ASSISTED LAPAROSCOPIC HYSTERECTOMY AND SALPINGECTOMY Bilateral 03/13/2020   Procedure: XI ROBOTIC ASSISTED LAPAROSCOPIC HYSTERECTOMY AND SALPINGECTOMY;  Surgeon: Victor Claudell SAUNDERS, MD;  Location: [facility] ORS;  Service: Gynecology;  Laterality: Bilateral;   THROMBECTOMY Right 01/13/12 and  01/21/12   Right iliac vein stent and Vein thrombosis   Social History   Tobacco Use    Smoking status: Never    Passive exposure: Past   Smokeless tobacco: Never  Vaping Use   Vaping status: Never Used  Substance Use Topics   Alcohol use: No    Alcohol/week: 0.0 standard drinks of alcohol   Drug use: No   Family History  Problem Relation Age of Onset   Arthritis Mother    Cancer Mother        uterine    Heart disease Father        Heart Disease before age 10   Cancer Father        skin    Hypertension Father    Heart attack Father    Allergies  Allergen Reactions   Penicillins Rash   Sulfa Antibiotics Rash   Current Outpatient Medications on File Prior to Visit  Medication Sig Dispense Refill   acetaminophen  (TYLENOL ) 650 MG CR tablet Take 1,300 mg by mouth every 8 (eight) hours as needed for pain.     b complex vitamins capsule Take 1 capsule by mouth daily.     Cholecalciferol (VITAMIN D3) 2000 units TABS Take 1 tablet by mouth daily.     FLUoxetine  (PROZAC ) 20 MG tablet Take 1 tablet (20 mg total) by mouth daily. 90 tablet 1   NON FORMULARY Support hose to the waist for dx of venous insufficiency and edema 15-20 mm Hg     omeprazole  (PRILOSEC) 40 MG capsule Take 1 capsule (40 mg total) by mouth 2 (two) times daily before a meal. 60 capsule 2   No current facility-administered medications on file prior to visit.    Review of Systems  Constitutional:  Negative for activity change, appetite change, fatigue, fever and unexpected weight change.  HENT:  Negative for congestion, ear pain, rhinorrhea, sinus pressure and sore throat.   Eyes:  Negative for pain, redness and visual disturbance.  Respiratory:  Negative for cough, shortness of breath and wheezing.   Cardiovascular:  Negative for chest pain and palpitations.  Gastrointestinal:  Positive for abdominal pain and diarrhea. Negative for abdominal distention, anal bleeding, blood in stool, constipation, nausea, rectal pain and vomiting.  Endocrine: Negative for polydipsia and polyuria.  Genitourinary:   Negative for dysuria, frequency and urgency.  Musculoskeletal:  Negative for arthralgias, back pain and myalgias.  Skin:  Negative for pallor and rash.  Allergic/Immunologic: Negative for environmental allergies.  Neurological:  Negative for dizziness, syncope and headaches.  Hematological:  Negative for adenopathy. Does not bruise/bleed easily.  Psychiatric/Behavioral:  Negative for decreased concentration and dysphoric mood. The patient is not nervous/anxious.        Objective:   Physical Exam Constitutional:      General: She is not in acute distress.    Appearance: She is well-developed. She is obese. She is not ill-appearing or diaphoretic.  HENT:     Head: Normocephalic and atraumatic.  Eyes:  General: No scleral icterus.    Conjunctiva/sclera: Conjunctivae normal.     Pupils: Pupils are equal, round, and reactive to light.  Cardiovascular:     Rate and Rhythm: Normal rate and regular rhythm.     Heart sounds: Normal heart sounds.  Pulmonary:     Effort: Pulmonary effort is normal. No respiratory distress.     Breath sounds: Normal breath sounds. No wheezing or rales.  Abdominal:     General: Abdomen is protuberant. Bowel sounds are normal. There is no distension.     Palpations: Abdomen is soft. There is no fluid wave, hepatomegaly, splenomegaly, mass or pulsatile mass.     Tenderness: There is abdominal tenderness in the suprapubic area and left Rice quadrant. There is no right CVA tenderness, left CVA tenderness, guarding or rebound. Negative signs include Murphy's sign and McBurney's sign.     Hernia: No hernia is present.     Comments: Focal tenderness over LLQ without rebound or guarding  This extends almost to suprapubic area  Worse discomfort when shaking table  No masses Normal bs    Musculoskeletal:     Cervical back: Normal range of motion and neck supple.  Lymphadenopathy:     Cervical: No cervical adenopathy.  Skin:    General: Skin is warm and dry.      Coloration: Skin is not pale.     Findings: No erythema.  Neurological:     Mental Status: She is alert.  Psychiatric:        Mood and Affect: Mood normal.           Assessment & Plan:   Problem List Items Addressed This Visit       Other   Left Rice quadrant abdominal pain - Primary   S/p viral gastroenteritis last week in pt with known diverticulosis  No fever now  Some worsening with movement but no rebound or guarding on exam  LLQ tenderness noted  Diarrhea is improved   Discussed possible of diverticulitis  Lab today  No signs and symptoms of acute abdomen, if worse would consider CT scan   Pcn allergic (rash) Treatment with cipro  and flagyl  for 7d -discussed possible side effects  Instructed to call if worse / ER if severe  Update if not starting to improve in a several days or if worsening  Call back and Er precautions noted in detail today        Relevant Orders   CBC with Differential/Platelet   Comprehensive metabolic panel

## 2023-04-21 NOTE — Patient Instructions (Signed)
 Drink clear fluids today  Bland diet /avoid nuts/seed and advance slowly   Take the cipro and flagyl as directed for possible diverticulitis   If worse or severe pain or fever or nausea/vomiting -go to the ER    Labs today

## 2023-04-21 NOTE — Assessment & Plan Note (Addendum)
 S/p viral gastroenteritis last week in pt with known diverticulosis  No fever now  Some worsening with movement but no rebound or guarding on exam  LLQ tenderness noted  Diarrhea is improved   Reviewed last colonoscopy noting diverticulosis  Reviewed last GI notes   Discussed possible of diverticulitis  Lab today  No signs and symptoms of acute abdomen, if worse would consider CT scan    Pcn allergic (rash) Treatment with cipro  and flagyl  for 7d -discussed possible side effects  Instructed to call if worse / ER if severe  Update if not starting to improve in a several days or if worsening  Call back and Er precautions noted in detail today

## 2023-04-22 LAB — CBC WITH DIFFERENTIAL/PLATELET
Basophils Absolute: 0 10*3/uL (ref 0.0–0.2)
Basos: 0 %
EOS (ABSOLUTE): 0.3 10*3/uL (ref 0.0–0.4)
Eos: 3 %
Hematocrit: 40 % (ref 34.0–46.6)
Hemoglobin: 13.3 g/dL (ref 11.1–15.9)
Immature Grans (Abs): 0.1 10*3/uL (ref 0.0–0.1)
Immature Granulocytes: 1 %
Lymphocytes Absolute: 3.5 10*3/uL — ABNORMAL HIGH (ref 0.7–3.1)
Lymphs: 35 %
MCH: 28.9 pg (ref 26.6–33.0)
MCHC: 33.3 g/dL (ref 31.5–35.7)
MCV: 87 fL (ref 79–97)
Monocytes Absolute: 0.7 10*3/uL (ref 0.1–0.9)
Monocytes: 7 %
Neutrophils Absolute: 5.5 10*3/uL (ref 1.4–7.0)
Neutrophils: 54 %
Platelets: 291 10*3/uL (ref 150–450)
RBC: 4.6 x10E6/uL (ref 3.77–5.28)
RDW: 12.8 % (ref 11.7–15.4)
WBC: 10 10*3/uL (ref 3.4–10.8)

## 2023-04-22 LAB — COMPREHENSIVE METABOLIC PANEL
ALT: 37 [IU]/L — ABNORMAL HIGH (ref 0–32)
AST: 23 [IU]/L (ref 0–40)
Albumin: 4 g/dL (ref 3.9–4.9)
Alkaline Phosphatase: 85 [IU]/L (ref 44–121)
BUN/Creatinine Ratio: 12 (ref 9–23)
BUN: 7 mg/dL (ref 6–24)
Bilirubin Total: 0.4 mg/dL (ref 0.0–1.2)
CO2: 28 mmol/L (ref 20–29)
Calcium: 8.9 mg/dL (ref 8.7–10.2)
Chloride: 103 mmol/L (ref 96–106)
Creatinine, Ser: 0.59 mg/dL (ref 0.57–1.00)
Globulin, Total: 2.4 g/dL (ref 1.5–4.5)
Glucose: 89 mg/dL (ref 70–99)
Potassium: 3.3 mmol/L — ABNORMAL LOW (ref 3.5–5.2)
Sodium: 147 mmol/L — ABNORMAL HIGH (ref 134–144)
Total Protein: 6.4 g/dL (ref 6.0–8.5)
eGFR: 110 mL/min/{1.73_m2} (ref >59)

## 2023-05-18 ENCOUNTER — Other Ambulatory Visit: Payer: Self-pay | Admitting: Family Medicine

## 2023-05-26 ENCOUNTER — Encounter: Payer: Self-pay | Admitting: Family Medicine

## 2023-05-26 ENCOUNTER — Ambulatory Visit (INDEPENDENT_AMBULATORY_CARE_PROVIDER_SITE_OTHER): Payer: Managed Care, Other (non HMO) | Admitting: Family Medicine

## 2023-05-26 VITALS — BP 128/80 | HR 65 | Temp 98.2°F | Ht 63.75 in | Wt 229.1 lb

## 2023-05-26 DIAGNOSIS — E559 Vitamin D deficiency, unspecified: Secondary | ICD-10-CM | POA: Diagnosis not present

## 2023-05-26 DIAGNOSIS — Z0000 Encounter for general adult medical examination without abnormal findings: Secondary | ICD-10-CM

## 2023-05-26 DIAGNOSIS — K219 Gastro-esophageal reflux disease without esophagitis: Secondary | ICD-10-CM

## 2023-05-26 DIAGNOSIS — F341 Dysthymic disorder: Secondary | ICD-10-CM

## 2023-05-26 DIAGNOSIS — E669 Obesity, unspecified: Secondary | ICD-10-CM

## 2023-05-26 DIAGNOSIS — E538 Deficiency of other specified B group vitamins: Secondary | ICD-10-CM | POA: Insufficient documentation

## 2023-05-26 DIAGNOSIS — Z1231 Encounter for screening mammogram for malignant neoplasm of breast: Secondary | ICD-10-CM

## 2023-05-26 DIAGNOSIS — Z1211 Encounter for screening for malignant neoplasm of colon: Secondary | ICD-10-CM

## 2023-05-26 DIAGNOSIS — Z79899 Other long term (current) drug therapy: Secondary | ICD-10-CM | POA: Insufficient documentation

## 2023-05-26 MED ORDER — ALBUTEROL SULFATE HFA 108 (90 BASE) MCG/ACT IN AERS
2.0000 | INHALATION_SPRAY | Freq: Four times a day (QID) | RESPIRATORY_TRACT | 0 refills | Status: AC | PRN
Start: 2023-05-26 — End: ?

## 2023-05-26 NOTE — Assessment & Plan Note (Signed)
Continues fluoxetine 20 mg daily  Doing well  PHQ 1  Encouraged self care including exercise and outdoor time

## 2023-05-26 NOTE — Assessment & Plan Note (Signed)
B12 today  On ppi Not taking supplement

## 2023-05-26 NOTE — Assessment & Plan Note (Signed)
Colonoscopy 2023 with 10 y recall

## 2023-05-26 NOTE — Patient Instructions (Addendum)
Get your flu shot when you feel better  If you are interested in the new shingles vaccine (Shingrix) - call your local pharmacy to check on coverage and availability    Continue vitamin D3 2000 international units daily   Add some strength training to your routine, this is important for bone and brain health and can reduce your risk of falls and help your body use insulin properly and regulate weight  Light weights, exercise bands , and internet videos are a good way to start  Yoga (chair or regular), machines , floor exercises or a gym with machines are also good options   Labs today       You have an order for:  []   2D Mammogram  [x]   3D Mammogram  []   Bone Density     Please call for appointment:   []   [facility]  [address] [place] [address]  [phone]  []   [facility])   [address]  [place], [place] [address]  [phone]  [x]   [facility]      [address] [place], [place]        [phone]         []   [facility]  [address] [place], [place]  [phone]  []  [facility]   [address]   [place], [place] [address]  [phone]  []  [facility]  [address] [place], [place] [address] [phone]    Make sure to wear two piece clothing  No lotions powders or deodorants the day of the appointment Make sure to bring picture ID and insurance card.  Bring list of medications you are currently taking including any supplements.   Schedule your screening mammogram through MyChart!   [facility] imaging sites can now be scheduled through MyChart.  Log into your MyChart account.  Go to 'Visit' (or 'Appointments' if  on mobile App) --> Schedule an  Appointment  Under 'Select  a Reason for Visit' choose the Mammogram  Screening option.  Complete the pre-visit questions  and select the time and place that  best fits your schedule

## 2023-05-26 NOTE — Assessment & Plan Note (Signed)
Doing better  Down to omeprazole 20 mg daily (from 40 mg bid) Encouraged to avoid triggers

## 2023-05-26 NOTE — Assessment & Plan Note (Signed)
Mammogram ordered Pt will call to schedule  Normal exam

## 2023-05-26 NOTE — Assessment & Plan Note (Signed)
D level today  Takes 2000 international units daily for bone and overall health  Is on ppi

## 2023-05-26 NOTE — Progress Notes (Signed)
Subjective:    Patient ID: Kari Rice, female    DOB: Oct 26, 1975, 48 y.o.   MRN: [id number]  HPI  Here for health maintenance exam and to review chronic medical problems   Wt Readings from Last 3 Encounters:  05/26/23 229 lb 2 oz (103.9 kg)  04/21/23 231 lb 2 oz (104.8 kg)  02/23/23 231 lb 6 oz (105 kg)   39.64 kg/m  Vitals:   05/26/23 0822  BP: 128/80  Pulse: 65  Temp: 98.2 F (36.8 C)  SpO2: 95%    Immunization History  Administered Date(s) Administered   Influenza,inj,Quad PF,6+ Mos 04/30/2014, 01/14/2016, 01/11/2018, 12/05/2018, 12/26/2019, 04/18/2021   PFIZER(Purple Top)SARS-COV-2 Vaccination 06/12/2019, 07/03/2019, 04/01/2020   Td 04/02/2008, 10/07/2022   Tdap 08/10/2017    Health Maintenance Due  Topic Date Due   MAMMOGRAM  09/04/2018   Has a cold today  Started this am    Shingrix vaccine -interested in   Flu shot - has not had yet    Mammogram 08/2017 at the breast center/ wants to get  Self breast exam-no lumps   Gyn health- had a hysterectomy (not for cancer)  Pap 11/2019 [facility] Colposcopy planned with gyn    Colon cancer screening  05/2021 with 10 y recall   Bone health   Falls-none  Fractures-none  Supplements  Last vitamin D Lab Results  Component Value Date   VD25OH 31.0 11/06/2019    Exercise : At work  [employer] / a lot of strength building activity   Has not been to dermatologist  Wants to have spot on leg checked    Mood    05/26/2023    8:29 AM 04/21/2023    2:38 PM 12/30/2022    9:32 AM 12/18/2022    8:58 AM 11/16/2022    4:06 PM  Depression screen PHQ 2/9  Decreased Interest 0 0 0 1 1  Down, Depressed, Hopeless 0 1 0 0 0  PHQ - 2 Score 0 1 0 1 1  Altered sleeping 0 0 0 0 0  Tired, decreased energy 1 0 1 1 1   Change in appetite 0 0 0 0 0  Feeling bad or failure about yourself  0 0 0 1 0  Trouble concentrating 0 0 0 0 0  Moving slowly or fidgety/restless 0 0 0 0 0  Suicidal thoughts 0 0 0 0 0  PHQ-9  Score 1 1 1 3 2   Difficult doing work/chores Not difficult at all Not difficult at all Not difficult at all Not difficult at all Not difficult at all  History of dysthymia  Takes fluoxetine 20 mg daily    GERD Omeprazole 20 mg once daily    Lab Results  Component Value Date   VITAMINB12 170 (L) 11/24/2019        Patient Active Problem List   Diagnosis Date Noted   Current use of proton pump inhibitor 05/26/2023   Vitamin B12 deficiency 05/26/2023   Obesity (BMI 30-39.9) 12/16/2021   OSA (obstructive sleep apnea) 12/03/2021   Sigmoid diverticulosis    Colon cancer screening 04/18/2021   Dysthymia 12/26/2019   Right leg swelling 12/11/2019   Fungal nail infection 10/11/2019   Patellofemoral stress syndrome 07/20/2018   Injury of left toe 01/25/2018   GERD (gastroesophageal reflux disease) 11/22/2017   H/O vaginitis 08/10/2017   Vitamin D deficiency 01/15/2016   Routine general medical examination at a health care facility 05/29/2014   Encounter for screening mammogram for breast cancer 05/29/2014  Joint pain 04/10/2014   History of DVT (deep vein thrombosis) 07/06/2012   Varicose veins of bilateral lower extremities with other complications 07/06/2012   Venous (peripheral) insufficiency 12/23/2011   Phlebitis and thrombophlebitis of the leg 11/06/2011   Venous insufficiency, peripheral 10/26/2011   Pedal edema 10/26/2011   Past Medical History:  Diagnosis Date   Anemia 08/21   Heavy period due to fibroids   Arthritis    right hand   Depression 01/2020   Attended online therapy   DVT (deep venous thrombosis) (HCC) 2013   GERD (gastroesophageal reflux disease)    Oxygen deficiency    PONV (postoperative nausea and vomiting)    after cholecystectomy   Sleep apnea    CPAP   Varicose veins    Past Surgical History:  Procedure Laterality Date   ABDOMINAL HYSTERECTOMY     CHOLECYSTECTOMY  05/2004   Gall Bladder   COLONOSCOPY WITH PROPOFOL N/A 05/23/2021    Procedure: COLONOSCOPY WITH PROPOFOL;  Surgeon: Toney Reil, MD;  Location: [facility];  Service: Gastroenterology;  Laterality: N/A;   HAND SURGERY  2016   NASAL SEPTOPLASTY W/ TURBINOPLASTY Bilateral 10/01/2017   Procedure: NASAL SEPTOPLASTY WITH SUBMUCOCELE RESECTION OF TURBINATE;  Surgeon: Linus Salmons, MD;  Location: [facility];  Service: ENT;  Laterality: Bilateral;  Sleep apnea   ROBOTIC ASSISTED LAPAROSCOPIC HYSTERECTOMY AND SALPINGECTOMY Bilateral 03/13/2020   Procedure: XI ROBOTIC ASSISTED LAPAROSCOPIC HYSTERECTOMY AND SALPINGECTOMY;  Surgeon: Natale Milch, MD;  Location: [facility];  Service: Gynecology;  Laterality: Bilateral;   THROMBECTOMY Right 01/13/12 and  01/21/12   Right iliac vein stent and Vein thrombosis   Social History   Tobacco Use   Smoking status: Never    Passive exposure: Past   Smokeless tobacco: Never  Vaping Use   Vaping status: Never Used  Substance Use Topics   Alcohol use: No   Drug use: Never   Family History  Problem Relation Age of Onset   Arthritis Mother    Cancer Mother        uterine    Heart disease Father        Heart Disease before age 80   Cancer Father        skin    Hypertension Father    Heart attack Father    COPD Father    Obesity Maternal Grandmother    Allergies  Allergen Reactions   Penicillins Rash   Sulfa Antibiotics Rash   Current Outpatient Medications on File Prior to Visit  Medication Sig Dispense Refill   acetaminophen (TYLENOL) 650 MG CR tablet Take 1,300 mg by mouth every 8 (eight) hours as needed for pain.     b complex vitamins capsule Take 1 capsule by mouth daily.     Cholecalciferol (VITAMIN D3) 2000 units TABS Take 1 tablet by mouth daily.     FLUoxetine (PROZAC) 20 MG capsule TAKE 1 CAPSULE(20 MG) BY MOUTH DAILY 90 capsule 1   NON FORMULARY Support hose to the waist for dx of venous insufficiency and edema 15-20 mm Hg     omeprazole (PRILOSEC) 20 MG capsule Take 20 mg  by mouth daily.     No current facility-administered medications on file prior to visit.    Review of Systems  Constitutional:  Positive for fatigue. Negative for activity change, appetite change, fever and unexpected weight change.  HENT:  Positive for congestion and rhinorrhea. Negative for ear pain, sinus pressure and sore throat.   Eyes:  Negative for pain, redness and visual disturbance.  Respiratory:  Positive for cough. Negative for shortness of breath and wheezing.   Cardiovascular:  Negative for chest pain and palpitations.  Gastrointestinal:  Negative for abdominal pain, blood in stool, constipation and diarrhea.  Endocrine: Negative for polydipsia and polyuria.  Genitourinary:  Negative for dysuria, frequency and urgency.  Musculoskeletal:  Negative for arthralgias, back pain and myalgias.  Skin:  Negative for pallor and rash.  Allergic/Immunologic: Negative for environmental allergies.  Neurological:  Negative for dizziness, syncope and headaches.  Hematological:  Negative for adenopathy. Does not bruise/bleed easily.  Psychiatric/Behavioral:  Negative for decreased concentration and dysphoric mood. The patient is not nervous/anxious.        Objective:   Physical Exam Constitutional:      General: She is not in acute distress.    Appearance: Normal appearance. She is well-developed. She is obese. She is not ill-appearing or diaphoretic.  HENT:     Head: Normocephalic and atraumatic.     Right Ear: Tympanic membrane, ear canal and external ear normal.     Left Ear: Tympanic membrane, ear canal and external ear normal.     Nose: Congestion and rhinorrhea present.     Mouth/Throat:     Mouth: Mucous membranes are moist.     Pharynx: Oropharynx is clear. No posterior oropharyngeal erythema.  Eyes:     General: No scleral icterus.    Extraocular Movements: Extraocular movements intact.     Conjunctiva/sclera: Conjunctivae normal.     Pupils: Pupils are equal, round, and  reactive to light.  Neck:     Thyroid: No thyromegaly.     Vascular: No carotid bruit or JVD.  Cardiovascular:     Rate and Rhythm: Normal rate and regular rhythm.     Pulses: Normal pulses.     Heart sounds: Normal heart sounds.     No gallop.  Pulmonary:     Effort: Pulmonary effort is normal. No respiratory distress.     Breath sounds: Normal breath sounds. No wheezing.     Comments: Scant exp wheeze  Chest:     Chest wall: No tenderness.  Abdominal:     General: Bowel sounds are normal. There is no distension or abdominal bruit.     Palpations: Abdomen is soft. There is no mass.     Tenderness: There is no abdominal tenderness.     Hernia: No hernia is present.  Genitourinary:    Comments: Breast exam: No mass, nodules, thickening, tenderness, bulging, retraction, inflamation, nipple discharge or skin changes noted.  No axillary or clavicular LA.     Musculoskeletal:        General: No tenderness. Normal range of motion.     Cervical back: Normal range of motion and neck supple. No rigidity. No muscular tenderness.     Right lower leg: No edema.     Left lower leg: No edema.     Comments: No kyphosis   Lymphadenopathy:     Cervical: No cervical adenopathy.  Skin:    General: Skin is warm and dry.     Coloration: Skin is not pale.     Findings: No erythema or rash.     Comments: Solar lentigines diffusely   Neurological:     Mental Status: She is alert. Mental status is at baseline.     Cranial Nerves: No cranial nerve deficit.     Motor: No abnormal muscle tone.     Coordination: Coordination normal.  Gait: Gait normal.     Deep Tendon Reflexes: Reflexes are normal and symmetric. Reflexes normal.  Psychiatric:        Mood and Affect: Mood normal.        Cognition and Memory: Cognition and memory normal.           Assessment & Plan:   Problem List Items Addressed This Visit       Digestive   GERD (gastroesophageal reflux disease)   Doing better  Down  to omeprazole 20 mg daily (from 40 mg bid) Encouraged to avoid triggers      Relevant Medications   omeprazole (PRILOSEC) 20 MG capsule     Other   Vitamin D deficiency   D level today  Takes 2000 international units daily for bone and overall health  Is on ppi        Relevant Orders   VITAMIN D 25 Hydroxy (Vit-D Deficiency, Fractures)   Vitamin B12 deficiency   B12 today  On ppi Not taking supplement      Routine general medical examination at a health care facility - Primary   Reviewed health habits including diet and exercise and skin cancer prevention Reviewed appropriate screening tests for age  Also reviewed health mt list, fam hx and immunization status , as well as social and family history   See HPI Labs reviewed and ordered Health Maintenance  Topic Date Due   Mammogram  09/04/2018   Flu Shot  07/12/2023*   COVID-19 Vaccine (4 - 2024-25 season) 06/10/2024*   Zoster (Shingles) Vaccine (1 of 2) 08/22/2024*   Hepatitis C Screening  11/12/2029*   Pap with HPV screening  11/22/2024   Colon Cancer Screening  05/24/2031   DTaP/Tdap/Td vaccine (4 - Td or Tdap) 10/06/2032   HIV Screening  Completed   HPV Vaccine  Aged Out  *Topic was postponed. The date shown is not the original due date.   Has a cold today, plans a flu shot when feeling better  Plans to check coverage for shingrix Mammogram ordered-pt will schedule Lab today  Discussed fall prevention, supplements and exercise for bone density  PHQ 1         Relevant Orders   CBC with Differential/Platelet   Comprehensive metabolic panel   Lipid Panel   TSH   Obesity (BMI 30-39.9)   BMI 39.6 Discussed how this problem influences overall health and the risks it imposes  Reviewed plan for weight loss with lower calorie diet (via better food choices (lower glycemic and portion control) along with exercise building up to or more than 30 minutes 5 days per week including some aerobic activity and strength  training         Encounter for screening mammogram for breast cancer   Mammogram ordered Pt will call to schedule Normal exam      Relevant Orders   MM 3D SCREENING MAMMOGRAM BILATERAL BREAST   Dysthymia   Continues fluoxetine 20 mg daily  Doing well  PHQ 1  Encouraged self care including exercise and outdoor time       Current use of proton pump inhibitor   B12 and D added to labs      Relevant Orders   Vitamin B12   VITAMIN D 25 Hydroxy (Vit-D Deficiency, Fractures)   Colon cancer screening   Colonoscopy 2023 with 10 y recall

## 2023-05-26 NOTE — Assessment & Plan Note (Signed)
BMI 39.6 Discussed how this problem influences overall health and the risks it imposes  Reviewed plan for weight loss with lower calorie diet (via better food choices (lower glycemic and portion control) along with exercise building up to or more than 30 minutes 5 days per week including some aerobic activity and strength training

## 2023-05-26 NOTE — Assessment & Plan Note (Signed)
Reviewed health habits including diet and exercise and skin cancer prevention Reviewed appropriate screening tests for age  Also reviewed health mt list, fam hx and immunization status , as well as social and family history   See HPI Labs reviewed and ordered Health Maintenance  Topic Date Due   Mammogram  09/04/2018   Flu Shot  07/12/2023*   COVID-19 Vaccine (4 - 2024-25 season) 06/10/2024*   Zoster (Shingles) Vaccine (1 of 2) 08/22/2024*   Hepatitis C Screening  11/12/2029*   Pap with HPV screening  11/22/2024   Colon Cancer Screening  05/24/2031   DTaP/Tdap/Td vaccine (4 - Td or Tdap) 10/06/2032   HIV Screening  Completed   HPV Vaccine  Aged Out  *Topic was postponed. The date shown is not the original due date.   Has a cold today, plans a flu shot when feeling better  Plans to check coverage for shingrix Mammogram ordered-pt will schedule Lab today  Discussed fall prevention, supplements and exercise for bone density  PHQ 1

## 2023-05-26 NOTE — Assessment & Plan Note (Signed)
B12 and D added to labs

## 2023-05-27 ENCOUNTER — Encounter: Payer: Self-pay | Admitting: Family Medicine

## 2023-05-27 LAB — COMPREHENSIVE METABOLIC PANEL
ALT: 26 [IU]/L (ref 0–32)
AST: 20 [IU]/L (ref 0–40)
Albumin: 4.5 g/dL (ref 3.9–4.9)
Alkaline Phosphatase: 92 [IU]/L (ref 44–121)
BUN/Creatinine Ratio: 10 (ref 9–23)
BUN: 7 mg/dL (ref 6–24)
Bilirubin Total: 0.5 mg/dL (ref 0.0–1.2)
CO2: 25 mmol/L (ref 20–29)
Calcium: 9.8 mg/dL (ref 8.7–10.2)
Chloride: 103 mmol/L (ref 96–106)
Creatinine, Ser: 0.71 mg/dL (ref 0.57–1.00)
Globulin, Total: 2.3 g/dL (ref 1.5–4.5)
Glucose: 87 mg/dL (ref 70–99)
Potassium: 4 mmol/L (ref 3.5–5.2)
Sodium: 142 mmol/L (ref 134–144)
Total Protein: 6.8 g/dL (ref 6.0–8.5)
eGFR: 104 mL/min/{1.73_m2} (ref >59)

## 2023-05-27 LAB — LIPID PANEL
Chol/HDL Ratio: 3.5 {ratio} (ref 0.0–4.4)
Cholesterol, Total: 198 mg/dL (ref 100–199)
HDL: 56 mg/dL (ref >39)
LDL Chol Calc (NIH): 121 mg/dL — ABNORMAL HIGH (ref 0–99)
Triglycerides: 118 mg/dL (ref 0–149)
VLDL Cholesterol Cal: 21 mg/dL (ref 5–40)

## 2023-05-27 LAB — CBC WITH DIFFERENTIAL/PLATELET
Basophils Absolute: 0.1 10*3/uL (ref 0.0–0.2)
Basos: 1 %
EOS (ABSOLUTE): 0.4 10*3/uL (ref 0.0–0.4)
Eos: 4 %
Hematocrit: 44.1 % (ref 34.0–46.6)
Hemoglobin: 14.6 g/dL (ref 11.1–15.9)
Immature Grans (Abs): 0 10*3/uL (ref 0.0–0.1)
Immature Granulocytes: 0 %
Lymphocytes Absolute: 2.6 10*3/uL (ref 0.7–3.1)
Lymphs: 29 %
MCH: 29.3 pg (ref 26.6–33.0)
MCHC: 33.1 g/dL (ref 31.5–35.7)
MCV: 88 fL (ref 79–97)
Monocytes Absolute: 0.5 10*3/uL (ref 0.1–0.9)
Monocytes: 6 %
Neutrophils Absolute: 5.3 10*3/uL (ref 1.4–7.0)
Neutrophils: 60 %
Platelets: 275 10*3/uL (ref 150–450)
RBC: 4.99 x10E6/uL (ref 3.77–5.28)
RDW: 12.9 % (ref 11.7–15.4)
WBC: 8.8 10*3/uL (ref 3.4–10.8)

## 2023-05-27 LAB — VITAMIN D 25 HYDROXY (VIT D DEFICIENCY, FRACTURES): Vit D, 25-Hydroxy: 26.7 ng/mL — ABNORMAL LOW (ref 30.0–100.0)

## 2023-05-27 LAB — TSH: TSH: 2.77 u[IU]/mL (ref 0.450–4.500)

## 2023-05-27 LAB — VITAMIN B12: Vitamin B-12: 239 pg/mL (ref 232–1245)

## 2023-06-09 ENCOUNTER — Telehealth: Payer: Managed Care, Other (non HMO) | Admitting: Physician Assistant

## 2023-06-09 DIAGNOSIS — B9689 Other specified bacterial agents as the cause of diseases classified elsewhere: Secondary | ICD-10-CM

## 2023-06-09 DIAGNOSIS — J019 Acute sinusitis, unspecified: Secondary | ICD-10-CM | POA: Diagnosis not present

## 2023-06-09 MED ORDER — DOXYCYCLINE HYCLATE 100 MG PO TABS: 100.0000 mg | ORAL_TABLET | Freq: Two times a day (BID) | ORAL | 0 refills | Status: DC

## 2023-06-09 NOTE — Progress Notes (Signed)

## 2023-06-16 ENCOUNTER — Ambulatory Visit: Payer: Managed Care, Other (non HMO) | Admitting: Internal Medicine

## 2023-06-16 ENCOUNTER — Encounter: Payer: Self-pay | Admitting: Internal Medicine

## 2023-06-16 VITALS — BP 136/96 | HR 72 | Temp 97.6°F | Ht 63.75 in | Wt 230.4 lb

## 2023-06-16 DIAGNOSIS — J208 Acute bronchitis due to other specified organisms: Secondary | ICD-10-CM | POA: Diagnosis not present

## 2023-06-16 DIAGNOSIS — G4733 Obstructive sleep apnea (adult) (pediatric): Secondary | ICD-10-CM | POA: Diagnosis not present

## 2023-06-16 NOTE — Progress Notes (Signed)
 @Patient  ID: Kari Rice, female    DOB: [birth year], 48 y.o.   MRN: [id number]   TEST/EVENTS :  Home sleep study December 30, 2015 AHI 12.5    SYNOPSIS 48 year old female followed for obstructive sleep apnea .  reestablish for sleep apnea.  Patient presents for a sleep consult today to reestablish for sleep apnea.   Patient was previously diagnosed with sleep apnea in 2017 with a home sleep study that showed mild sleep apnea with AHI of 12.5's/hour.   Patient was started on CPAP.  She was last seen in the office in May 2019.  Caffeine intake is usually 2 cups of coffee daily.  No symptoms suspicious for cataplexy or sleep paralysis.   She uses a nasal mask.  Gets her supplies and CPAP from [place].  She does not use any sleep aids. Medical history significant for Bell's palsy, DVT, GERD, Depression  Surgical history significant for cholecystectomy, nasal septoplasty, hysterectomy Social history patient lives with her mother.  She works in Clinical biochemist at [employer].  She has no children.  She is divorced.  She is a never smoker.  No alcohol or drug use. Family history positive for heart disease, rheumatoid arthritis and cancer.     CC Follow up OSA Follow-up acute viral bronchitis  HPI Follow-up assessment for OSA CPAP download reviewed in detail with patient Excellent compliance report at 100% usage AHI reduced to 0.6 Auto CPAP 5-15 Patient use and benefit from therapy Using CPAP nightly.  pressure setting is comfortable and she is sleeping well.  Patient with acute viral bronchitis that seems to be resolving Patient was prescribed antibiotics and prednisone over the last several weeks Patient using albuterol as needed Diagnosed with COVID in November 2023 Diagnosed with COVID February 2024 No indication for antibiotics or prednisone at this time   Symptoms of bronchospasm Patient is a non-smoker Has not used any inhalers in the past  No exacerbation at this  time No evidence of heart failure at this time No evidence or signs of infection at this time No respiratory distress No fevers, chills, nausea, vomiting, diarrhea No evidence of lower extremity edema No evidence hemoptysis   Allergies  Allergen Reactions   Penicillins Rash   Sulfa Antibiotics Rash    Immunization History  Administered Date(s) Administered   Influenza,inj,Quad PF,6+ Mos 04/30/2014, 01/14/2016, 01/11/2018, 12/05/2018, 12/26/2019, 04/18/2021   PFIZER(Purple Top)SARS-COV-2 Vaccination 06/12/2019, 07/03/2019, 04/01/2020   Td 04/02/2008, 10/07/2022   Tdap 08/10/2017    Past Medical History:  Diagnosis Date   Anemia 08/21   Heavy period due to fibroids   Arthritis    right hand   Depression 01/2020   Attended online therapy   DVT (deep venous thrombosis) (HCC) 2013   GERD (gastroesophageal reflux disease)    Oxygen deficiency    PONV (postoperative nausea and vomiting)    after cholecystectomy   Sleep apnea    CPAP   Varicose veins     Tobacco History: Social History   Tobacco Use  Smoking Status Never   Passive exposure: Past  Smokeless Tobacco Never   Counseling given: Not Answered   Outpatient Medications Prior to Visit  Medication Sig Dispense Refill   acetaminophen (TYLENOL) 650 MG CR tablet Take 1,300 mg by mouth every 8 (eight) hours as needed for pain.     albuterol (VENTOLIN HFA) 108 (90 Base) MCG/ACT inhaler Inhale 2 puffs into the lungs every 6 (six) hours as needed for wheezing or shortness of breath.  8 g 0   b complex vitamins capsule Take 1 capsule by mouth daily.     Cholecalciferol (VITAMIN D3) 2000 units TABS Take 1 tablet by mouth daily.     doxycycline (VIBRA-TABS) 100 MG tablet Take 1 tablet (100 mg total) by mouth 2 (two) times daily. 20 tablet 0   FLUoxetine (PROZAC) 20 MG capsule TAKE 1 CAPSULE(20 MG) BY MOUTH DAILY 90 capsule 1   NON FORMULARY Support hose to the waist for dx of venous insufficiency and edema 15-20 mm Hg      omeprazole (PRILOSEC) 20 MG capsule Take 20 mg by mouth daily.     No facility-administered medications prior to visit.    BP (!) 136/96 (BP Location: Left Arm, Patient Position: Sitting, Cuff Size: Normal)   Pulse 72   Temp 97.6 F (36.4 C) (Temporal)   Ht 5' 3.75" (1.619 m)   Wt 230 lb 6.4 oz (104.5 kg)   LMP  (LMP Unknown)   SpO2 96%   BMI 39.86 kg/m    Review of Systems: Gen:  Denies  fever, sweats, chills weight loss  HEENT: Denies blurred vision, double vision, ear pain, eye pain, hearing loss, nose bleeds, sore throat Cardiac:  No dizziness, chest pain or heaviness, chest tightness,edema, No JVD Resp:   No cough, -sputum production, -shortness of breath,-wheezing, -hemoptysis,  Other:  All other systems negative   Physical Examination:   General Appearance: No distress  EYES PERRLA, EOM intact.   NECK Supple, No JVD Pulmonary: normal breath sounds, No wheezing.  CardiovascularNormal S1,S2.  No m/r/g.   Abdomen: Benign, Soft, non-tender. Neurology UE/LE 5/5 strength, no focal deficits Ext pulses intact, cap refill intact ALL OTHER ROS ARE NEGATIVE      Assessment & Plan:  48 year old pleasant white female seen today for follow-up assessment for sleep apnea with previous AHI of 12.5 with a previous diagnosis of COVID-19 infection several times over the last several years with a history of reactive airways disease ongoing cough and bronchospasms with uncontrolled GERD  Patient with acute viral illness at this time however has been resolving with time and albuterol is not needed    Assessment of Sleep apnea Patient has excellent compliance report Discussed in detail with patient OSA is well-controlled with CPAP Continue current prescription  Patient Instructions Continue to use CPAP every night, minimum of 4-6 hours a night.  Change equipment every 30 days or as directed by DME.  Wash your tubing with warm soap and water daily, hang to dry. Wash  humidifier portion weekly. Use bottled, distilled water and change daily   Be aware of reduced alertness and do not drive or operate heavy machinery if experiencing this or drowsiness.  Exercise encouraged, as tolerated. Encouraged proper weight management.  Important to get eight or more hours of sleep  Limiting the use of the computer and television before bedtime.  Decrease naps during the day, so night time sleep will become enhanced.  Limit caffeine, and sleep deprivation.  HTN, stroke, uncontrolled diabetes and heart failure are potential risk factors.  Risk of untreated sleep apnea including cardiac arrhthymias, stroke, DM, pulm HTN.   Acute viral bronchitis Use albuterol as needed Can use the DayQuil NyQuil as needed No indication for antibiotics or prednisone at this time Symptoms seem to be resolving   Obesity -recommend significant weight loss -recommend changing diet  Deconditioned state -Recommend increased daily activity and exercise  MEDICATION ADJUSTMENTS/LABS AND TESTS ORDERED: Continue CPAP as prescribed Can use  albuterol as needed for Bronchitis Avoid Allergens and Irritants Avoid secondhand smoke Avoid SICK contacts Recommend  Masking  when appropriate Recommend Keep up-to-date with vaccinations   CURRENT MEDICATIONS REVIEWED AT LENGTH WITH PATIENT TODAY   Patient  satisfied with Plan of action and management. All questions answered   Follow up 6 months   I spent a total of 42 minutes reviewing chart data, face-to-face evaluation with the patient, counseling and coordination of care as detailed above.      Lucie Leather, M.D.  [facility]  Medical Director [facility] Medical Director [facility] Cardio-Pulmonary Department

## 2023-06-16 NOTE — Patient Instructions (Signed)
 Excellent Job A+ !!  Continue CPAP as prescribed  Avoid Allergens and Irritants Avoid secondhand smoke Avoid SICK contacts Recommend  Masking  when appropriate Recommend Keep up-to-date with vaccinations   Be aware of reduced alertness and do not drive or operate heavy machinery if experiencing this or drowsiness.  Exercise encouraged, as tolerated. Encouraged proper weight management.  Important to get eight or more hours of sleep  Limiting the use of the computer and television before bedtime.  Decrease naps during the day, so night time sleep will become enhanced.  Limit caffeine, and sleep deprivation.    Can use albuterol as needed for Bronchitis

## 2023-08-18 ENCOUNTER — Telehealth: Payer: Self-pay

## 2023-08-18 NOTE — Telephone Encounter (Signed)
 I spoke with pt; pt said for about one month pt has dry cough mostly in the daytime and wheezing on and off. Pt is not wheezing now and denies in any distress at this time. Pt has no CP,SOB,dizziness or fever. Pt saw pulmonologist in March when sick with respiratory issue and pt was advised then would have cough for a while. Pt has not contacted pulmonology and wants to see PCP. Pt already scheduled to see [facility] 08/19/23 at 11 AM. Pt does not want to schedule sooner appt,. UC & ED precautions given and pt voiced understanding. Sending note to Dr Malissa Se.

## 2023-08-18 NOTE — Telephone Encounter (Signed)
 Aware Will see her then  Agree with ER/UC precautions

## 2023-08-18 NOTE — Telephone Encounter (Signed)
 FW: Appointment scheduled from [facility]  Sent: Wed Aug 18, 2023 11:00 AM  To: P Tower Pool  Cc: [facility]  MRN: [id number] DOB: 01-18-1976  Pt Home: [phone]    Entered: [phone]      Message  FYI: This call has been transferred to Access Nurse. Once the result note has been entered staff can address the message at that time.    Patient called in with the following symptoms:    Red Word: wheezing      Please advise at Mobile [phone] (mobile)    Message is routed to Provider Pool and [facility] Triage        Pt scheduled ov via mychart for tomorrow, 5/8, with Dr. Malissa Se for cough & wheezing. Contacted pt to have triaged, no answer, lvmtcb. Sending note to tower & triage pool.  ----- Message -----  From: Kari Rice  Sent: 08/18/2023   7:53 AM EDT  To: [facility] Admin  Subject: Appointment scheduled from MyChart              Appointment for: Kari Rice ([id number])  Visit type: OFFICE VISIT (8015)  08/19/2023 11:00 AM (30 minutes) with Deri Fleet in [place]    Patient comments:  Persistent cough especially in the morning, occasional during the day. Wheezing through nose and throat.   Appointment scheduled from MyChart ([facility]) View All Conversations on this Encounter [place], Amieya  Tower Pool; [place] Triage36 minutes ago (11:02 AM)   [facility] FYI: This call has been transferred to Access Nurse. Once the result note has been entered staff can address the message at that time.  Patient called in with the following symptoms:  Red Word: wheezing   Please advise at Mobile [phone] (mobile)  Message is routed to Provider Pool and [facility] Triage    Pt scheduled ov via mychart for tomorrow, 5/8, with Dr. Malissa Se for cough & wheezing. Contacted pt to have triaged, no answer, lvmtcb. Sending note to tower & triage pool.     Amieya Culley  Ravenne L Reneau37 minutes  ago (11:00 AM)   AC Hello! We've tried contacting in regards to your appointment with Dr. Malissa Se tomorrow, Thursday, 08/19/23 @ 11:00 AM. We noticed in your appointment notes you mentioned a cough along with wheezing. Please give our office a call back to speak with a triage nurse regarding your symptoms. Our office number is [phone]. Thanks!   This MyChart message has not been read.   Ludwig Safer L Clavijo  P [facility] Admin3 hours ago (7:53 AM)    Appointment for: ALVETTA WIESEN ([id number]) Visit type: OFFICE VISIT (8015) 08/19/2023 11:00 AM (30 minutes) with [facility] in [place]   Patient comments: Persistent cough especially in the morning, occasional during the day. Wheezing through nose and throat.

## 2023-08-19 ENCOUNTER — Encounter: Payer: Self-pay | Admitting: Family Medicine

## 2023-08-19 ENCOUNTER — Ambulatory Visit: Admitting: Family Medicine

## 2023-08-19 VITALS — BP 122/84 | HR 67 | Temp 98.9°F | Ht 63.75 in | Wt 230.5 lb

## 2023-08-19 DIAGNOSIS — J209 Acute bronchitis, unspecified: Secondary | ICD-10-CM | POA: Diagnosis not present

## 2023-08-19 MED ORDER — PREDNISONE 10 MG PO TABS: ORAL_TABLET | ORAL | 0 refills | Status: DC

## 2023-08-19 NOTE — Assessment & Plan Note (Addendum)
 Post viral/ also possibly allergic Wheezing and cough  Reassuring exam otherwise and normal pulse ox   Prescription prednisone  40 mg taper (discussed side effects) Disc symptomatic care - see instructions on AVS  Continue albuterol  mdi as well Update if not starting to improve in a week or if worsening   Watching sinus symptoms- if signs and symptoms of bacterial infection return will call and let us  know     Call back and Er precautions noted in detail today   Handout given

## 2023-08-19 NOTE — Progress Notes (Signed)
 Subjective:    Patient ID: Kari Rice, female    DOB: 17-Jul-1975, 48 y.o.   MRN: [id number]  HPI  Wt Readings from Last 3 Encounters:  08/19/23 230 lb 8 oz (104.6 kg)  06/16/23 230 lb 6.4 oz (104.5 kg)  05/26/23 229 lb 2 oz (103.9 kg)   39.88 kg/m  Vitals:   08/19/23 1054  BP: 122/84  Pulse: 67  Temp: 98.9 F (37.2 C)  SpO2: 96%    Pt presents for symptoms of cough and wheezing for a month   Cough  Worse with talking and laughing  Also at night   Wheezing  No shortness of breath   Virtual visit for sinusitis a mo ago  Prescription antibiotic (neg covid and flu)   doxycycline   Not a steroid  Got better and then worse again  Some left sided sins pain is coming back   Today is back to yellow nasal d/c  Is exposed to illness at work   No fever     Over the counter Albuterol   Flonase   Tessalon  pearles left over  Has some mucinex day and night     Has sleep apnea Sees pulm Dr Auston Left  Using cpap / hard with the cough    Patient Active Problem List   Diagnosis Date Noted   Current use of proton pump inhibitor 05/26/2023   Vitamin B12 deficiency 05/26/2023   Obesity (BMI 30-39.9) 12/16/2021   OSA (obstructive sleep apnea) 12/03/2021   Sigmoid diverticulosis    Colon cancer screening 04/18/2021   Dysthymia 12/26/2019   Right leg swelling 12/11/2019   Fungal nail infection 10/11/2019   Patellofemoral stress syndrome 07/20/2018   Injury of left toe 01/25/2018   GERD (gastroesophageal reflux disease) 11/22/2017   H/O vaginitis 08/10/2017   Vitamin D  deficiency 01/15/2016   Routine general medical examination at a health care facility 05/29/2014   Encounter for screening mammogram for breast cancer 05/29/2014   Joint pain 04/10/2014   Acute bronchitis 01/01/2014   History of DVT (deep vein thrombosis) 07/06/2012   Varicose veins of bilateral lower extremities with other complications 07/06/2012   Venous (peripheral) insufficiency 12/23/2011    Phlebitis and thrombophlebitis of the leg 11/06/2011   Venous insufficiency, peripheral 10/26/2011   Pedal edema 10/26/2011   Past Medical History:  Diagnosis Date   Anemia 08/21   Heavy period due to fibroids   Arthritis    right hand   Depression 01/2020   Attended online therapy   DVT (deep venous thrombosis) (HCC) 2013   GERD (gastroesophageal reflux disease)    Oxygen deficiency    PONV (postoperative nausea and vomiting)    after cholecystectomy   Sleep apnea    CPAP   Varicose veins    Past Surgical History:  Procedure Laterality Date   ABDOMINAL HYSTERECTOMY     CHOLECYSTECTOMY  Feb. 2006   Gall Bladder   COLONOSCOPY WITH PROPOFOL  N/A 05/23/2021   Procedure: COLONOSCOPY WITH PROPOFOL ;  Surgeon: Selena Daily, MD;  Location: [facility];  Service: Gastroenterology;  Laterality: N/A;   HAND SURGERY  2016   NASAL SEPTOPLASTY W/ TURBINOPLASTY Bilateral 10/01/2017   Procedure: NASAL SEPTOPLASTY WITH SUBMUCOCELE RESECTION OF TURBINATE;  Surgeon: Lesly Raspberry, MD;  Location: [facility];  Service: ENT;  Laterality: Bilateral;  Sleep apnea   ROBOTIC ASSISTED LAPAROSCOPIC HYSTERECTOMY AND SALPINGECTOMY Bilateral 03/13/2020   Procedure: XI ROBOTIC ASSISTED LAPAROSCOPIC HYSTERECTOMY AND SALPINGECTOMY;  Surgeon: Heron Lord, MD;  Location: [facility]  ORS;  Service: Gynecology;  Laterality: Bilateral;   THROMBECTOMY Right 01/13/12 and  01/21/12   Right iliac vein stent and Vein thrombosis   Social History   Tobacco Use   Smoking status: Never    Passive exposure: Past   Smokeless tobacco: Never  Vaping Use   Vaping status: Never Used  Substance Use Topics   Alcohol use: No   Drug use: Never   Family History  Problem Relation Age of Onset   Arthritis Mother    Cancer Mother        uterine    Heart disease Father        Heart Disease before age 23   Cancer Father        skin    Hypertension Father    Heart attack Father    COPD Father     Obesity Maternal Grandmother    Allergies  Allergen Reactions   Penicillins Rash   Sulfa Antibiotics Rash   Current Outpatient Medications on File Prior to Visit  Medication Sig Dispense Refill   acetaminophen  (TYLENOL ) 650 MG CR tablet Take 1,300 mg by mouth every 8 (eight) hours as needed for pain.     albuterol  (VENTOLIN  HFA) 108 (90 Base) MCG/ACT inhaler Inhale 2 puffs into the lungs every 6 (six) hours as needed for wheezing or shortness of breath. 8 g 0   b complex vitamins capsule Take 1 capsule by mouth daily.     Cholecalciferol (VITAMIN D3) 2000 units TABS Take 1 tablet by mouth daily.     FLUoxetine  (PROZAC ) 20 MG capsule TAKE 1 CAPSULE(20 MG) BY MOUTH DAILY 90 capsule 1   NON FORMULARY Support hose to the waist for dx of venous insufficiency and edema 15-20 mm Hg     omeprazole  (PRILOSEC) 20 MG capsule Take 20 mg by mouth daily.     No current facility-administered medications on file prior to visit.    Review of Systems  Constitutional:  Negative for activity change, appetite change, fatigue, fever and unexpected weight change.  HENT:  Positive for congestion, sinus pressure and sore throat. Negative for ear pain, postnasal drip, rhinorrhea, sinus pain and voice change.   Eyes:  Negative for pain, redness and visual disturbance.  Respiratory:  Positive for choking and wheezing. Negative for cough and shortness of breath.   Cardiovascular:  Negative for chest pain and palpitations.  Gastrointestinal:  Negative for abdominal pain, blood in stool, constipation and diarrhea.  Endocrine: Negative for polydipsia and polyuria.  Genitourinary:  Negative for dysuria, frequency and urgency.  Musculoskeletal:  Negative for arthralgias, back pain and myalgias.  Skin:  Negative for pallor and rash.  Allergic/Immunologic: Negative for environmental allergies.  Neurological:  Negative for dizziness, syncope and headaches.  Hematological:  Negative for adenopathy. Does not bruise/bleed  easily.  Psychiatric/Behavioral:  Negative for decreased concentration and dysphoric mood. The patient is not nervous/anxious.        Objective:   Physical Exam Constitutional:      General: She is not in acute distress.    Appearance: Normal appearance. She is well-developed. She is obese. She is not ill-appearing, toxic-appearing or diaphoretic.  HENT:     Head: Normocephalic and atraumatic.     Comments: Nares are injected and congested    No sinus tenderness or swelling     Right Ear: Tympanic membrane, ear canal and external ear normal.     Left Ear: Tympanic membrane, ear canal and external ear normal.  Nose: Congestion and rhinorrhea present.     Mouth/Throat:     Mouth: Mucous membranes are moist.     Pharynx: Oropharynx is clear. No oropharyngeal exudate or posterior oropharyngeal erythema.     Comments: Clear pnd  Eyes:     General:        Right eye: No discharge.        Left eye: No discharge.     Conjunctiva/sclera: Conjunctivae normal.     Pupils: Pupils are equal, round, and reactive to light.  Cardiovascular:     Rate and Rhythm: Normal rate.     Heart sounds: Normal heart sounds.  Pulmonary:     Effort: Pulmonary effort is normal. No respiratory distress.     Breath sounds: No stridor. Wheezing and rhonchi present. No rales.     Comments: Scattered rhonchi End exp wheezes No prolonged exp phase  Chest:     Chest wall: No tenderness.  Musculoskeletal:     Cervical back: Normal range of motion and neck supple.  Lymphadenopathy:     Cervical: No cervical adenopathy.  Skin:    General: Skin is warm and dry.     Capillary Refill: Capillary refill takes less than 2 seconds.     Findings: No rash.  Neurological:     Mental Status: She is alert.     Cranial Nerves: No cranial nerve deficit.  Psychiatric:        Mood and Affect: Mood normal.           Assessment & Plan:   Problem List Items Addressed This Visit       Respiratory   Acute  bronchitis - Primary   Post viral/ also possibly allergic Wheezing and cough  Reassuring exam otherwise and normal pulse ox   Prescription prednisone  40 mg taper (discussed side effects) Disc symptomatic care - see instructions on AVS  Continue albuterol  mdi as well Update if not starting to improve in a week or if worsening   Watching sinus symptoms- if signs and symptoms of bacterial infection return will call and let us  know     Call back and Er precautions noted in detail today   Handout given

## 2023-08-19 NOTE — Patient Instructions (Addendum)
 Get back to your mucinex (the DM type helps cough also)  Drink fluids and rest   Nasal saline for congestion as needed  Tylenol  for fever or pain or headache  Please alert us  if symptoms worsen (if severe or short of breath please go to the ER)    If the sinus symptoms worsen let us  know   Take prednisone  as directed  For wheezing/ bronchitis/sinus congestion   Continue using your inhaler    Update if not starting to improve in a week or if worsening

## 2023-09-02 ENCOUNTER — Encounter: Payer: Self-pay | Admitting: Family Medicine

## 2023-09-08 ENCOUNTER — Other Ambulatory Visit: Payer: Self-pay | Admitting: Internal Medicine

## 2023-09-08 ENCOUNTER — Ambulatory Visit: Admitting: Internal Medicine

## 2023-09-08 ENCOUNTER — Other Ambulatory Visit (INDEPENDENT_AMBULATORY_CARE_PROVIDER_SITE_OTHER)

## 2023-09-08 ENCOUNTER — Telehealth: Payer: Self-pay | Admitting: *Deleted

## 2023-09-08 ENCOUNTER — Encounter: Payer: Self-pay | Admitting: Internal Medicine

## 2023-09-08 VITALS — BP 126/80 | HR 70 | Temp 98.3°F | Ht 64.0 in | Wt 232.4 lb

## 2023-09-08 DIAGNOSIS — J4540 Moderate persistent asthma, uncomplicated: Secondary | ICD-10-CM

## 2023-09-08 DIAGNOSIS — Z7722 Contact with and (suspected) exposure to environmental tobacco smoke (acute) (chronic): Secondary | ICD-10-CM

## 2023-09-08 DIAGNOSIS — R5381 Other malaise: Secondary | ICD-10-CM

## 2023-09-08 DIAGNOSIS — G4733 Obstructive sleep apnea (adult) (pediatric): Secondary | ICD-10-CM | POA: Diagnosis not present

## 2023-09-08 DIAGNOSIS — E669 Obesity, unspecified: Secondary | ICD-10-CM

## 2023-09-08 DIAGNOSIS — Z6839 Body mass index (BMI) 39.0-39.9, adult: Secondary | ICD-10-CM

## 2023-09-08 DIAGNOSIS — J45909 Unspecified asthma, uncomplicated: Secondary | ICD-10-CM

## 2023-09-08 LAB — NITRIC OXIDE: Nitric Oxide: 100

## 2023-09-08 MED ORDER — BUDESONIDE-FORMOTEROL FUMARATE 160-4.5 MCG/ACT IN AERO: 2.0000 | INHALATION_SPRAY | Freq: Two times a day (BID) | RESPIRATORY_TRACT | 12 refills | Status: AC

## 2023-09-08 MED ORDER — AZITHROMYCIN 250 MG PO TABS: ORAL_TABLET | ORAL | 0 refills | Status: AC

## 2023-09-08 NOTE — Telephone Encounter (Signed)
 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 He's putting me on Symbicort and having blood work done for allergies. I have a sinus infection but he didn't want to give me anymore steroids since I just finished up a round. Is there any way I could get an antibiotic called in?

## 2023-09-08 NOTE — Telephone Encounter (Signed)
 I sent azithromycin  because she cannot take pcn of sulfa Follow up if not improved I am currently out of town

## 2023-09-08 NOTE — Progress Notes (Signed)
 @Patient  ID: Kari Rice, female    DOB: 26-Oct-1975, 48 y.o.   MRN: [id number]   TEST/EVENTS :  Home sleep study December 30, 2015 AHI 12.5    SYNOPSIS 48 year old female followed for obstructive sleep apnea .  reestablish for sleep apnea.  Patient presents for a sleep consult today to reestablish for sleep apnea.   Patient was previously diagnosed with sleep apnea in 2017 with a home sleep study that showed mild sleep apnea with AHI of 12.5's/hour.   Patient was started on CPAP.  She was last seen in the office in May 2019.  Caffeine intake is usually 2 cups of coffee daily.  No symptoms suspicious for cataplexy or sleep paralysis.   She uses a nasal mask.  Gets her supplies and CPAP from [employer].  She does not use any sleep aids. Medical history significant for Bell's palsy, DVT, GERD, Depression  Surgical history significant for cholecystectomy, nasal septoplasty, hysterectomy Social history patient lives with her mother.  She works in Clinical biochemist at [employer].  She has no children.  She is divorced.  She is a never smoker.  No alcohol or drug use. Family history positive for heart disease, rheumatoid arthritis and cancer.     CC Follow up OSA Signs and symptoms of his asthma  HPI Several months ago Patient with previous acute viral bronchitis  Patient was prescribed antibiotics and prednisone  Patient using albuterol  as needed Diagnosed with COVID in November 2023 Diagnosed with COVID February 2024 Symptoms had resolved  Follow up assessment of Cough and chest congestion Persistent wheezing persistent cough persistent shortness of breath Patient had several rounds of antibiotics and prednisone  over the last several months Patient just finished a course of prednisone  Prednisone  does help her symptoms I have explained to her that findings are suggestive of moderate persistent asthma   Assessment of ASTHMA FeNO 100 ppb-Elevated exhaled Nitric oxide  testing is  highly consistent with type II inflammation -consider inhalers as prescribed  Symptoms of bronchospasm Patient is a non-smoker Has not used any inhalers in the past 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  Encouraged proper weight management.  Discussed driving precautions and its relationship with hypersomnolence.  Discussed sleep hygiene, and benefits of a fixed sleep waked time.  The importance of getting eight or more hours of sleep discussed with patient.  Discussed limiting the use of the computer and television before bedtime.  Decrease naps during the day, so night time sleep will become enhanced.  Limit caffeine, and sleep deprivation.   Patient uses and benefits from therapy Using CPAP nightly and with naps Pressure setting is comfortable and is sleeping well.  Follow-up assessment for OSA CPAP download reviewed in detail with patient Excellent compliance report at 100% usage AHI reduced to 0.9 Auto CPAP 5-15  Current weight 234 pounds at 5 feet 4 inches Recommend weight loss   Allergies  Allergen Reactions   Penicillins Rash   Sulfa Antibiotics Rash    Immunization History  Administered Date(s) Administered   Influenza, Seasonal, Injecte, Preservative Fre 07/24/2023   Influenza,inj,Quad PF,6+ Mos 04/30/2014, 01/14/2016, 01/11/2018, 12/05/2018, 12/26/2019, 04/18/2021   PFIZER(Purple Top)SARS-COV-2 Vaccination 06/12/2019, 07/03/2019, 04/01/2020   Pfizer(Comirnaty)Fall Seasonal Vaccine 12 years and older 07/24/2023   Td 04/02/2008, 10/07/2022   Tdap 08/10/2017   Zoster Recombinant(Shingrix) 07/24/2023    Past Medical History:  Diagnosis Date   Anemia 08/21   Heavy  period due to fibroids   Arthritis    right hand   Depression 01/2020   Attended online therapy   DVT (deep venous thrombosis) (HCC) 2013   GERD (gastroesophageal reflux disease)    Oxygen deficiency    PONV  (postoperative nausea and vomiting)    after cholecystectomy   Sleep apnea    CPAP   Varicose veins     Tobacco History: Social History   Tobacco Use  Smoking Status Never   Passive exposure: Past  Smokeless Tobacco Never   Counseling given: Not Answered   Outpatient Medications Prior to Visit  Medication Sig Dispense Refill   acetaminophen  (TYLENOL ) 650 MG CR tablet Take 1,300 mg by mouth every 8 (eight) hours as needed for pain.     albuterol  (VENTOLIN  HFA) 108 (90 Base) MCG/ACT inhaler Inhale 2 puffs into the lungs every 6 (six) hours as needed for wheezing or shortness of breath. 8 g 0   b complex vitamins capsule Take 1 capsule by mouth daily.     Cholecalciferol (VITAMIN D3) 2000 units TABS Take 1 tablet by mouth daily.     FLUoxetine  (PROZAC ) 20 MG capsule TAKE 1 CAPSULE(20 MG) BY MOUTH DAILY 90 capsule 1   NON FORMULARY Support hose to the waist for dx of venous insufficiency and edema 15-20 mm Hg     omeprazole  (PRILOSEC) 20 MG capsule Take 20 mg by mouth daily.     predniSONE  (DELTASONE ) 10 MG tablet Take 4 pills once daily by mouth for 3 days, then 3 pills daily for 3 days, then 2 pills daily for 3 days then 1 pill daily for 3 days then stop 30 tablet 0   No facility-administered medications prior to visit.    BP 126/80 (BP Location: Right Arm, Patient Position: Sitting, Cuff Size: Large)   Pulse 70   Temp 98.3 F (36.8 C) (Oral)   Ht 5\' 4"  (1.626 m)   Wt 232 lb 6.4 oz (105.4 kg)   LMP  (LMP Unknown)   SpO2 95%   BMI 39.89 kg/m       Review of Systems: Gen:  Denies  fever, sweats, chills weight loss  HEENT: Denies blurred vision, double vision, ear pain, eye pain, hearing loss, nose bleeds, sore throat Cardiac:  No dizziness, chest pain or heaviness, chest tightness,edema, No JVD Resp:   No cough, -sputum production, +shortness of breath,+wheezing, -hemoptysis,  Other:  All other systems negative   Physical Examination:   General Appearance: No  distress  EYES PERRLA, EOM intact.   NECK Supple, No JVD Pulmonary: normal breath sounds, + wheezing.  CardiovascularNormal S1,S2.  No m/r/g.   Abdomen: Benign, Soft, non-tender. Neurology UE/LE 5/5 strength, no focal deficits Ext pulses intact, cap refill intact ALL OTHER ROS ARE NEGATIVE        ASSESSMENT AND PLAN 48 year old pleasant white female seen today for follow-up assessment for sleep apnea with previous AHI of 12.5 with a previous diagnosis of COVID-19 infection several times over the last several years with a history of reactive airways disease ongoing cough and bronchospasms with uncontrolled GERD  Patient with acute viral illness at this time however has been resolving with time and albuterol  is not needed   Assessment of OSA Previous AHI 12.5 Continue CPAP as prescribed  Excellent compliance report Reviewed compliance report in detail with patient Patient definitely benefits the use of CPAP therapy as prescribed Using CPAP nightly and with naps Pressure setting is comfortable and is sleeping well.  CPAP prescription auto 5-15 AHI reduced to 0.9  No evidence of acute heart failure at this time No respiratory distress No fevers, chills, nausea, vomiting, diarrhea No evidence hemoptysis  Patient Instructions Continue to use CPAP every night, minimum of 4-6 hours a night.  Change equipment every 30 days or as directed by DME.  Wash your tubing with warm soap and water daily, hang to dry. Wash humidifier portion weekly. Use bottled, distilled water and change daily   Be aware of reduced alertness and do not drive or operate heavy machinery if experiencing this or drowsiness.  Exercise encouraged, as tolerated. Encouraged proper weight management.  Important to get eight or more hours of sleep  Limiting the use of the computer and television before bedtime.  Decrease naps during the day, so night time sleep will become enhanced.  Limit caffeine, and sleep  deprivation.  HTN, stroke, uncontrolled diabetes and heart failure are potential risk factors.  Risk of untreated sleep apnea including cardiac arrhthymias, stroke, DM, pulm HTN.   Findings c/w ASTHMA moderate persistent post COVID infection Start SYMBICORT INH Use albuterol  as needed Allergy panel ordered Check CBC with differential Check IgE levels Findings may lead to initiation of biological agents Explained to patient in detail    Obesity -recommend significant weight loss -recommend changing diet Currently 234 pounds recommend losing significant amount of weight  Deconditioned state -Recommend increased daily activity and exercise   MEDICATION ADJUSTMENTS/LABS AND TESTS ORDERED: Start Symbicort Rinse mouth after use Wear mask often Continue CPAP as prescribed Can use albuterol  as needed for Bronchitis Avoid Allergens and Irritants Avoid secondhand smoke Avoid SICK contacts Recommend  Masking  when appropriate Recommend Keep up-to-date with vaccinations Allergy panel testing Check CBC IgE levels    CURRENT MEDICATIONS REVIEWED AT LENGTH WITH PATIENT TODAY   Patient  satisfied with Plan of action and management. All questions answered   Follow up 3 weeks   I spent a total of 52 minutes reviewing chart data, face-to-face evaluation with the patient, counseling and coordination of care as detailed above.      Lady Pier, M.D.  [facility]  Medical Director [facility] Medical Director [facility] Cardio-Pulmonary Department

## 2023-09-08 NOTE — Patient Instructions (Signed)
 Findings suggest moderate persistent asthma Obtain CBC with differential IgE levels and allergy panel blood work Start Symbicort 2 puffs in a.m. 2 puffs in p.m. Please rinse mouth after use Continue to use albuterol  as needed Continue Flonase  and Claritin for sinus congestion  Avoid Allergens and Irritants Avoid secondhand smoke Avoid SICK contacts Recommend  Masking  when appropriate Recommend Keep up-to-date with vaccinations  Excellent Job A+ GOLD STAR!!  Continue CPAP as prescribed  Patient Instructions Continue to use CPAP every night, minimum of 4-6 hours a night.  Change equipment every 30 days or as directed by DME.  Wash your tubing with warm soap and water daily, hang to dry. Wash humidifier portion weekly. Use bottled, distilled water and change daily   Be aware of reduced alertness and do not drive or operate heavy machinery if experiencing this or drowsiness.  Exercise encouraged, as tolerated. Encouraged proper weight management.  Important to get eight or more hours of sleep  Limiting the use of the computer and television before bedtime.  Decrease naps during the day, so night time sleep will become enhanced.  Limit caffeine, and sleep deprivation.    Avoid Allergens and Irritants Avoid secondhand smoke Avoid SICK contacts Recommend  Masking  when appropriate Recommend Keep up-to-date with vaccinations   Recommend weight loss

## 2023-09-09 NOTE — Telephone Encounter (Signed)
Sent mychart letting pt know Dr. Tower's comments. 

## 2023-09-10 ENCOUNTER — Telehealth: Payer: Self-pay

## 2023-09-10 LAB — ALLERGY PANEL, ANIMAL GROUP

## 2023-09-10 LAB — ALLERGY PANEL 18, NUT MIX GROUP

## 2023-09-10 LAB — CBC WITH DIFFERENTIAL/PLATELET
Basophils Absolute: 0.1 10*3/uL (ref 0.0–0.2)
Basos: 1 %
EOS (ABSOLUTE): 0.5 10*3/uL — ABNORMAL HIGH (ref 0.0–0.4)
Eos: 6 %
Hematocrit: 43.1 % (ref 34.0–46.6)
Hemoglobin: 14.1 g/dL (ref 11.1–15.9)
Immature Grans (Abs): 0 10*3/uL (ref 0.0–0.1)
Immature Granulocytes: 0 %
Lymphocytes Absolute: 3 10*3/uL (ref 0.7–3.1)
Lymphs: 37 %
MCH: 29.6 pg (ref 26.6–33.0)
MCHC: 32.7 g/dL (ref 31.5–35.7)
MCV: 90 fL (ref 79–97)
Monocytes Absolute: 0.4 10*3/uL (ref 0.1–0.9)
Monocytes: 5 %
Neutrophils Absolute: 4.2 10*3/uL (ref 1.4–7.0)
Neutrophils: 51 %
Platelets: 252 10*3/uL (ref 150–450)
RBC: 4.77 x10E6/uL (ref 3.77–5.28)
RDW: 13 % (ref 11.7–15.4)
WBC: 8.1 10*3/uL (ref 3.4–10.8)

## 2023-09-10 LAB — ALLERGY PANEL 19, SEAFOOD GROUP

## 2023-09-10 LAB — IGE: IgE (Immunoglobulin E), Serum: 18 [IU]/mL (ref 6–495)

## 2023-09-10 LAB — SPECIMEN STATUS REPORT

## 2023-09-10 NOTE — Telephone Encounter (Signed)
 Spoke to Janelle with [facility]. She stated that [facility] does not have a allergy panel for Cereal.  Janelle also stated that she could not locate the allergy panel for grass. I have provided Janelle with the order number.   Routing to Dr. Auston Left as an Arlie Lain.

## 2023-09-22 ENCOUNTER — Encounter: Payer: Self-pay | Admitting: Family Medicine

## 2023-09-22 ENCOUNTER — Ambulatory Visit: Admitting: Family Medicine

## 2023-09-22 VITALS — BP 122/78 | HR 70 | Temp 98.3°F | Ht 64.0 in | Wt 233.0 lb

## 2023-09-22 DIAGNOSIS — M542 Cervicalgia: Secondary | ICD-10-CM

## 2023-09-22 DIAGNOSIS — E559 Vitamin D deficiency, unspecified: Secondary | ICD-10-CM | POA: Diagnosis not present

## 2023-09-22 DIAGNOSIS — E538 Deficiency of other specified B group vitamins: Secondary | ICD-10-CM | POA: Diagnosis not present

## 2023-09-22 DIAGNOSIS — Z79899 Other long term (current) drug therapy: Secondary | ICD-10-CM

## 2023-09-22 DIAGNOSIS — R5382 Chronic fatigue, unspecified: Secondary | ICD-10-CM | POA: Diagnosis not present

## 2023-09-22 DIAGNOSIS — N951 Menopausal and female climacteric states: Secondary | ICD-10-CM | POA: Insufficient documentation

## 2023-09-22 DIAGNOSIS — R5383 Other fatigue: Secondary | ICD-10-CM | POA: Insufficient documentation

## 2023-09-22 NOTE — Assessment & Plan Note (Signed)
 FSH , LH today  Fatigue  Aches and pains   Not candidate for HRT due to history of blood clot

## 2023-09-22 NOTE — Assessment & Plan Note (Addendum)
 B12 level today Fatigue  Taking 500 mcg daily

## 2023-09-22 NOTE — Patient Instructions (Signed)
 Labs today for fatigue and hormones  Stay very well hydrated Watch for fever or other symptoms  Watch for tick bites    Use heat/ice on neck  Continue supportive pillow

## 2023-09-22 NOTE — Assessment & Plan Note (Signed)
 Vague -suspect muscular  Not positional Some bilateral (worse on left) cervical muscle tenderness  No bony tenderness Advised ice/heat  Supportive pillow  ? Consider PT if not improved  If worse- imaging

## 2023-09-22 NOTE — Assessment & Plan Note (Signed)
 D and B12 levels today in setting of fatigue

## 2023-09-22 NOTE — Assessment & Plan Note (Signed)
 D level today  Is taking D Fatigue

## 2023-09-22 NOTE — Progress Notes (Signed)
 Subjective:    Patient ID: Kari Rice, female    DOB: 15-Jul-1975, 48 y.o.   MRN: [id number]  HPI  Wt Readings from Last 3 Encounters:  09/22/23 233 lb (105.7 kg)  09/08/23 232 lb 6.4 oz (105.4 kg)  08/19/23 230 lb 8 oz (104.6 kg)   39.99 kg/m  Vitals:   09/22/23 1456  BP: 122/78  Pulse: 70  Temp: 98.3 F (36.8 C)  SpO2: 96%    Pt presents with c/o  Fatigue Neck pain    Saturday -very tired/ slept for 4 hours in the middle of the day  Foggy brain  Little bit of body aches / stiffness  Joints more than muscles   Sides of her neck feel more tender than usual Normal range of motion  No pain in middle  No headache    Hits a wall at work after 3-4 hours (work is physical)  Usually gets sweaty - did not sweat today   -- is pushing the water   No recent emotional or physical stress  Had bronchitis last month / sinus infx   (zpack helped)  Dr Auston Left told her asthma     Had hysterectomy (partial)  Not on hormones  Does not have a gyn     Has sleep apnea -well controlled /cpap / just had re check and told she did well  Does feel rested when she first wakes up   Takes fluoxetine  20 mg for mood  Mood has not changed  Not anxious    Lab Results  Component Value Date   VITAMINB12 239 05/26/2023   Last vitamin D  Lab Results  Component Value Date   VD25OH 26.7 (L) 05/26/2023   Lab Results  Component Value Date   NA 142 05/26/2023   K 4.0 05/26/2023   CO2 25 05/26/2023   GLUCOSE 87 05/26/2023   BUN 7 05/26/2023   CREATININE 0.71 05/26/2023   CALCIUM  9.8 05/26/2023   EGFR 104 05/26/2023   GFRNONAA >60 12/24/2022   Lab Results  Component Value Date   ALT 26 05/26/2023   AST 20 05/26/2023   ALKPHOS 92 05/26/2023   BILITOT 0.5 05/26/2023   Lab Results  Component Value Date   WBC 8.1 09/08/2023   HGB 14.1 09/08/2023   HCT 43.1 09/08/2023   MCV 90 09/08/2023   PLT 252 09/08/2023   Lab Results  Component Value Date   IRON 52 11/24/2019    TIBC 361 11/24/2019   FERRITIN 24 11/24/2019       Patient Active Problem List   Diagnosis Date Noted   Menopausal symptoms 09/22/2023   Fatigue 09/22/2023   Current use of proton pump inhibitor 05/26/2023   Vitamin B12 deficiency 05/26/2023   Obesity (BMI 30-39.9) 12/16/2021   OSA (obstructive sleep apnea) 12/03/2021   Sigmoid diverticulosis    Colon cancer screening 04/18/2021   Dysthymia 12/26/2019   Right leg swelling 12/11/2019   Fungal nail infection 10/11/2019   Patellofemoral stress syndrome 07/20/2018   Injury of left toe 01/25/2018   GERD (gastroesophageal reflux disease) 11/22/2017   H/O vaginitis 08/10/2017   Vitamin D  deficiency 01/15/2016   Routine general medical examination at a health care facility 05/29/2014   Encounter for screening mammogram for breast cancer 05/29/2014   Joint pain 04/10/2014   History of DVT (deep vein thrombosis) 07/06/2012   Varicose veins of bilateral lower extremities with other complications 07/06/2012   Venous (peripheral) insufficiency 12/23/2011   Phlebitis and thrombophlebitis  of the leg 11/06/2011   Venous insufficiency, peripheral 10/26/2011   Pedal edema 10/26/2011   Neck pain 10/21/2009   Past Medical History:  Diagnosis Date   Anemia 08/21   Heavy period due to fibroids   Arthritis    right hand   Depression 01/2020   Attended online therapy   DVT (deep venous thrombosis) (HCC) 2013   GERD (gastroesophageal reflux disease)    Oxygen deficiency    PONV (postoperative nausea and vomiting)    after cholecystectomy   Sleep apnea    CPAP   Varicose veins    Past Surgical History:  Procedure Laterality Date   ABDOMINAL HYSTERECTOMY     CHOLECYSTECTOMY  Feb. 2006   Gall Bladder   COLONOSCOPY WITH PROPOFOL  N/A 05/23/2021   Procedure: COLONOSCOPY WITH PROPOFOL ;  Surgeon: Selena Daily, MD;  Location: [facility];  Service: Gastroenterology;  Laterality: N/A;   HAND SURGERY  2016   NASAL SEPTOPLASTY W/  TURBINOPLASTY Bilateral 10/01/2017   Procedure: NASAL SEPTOPLASTY WITH SUBMUCOCELE RESECTION OF TURBINATE;  Surgeon: Lesly Raspberry, MD;  Location: [facility];  Service: ENT;  Laterality: Bilateral;  Sleep apnea   ROBOTIC ASSISTED LAPAROSCOPIC HYSTERECTOMY AND SALPINGECTOMY Bilateral 03/13/2020   Procedure: XI ROBOTIC ASSISTED LAPAROSCOPIC HYSTERECTOMY AND SALPINGECTOMY;  Surgeon: Heron Lord, MD;  Location: [facility];  Service: Gynecology;  Laterality: Bilateral;   THROMBECTOMY Right 01/13/12 and  01/21/12   Right iliac vein stent and Vein thrombosis   Social History   Tobacco Use   Smoking status: Never    Passive exposure: Past   Smokeless tobacco: Never  Vaping Use   Vaping status: Never Used  Substance Use Topics   Alcohol use: No   Drug use: Never   Family History  Problem Relation Age of Onset   Arthritis Mother    Cancer Mother        uterine    Heart disease Father        Heart Disease before age 60   Cancer Father        skin    Hypertension Father    Heart attack Father    COPD Father    Obesity Maternal Grandmother    Allergies  Allergen Reactions   Penicillins Rash   Sulfa Antibiotics Rash   Current Outpatient Medications on File Prior to Visit  Medication Sig Dispense Refill   acetaminophen  (TYLENOL ) 650 MG CR tablet Take 1,300 mg by mouth every 8 (eight) hours as needed for pain.     albuterol  (VENTOLIN  HFA) 108 (90 Base) MCG/ACT inhaler Inhale 2 puffs into the lungs every 6 (six) hours as needed for wheezing or shortness of breath. 8 g 0   b complex vitamins capsule Take 1 capsule by mouth daily.     budesonide -formoterol  (SYMBICORT ) 160-4.5 MCG/ACT inhaler Inhale 2 puffs into the lungs 2 (two) times daily. 1 each 12   Cholecalciferol (VITAMIN D3) 2000 units TABS Take 1 tablet by mouth daily.     FLUoxetine  (PROZAC ) 20 MG capsule TAKE 1 CAPSULE(20 MG) BY MOUTH DAILY 90 capsule 1   NON FORMULARY Support hose to the waist for dx of venous  insufficiency and edema 15-20 mm Hg     omeprazole  (PRILOSEC) 20 MG capsule Take 20 mg by mouth daily.     No current facility-administered medications on file prior to visit.    Review of Systems  Constitutional:  Positive for fatigue. Negative for activity change, appetite change, fever and unexpected weight  change.  HENT:  Negative for congestion, ear pain, rhinorrhea, sinus pressure and sore throat.   Eyes:  Negative for pain, redness and visual disturbance.  Respiratory:  Negative for cough, shortness of breath and wheezing.   Cardiovascular:  Negative for chest pain and palpitations.  Gastrointestinal:  Negative for abdominal pain, blood in stool, constipation and diarrhea.  Endocrine: Negative for polydipsia and polyuria.  Genitourinary:  Negative for dysuria, frequency and urgency.  Musculoskeletal:  Positive for arthralgias and neck pain. Negative for back pain, joint swelling and myalgias.  Skin:  Negative for pallor and rash.  Allergic/Immunologic: Negative for environmental allergies.  Neurological:  Negative for dizziness, syncope and headaches.  Hematological:  Negative for adenopathy. Does not bruise/bleed easily.  Psychiatric/Behavioral:  Negative for decreased concentration and dysphoric mood. The patient is not nervous/anxious.        Objective:   Physical Exam Constitutional:      General: She is not in acute distress.    Appearance: Normal appearance. She is well-developed. She is obese. She is not ill-appearing or diaphoretic.  HENT:     Head: Normocephalic and atraumatic.     Right Ear: Tympanic membrane and ear canal normal.     Left Ear: Tympanic membrane and ear canal normal.     Nose: Nose normal.     Mouth/Throat:     Mouth: Mucous membranes are moist.  Eyes:     General: No scleral icterus.       Right eye: No discharge.        Left eye: No discharge.     Extraocular Movements: Extraocular movements intact.     Conjunctiva/sclera: Conjunctivae  normal.     Pupils: Pupils are equal, round, and reactive to light.  Neck:     Thyroid: No thyromegaly.     Vascular: No carotid bruit or JVD.     Comments: Left>right cervical muscle tenderness Normal rom  No spinal bony tenderness No crepitus  Cardiovascular:     Rate and Rhythm: Normal rate and regular rhythm.     Heart sounds: Normal heart sounds.     No gallop.  Pulmonary:     Effort: Pulmonary effort is normal. No respiratory distress.     Breath sounds: Normal breath sounds. No stridor. No wheezing, rhonchi or rales.  Chest:     Chest wall: No tenderness.  Abdominal:     General: There is no distension or abdominal bruit.     Palpations: Abdomen is soft. There is no mass.     Tenderness: There is no abdominal tenderness. There is no right CVA tenderness, left CVA tenderness or guarding.  Musculoskeletal:     Cervical back: Normal range of motion and neck supple. Tenderness present.     Right lower leg: No edema.     Left lower leg: No edema.     Comments: No acute joint changes   Lymphadenopathy:     Cervical: No cervical adenopathy.  Skin:    General: Skin is warm and dry.     Coloration: Skin is not pale.     Findings: No rash.  Neurological:     Mental Status: She is alert.     Cranial Nerves: No cranial nerve deficit.     Motor: No weakness.     Coordination: Coordination normal.     Deep Tendon Reflexes: Reflexes are normal and symmetric. Reflexes normal.  Psychiatric:        Mood and Affect: Mood normal.  Assessment & Plan:   Problem List Items Addressed This Visit       Other   Vitamin D  deficiency   D level today  Is taking D Fatigue       Relevant Orders   VITAMIN D  25 Hydroxy (Vit-D Deficiency, Fractures)   Vitamin B12 deficiency   B12 level today Fatigue  Taking 500 mcg daily       Relevant Orders   Vitamin B12   Neck pain   Vague -suspect muscular  Not positional Some bilateral (worse on left) cervical muscle  tenderness  No bony tenderness Advised ice/heat  Supportive pillow  ? Consider PT if not improved  If worse- imaging       Menopausal symptoms   FSH , LH today  Fatigue  Aches and pains   Not candidate for HRT due to history of blood clot       Relevant Orders   Follicle Stimulating Hormone   Luteinizing hormone   Fatigue - Primary   May be multi factorial  Age 46 with partial hysterectomy-discussed possible of perimenipause or menopause esp in light of joint aches and pains  Had uri last month  Osa is well controlled with cpap  Mood is stable with fluoxetine    History of low D and B12   Lab today incl vit and iron levels  Incl FSH and LH  Is not candidate for HRT due to history of blood clots        Relevant Orders   T4, free   Iron   CBC with Differential/Platelet   Vitamin B12   TSH   Comprehensive metabolic panel with GFR   Current use of proton pump inhibitor   D and B12 levels today in setting of fatigue

## 2023-09-22 NOTE — Assessment & Plan Note (Signed)
 May be multi factorial  Age 48 with partial hysterectomy-discussed possible of perimenipause or menopause esp in light of joint aches and pains  Had uri last month  Osa is well controlled with cpap  Mood is stable with fluoxetine    History of low D and B12   Lab today incl vit and iron levels  Incl FSH and LH  Is not candidate for HRT due to history of blood clots

## 2023-09-23 ENCOUNTER — Ambulatory Visit: Payer: Self-pay | Admitting: Family Medicine

## 2023-09-23 DIAGNOSIS — R7989 Other specified abnormal findings of blood chemistry: Secondary | ICD-10-CM | POA: Insufficient documentation

## 2023-09-23 LAB — COMPREHENSIVE METABOLIC PANEL WITH GFR
ALT: 16 IU/L (ref 0–32)
AST: 19 IU/L (ref 0–40)
Albumin: 4.3 g/dL (ref 3.9–4.9)
Alkaline Phosphatase: 92 IU/L (ref 44–121)
BUN/Creatinine Ratio: 18 (ref 9–23)
BUN: 12 mg/dL (ref 6–24)
Bilirubin Total: 0.3 mg/dL (ref 0.0–1.2)
CO2: 24 mmol/L (ref 20–29)
Calcium: 9.3 mg/dL (ref 8.7–10.2)
Chloride: 105 mmol/L (ref 96–106)
Creatinine, Ser: 0.67 mg/dL (ref 0.57–1.00)
Globulin, Total: 2.2 g/dL (ref 1.5–4.5)
Glucose: 90 mg/dL (ref 70–99)
Potassium: 4.1 mmol/L (ref 3.5–5.2)
Sodium: 143 mmol/L (ref 134–144)
Total Protein: 6.5 g/dL (ref 6.0–8.5)
eGFR: 106 mL/min/{1.73_m2} (ref >59)

## 2023-09-23 LAB — CBC WITH DIFFERENTIAL/PLATELET
Basophils Absolute: 0.1 10*3/uL (ref 0.0–0.2)
Basos: 1 %
EOS (ABSOLUTE): 0.3 10*3/uL (ref 0.0–0.4)
Eos: 3 %
Hematocrit: 41.6 % (ref 34.0–46.6)
Hemoglobin: 13.5 g/dL (ref 11.1–15.9)
Immature Grans (Abs): 0 10*3/uL (ref 0.0–0.1)
Immature Granulocytes: 0 %
Lymphocytes Absolute: 3.9 10*3/uL — ABNORMAL HIGH (ref 0.7–3.1)
Lymphs: 39 %
MCH: 28.9 pg (ref 26.6–33.0)
MCHC: 32.5 g/dL (ref 31.5–35.7)
MCV: 89 fL (ref 79–97)
Monocytes Absolute: 0.7 10*3/uL (ref 0.1–0.9)
Monocytes: 7 %
Neutrophils Absolute: 5 10*3/uL (ref 1.4–7.0)
Neutrophils: 50 %
Platelets: 280 10*3/uL (ref 150–450)
RBC: 4.67 x10E6/uL (ref 3.77–5.28)
RDW: 13 % (ref 11.7–15.4)
WBC: 10.1 10*3/uL (ref 3.4–10.8)

## 2023-09-23 LAB — T4, FREE: Free T4: 0.81 ng/dL — ABNORMAL LOW (ref 0.82–1.77)

## 2023-09-23 LAB — IRON: Iron: 55 ug/dL (ref 27–159)

## 2023-09-23 LAB — TSH: TSH: 2.11 u[IU]/mL (ref 0.450–4.500)

## 2023-09-23 LAB — VITAMIN D 25 HYDROXY (VIT D DEFICIENCY, FRACTURES): Vit D, 25-Hydroxy: 34.3 ng/mL (ref 30.0–100.0)

## 2023-09-23 LAB — FOLLICLE STIMULATING HORMONE: FSH: 35.5 m[IU]/mL

## 2023-09-23 LAB — LUTEINIZING HORMONE: LH: 28.6 m[IU]/mL

## 2023-09-23 LAB — VITAMIN B12: Vitamin B-12: 265 pg/mL (ref 232–1245)

## 2023-09-24 ENCOUNTER — Telehealth: Payer: Self-pay | Admitting: *Deleted

## 2023-09-24 NOTE — Telephone Encounter (Signed)
 called  pt and schedule a appt for thyroid recheck

## 2023-09-24 NOTE — Telephone Encounter (Signed)
 Based off of last labs PCP wants to schedule a non fasting lab appt in one month to recheck some thyroid labs. Please schedule non fasting lab appt in one month (pt aware f/u lab appt needed)

## 2023-10-02 LAB — ALLERGEN PROFILE, MOLD
Aureobasidi Pullulans IgE: <0.1 kU/L
Candida Albicans IgE: <0.1 kU/L
M009-IgE Fusarium proliferatum: <0.1 kU/L
M014-IgE Epicoccum purpur: <0.1 kU/L
Mucor Racemosus IgE: <0.1 kU/L
Phoma Betae IgE: <0.1 kU/L
Setomelanomma Rostrat: <0.1 kU/L
Stemphylium Herbarum IgE: <0.1 kU/L

## 2023-10-02 LAB — ALLERGENS W/TOTAL IGE AREA 1
Alternaria Alternata IgE: <0.1 kU/L
Amer Sycamore IgE Qn: <0.1 kU/L
Aspergillus Fumigatus IgE: <0.1 kU/L
Bermuda Grass IgE: <0.1 kU/L
Cat Dander IgE: <0.1 kU/L
Cedar, Mountain IgE: <0.1 kU/L
Cladosporium Herbarum IgE: <0.1 kU/L
Cockroach, German IgE: <0.1 kU/L
Common Silver Birch IgE: <0.1 kU/L
Cottonwood IgE: <0.1 kU/L
D Farinae IgE: <0.1 kU/L
D Pteronyssinus IgE: <0.1 kU/L
Dog Dander IgE: <0.1 kU/L
Elm, American IgE: <0.1 kU/L
IgE (Immunoglobulin E), Serum: 14 [IU]/mL (ref 6–495)
Maple/Box Elder IgE: <0.1 kU/L
Mouse Urine IgE: <0.1 kU/L
Mugwort IgE Qn: <0.1 kU/L
Oak, White IgE: <0.1 kU/L
Penicillium Chrysogen IgE: <0.1 kU/L
Pigweed, Rough IgE: <0.1 kU/L
Ragweed, Short IgE: <0.1 kU/L
Sheep Sorrel IgE Qn: <0.1 kU/L
T010-IgE Walnut: <0.1 kU/L
T015-IgE Ash, White: <0.1 kU/L
Timothy Grass IgE: <0.1 kU/L
White Mulberry IgE: <0.1 kU/L

## 2023-10-02 LAB — SPECIMEN STATUS REPORT

## 2023-10-12 ENCOUNTER — Encounter: Payer: Self-pay | Admitting: Internal Medicine

## 2023-10-12 ENCOUNTER — Ambulatory Visit: Admitting: Internal Medicine

## 2023-10-12 VITALS — BP 128/80 | HR 72 | Temp 98.2°F | Ht 64.0 in | Wt 231.4 lb

## 2023-10-12 DIAGNOSIS — J4540 Moderate persistent asthma, uncomplicated: Secondary | ICD-10-CM

## 2023-10-12 DIAGNOSIS — G4733 Obstructive sleep apnea (adult) (pediatric): Secondary | ICD-10-CM | POA: Diagnosis not present

## 2023-10-12 LAB — NITRIC OXIDE: Nitric Oxide: 36

## 2023-10-12 NOTE — Patient Instructions (Signed)
 Continue Symbicort  2 puffs at night Use albuterol  as needed  Excellent Job A+ GOLD STAR!!  Continue CPAP as prescribed  Patient Instructions Continue to use CPAP every night, minimum of 4-6 hours a night.  Change equipment every 30 days or as directed by DME.  Wash your tubing with warm soap and water daily, hang to dry. Wash humidifier portion weekly. Use bottled, distilled water and change daily   Be aware of reduced alertness and do not drive or operate heavy machinery if experiencing this or drowsiness.  Exercise encouraged, as tolerated. Encouraged proper weight management.  Important to get eight or more hours of sleep  Limiting the use of the computer and television before bedtime.  Decrease naps during the day, so night time sleep will become enhanced.  Limit caffeine, and sleep deprivation.    Avoid Allergens and Irritants Avoid secondhand smoke Avoid SICK contacts Recommend  Masking  when appropriate Recommend Keep up-to-date with vaccinations

## 2023-10-12 NOTE — Progress Notes (Unsigned)
 @Patient  ID: Kari Rice, female    DOB: 09/01/1975, 48 y.o.   MRN: [id number]   TEST/EVENTS :  Home sleep study December 30, 2015 AHI 12.5    SYNOPSIS 48 year old female followed for obstructive sleep apnea .  reestablish for sleep apnea.  Patient presents for a sleep consult today to reestablish for sleep apnea.   Patient was previously diagnosed with sleep apnea in 2017 with a home sleep study that showed mild sleep apnea with AHI of 12.5's/hour.   Patient was started on CPAP.  She was last seen in the office in May 2019.  Caffeine intake is usually 2 cups of coffee daily.  No symptoms suspicious for cataplexy or sleep paralysis.   She uses a nasal mask.  Gets her supplies and CPAP from [employer].  She does not use any sleep aids. Medical history significant for Bell's palsy, DVT, GERD, Depression  Surgical history significant for cholecystectomy, nasal septoplasty, hysterectomy Social history patient lives with her mother.  She works in Clinical biochemist at [employer].  She has no children.  She is divorced.  She is a never smoker.  No alcohol or drug use. Family history positive for heart disease, rheumatoid arthritis and cancer.     CC Follow up OSA Signs and symptoms of his asthma  HPI Several months ago Patient with previous acute viral bronchitis  Patient was prescribed antibiotics and prednisone  Patient using albuterol  as needed Diagnosed with COVID in November 2023 Diagnosed with COVID February 2024 Symptoms had resolved  Follow up assessment of Cough and chest congestion Persistent wheezing persistent cough persistent shortness of breath Patient started on Symbicort  inhaler last office visit Symptoms have significantly improved on Symbicort  Patient uses Symbicort  2 puffs at night only This has controlled her symptoms I have relayed to her her allergy  testing IgE levels are within normal limits Absolute if eosinophil count was 300    Assessment of ASTHMA FeNO  36   ppb-Elevated exhaled Nitric oxide  testing is highly consistent with type II inflammation But will Continue inhalers as prescribed   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  Encouraged proper weight management.  Discussed driving precautions and its relationship with hypersomnolence.  Discussed sleep hygiene, and benefits of a fixed sleep waked time.  The importance of getting eight or more hours of sleep discussed with patient.  Discussed limiting the use of the computer and television before bedtime.  Decrease naps during the day, so night time sleep will become enhanced.  Limit caffeine, and sleep deprivation.   Patient uses and benefits from therapy Using CPAP nightly and with naps Pressure setting is comfortable and is sleeping well.  Follow-up assessment for OSA CPAP download reviewed in detail with patient Excellent compliance report at 100% usage AHI reduced to 0.6 Auto CPAP 5-15  Current weight 231 pounds at 5 feet 4 inches Recommend weight loss   Allergies  Allergen Reactions   Penicillins Rash   Sulfa Antibiotics Rash    Immunization History  Administered Date(s) Administered   Influenza, Seasonal, Injecte, Preservative Fre 07/24/2023   Influenza,inj,Quad PF,6+ Mos 04/30/2014, 01/14/2016, 01/11/2018, 12/05/2018, 12/26/2019, 04/18/2021   Influenza-Unspecified 02/12/2023   PFIZER(Purple Top)SARS-COV-2 Vaccination 06/12/2019, 07/03/2019, 04/01/2020   Pfizer(Comirnaty)Fall Seasonal Vaccine 12 years and older 07/24/2023   Td 04/02/2008, 10/07/2022   Tdap 08/10/2017   Zoster Recombinant(Shingrix) 07/24/2023    Past Medical History:  Diagnosis Date   Anemia  08/21   Heavy period due to fibroids   Arthritis    right hand   Depression 01/2020   Attended online therapy   DVT (deep venous thrombosis) (HCC) 2013   GERD (gastroesophageal reflux disease)    Oxygen deficiency     PONV (postoperative nausea and vomiting)    after cholecystectomy   Sleep apnea    CPAP   Varicose veins     Tobacco History: Social History   Tobacco Use  Smoking Status Never   Passive exposure: Past  Smokeless Tobacco Never   Counseling given: Not Answered   Outpatient Medications Prior to Visit  Medication Sig Dispense Refill   acetaminophen  (TYLENOL ) 650 MG CR tablet Take 1,300 mg by mouth every 8 (eight) hours as needed for pain.     albuterol  (VENTOLIN  HFA) 108 (90 Base) MCG/ACT inhaler Inhale 2 puffs into the lungs every 6 (six) hours as needed for wheezing or shortness of breath. 8 g 0   b complex vitamins capsule Take 1 capsule by mouth daily.     budesonide -formoterol  (SYMBICORT ) 160-4.5 MCG/ACT inhaler Inhale 2 puffs into the lungs 2 (two) times daily. 1 each 12   Cholecalciferol (VITAMIN D3) 2000 units TABS Take 1 tablet by mouth daily.     FLUoxetine  (PROZAC ) 20 MG capsule TAKE 1 CAPSULE(20 MG) BY MOUTH DAILY 90 capsule 1   NON FORMULARY Support hose to the waist for dx of venous insufficiency and edema 15-20 mm Hg     omeprazole  (PRILOSEC) 20 MG capsule Take 20 mg by mouth daily.     No facility-administered medications prior to visit.   BP 128/80 (BP Location: Left Arm, Patient Position: Sitting, Cuff Size: Large)   Pulse 72   Temp 98.2 F (36.8 C) (Oral)   Ht 5' 4 (1.626 m)   Wt 231 lb 6.4 oz (105 kg)   LMP  (LMP Unknown)   SpO2 97%   BMI 39.72 kg/m       Review of Systems: Gen:  Denies  fever, sweats, chills weight loss  HEENT: Denies blurred vision, double vision, ear pain, eye pain, hearing loss, nose bleeds, sore throat Cardiac:  No dizziness, chest pain or heaviness, chest tightness,edema, No JVD Resp:   No cough, -sputum production, -shortness of breath,-wheezing, -hemoptysis,  Other:  All other systems negative   Physical Examination:   General Appearance: No distress  EYES PERRLA, EOM intact.   NECK Supple, No JVD Pulmonary: normal  breath sounds, No wheezing.  CardiovascularNormal S1,S2.  No m/r/g.   Abdomen: Benign, Soft, non-tender. Neurology UE/LE 5/5 strength, no focal deficits Ext pulses intact, cap refill intact ALL OTHER ROS ARE NEGATIVE  CBC    Component Value Date/Time   WBC 10.1 09/22/2023 1535   WBC 7.7 12/24/2022 1254   RBC 4.67 09/22/2023 1535   RBC 4.94 12/24/2022 1254   HGB 13.5 09/22/2023 1535   HCT 41.6 09/22/2023 1535   PLT 280 09/22/2023 1535   MCV 89 09/22/2023 1535   MCH 28.9 09/22/2023 1535   MCH 29.1 12/24/2022 1254   MCHC 32.5 09/22/2023 1535   MCHC 33.4 12/24/2022 1254   RDW 13.0 09/22/2023 1535   LYMPHSABS 3.9 (H) 09/22/2023 1535   MONOABS 0.6 08/23/2020 1622   EOSABS 0.3 09/22/2023 1535   BASOSABS 0.1 09/22/2023 1535   IgE levels 18     ASSESSMENT AND PLAN 48 year old pleasant white female seen today for follow-up assessment for sleep apnea with previous AHI of 12.5 with a  previous diagnosis of COVID-19 infection several times over the last several years with a history of reactive airways disease ongoing cough and bronchospasms with uncontrolled GERD  Findings are consistent with mild to moderate persistent asthma Well-controlled at this time  Assessment of OSA Previous AHI 12.5 Continue CPAP as prescribed  Excellent compliance report Reviewed compliance report in detail with patient Patient definitely benefits the use of CPAP therapy as prescribed Using CPAP nightly and with naps Pressure setting is comfortable and is sleeping well. CPAP prescription auto 5-15 AHI reduced to 0.9  No evidence of acute heart failure at this time No respiratory distress No fevers, chills, nausea, vomiting, diarrhea No evidence hemoptysis  Patient Instructions Continue to use CPAP every night, minimum of 4-6 hours a night.  Change equipment every 30 days or as directed by DME.  Wash your tubing with warm soap and water daily, hang to dry. Wash humidifier portion weekly. Use  bottled, distilled water and change daily   Be aware of reduced alertness and do not drive or operate heavy machinery if experiencing this or drowsiness.  Exercise encouraged, as tolerated. Encouraged proper weight management.  Important to get eight or more hours of sleep  Limiting the use of the computer and television before bedtime.  Decrease naps during the day, so night time sleep will become enhanced.  Limit caffeine, and sleep deprivation.  HTN, stroke, uncontrolled diabetes and heart failure are potential risk factors.  Risk of untreated sleep apnea including cardiac arrhthymias, stroke, DM, pulm HTN.   Findings c/w ASTHMA moderate persistent post COVID infection Continue Symbicort  as prescribed Allergy  panel is negative IgE levels are normal Avoid Allergens and Irritants Avoid secondhand smoke Avoid SICK contacts Recommend  Masking  when appropriate Recommend Keep up-to-date with vaccinations  Obesity -recommend significant weight loss -recommend changing diet  Deconditioned state -Recommend increased daily activity and exercise     MEDICATION ADJUSTMENTS/LABS AND TESTS ORDERED: Symbicort  2 puffs at night Rinse mouth after use Continue CPAP as prescribed Can use albuterol  as needed  Avoid Allergens and Irritants Avoid secondhand smoke Avoid SICK contacts Recommend  Masking  when appropriate Recommend Keep up-to-date with vaccinations   CURRENT MEDICATIONS REVIEWED AT LENGTH WITH PATIENT TODAY   Patient  satisfied with Plan of action and management. All questions answered   Follow up 6 months   I spent a total of 42 minutes dedicated to the care of this patient on the date of this encounter to include pre-visit review of records, face-to-face time with the patient discussing conditions above, post visit ordering of testing, clinical documentation with the electronic health record, making appropriate referrals as documented, and communicating necessary  information to the patient's healthcare team.    The Patient requires high complexity decision making for assessment and support, frequent evaluation and titration of therapies, application of advanced monitoring technologies and extensive interpretation of multiple databases.  Patient satisfied with Plan of action and management. All questions answered    Nickolas Alm Cellar, M.D.  [facility]  Medical Director [facility] Medical Director [facility] Cardio-Pulmonary Department

## 2023-10-25 ENCOUNTER — Other Ambulatory Visit (INDEPENDENT_AMBULATORY_CARE_PROVIDER_SITE_OTHER)

## 2023-10-25 DIAGNOSIS — R7989 Other specified abnormal findings of blood chemistry: Secondary | ICD-10-CM | POA: Diagnosis not present

## 2023-10-25 NOTE — Addendum Note (Signed)
 Addended by: ISADORA RAISIN on: 10/25/2023 08:17 AM   Modules accepted: Orders

## 2023-10-28 ENCOUNTER — Ambulatory Visit: Payer: Self-pay | Admitting: Family Medicine

## 2023-10-28 LAB — T4, FREE: Free T4: 0.82 ng/dL (ref 0.82–1.77)

## 2023-10-28 LAB — TSH: TSH: 2.26 u[IU]/mL (ref 0.450–4.500)

## 2023-10-28 LAB — T3, FREE: T3, Free: 2.5 pg/mL (ref 2.0–4.4)

## 2023-11-23 ENCOUNTER — Other Ambulatory Visit: Payer: Self-pay | Admitting: Family Medicine

## 2024-01-17 ENCOUNTER — Ambulatory Visit
Admission: EM | Admit: 2024-01-17 | Discharge: 2024-01-17 | Disposition: A | Discharge status: Home / Self Care | Attending: Emergency Medicine | Admitting: Emergency Medicine

## 2024-01-17 ENCOUNTER — Encounter: Payer: Self-pay | Admitting: Emergency Medicine

## 2024-01-17 DIAGNOSIS — S91331A Puncture wound without foreign body, right foot, initial encounter: Secondary | ICD-10-CM | POA: Diagnosis not present

## 2024-01-17 MED ORDER — DOXYCYCLINE HYCLATE 100 MG PO CAPS: 100.0000 mg | ORAL_CAPSULE | Freq: Two times a day (BID) | ORAL | 0 refills | Status: DC

## 2024-01-17 NOTE — Discharge Instructions (Signed)
 Today you are evaluated for the puncture wound in her foot, on exam able to cause pus to come out which is consistent with infection  Take doxycycline  twice daily for 7 days for treatment of bacteria  Last tetanus was completed in 2024 therefore you will not need booster today  At home clean over the wound at least once daily with unscented soap and water, easiest to do during her normal hygiene  May cover with a nonstick bandage per preference, may apply topical antibiotic ointment per preference  May take Tylenol  and or Motrin  to help reduce pain  May continue activity as tolerated  If symptoms continue to persist or worsen please follow-up for reevaluation

## 2024-01-17 NOTE — ED Triage Notes (Signed)
 Patient reports that she stepped on a nail 2 days. Patient not complains of wound to the bottom of right foot and pain.Patient has not used anything on wound. Patient states her tetanus is update. Rates pain 5/10.

## 2024-01-17 NOTE — ED Provider Notes (Signed)
 [name]    CSN: [id number] Arrival date & time: 01/17/24  1006      History   Chief Complaint Chief Complaint  Patient presents with   Puncture Wound    HPI Kari Rice is a 48 y.o. female.   Patient presents for evaluation of worsening pain to the right foot after stepping on a nail 3 days ago.  Symptoms exacerbated today while walking and standing at work, described as a aching and throbbing.  Has not cleaned the wound.  Has not attempted treatment.  Last tetanus 2024.  Past Medical History:  Diagnosis Date   Anemia 08/21   Heavy period due to fibroids   Arthritis    right hand   Depression 01/2020   Attended online therapy   DVT (deep venous thrombosis) (HCC) 2013   GERD (gastroesophageal reflux disease)    Oxygen deficiency    PONV (postoperative nausea and vomiting)    after cholecystectomy   Sleep apnea    CPAP   Varicose veins     Patient Active Problem List   Diagnosis Date Noted   Abnormal thyroid blood test 09/23/2023   Menopausal symptoms 09/22/2023   Fatigue 09/22/2023   Current use of proton pump inhibitor 05/26/2023   Vitamin B12 deficiency 05/26/2023   Obesity (BMI 30-39.9) 12/16/2021   OSA (obstructive sleep apnea) 12/03/2021   Sigmoid diverticulosis    Colon cancer screening 04/18/2021   Dysthymia 12/26/2019   Right leg swelling 12/11/2019   Fungal nail infection 10/11/2019   Patellofemoral stress syndrome 07/20/2018   Injury of left toe 01/25/2018   GERD (gastroesophageal reflux disease) 11/22/2017   H/O vaginitis 08/10/2017   Vitamin D  deficiency 01/15/2016   Routine general medical examination at a health care facility 05/29/2014   Encounter for screening mammogram for breast cancer 05/29/2014   Joint pain 04/10/2014   History of DVT (deep vein thrombosis) 07/06/2012   Varicose veins of bilateral lower extremities with other complications 07/06/2012   Venous (peripheral) insufficiency 12/23/2011   Phlebitis and  thrombophlebitis of the leg 11/06/2011   Venous insufficiency, peripheral 10/26/2011   Pedal edema 10/26/2011   Neck pain 10/21/2009    Past Surgical History:  Procedure Laterality Date   ABDOMINAL HYSTERECTOMY     CHOLECYSTECTOMY  Feb. 2006   Gall Bladder   COLONOSCOPY WITH PROPOFOL  N/A 05/23/2021   Procedure: COLONOSCOPY WITH PROPOFOL ;  Surgeon: Unk Corinn Skiff, MD;  Location: [facility] ENDOSCOPY;  Service: Gastroenterology;  Laterality: N/A;   HAND SURGERY  2016   NASAL SEPTOPLASTY W/ TURBINOPLASTY Bilateral 10/01/2017   Procedure: NASAL SEPTOPLASTY WITH SUBMUCOCELE RESECTION OF TURBINATE;  Surgeon: Herminio Miu, MD;  Location[facility];  Service: ENT;  Laterality: Bilateral;  Sleep apnea   ROBOTIC ASSISTED LAPAROSCOPIC HYSTERECTOMY AND SALPINGECTOMY Bilateral 03/13/2020   Procedure: XI ROBOTIC ASSISTED LAPAROSCOPIC HYSTERECTOMY AND SALPINGECTOMY;  Surgeon: Victor Claudell SAUNDERS, MD;  Location: [facility] ORS;  Service: Gynecology;  Laterality: Bilateral;   THROMBECTOMY Right 01/13/12 and  01/21/12   Right iliac vein stent and Vein thrombosis    OB History     Gravida  0   Para  0   Term  0   Preterm  0   AB  0   Living  0      SAB  0   IAB  0   Ectopic  0   Multiple  0   Live Births  0  Home Medications    Prior to Admission medications   Medication Sig Start Date End Date Taking? Authorizing Provider  doxycycline  (VIBRAMYCIN ) 100 MG capsule Take 1 capsule (100 mg total) by mouth 2 (two) times daily. 01/17/24  Yes Sharell Hilmer R, NP  acetaminophen  (TYLENOL ) 650 MG CR tablet Take 1,300 mg by mouth every 8 (eight) hours as needed for pain.    [provider]  albuterol  (VENTOLIN  HFA) 108 (90 Base) MCG/ACT inhaler Inhale 2 puffs into the lungs every 6 (six) hours as needed for wheezing or shortness of breath. 05/26/23   Tower, Laine LABOR, MD  b complex vitamins capsule Take 1 capsule by mouth daily.    [provider]   budesonide -formoterol  (SYMBICORT ) 160-4.5 MCG/ACT inhaler Inhale 2 puffs into the lungs 2 (two) times daily. 09/08/23   Kasa, Kurian, MD  Cholecalciferol (VITAMIN D3) 2000 units TABS Take 1 tablet by mouth daily.    [provider]  FLUoxetine  (PROZAC ) 20 MG capsule TAKE 1 CAPSULE(20 MG) BY MOUTH DAILY 11/23/23   Tower, Laine LABOR, MD  NON FORMULARY Support hose to the waist for dx of venous insufficiency and edema 15-20 mm Hg    [provider]  omeprazole  (PRILOSEC) 20 MG capsule Take 20 mg by mouth daily.    [provider]    Family History Family History  Problem Relation Age of Onset   Arthritis Mother    Cancer Mother        uterine    Heart disease Father        Heart Disease before age 14   Cancer Father        skin    Hypertension Father    Heart attack Father    COPD Father    Obesity Maternal Grandmother     Social History Social History   Tobacco Use   Smoking status: Never    Passive exposure: Past   Smokeless tobacco: Never  Vaping Use   Vaping status: Never Used  Substance Use Topics   Alcohol use: No   Drug use: Never     Allergies   Penicillins and Sulfa antibiotics   Review of Systems Review of Systems   Physical Exam Triage Vital Signs ED Triage Vitals  Encounter Vitals Group     BP 01/17/24 1016 115/82     Girls Systolic BP Percentile --      Girls Diastolic BP Percentile --      Boys Systolic BP Percentile --      Boys Diastolic BP Percentile --      Pulse Rate 01/17/24 1016 73     Resp 01/17/24 1016 18     Temp 01/17/24 1016 98.2 F (36.8 C)     Temp Source 01/17/24 1016 Oral     SpO2 01/17/24 1016 97 %     Weight --      Height --      Head Circumference --      Peak Flow --      Pain Score 01/17/24 1021 5     Pain Loc --      Pain Education --      Exclude from Growth Chart --    No data found.  Updated Vital Signs BP 115/82 (BP Location: Left Arm)   Pulse 73   Temp 98.2 F (36.8 C) (Oral)    Resp 18   LMP  (LMP Unknown)   SpO2 97%   Visual Acuity Right Eye Distance:  Left Eye Distance:   Bilateral Distance:    Right Eye Near:   Left Eye Near:    Bilateral Near:     Physical Exam Constitutional:      Appearance: Normal appearance.  Eyes:     Extraocular Movements: Extraocular movements intact.  Pulmonary:     Effort: Pulmonary effort is normal.  Musculoskeletal:       Feet:  Feet:     Comments: To less than 0.5 cm puncture wounds present to the plantar aspect of the right midfoot, lower puncture able to expel Russie Gulledge purulent drainage, tender to palpation, no swelling or erythema noted Neurological:     Mental Status: She is alert and oriented to person, place, and time. Mental status is at baseline.      UC Treatments / Results  Labs (all labs ordered are listed, but only abnormal results are displayed) Labs Reviewed - No data to display  EKG   Radiology No results found.  Procedures Procedures (including critical care time)  Medications Ordered in UC Medications - No data to display  Initial Impression / Assessment and Plan / UC Course  I have reviewed the triage vital signs and the nursing notes.  Pertinent labs & imaging results that were available during my care of the patient were reviewed by me and considered in my medical decision making (see chart for details).  Puncture wound of right foot, initial encounter  2 wounds noted on exam with purulent drainage consistent with infection, discussed with patient, sites cleansed with antiseptic and covered with a nonadherent dressing, recommended daily cleansing at home, prescribed doxycycline , tetanus up-to-date therefore deferred, advised over-the-counter analgesics for pain management, recommended follow-up for any persisting or worsening symptoms Final Clinical Impressions(s) / UC Diagnoses   Final diagnoses:  Puncture wound of right foot, initial encounter     Discharge Instructions       Today you are evaluated for the puncture wound in her foot, on exam able to cause pus to come out which is consistent with infection  Take doxycycline  twice daily for 7 days for treatment of bacteria  Last tetanus was completed in 2024 therefore you will not need booster today  At home clean over the wound at least once daily with unscented soap and water, easiest to do during her normal hygiene  May cover with a nonstick bandage per preference, may apply topical antibiotic ointment per preference  May take Tylenol  and or Motrin  to help reduce pain  May continue activity as tolerated  If symptoms continue to persist or worsen please follow-up for reevaluation   ED Prescriptions     Medication Sig Dispense Auth. Provider   doxycycline  (VIBRAMYCIN ) 100 MG capsule Take 1 capsule (100 mg total) by mouth 2 (two) times daily. 14 capsule Jeydi Klingel, Shelba SAUNDERS, NP      PDMP not reviewed this encounter.   Teresa Shelba SAUNDERS, NP 01/17/24 1042

## 2024-01-24 ENCOUNTER — Ambulatory Visit
Admission: RE | Admit: 2024-01-24 | Discharge: 2024-01-24 | Disposition: A | Source: Ambulatory Visit | Attending: Family Medicine | Admitting: Family Medicine

## 2024-01-24 ENCOUNTER — Ambulatory Visit: Admitting: Family Medicine

## 2024-01-24 ENCOUNTER — Ambulatory Visit: Payer: Self-pay | Admitting: Family Medicine

## 2024-01-24 VITALS — BP 124/86 | HR 63 | Temp 98.4°F | Ht 64.0 in | Wt 225.1 lb

## 2024-01-24 DIAGNOSIS — S91331A Puncture wound without foreign body, right foot, initial encounter: Secondary | ICD-10-CM

## 2024-01-24 DIAGNOSIS — S91331S Puncture wound without foreign body, right foot, sequela: Secondary | ICD-10-CM

## 2024-01-24 DIAGNOSIS — N898 Other specified noninflammatory disorders of vagina: Secondary | ICD-10-CM | POA: Diagnosis not present

## 2024-01-24 DIAGNOSIS — Z23 Encounter for immunization: Secondary | ICD-10-CM

## 2024-01-24 MED ORDER — ESTRADIOL 0.01 % VA CREA: TOPICAL_CREAM | VAGINAL | 1 refills | Status: AC

## 2024-01-24 NOTE — Assessment & Plan Note (Signed)
 Reviewed UC notes/eval  Stepped on nail several times / left foot  Some improving discomfort and tenderness Reassuring exam  Xray -no fb, no gas or bony change  Tetanus utd  Finishing course of doxycycline    Encouraged to keep wound areas clean /cover if needed Watch for redness/swelling or increase pain or any drainage   Note for work-light duty another week  Monitor closely Call back and Er precautions noted in detail today

## 2024-01-24 NOTE — Progress Notes (Signed)
 Subjective:    Patient ID: Kari Rice, female    DOB: Jan 16, 1976, 48 y.o.   MRN: [id number]  HPI  Wt Readings from Last 3 Encounters:  01/24/24 225 lb 2 oz (102.1 kg)  10/12/23 231 lb 6.4 oz (105 kg)  09/22/23 233 lb (105.7 kg)   Weight is 225 lb 2 oz     Vitals:   01/24/24 1059  BP: 124/86  Pulse: 63  Temp: 98.4 F (36.9 C)  SpO2: 97%    Pt presents for c/o  Puncture wound right foot  Flu shot  Vaginal dryness (had a partial hysterectomy)   Stepped on a nail 10/4 Cleaning up weeds near trash pile outdoors  Stepped backwards onto nail, twice  Punctured shoe and insert  (older shoe)  May have been older nail/rusty   Got infected several days later   Seen in UC on 10/6   Per notes 2 wounds noted on exam with purulent drainage consistent with infection, discussed with patient, sites cleansed with antiseptic and covered with a nonadherent dressing, recommended daily cleansing at home, prescribed doxycycline , tetanus up-to-date therefore deferred, advised over-the-counter analgesics for pain management, recommended follow-up for any persisting or worsening symptoms    Tetanus 2024  Now  Still tender to the touch Sore after work day   No pain if not touching or putting weight on it  Can bear weight  If prolonged standing or walking is sore   Elevated when at home  Cleaning with soap / soft soap  Neosporin   Not red or swollen No more drainage-none on bandage    DG Foot Complete Right Result Date: 01/24/2024 EXAM: 3 OR MORE VIEW(S) XRAY OF THE RIGHT FOOT 01/24/2024 11:35:57 AM COMPARISON: Right foot radiograph 12/05/2018. CLINICAL HISTORY: Puncture wounds on R foot in arch area, some tenderness. FINDINGS: BONES AND JOINTS: Plantar and posterior calcaneal spurs. No acute fracture. No focal osseous lesion. No joint dislocation. SOFT TISSUES: Mild soft tissue edema. No evidence of soft tissue gas. No radiodense foreign body. IMPRESSION: 1. No acute osseous  abnormality. 2. Mild soft tissue edema, no radiodense foreign body or soft tissue gas. Electronically signed by: Donnice Mania MD 01/24/2024 12:08 PM EDT RP Workstation: [id number]    More vaginal dryness lately  Partial hyst in past  Wondered if vaginal estrogen cream would be safe No signs and symptoms of infection   Patient Active Problem List   Diagnosis Date Noted   Puncture wound of foot excluding toes without complication, right, sequela 01/24/2024   Vaginal dryness 01/24/2024   Abnormal thyroid blood test 09/23/2023   Menopausal symptoms 09/22/2023   Fatigue 09/22/2023   Current use of proton pump inhibitor 05/26/2023   Vitamin B12 deficiency 05/26/2023   Obesity (BMI 30-39.9) 12/16/2021   OSA (obstructive sleep apnea) 12/03/2021   Sigmoid diverticulosis    Colon cancer screening 04/18/2021   Dysthymia 12/26/2019   Right leg swelling 12/11/2019   Fungal nail infection 10/11/2019   Patellofemoral stress syndrome 07/20/2018   Injury of left toe 01/25/2018   GERD (gastroesophageal reflux disease) 11/22/2017   H/O vaginitis 08/10/2017   Vitamin D  deficiency 01/15/2016   Routine general medical examination at a health care facility 05/29/2014   Encounter for screening mammogram for breast cancer 05/29/2014   Joint pain 04/10/2014   History of DVT (deep vein thrombosis) 07/06/2012   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 07/06/2012   Venous (peripheral) insufficiency 12/23/2011   Phlebitis and thrombophlebitis  of the leg 11/06/2011   Venous insufficiency, peripheral 10/26/2011   Pedal edema 10/26/2011   Neck pain 10/21/2009   Past Medical History:  Diagnosis Date   Anemia 08/21   Heavy period due to fibroids   Arthritis    right hand   Depression 01/2020   Attended online therapy   DVT (deep venous thrombosis) (HCC) 2013   GERD (gastroesophageal reflux disease)    Oxygen deficiency    PONV (postoperative nausea and vomiting)     after cholecystectomy   Sleep apnea    CPAP   Varicose veins    Past Surgical History:  Procedure Laterality Date   ABDOMINAL HYSTERECTOMY     CHOLECYSTECTOMY  Feb. 2006   Gall Bladder   COLONOSCOPY WITH PROPOFOL  N/A 05/23/2021   Procedure: COLONOSCOPY WITH PROPOFOL ;  Surgeon: Unk Corinn Skiff, MD;  Location: [facility];  Service: Gastroenterology;  Laterality: N/A;   HAND SURGERY  2016   NASAL SEPTOPLASTY W/ TURBINOPLASTY Bilateral 10/01/2017   Procedure: NASAL SEPTOPLASTY WITH SUBMUCOCELE RESECTION OF TURBINATE;  Surgeon: Herminio Miu, MD;  Location: [facility];  Service: ENT;  Laterality: Bilateral;  Sleep apnea   ROBOTIC ASSISTED LAPAROSCOPIC HYSTERECTOMY AND SALPINGECTOMY Bilateral 03/13/2020   Procedure: XI ROBOTIC ASSISTED LAPAROSCOPIC HYSTERECTOMY AND SALPINGECTOMY;  Surgeon: Victor Claudell SAUNDERS, MD;  Location: [facility];  Service: Gynecology;  Laterality: Bilateral;   THROMBECTOMY Right 01/13/12 and  01/21/12   Right iliac vein stent and Vein thrombosis   Social History   Tobacco Use   Smoking status: Never    Passive exposure: Past   Smokeless tobacco: Never  Vaping Use   Vaping status: Never Used  Substance Use Topics   Alcohol use: No   Drug use: Never   Family History  Problem Relation Age of Onset   Arthritis Mother    Cancer Mother        uterine    Heart disease Father        Heart Disease before age 5   Cancer Father        skin    Hypertension Father    Heart attack Father    COPD Father    Obesity Maternal Grandmother    Allergies  Allergen Reactions   Penicillins Rash   Sulfa Antibiotics Rash   Current Outpatient Medications on File Prior to Visit  Medication Sig Dispense Refill   acetaminophen  (TYLENOL ) 650 MG CR tablet Take 1,300 mg by mouth every 8 (eight) hours as needed for pain.     albuterol  (VENTOLIN  HFA) 108 (90 Base) MCG/ACT inhaler Inhale 2 puffs into the lungs every 6 (six) hours as needed for wheezing or  shortness of breath. 8 g 0   b complex vitamins capsule Take 1 capsule by mouth daily.     budesonide -formoterol  (SYMBICORT ) 160-4.5 MCG/ACT inhaler Inhale 2 puffs into the lungs 2 (two) times daily. 1 each 12   Cholecalciferol (VITAMIN D3) 2000 units TABS Take 1 tablet by mouth daily.     FLUoxetine  (PROZAC ) 20 MG capsule TAKE 1 CAPSULE(20 MG) BY MOUTH DAILY 90 capsule 1   NON FORMULARY Support hose to the waist for dx of venous insufficiency and edema 15-20 mm Hg     omeprazole  (PRILOSEC) 20 MG capsule Take 20 mg by mouth daily.     No current facility-administered medications on file prior to visit.    Review of Systems  Constitutional:  Positive for activity change. Negative for fever.  Genitourinary:  Negative for pelvic  pain, vaginal bleeding and vaginal discharge.       Vaginal dryness  Skin:  Positive for wound.       Wound right foot   Neurological:  Negative for weakness and numbness.       Objective:   Physical Exam Constitutional:      General: She is not in acute distress.    Appearance: Normal appearance. She is obese. She is not ill-appearing or diaphoretic.  Cardiovascular:     Rate and Rhythm: Normal rate and regular rhythm.     Pulses: Normal pulses.  Pulmonary:     Effort: Pulmonary effort is normal. No respiratory distress.  Skin:    Findings: No bruising or rash.     Comments: Several healing (scabbed) puncture wounds on plantar surface of right foot , in arch No erythema  Swelling is minimal  No drainage  No palp mass  No fb seen  Mildly tender around each wound    Neurological:     Mental Status: She is alert.  Psychiatric:        Mood and Affect: Mood normal.           Assessment & Plan:   Problem List Items Addressed This Visit       Genitourinary   Vaginal dryness   More problematic with age  Partial hysterectomy in past  Pt has history of blood clot /avoiding systemic estrogen   Will try estrace cream vaginal , pea size amt  twice weekly  Update if not helpful           Other   Puncture wound of foot excluding toes without complication, right, sequela - Primary   Reviewed UC notes/eval  Stepped on nail several times / left foot  Some improving discomfort and tenderness Reassuring exam  Xray -no fb, no gas or bony change  Tetanus utd  Finishing course of doxycycline    Encouraged to keep wound areas clean /cover if needed Watch for redness/swelling or increase pain or any drainage   Note for work-light duty another week  Monitor closely Call back and Er precautions noted in detail today        Relevant Orders   DG Foot Complete Right (Completed)   Other Visit Diagnoses       Need for influenza vaccination       Relevant Orders   Flu vaccine trivalent PF, 6mos and older(Flulaval,Afluria,Fluarix,Fluzone) (Completed)

## 2024-01-24 NOTE — Assessment & Plan Note (Signed)
 More problematic with age  Partial hysterectomy in past  Pt has history of blood clot /avoiding systemic estrogen   Will try estrace cream vaginal , pea size amt twice weekly  Update if not helpful

## 2024-01-24 NOTE — Patient Instructions (Signed)
 Keep wounds clean with soap and water  Protect from dirt   Xray today  We will reach out with results   Watch for increased  Redness Swelling Pain  Or any drainage or fever let us  know   Light duty for another week

## 2024-02-02 ENCOUNTER — Other Ambulatory Visit: Payer: Self-pay | Admitting: Medical Genetics

## 2024-02-02 DIAGNOSIS — Z006 Encounter for examination for normal comparison and control in clinical research program: Secondary | ICD-10-CM

## 2024-03-02 ENCOUNTER — Other Ambulatory Visit: Payer: Self-pay | Admitting: Family Medicine

## 2024-03-03 ENCOUNTER — Encounter: Payer: Self-pay | Admitting: Emergency Medicine

## 2024-03-03 ENCOUNTER — Ambulatory Visit
Admission: EM | Admit: 2024-03-03 | Discharge: 2024-03-03 | Disposition: A | Discharge status: Home / Self Care | Attending: Emergency Medicine | Admitting: Emergency Medicine

## 2024-03-03 DIAGNOSIS — R1032 Left lower quadrant pain: Secondary | ICD-10-CM

## 2024-03-03 MED ORDER — METRONIDAZOLE 500 MG PO TABS: 500.0000 mg | ORAL_TABLET | Freq: Two times a day (BID) | ORAL | 0 refills | Status: DC

## 2024-03-03 MED ORDER — CIPROFLOXACIN HCL 500 MG PO TABS: 500.0000 mg | ORAL_TABLET | Freq: Two times a day (BID) | ORAL | 0 refills | Status: DC

## 2024-03-03 NOTE — Discharge Instructions (Signed)
 Today you are evaluated for your lower abdominal pain and placement is consistent with diverticulitis, most likely triggered by sprinkles  Take ciprofloxacin  twice daily for 7 days and metronidazole  twice daily for 7 days for treatment avoid alcohol during treatment as it will cause you to feel sick  May continue diet as normal as tolerable, if unable to tolerate foods please increase your fluid intake to maintain your hydration  You may take Tylenol  and or Motrin  as needed for pain  For worsening abdominal pain despite treatment please go to the nearest emergency department for evaluation

## 2024-03-03 NOTE — ED Triage Notes (Signed)
 Patient report pain LLQ that started this morning. Patient states she has a history diverticulitis and feels like this is causing her pain. Rates 5/10. Patient has not taken anything for symptoms.

## 2024-03-03 NOTE — ED Provider Notes (Signed)
 Kari Rice    CSN: [id number] Arrival date & time: 03/03/24  1055      History   Chief Complaint Chief Complaint  Patient presents with   Abdominal Pain    HPI Kari Rice is a 48 y.o. female.   Patient presents for evaluation of constant left lower quadrant abdominal pain beginning around 3 AM this morning.  Worse with sitting but does improve with rest.  Described as a stabbing pain.  Associated mild bloating but denies gas production, heartburn or indigestion, nausea vomiting or diarrhea tolerable to food and liquids.  Endorses that she did had a donut with sprinkles at work 1 day prior.  History of diverticulitis, feels similar.  Past Medical History:  Diagnosis Date   Anemia 08/21   Heavy period due to fibroids   Arthritis    right hand   Depression 01/2020   Attended online therapy   DVT (deep venous thrombosis) (HCC) 2013   GERD (gastroesophageal reflux disease)    Oxygen deficiency    PONV (postoperative nausea and vomiting)    after cholecystectomy   Sleep apnea    CPAP   Varicose veins     Patient Active Problem List   Diagnosis Date Noted   Puncture wound of foot excluding toes without complication, right, sequela 01/24/2024   Vaginal dryness 01/24/2024   Abnormal thyroid blood test 09/23/2023   Menopausal symptoms 09/22/2023   Fatigue 09/22/2023   Current use of proton pump inhibitor 05/26/2023   Vitamin B12 deficiency 05/26/2023   Obesity (BMI 30-39.9) 12/16/2021   OSA (obstructive sleep apnea) 12/03/2021   Sigmoid diverticulosis    Colon cancer screening 04/18/2021   Dysthymia 12/26/2019   Right leg swelling 12/11/2019   Fungal nail infection 10/11/2019   Patellofemoral stress syndrome 07/20/2018   Injury of left toe 01/25/2018   GERD (gastroesophageal reflux disease) 11/22/2017   H/O vaginitis 08/10/2017   Vitamin D  deficiency 01/15/2016   Routine general medical examination at a health care facility 05/29/2014   Encounter  for screening mammogram for breast cancer 05/29/2014   Joint pain 04/10/2014   History of DVT (deep vein thrombosis) 07/06/2012   Varicose veins of bilateral lower extremities with other complications 07/06/2012   Venous (peripheral) insufficiency 12/23/2011   Phlebitis and thrombophlebitis of the leg 11/06/2011   Venous insufficiency, peripheral 10/26/2011   Pedal edema 10/26/2011   Neck pain 10/21/2009    Past Surgical History:  Procedure Laterality Date   ABDOMINAL HYSTERECTOMY     CHOLECYSTECTOMY  Feb. 2006   Gall Bladder   COLONOSCOPY WITH PROPOFOL  N/A 05/23/2021   Procedure: COLONOSCOPY WITH PROPOFOL ;  Surgeon: Unk Corinn Skiff, MD;  Location: [facility];  Service: Gastroenterology;  Laterality: N/A;   HAND SURGERY  2016   NASAL SEPTOPLASTY W/ TURBINOPLASTY Bilateral 10/01/2017   Procedure: NASAL SEPTOPLASTY WITH SUBMUCOCELE RESECTION OF TURBINATE;  Surgeon: Herminio Miu, MD;  Location: [facility];  Service: ENT;  Laterality: Bilateral;  Sleep apnea   ROBOTIC ASSISTED LAPAROSCOPIC HYSTERECTOMY AND SALPINGECTOMY Bilateral 03/13/2020   Procedure: XI ROBOTIC ASSISTED LAPAROSCOPIC HYSTERECTOMY AND SALPINGECTOMY;  Surgeon: Victor Claudell SAUNDERS, MD;  Location: [facility];  Service: Gynecology;  Laterality: Bilateral;   THROMBECTOMY Right 01/13/12 and  01/21/12   Right iliac vein stent and Vein thrombosis    OB History     Gravida  0   Para  0   Term  0   Preterm  0   AB  0   Living  0      SAB  0   IAB  0   Ectopic  0   Multiple  0   Live Births  0            Home Medications    Prior to Admission medications   Medication Sig Start Date End Date Taking? Authorizing Provider  acetaminophen  (TYLENOL ) 650 MG CR tablet Take 1,300 mg by mouth every 8 (eight) hours as needed for pain.    [provider]  albuterol  (VENTOLIN  HFA) 108 (90 Base) MCG/ACT inhaler Inhale 2 puffs into the lungs every 6 (six) hours as needed for wheezing or  shortness of breath. 05/26/23   Tower, Laine LABOR, MD  b complex vitamins capsule Take 1 capsule by mouth daily.    [provider]  budesonide -formoterol  (SYMBICORT ) 160-4.5 MCG/ACT inhaler Inhale 2 puffs into the lungs 2 (two) times daily. 09/08/23   Kasa, Kurian, MD  Cholecalciferol (VITAMIN D3) 2000 units TABS Take 1 tablet by mouth daily.    [provider]  estradiol  (ESTRACE ) 0.01 % CREA vaginal cream Insert pea sized amount of cream to vagina twice weekly 01/24/24   Tower, Laine LABOR, MD  FLUoxetine  (PROZAC ) 20 MG capsule TAKE 1 CAPSULE(20 MG) BY MOUTH DAILY 11/23/23   Tower, Laine LABOR, MD  NON FORMULARY Support hose to the waist for dx of venous insufficiency and edema 15-20 mm Hg    [provider]  omeprazole  (PRILOSEC) 20 MG capsule Take 20 mg by mouth daily.    [provider]    Family History Family History  Problem Relation Age of Onset   Arthritis Mother    Cancer Mother        uterine    Heart disease Father        Heart Disease before age 37   Cancer Father        skin    Hypertension Father    Heart attack Father    COPD Father    Obesity Maternal Grandmother     Social History Social History   Tobacco Use   Smoking status: Never    Passive exposure: Past   Smokeless tobacco: Never  Vaping Use   Vaping status: Never Used  Substance Use Topics   Alcohol use: No   Drug use: Never     Allergies   Penicillins and Sulfa antibiotics   Review of Systems Review of Systems   Physical Exam Triage Vital Signs ED Triage Vitals  Encounter Vitals Group     BP 03/03/24 1237 119/81     Girls Systolic BP Percentile --      Girls Diastolic BP Percentile --      Boys Systolic BP Percentile --      Boys Diastolic BP Percentile --      Pulse Rate 03/03/24 1237 71     Resp 03/03/24 1237 18     Temp 03/03/24 1237 98.2 F (36.8 C)     Temp Source 03/03/24 1237 Oral     SpO2 03/03/24 1237 97 %     Weight --      Height --       Head Circumference --      Peak Flow --      Pain Score 03/03/24 1239 5     Pain Loc --      Pain Education --      Exclude from Growth Chart --    No data found.  Updated  Vital Signs BP 119/81 (BP Location: Left Arm)   Pulse 71   Temp 98.2 F (36.8 C) (Oral)   Resp 18   LMP  (LMP Unknown)   SpO2 97%   Visual Acuity Right Eye Distance:   Left Eye Distance:   Bilateral Distance:    Right Eye Near:   Left Eye Near:    Bilateral Near:     Physical Exam Constitutional:      Appearance: Normal appearance.  Eyes:     Extraocular Movements: Extraocular movements intact.  Pulmonary:     Effort: Pulmonary effort is normal.  Abdominal:     General: Abdomen is flat. Bowel sounds are normal.     Palpations: Abdomen is soft.     Tenderness: There is abdominal tenderness in the left lower quadrant. There is no guarding.  Neurological:     Mental Status: She is alert and oriented to person, place, and time.      UC Treatments / Results  Labs (all labs ordered are listed, but only abnormal results are displayed) Labs Reviewed - No data to display  EKG   Radiology No results found.  Procedures Procedures (including critical care time)  Medications Ordered in UC Medications - No data to display  Initial Impression / Assessment and Plan / UC Course  I have reviewed the triage vital signs and the nursing notes.  Pertinent labs & imaging results that were available during my care of the patient were reviewed by me and considered in my medical decision making (see chart for details).  Left lower quadrant abdominal pain  Tenderness noted on exam, nonguarding, able to sit exam room comfortably, stable for outpatient management, known history of diverticulitis will move forward with flare, prescribe ciprofloxacin  and metronidazole , patient aware of dietary limitations, recommended over-the-counter medications for supportive care and advised to monitor closely, if abdominal  pain worsens in severity instructed to go to the nearest emergency department Final Clinical Impressions(s) / UC Diagnoses   Final diagnoses:  None   Discharge Instructions   None    ED Prescriptions   None    PDMP not reviewed this encounter.   Teresa Shelba SAUNDERS, NP 03/03/24 1321

## 2024-03-05 ENCOUNTER — Other Ambulatory Visit: Payer: Self-pay

## 2024-03-05 ENCOUNTER — Emergency Department
Admission: EM | Admit: 2024-03-05 | Discharge: 2024-03-05 | Disposition: A | Discharge status: Home / Self Care | Attending: Emergency Medicine | Admitting: Emergency Medicine

## 2024-03-05 ENCOUNTER — Emergency Department

## 2024-03-05 DIAGNOSIS — K5792 Diverticulitis of intestine, part unspecified, without perforation or abscess without bleeding: Secondary | ICD-10-CM

## 2024-03-05 DIAGNOSIS — K5732 Diverticulitis of large intestine without perforation or abscess without bleeding: Secondary | ICD-10-CM | POA: Insufficient documentation

## 2024-03-05 DIAGNOSIS — R1032 Left lower quadrant pain: Secondary | ICD-10-CM | POA: Diagnosis present

## 2024-03-05 LAB — URINALYSIS, ROUTINE W REFLEX MICROSCOPIC
Bilirubin Urine: NEGATIVE
Glucose, UA: NEGATIVE mg/dL
Ketones, ur: NEGATIVE mg/dL
Leukocytes,Ua: NEGATIVE
Nitrite: NEGATIVE
Protein, ur: NEGATIVE mg/dL
Specific Gravity, Urine: 1.02 (ref 1.005–1.030)
pH: 5 (ref 5.0–8.0)

## 2024-03-05 LAB — COMPREHENSIVE METABOLIC PANEL WITH GFR
ALT: 20 U/L (ref 0–44)
AST: 20 U/L (ref 15–41)
Albumin: 4.4 g/dL (ref 3.5–5.0)
Alkaline Phosphatase: 91 U/L (ref 38–126)
Anion gap: 8 (ref 5–15)
BUN: 12 mg/dL (ref 6–20)
CO2: 26 mmol/L (ref 22–32)
Calcium: 9.7 mg/dL (ref 8.9–10.3)
Chloride: 103 mmol/L (ref 98–111)
Creatinine, Ser: 0.67 mg/dL (ref 0.44–1.00)
GFR, Estimated: >60 mL/min (ref >60)
Glucose, Bld: 95 mg/dL (ref 70–99)
Potassium: 4.2 mmol/L (ref 3.5–5.1)
Sodium: 137 mmol/L (ref 135–145)
Total Bilirubin: 0.6 mg/dL (ref 0.0–1.2)
Total Protein: 7.4 g/dL (ref 6.5–8.1)

## 2024-03-05 LAB — LIPASE, BLOOD: Lipase: 29 U/L (ref 11–51)

## 2024-03-05 LAB — CBC
HCT: 44.1 % (ref 36.0–46.0)
Hemoglobin: 14.4 g/dL (ref 12.0–15.0)
MCH: 28.9 pg (ref 26.0–34.0)
MCHC: 32.7 g/dL (ref 30.0–36.0)
MCV: 88.4 fL (ref 80.0–100.0)
Platelets: 251 K/uL (ref 150–400)
RBC: 4.99 MIL/uL (ref 3.87–5.11)
RDW: 12.6 % (ref 11.5–15.5)
WBC: 12.3 K/uL — ABNORMAL HIGH (ref 4.0–10.5)
nRBC: 0 % (ref 0.0–0.2)

## 2024-03-05 MED ORDER — CELECOXIB 100 MG PO CAPS: 100.0000 mg | ORAL_CAPSULE | Freq: Every day | ORAL | 0 refills | Status: AC

## 2024-03-05 MED ORDER — IOHEXOL 300 MG/ML  SOLN
100.0000 mL | Freq: Once | INTRAMUSCULAR | Status: AC | PRN
  Administered 2024-03-05: 100 mL via INTRAVENOUS

## 2024-03-05 NOTE — ED Triage Notes (Signed)
 Pt comes with mid lower belly pain since about 2am. Pt has hx of diverticulitis. Pt was here on the 21st for that and was prescribed meds. Pt has been taking them.

## 2024-03-05 NOTE — Discharge Instructions (Signed)
 Your case was discussed with the surgeon on-call, Dr. Tye.  You may continue to take Cipro /Flagyl  as was prescribed to you by the urgent care.  You may also take the celecoxib  that was prescribed for you today.  Please return for any new, worsening, or changing symptoms or other concerns including worsening pain, fever, chills, vomiting, or any other concerns.  It was a pleasure caring for you today.

## 2024-03-05 NOTE — ED Provider Notes (Signed)
 [facility] Provider Note    Event Date/Time   First MD Initiated Contact with Patient 03/05/24 [id number]     (approximate)   History   Abdominal Pain   HPI  Kari Rice is a 48 y.o. female who presents today for evaluation of left lower quadrant pain.  Patient reports that her pain began 2 days ago and feels very similar to the previous time that she had diverticulitis.  She went to urgent care and was started on antibiotics.  She has been taking antibiotics as prescribed and felt improved yesterday, but then her pain worsened last night.  She denies nausea or vomiting.  She reports that she has not had a bowel movement in 2 days.  No urinary symptoms.  No fevers or chills.  She has not eaten anything since 4 PM yesterday.  Patient Active Problem List   Diagnosis Date Noted   Puncture wound of foot excluding toes without complication, right, sequela 01/24/2024   Vaginal dryness 01/24/2024   Abnormal thyroid blood test 09/23/2023   Menopausal symptoms 09/22/2023   Fatigue 09/22/2023   Current use of proton pump inhibitor 05/26/2023   Vitamin B12 deficiency 05/26/2023   Obesity (BMI 30-39.9) 12/16/2021   OSA (obstructive sleep apnea) 12/03/2021   Sigmoid diverticulosis    Colon cancer screening 04/18/2021   Dysthymia 12/26/2019   Right leg swelling 12/11/2019   Fungal nail infection 10/11/2019   Patellofemoral stress syndrome 07/20/2018   Injury of left toe 01/25/2018   GERD (gastroesophageal reflux disease) 11/22/2017   H/O vaginitis 08/10/2017   Vitamin D  deficiency 01/15/2016   Routine general medical examination at a health care facility 05/29/2014   Encounter for screening mammogram for breast cancer 05/29/2014   Joint pain 04/10/2014   History of DVT (deep vein thrombosis) 07/06/2012   Varicose veins of bilateral lower extremities with other complications 07/06/2012   Venous (peripheral) insufficiency 12/23/2011   Phlebitis and  thrombophlebitis of the leg 11/06/2011   Venous insufficiency, peripheral 10/26/2011   Pedal edema 10/26/2011   Neck pain 10/21/2009          Physical Exam   Triage Vital Signs: ED Triage Vitals  Encounter Vitals Group     BP 03/05/24 0726 (!) 148/102     Girls Systolic BP Percentile --      Girls Diastolic BP Percentile --      Boys Systolic BP Percentile --      Boys Diastolic BP Percentile --      Pulse Rate 03/05/24 0726 79     Resp 03/05/24 0726 18     Temp 03/05/24 0726 98 F (36.7 C)     Temp src --      SpO2 03/05/24 0726 95 %     Weight 03/05/24 0725 230 lb (104.3 kg)     Height 03/05/24 0725 5' 4 (1.626 m)     Head Circumference --      Peak Flow --      Pain Score 03/05/24 0725 7     Pain Loc --      Pain Education --      Exclude from Growth Chart --     Most recent vital signs: Vitals:   03/05/24 0726 03/05/24 1000  BP: (!) 148/102 (!) 140/98  Pulse: 79 80  Resp: 18 18  Temp: 98 F (36.7 C)   SpO2: 95% 96%    Physical Exam Vitals and nursing note reviewed.  Constitutional:  General: Awake and alert. No acute distress.    Appearance: Normal appearance. The patient is normal weight.  HENT:     Head: Normocephalic and atraumatic.     Mouth: Mucous membranes are moist.  Eyes:     General: PERRL. Normal EOMs        Right eye: No discharge.        Left eye: No discharge.     Conjunctiva/sclera: Conjunctivae normal.  Cardiovascular:     Rate and Rhythm: Normal rate and regular rhythm.     Pulses: Normal pulses.  Pulmonary:     Effort: Pulmonary effort is normal. No respiratory distress.     Breath sounds: Normal breath sounds.  Abdominal:     Abdomen is soft. There is LLQ abdominal tenderness. No rebound or guarding. No distention. Musculoskeletal:        General: No swelling. Normal range of motion.     Cervical back: Normal range of motion and neck supple.  Skin:    General: Skin is warm and dry.     Capillary Refill: Capillary  refill takes less than 2 seconds.     Findings: No rash.  Neurological:     Mental Status: The patient is awake and alert.      ED Results / Procedures / Treatments   Labs (all labs ordered are listed, but only abnormal results are displayed) Labs Reviewed  CBC - Abnormal; Notable for the following components:      Result Value   WBC 12.3 (*)    All other components within normal limits  URINALYSIS, ROUTINE W REFLEX MICROSCOPIC - Abnormal; Notable for the following components:   Color, Urine YELLOW (*)    APPearance CLEAR (*)    Hgb urine dipstick MODERATE (*)    Bacteria, UA RARE (*)    All other components within normal limits  LIPASE, BLOOD  COMPREHENSIVE METABOLIC PANEL WITH GFR     EKG     RADIOLOGY I independently reviewed and interpreted imaging and agree with radiologists findings.     PROCEDURES:  Critical Care performed:   Procedures   MEDICATIONS ORDERED IN ED: Medications  iohexol  (OMNIPAQUE ) 300 MG/ML solution 100 mL (100 mLs Intravenous Contrast Given 03/05/24 0846)     IMPRESSION / MDM / ASSESSMENT AND PLAN / ED COURSE  I reviewed the triage vital signs and the nursing notes.   Differential diagnosis includes, but is not limited to, diverticulitis, perforation, abscess, colitis.  I reviewed the patient's chart.  Patient was seen in the urgent care 2 days ago for left lower quadrant pain as well.  She was started on Cipro  and Flagyl , though no imaging or blood work was obtained.  Labs obtained in triage are revealing for leukocytosis to 12.6.  I recommended further evaluation with CT imaging which patient is in agreement with.  She was offered analgesia/antiemetic but she declined.  CT scan reveals uncomplicated diverticulitis.  Given that she has already been on antibiotics for the past 2 days, I consulted general surgery, Dr. Tye, who feels that this is not a treatment failure and that we can continue with Cipro /Flagyl .  He also  recommends adding celecoxib .  He has agreed to follow-up with the patient if she needs follow-up.  Patient is in agreement with this plan.  We also discussed very strict return precautions.  Patient understands and agrees with plan.  Discharged in stable condition.  Patient's presentation is most consistent with acute complicated illness / injury requiring diagnostic  workup.   Clinical Course as of 03/05/24 1358  Sun Mar 05, 2024  9058 Discussed with Dr. Tye with general surgery to ensure that this is not considered a treatment failure given that she has had 2 days of the Cipro /Flagyl .  He will review the imaging and call me back. [JP]  0945 Dr. Tye also recommends Celebrex .  Feels that Cipro /Flagyl  is appropriate.  Patient can follow-up with him [JP]    Clinical Course User Index [JP] Vianna Venezia E, PA-C     FINAL CLINICAL IMPRESSION(S) / ED DIAGNOSES   Final diagnoses:  Diverticulitis     Rx / DC Orders   ED Discharge Orders          Ordered    celecoxib  (CELEBREX ) 100 MG capsule  Daily        03/05/24 0954             Note:  This document was prepared using Dragon voice recognition software and may include unintentional dictation errors.   Jakim Drapeau E, PA-C 03/05/24 1358    Bradler, Evan K, MD 03/05/24 [phone]

## 2024-03-14 ENCOUNTER — Ambulatory Visit: Admitting: Family Medicine

## 2024-03-14 ENCOUNTER — Encounter: Payer: Self-pay | Admitting: Family Medicine

## 2024-03-14 VITALS — BP 124/86 | HR 59 | Temp 97.9°F | Ht 64.0 in | Wt 235.0 lb

## 2024-03-14 DIAGNOSIS — K5792 Diverticulitis of intestine, part unspecified, without perforation or abscess without bleeding: Secondary | ICD-10-CM | POA: Insufficient documentation

## 2024-03-14 NOTE — Assessment & Plan Note (Signed)
 Seen in UC and then ER Reviewed hospital records, lab results and studies in detail  Reassuring CT with findings of uncomplicated diverticulitis  Wbc elevated  Today pt (finished cipro  and flagyl  on sat) still has some discomfort and tenderness in LLQ  Reassuring exam/no signs of acute abdomen  Has several more days of celebrex  to finish  Is interested in follow up with surgeon who consulted in ER  Cmet and cbc ordered (if wbc not coming down would extend antibiotic)  Referral done Call back and Er precautions noted in detail today

## 2024-03-14 NOTE — Progress Notes (Signed)
 Subjective:    Patient ID: Kari Rice, female    DOB: February 03, 1976, 48 y.o.   MRN: [id number]  HPI  Wt Readings from Last 3 Encounters:  03/14/24 235 lb (106.6 kg)  03/05/24 230 lb (104.3 kg)  01/24/24 225 lb 2 oz (102.1 kg)   40.34 kg/m  Vitals:   03/14/24 1159  BP: 124/86  Pulse: (!) 59  Temp: 97.9 F (36.6 C)  SpO2: 96%     Pt presents for follow up of UC  ER visits for  Diverticulitis   Seen twice UC 11/21 ED 11/23  Improved and got worse again  On cipro  and flagyl    CT ordered  Noted uncomplicated diverticulitis   no abscess Gen surg was consulted and recommended continue cipro /flagyl  Also recommended adding celecoxib   Dr Tye noted can follow up with them if needed   Lab Results  Component Value Date   NA 137 03/05/2024   K 4.2 03/05/2024   CO2 26 03/05/2024   GLUCOSE 95 03/05/2024   BUN 12 03/05/2024   CREATININE 0.67 03/05/2024   CALCIUM  9.7 03/05/2024   EGFR 106 09/22/2023   GFRNONAA >60 03/05/2024   Lab Results  Component Value Date   ALT 20 03/05/2024   AST 20 03/05/2024   ALKPHOS 91 03/05/2024   BILITOT 0.6 03/05/2024   Lab Results  Component Value Date   WBC 12.3 (H) 03/05/2024   HGB 14.4 03/05/2024   HCT 44.1 03/05/2024   MCV 88.4 03/05/2024   PLT 251 03/05/2024   Lab Results  Component Value Date   LIPASE 29 03/05/2024   Urinalysis - mod hb   CT ABDOMEN PELVIS W CONTRAST Result Date: 03/05/2024 CLINICAL DATA:  LLQ abdominal pain EXAM: CT ABDOMEN AND PELVIS WITH CONTRAST TECHNIQUE: Multidetector CT imaging of the abdomen and pelvis was performed using the standard protocol following bolus administration of intravenous contrast. RADIATION DOSE REDUCTION: This exam was performed according to the departmental dose-optimization program which includes automated exposure control, adjustment of the mA and/or kV according to patient size and/or use of iterative reconstruction technique. CONTRAST:  OMNIPAQUE  IOHEXOL  300 MG/ML   SOLN COMPARISON:  December 24, 2022 FINDINGS: Rice chest: No acute abnormality. Hepatobiliary: Mild diffuse low-attenuation of the liver. Status post cholecystectomy. No new extrahepatic biliary ductal dilation. Pancreas: Unremarkable. No pancreatic ductal dilatation or surrounding inflammatory changes. Spleen: Normal in size without focal abnormality. Adrenals/Urinary Tract: Adrenal glands are unremarkable. Kidneys enhance symmetrically. No hydronephrosis. No obstructing nephrolithiasis. Bladder is unremarkable. Stomach/Bowel: No evidence of bowel obstruction. There are multiple diverticuli most predominant along the confluence of the sigmoid colon with the descending colon. At this area, there is circumferential wall thickening of the colon with adjacent fat stranding and trace fluid. No focal drainable fluid collection or free air. Tiny hiatal hernia. Moderate to large colonic stool burden in the RIGHT and transverse colon. Appendix is normal. Vascular/Lymphatic: RIGHT external iliac vein stent. Abdominal aorta is normal in course and caliber. No suspicious lymphadenopathy. There are few prominent lymph nodes along the stretch of inflamed sigmoid colon measuring approximately 4 mm (series 5, image 64, 61). These are likely reactive. Reproductive: Status post hysterectomy. No adnexal masses. Other: No free air. Musculoskeletal: No acute or significant osseous findings. IMPRESSION: 1. Acute uncomplicated diverticulitis of the sigmoid colon. Recommend correlation with colon cancer screening history. 2. Hepatic steatosis. Electronically Signed   By: Corean Salter M.D.   On: 03/05/2024 09:13    Last colonoscopy 2023  Now  Still some sharp pains in LLQ  Occational radiates to the back   Wonders if she could still have some endometriosis   Unsure if triggers  Hurts more if she has to have a bm    No urinary symptoms  No n/v No diarrhea or constipation No blood in stool  No fever  Tried       Patient Active Problem List   Diagnosis Date Noted   Diverticulitis 03/14/2024   Puncture wound of foot excluding toes without complication, right, sequela 01/24/2024   Vaginal dryness 01/24/2024   Abnormal thyroid blood test 09/23/2023   Menopausal symptoms 09/22/2023   Fatigue 09/22/2023   Current use of proton pump inhibitor 05/26/2023   Vitamin B12 deficiency 05/26/2023   Obesity (BMI 30-39.9) 12/16/2021   OSA (obstructive sleep apnea) 12/03/2021   Sigmoid diverticulosis    Colon cancer screening 04/18/2021   Dysthymia 12/26/2019   Right leg swelling 12/11/2019   Fungal nail infection 10/11/2019   Patellofemoral stress syndrome 07/20/2018   Injury of left toe 01/25/2018   GERD (gastroesophageal reflux disease) 11/22/2017   H/O vaginitis 08/10/2017   Vitamin D  deficiency 01/15/2016   Routine general medical examination at a health care facility 05/29/2014   Encounter for screening mammogram for breast cancer 05/29/2014   Joint pain 04/10/2014   History of DVT (deep vein thrombosis) 07/06/2012   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 07/06/2012   Venous (peripheral) insufficiency 12/23/2011   Phlebitis and thrombophlebitis of the leg 11/06/2011   Venous insufficiency, peripheral 10/26/2011   Pedal edema 10/26/2011   Neck pain 10/21/2009   Past Medical History:  Diagnosis Date   Anemia 08/21   Heavy period due to fibroids   Arthritis    right hand   Asthma    Depression 01/2020   Attended online therapy   DVT (deep venous thrombosis) (HCC) 2013   GERD (gastroesophageal reflux disease)    Oxygen deficiency    PONV (postoperative nausea and vomiting)    after cholecystectomy   Sleep apnea    CPAP   Varicose veins    Past Surgical History:  Procedure Laterality Date   ABDOMINAL HYSTERECTOMY     CHOLECYSTECTOMY  Feb. 2006   Gall Bladder   COLONOSCOPY WITH PROPOFOL  N/A 05/23/2021   Procedure: COLONOSCOPY WITH PROPOFOL ;   Surgeon: Unk Corinn Skiff, MD;  Location: [facility];  Service: Gastroenterology;  Laterality: N/A;   HAND SURGERY  2016   NASAL SEPTOPLASTY W/ TURBINOPLASTY Bilateral 10/01/2017   Procedure: NASAL SEPTOPLASTY WITH SUBMUCOCELE RESECTION OF TURBINATE;  Surgeon: Herminio Miu, MD;  Location: [facility];  Service: ENT;  Laterality: Bilateral;  Sleep apnea   ROBOTIC ASSISTED LAPAROSCOPIC HYSTERECTOMY AND SALPINGECTOMY Bilateral 03/13/2020   Procedure: XI ROBOTIC ASSISTED LAPAROSCOPIC HYSTERECTOMY AND SALPINGECTOMY;  Surgeon: Victor Claudell SAUNDERS, MD;  Location: [facility];  Service: Gynecology;  Laterality: Bilateral;   THROMBECTOMY Right 01/13/12 and  01/21/12   Right iliac vein stent and Vein thrombosis   Social History   Tobacco Use   Smoking status: Never    Passive exposure: Past   Smokeless tobacco: Never  Vaping Use   Vaping status: Never Used  Substance Use Topics   Alcohol use: Not Currently   Drug use: Never   Family History  Problem Relation Age of Onset   Arthritis Mother    Cancer Mother        uterine    Heart disease Father  Heart Disease before age 61   Cancer Father        skin    Hypertension Father    Heart attack Father    COPD Father    Obesity Maternal Grandmother    Allergies  Allergen Reactions   Penicillins Rash   Sulfa Antibiotics Rash   Current Outpatient Medications on File Prior to Visit  Medication Sig Dispense Refill   acetaminophen  (TYLENOL ) 650 MG CR tablet Take 1,300 mg by mouth every 8 (eight) hours as needed for pain.     albuterol  (VENTOLIN  HFA) 108 (90 Base) MCG/ACT inhaler Inhale 2 puffs into the lungs every 6 (six) hours as needed for wheezing or shortness of breath. 8 g 0   b complex vitamins capsule Take 1 capsule by mouth daily.     budesonide -formoterol  (SYMBICORT ) 160-4.5 MCG/ACT inhaler Inhale 2 puffs into the lungs 2 (two) times daily. 1 each 12   celecoxib  (CELEBREX ) 100 MG capsule Take 1 capsule (100 mg  total) by mouth daily for 10 days. 10 capsule 0   Cholecalciferol (VITAMIN D3) 2000 units TABS Take 1 tablet by mouth daily.     estradiol  (ESTRACE ) 0.01 % CREA vaginal cream Insert pea sized amount of cream to vagina twice weekly 42.5 g 1   FLUoxetine  (PROZAC ) 20 MG capsule TAKE 1 CAPSULE(20 MG) BY MOUTH DAILY 90 capsule 1   NON FORMULARY Support hose to the waist for dx of venous insufficiency and edema 15-20 mm Hg     omeprazole  (PRILOSEC) 20 MG capsule Take 20 mg by mouth daily.     No current facility-administered medications on file prior to visit.    Review of Systems  Constitutional:  Negative for activity change, appetite change, fatigue, fever and unexpected weight change.  HENT:  Negative for congestion, ear pain, rhinorrhea, sinus pressure and sore throat.   Eyes:  Negative for pain, redness and visual disturbance.  Respiratory:  Negative for cough, shortness of breath and wheezing.   Cardiovascular:  Negative for chest pain and palpitations.  Gastrointestinal:  Positive for abdominal pain. Negative for abdominal distention, anal bleeding, blood in stool, constipation, diarrhea, nausea, rectal pain and vomiting.  Endocrine: Negative for polydipsia and polyuria.  Genitourinary:  Negative for dysuria, frequency and urgency.  Musculoskeletal:  Negative for arthralgias, back pain and myalgias.  Skin:  Negative for pallor and rash.  Allergic/Immunologic: Negative for environmental allergies.  Neurological:  Negative for dizziness, syncope and headaches.  Hematological:  Negative for adenopathy. Does not bruise/bleed easily.  Psychiatric/Behavioral:  Negative for decreased concentration and dysphoric mood. The patient is not nervous/anxious.        Objective:   Physical Exam Constitutional:      General: She is not in acute distress.    Appearance: Normal appearance. She is well-developed. She is obese. She is not ill-appearing or diaphoretic.  HENT:     Head: Normocephalic  and atraumatic.     Right Ear: External ear normal.     Left Ear: External ear normal.     Nose: Nose normal.  Eyes:     General: No scleral icterus.       Right eye: No discharge.        Left eye: No discharge.     Conjunctiva/sclera: Conjunctivae normal.     Pupils: Pupils are equal, round, and reactive to light.  Neck:     Thyroid: No thyromegaly.     Vascular: No carotid bruit or JVD.  Cardiovascular:  Rate and Rhythm: Normal rate and regular rhythm.     Heart sounds: Normal heart sounds.     No gallop.  Pulmonary:     Effort: Pulmonary effort is normal. No respiratory distress.     Breath sounds: Normal breath sounds. No wheezing or rales.  Abdominal:     General: Abdomen is protuberant. Bowel sounds are normal. There is no distension or abdominal bruit.     Palpations: Abdomen is soft. There is no fluid wave, hepatomegaly, splenomegaly, mass or pulsatile mass.     Tenderness: There is abdominal tenderness in the left Rice quadrant. There is no right CVA tenderness, left CVA tenderness, guarding or rebound.     Comments: Mild tenderness in LLQ  No rebound  No pain with movement or shaking table   Musculoskeletal:        General: No tenderness.     Cervical back: Normal range of motion and neck supple.     Right Rice leg: No edema.     Left Rice leg: No edema.  Lymphadenopathy:     Cervical: No cervical adenopathy.  Skin:    General: Skin is warm and dry.     Coloration: Skin is not pale.     Findings: No erythema or rash.  Neurological:     Mental Status: She is alert.     Cranial Nerves: No cranial nerve deficit.     Motor: No abnormal muscle tone.     Coordination: Coordination normal.     Deep Tendon Reflexes: Reflexes are normal and symmetric. Reflexes normal.  Psychiatric:        Mood and Affect: Mood normal.           Assessment & Plan:   Problem List Items Addressed This Visit       Other   Diverticulitis - Primary   Seen in UC and then  ER Reviewed hospital records, lab results and studies in detail  Reassuring CT with findings of uncomplicated diverticulitis  Wbc elevated  Today pt (finished cipro  and flagyl  on sat) still has some discomfort and tenderness in LLQ  Reassuring exam/no signs of acute abdomen  Has several more days of celebrex  to finish  Is interested in follow up with surgeon who consulted in ER  Cmet and cbc ordered (if wbc not coming down would extend antibiotic)  Referral done Call back and Er precautions noted in detail today            Relevant Orders   Ambulatory referral to General Surgery   CBC with Differential/Platelet   Comprehensive metabolic panel with GFR

## 2024-03-14 NOTE — Patient Instructions (Addendum)
 I will refer to Dr Tye for general surgery to have a visit You can go ahead and call for an appointment   If any issues let us  know   Labs today    Eat bland Avoid nuts and seeds Stay hydrated  Advance your diet as tolerate   Watch for increased pain/ fever/ stool change or blood Let us  know  If severe go to the ER

## 2024-03-15 ENCOUNTER — Ambulatory Visit: Payer: Self-pay | Admitting: Family Medicine

## 2024-03-15 LAB — COMPREHENSIVE METABOLIC PANEL WITH GFR
ALT: 26 IU/L (ref 0–32)
AST: 24 IU/L (ref 0–40)
Albumin: 4 g/dL (ref 3.9–4.9)
Alkaline Phosphatase: 80 IU/L (ref 41–116)
BUN/Creatinine Ratio: 22 (ref 9–23)
BUN: 13 mg/dL (ref 6–24)
Bilirubin Total: 0.2 mg/dL (ref 0.0–1.2)
CO2: 24 mmol/L (ref 20–29)
Calcium: 9.1 mg/dL (ref 8.7–10.2)
Chloride: 105 mmol/L (ref 96–106)
Creatinine, Ser: 0.6 mg/dL (ref 0.57–1.00)
Globulin, Total: 2.2 g/dL (ref 1.5–4.5)
Glucose: 99 mg/dL (ref 70–99)
Potassium: 4.2 mmol/L (ref 3.5–5.2)
Sodium: 140 mmol/L (ref 134–144)
Total Protein: 6.2 g/dL (ref 6.0–8.5)
eGFR: 109 mL/min/1.73 (ref >59)

## 2024-03-15 LAB — CBC WITH DIFFERENTIAL/PLATELET
Basophils Absolute: 0.1 x10E3/uL (ref 0.0–0.2)
Basos: 1 %
EOS (ABSOLUTE): 0.5 x10E3/uL — ABNORMAL HIGH (ref 0.0–0.4)
Eos: 6 %
Hematocrit: 41.3 % (ref 34.0–46.6)
Hemoglobin: 13.1 g/dL (ref 11.1–15.9)
Immature Grans (Abs): 0 x10E3/uL (ref 0.0–0.1)
Immature Granulocytes: 0 %
Lymphocytes Absolute: 3.8 x10E3/uL — ABNORMAL HIGH (ref 0.7–3.1)
Lymphs: 42 %
MCH: 28.5 pg (ref 26.6–33.0)
MCHC: 31.7 g/dL (ref 31.5–35.7)
MCV: 90 fL (ref 79–97)
Monocytes Absolute: 0.6 x10E3/uL (ref 0.1–0.9)
Monocytes: 6 %
Neutrophils Absolute: 4.2 x10E3/uL (ref 1.4–7.0)
Neutrophils: 45 %
Platelets: 279 x10E3/uL (ref 150–450)
RBC: 4.59 x10E6/uL (ref 3.77–5.28)
RDW: 13.1 % (ref 11.7–15.4)
WBC: 9.2 x10E3/uL (ref 3.4–10.8)

## 2024-03-20 ENCOUNTER — Ambulatory Visit: Payer: Self-pay | Admitting: Surgery

## 2024-03-20 NOTE — H&P (Signed)
 Subjective:   CC: Diverticulitis of large intestine without perforation or abscess without bleeding [K57.32]  HPI: referred by Poggi for evaluation of above.  History of Present Illness Kari Rice is a 48 year old female who presents with abdominal pain associated with bowel movements.  She has chronic right lower abdominal pain rated 5/10 that occurs mainly when she needs to have a bowel movement. She has had a laparoscopic hysterectomy but no other abdominal surgeries. She has soft stools and no urinary symptoms.   Past Medical History:  has a past medical history of Blood clot in vein.  Past Surgical History:  has a past surgical history that includes Cholecystectomy; Hysterectomy; and wisdom teeth (Bilateral).  Family History: family history includes Heart disease in her father; No Known Problems in her mother.  Social History:  reports that she has never smoked. She has never used smokeless tobacco. She reports that she does not drink alcohol and does not use drugs.  Current Medications: has a current medication list which includes the following prescription(s): acetaminophen , cholecalciferol, fluoxetine , omeprazole , and atenolol.  Allergies:  Allergies  Allergen Reactions   Penicillin Rash   Penicillins Other (See Comments)   Sulfa (Sulfonamide Antibiotics) Rash    ROS:  A 15 point review of systems was performed and pertinent positives and negatives noted in HPI   Objective:     BP 123/86   Pulse 73   Ht 162.6 cm (5' 4)   Wt (!) 109.3 kg (241 lb)   LMP 06/21/2018 (Exact Date)   BMI 41.37 kg/m   Constitutional :  No distress, cooperative, alert  Lymphatics/Throat:  Supple with no lymphadenopathy  Respiratory:  Clear to auscultation bilaterally  Cardiovascular:  Regular rate and rhythm  Gastrointestinal: Soft, mild LLQ, non-distended, no organomegaly.  Musculoskeletal: Steady gait and movement  Skin: Cool and moist  Psychiatric: Normal affect, non-agitated,  not confused       LABS:  N/a    RADS: CLINICAL DATA:  LLQ abdominal pain   EXAM:  CT ABDOMEN AND PELVIS WITH CONTRAST   TECHNIQUE:  Multidetector CT imaging of the abdomen and pelvis was performed  using the standard protocol following bolus administration of  intravenous contrast.   RADIATION DOSE REDUCTION: This exam was performed according to the  departmental dose-optimization program which includes automated  exposure control, adjustment of the mA and/or kV according to  patient size and/or use of iterative reconstruction technique.   CONTRAST:  OMNIPAQUE  IOHEXOL  300 MG/ML  SOLN   COMPARISON:  December 24, 2022   FINDINGS:  Lower chest: No acute abnormality.   Hepatobiliary: Mild diffuse low-attenuation of the liver. Status  post cholecystectomy. No new extrahepatic biliary ductal dilation.   Pancreas: Unremarkable. No pancreatic ductal dilatation or  surrounding inflammatory changes.   Spleen: Normal in size without focal abnormality.   Adrenals/Urinary Tract: Adrenal glands are unremarkable. Kidneys  enhance symmetrically. No hydronephrosis. No obstructing  nephrolithiasis. Bladder is unremarkable.   Stomach/Bowel: No evidence of bowel obstruction. There are multiple  diverticuli most predominant along the confluence of the sigmoid  colon with the descending colon. At this area, there is  circumferential wall thickening of the colon with adjacent fat  stranding and trace fluid. No focal drainable fluid collection or  free air. Tiny hiatal hernia. Moderate to large colonic stool burden  in the RIGHT and transverse colon. Appendix is normal.   Vascular/Lymphatic: RIGHT external iliac vein stent. Abdominal aorta  is normal in course and caliber.  No suspicious lymphadenopathy.  There are few prominent lymph nodes along the stretch of inflamed  sigmoid colon measuring approximately 4 mm (series 5, image 64, 61).  These are likely reactive.    Reproductive: Status post hysterectomy. No adnexal masses.   Other: No free air.   Musculoskeletal: No acute or significant osseous findings.   IMPRESSION:  1. Acute uncomplicated diverticulitis of the sigmoid colon.  Recommend correlation with colon cancer screening history.  2. Hepatic steatosis.    Electronically Signed    By: Corean Salter M.D.    On: 03/05/2024 09:13   Assessment:      Diverticulitis of large intestine without perforation or abscess without bleeding [K57.32]  Plan:     Discussed pathophisiology.  The risk of laparoscopic colon resection surgery includes, but not limited to, recurrence, bleeding, chronic pain, post-op infxn, post-op SBO or ileus, hernias, resection of bowel, re-anastamosis, possible ostomy placement and need for re-operation to address said risks. The risks of general anesthetic, if used, includes MI, CVA, sudden death or even reaction to anesthetic medications also discussed. Alternatives include continued observation.  Benefits include possible symptom relief.   Typical post-op recovery time of additional days in hospital for observation afterwards also discussed.   Prep ordered.  Will proceed with ERAS protocol as well.       The patient verbalized understanding and all questions were answered to the patient's satisfaction.  labs/images/medications/previous chart entries reviewed personally and relevant changes/updates noted above.

## 2024-04-17 ENCOUNTER — Encounter
Admission: RE | Admit: 2024-04-17 | Discharge: 2024-04-17 | Disposition: A | Discharge status: Home / Self Care | Source: Ambulatory Visit | Attending: Surgery | Admitting: Surgery

## 2024-04-17 ENCOUNTER — Other Ambulatory Visit: Payer: Self-pay

## 2024-04-17 VITALS — BP 129/87 | HR 79 | Resp 16

## 2024-04-17 DIAGNOSIS — Z01812 Encounter for preprocedural laboratory examination: Secondary | ICD-10-CM

## 2024-04-17 DIAGNOSIS — Z01810 Encounter for preprocedural cardiovascular examination: Secondary | ICD-10-CM | POA: Diagnosis not present

## 2024-04-17 DIAGNOSIS — Z01818 Encounter for other preprocedural examination: Secondary | ICD-10-CM | POA: Insufficient documentation

## 2024-04-17 DIAGNOSIS — K5730 Diverticulosis of large intestine without perforation or abscess without bleeding: Secondary | ICD-10-CM

## 2024-04-17 HISTORY — DX: Diverticulitis of large intestine without perforation or abscess without bleeding: K57.32

## 2024-04-17 LAB — TYPE AND SCREEN
ABO/RH(D): O POS
Antibody Screen: NEGATIVE

## 2024-04-17 NOTE — Patient Instructions (Addendum)
 Your procedure is scheduled on: 04/24/24 - Monday Report to the Registration Desk on the 1st floor of [facility]. To find out your arrival time, please call [phone] between 1PM - 3PM on: 04/21/24 - Friday If your arrival time is 6:00 am, do not arrive before that time as the [facility] entrance doors do not open until 6:00 am.  REMEMBER: Instructions that are not followed completely may result in serious medical risk, up to and including death; or upon the discretion of your surgeon and anesthesiologist your surgery may need to be rescheduled.  Please take colon prep and antibiotics as directed   metroNIDAZOLE  (FLAGYL ) 500 MG tablet  Take 2 tablets at 2pm, 3pm, and 10pm the day before surgery         neomycin 500 mg tablet  Take 2 tablets at 2pm, 3pm, and 10pm the day before surgery         One week prior to surgery: Stop Anti-inflammatories (NSAIDS) such as Advil , Aleve, Ibuprofen , Motrin , Naproxen, Naprosyn and Aspirin based products such as Excedrin, Goody's Powder, BC Powder. You may continue to take Tylenol  if needed for pain up until the day of surgery.  Stop ANY OVER THE COUNTER supplements until after surgery.- VITAMIN D3   ON THE DAY OF SURGERY ONLY TAKE THESE MEDICATIONS WITH SIPS OF WATER:  FLUoxetine  (PROZAC )  omeprazole  (PRILOSEC)   Use inhalers on the day of surgery and bring to the hospital.   No Alcohol for 24 hours before or after surgery.  No Smoking including e-cigarettes for 24 hours before surgery.  No chewable tobacco products for at least 6 hours before surgery.  No nicotine patches on the day of surgery.  Do not use any recreational drugs for at least a week (preferably 2 weeks) before your surgery.  Please be advised that the combination of cocaine and anesthesia may have negative outcomes, up to and including death. If you test positive for cocaine, your surgery will be cancelled.  On the morning of surgery brush your teeth with  toothpaste and water, you may rinse your mouth with mouthwash if you wish. Do not swallow any toothpaste or mouthwash.  Use CHG Soap or wipes as directed on instruction sheet.  Do not wear jewelry, make-up, hairpins, clips or nail polish.  For welded (permanent) jewelry: bracelets, anklets, waist bands, etc.  Please have this removed prior to surgery.  If it is not removed, there is a chance that hospital personnel will need to cut it off on the day of surgery.  Do not wear lotions, powders, or perfumes.   Do not shave body hair from the neck down 48 hours before surgery.  Contact lenses, hearing aids and dentures may not be worn into surgery.  Do not bring valuables to the hospital. [facility] is not responsible for any missing/lost belongings or valuables.   Notify your doctor if there is any change in your medical condition (cold, fever, infection).  Wear comfortable clothing (specific to your surgery type) to the hospital.  After surgery, you can help prevent lung complications by doing breathing exercises.  Take deep breaths and cough every 1-2 hours. Your doctor may order a device called an Incentive Spirometer to help you take deep breaths.  When coughing or sneezing, hold a pillow firmly against your incision with both hands. This is called splinting. Doing this helps protect your incision. It also decreases belly discomfort.  If you are being admitted to the hospital overnight, leave  your suitcase in the car. After surgery it may be brought to your room.  In case of increased patient census, it may be necessary for you, the patient, to continue your postoperative care in the Same Day Surgery department.  If you are being discharged the day of surgery, you will not be allowed to drive home. You will need a responsible individual to drive you home and stay with you for 24 hours after surgery.   If you are taking public transportation, you will need to have a responsible  individual with you.  Please call the Pre-admissions Testing Dept. at [phone] if you have any questions about these instructions.  Surgery Visitation Policy:  Patients having surgery or a procedure may have two visitors.  Children under the age of 72 must have an adult with them who is not the patient.  Inpatient Visitation:    Visiting hours are 7 a.m. to 8 p.m. Up to four visitors are allowed at one time in a patient room. The visitors may rotate out with other people during the day.  One visitor age 19 or older may stay with the patient overnight and must be in the room by 8 p.m.   Merchandiser, Retail to address health-related social needs:  [email or web address]                                                                                                             Preparing for Surgery with CHLORHEXIDINE  GLUCONATE (CHG) Soap  Chlorhexidine  Gluconate (CHG) Soap  o An antiseptic cleaner that kills germs and bonds with the skin to continue killing germs even after washing  o Used for showering the night before surgery and morning of surgery  Before surgery, you can play an important role by reducing the number of germs on your skin.  CHG (Chlorhexidine  gluconate) soap is an antiseptic cleanser which kills germs and bonds with the skin to continue killing germs even after washing.  Please do not use if you have an allergy  to CHG or antibacterial soaps. If your skin becomes reddened/irritated stop using the CHG.  1. Shower the NIGHT BEFORE SURGERY with CHG soap.  2. If you choose to wash your hair, wash your hair first as usual with your normal shampoo.  3. After shampooing, rinse your hair and body thoroughly to remove the shampoo.  4. Use CHG as you would any other liquid soap. You can apply CHG directly to the skin and wash gently with a clean washcloth.  5. Apply the CHG soap to your body only from the neck down. Do not use on open wounds or  open sores. Avoid contact with your eyes, ears, mouth, and genitals (private parts). Wash face and genitals (private parts) with your normal soap.  6. Wash thoroughly, paying special attention to the area where your surgery will be performed.  7. Thoroughly rinse your body with warm water.  8. Do not shower/wash with your normal soap after using and rinsing off the CHG soap.  9. Do not use  lotions, oils, etc., after showering with CHG.  10. Pat yourself dry with a clean towel.  11. Wear clean pajamas to bed the night before surgery.  12. Place clean sheets on your bed the night of your shower and do not sleep with pets.  13. Do not apply any deodorants/lotions/powders.  14. Please wear clean clothes to the hospital.  15. Remember to brush your teeth with your regular toothpaste.  How to Use an Incentive Spirometer  An incentive spirometer is a tool that measures how well you are filling your lungs with each breath. Learning to take long, deep breaths using this tool can help you keep your lungs clear and active. This may help to reverse or lessen your chance of developing breathing (pulmonary) problems, especially infection. You may be asked to use a spirometer: After a surgery. If you have a lung problem or a history of smoking. After a long period of time when you have been unable to move or be active. If the spirometer includes an indicator to show the highest number that you have reached, your health care provider or respiratory therapist will help you set a goal. Keep a log of your progress as told by your health care provider. What are the risks? Breathing too quickly may cause dizziness or cause you to pass out. Take your time so you do not get dizzy or light-headed. If you are in pain, you may need to take pain medicine before doing incentive spirometry. It is harder to take a deep breath if you are having pain. How to use your incentive spirometer  Sit up on the edge of  your bed or on a chair. Hold the incentive spirometer so that it is in an upright position. Before you use the spirometer, breathe out normally. Place the mouthpiece in your mouth. Make sure your lips are closed tightly around it. Breathe in slowly and as deeply as you can through your mouth, causing the piston or the ball to rise toward the top of the chamber. Hold your breath for 3-5 seconds, or for as long as possible. If the spirometer includes a coach indicator, use this to guide you in breathing. Slow down your breathing if the indicator goes above the marked areas. Remove the mouthpiece from your mouth and breathe out normally. The piston or ball will return to the bottom of the chamber. Rest for a few seconds, then repeat the steps 10 or more times. Take your time and take a few normal breaths between deep breaths so that you do not get dizzy or light-headed. Do this every 1-2 hours when you are awake. If the spirometer includes a goal marker to show the highest number you have reached (best effort), use this as a goal to work toward during each repetition. After each set of 10 deep breaths, cough a few times. This will help to make sure that your lungs are clear. If you have an incision on your chest or abdomen from surgery, place a pillow or a rolled-up towel firmly against the incision when you cough. This can help to reduce pain while taking deep breaths and coughing. General tips When you are able to get out of bed: Walk around often. Continue to take deep breaths and cough in order to clear your lungs. Keep using the incentive spirometer until your health care provider says it is okay to stop using it. If you have been in the hospital, you may be told to keep using the  spirometer at home. Contact a health care provider if: You are having difficulty using the spirometer. You have trouble using the spirometer as often as instructed. Your pain medicine is not giving enough relief for  you to use the spirometer as told. You have a fever. Get help right away if: You develop shortness of breath. You develop a cough with bloody mucus from the lungs. You have fluid or blood coming from an incision site after you cough. Summary An incentive spirometer is a tool that can help you learn to take long, deep breaths to keep your lungs clear and active. You may be asked to use a spirometer after a surgery, if you have a lung problem or a history of smoking, or if you have been inactive for a long period of time. Use your incentive spirometer as instructed every 1-2 hours while you are awake. If you have an incision on your chest or abdomen, place a pillow or a rolled-up towel firmly against your incision when you cough. This will help to reduce pain. Get help right away if you have shortness of breath, you cough up bloody mucus, or blood comes from your incision when you cough. This information is not intended to replace advice given to you by your health care provider. Make sure you discuss any questions you have with your health care provider. Document Revised: 06/19/2019 Document Reviewed: 06/19/2019 Elsevier Patient Education  2023 [employer].

## 2024-04-17 NOTE — Consult Note (Addendum)
 WOC Nurse requested for preoperative stoma site marking  Discussed surgical procedure and stoma creation with patient.   Explained role of the WOC nurse team.  Provided the patient with educational booklet and provided samples of pouching options.  Answered patient and family questions.   Examined patient sitting, and standing in order to place the marking in the patient's visual field, away from any creases or abdominal contour issues and within the rectus muscle.  Attempted to mark below the patient's belt line.   Marked for colostomy in the LLQ 6 cm to the left of the umbilicus and 6 cm below the umbilicus.  Marked for ileostomy in the RLQ 6 cm to the right of the umbilicus and 6 cm below the umbilicus.  Patient's abdomen cleansed with CHG wipes at site markings, allowed to air dry prior to marking.Covered mark with thin film transparent dressing to preserve mark until date of surgery.   WOC team will follow further. Please reconsult if further assistance is needed. Thank-you,  Lela Holm MSN, RN, CWCN, CNS.  (Phone [phone])

## 2024-04-24 ENCOUNTER — Inpatient Hospital Stay
Admission: RE | Admit: 2024-04-24 | Discharge: 2024-04-27 | DRG: 331 | Disposition: A | Discharge status: Home / Self Care | Attending: Surgery | Admitting: Surgery

## 2024-04-24 ENCOUNTER — Encounter: Payer: Self-pay | Admitting: Surgery

## 2024-04-24 ENCOUNTER — Inpatient Hospital Stay

## 2024-04-24 ENCOUNTER — Encounter: Admission: RE | Payer: Self-pay | Source: Home / Self Care

## 2024-04-24 ENCOUNTER — Other Ambulatory Visit: Payer: Self-pay

## 2024-04-24 DIAGNOSIS — Z79899 Other long term (current) drug therapy: Secondary | ICD-10-CM

## 2024-04-24 DIAGNOSIS — K219 Gastro-esophageal reflux disease without esophagitis: Secondary | ICD-10-CM | POA: Diagnosis present

## 2024-04-24 DIAGNOSIS — Z8249 Family history of ischemic heart disease and other diseases of the circulatory system: Secondary | ICD-10-CM | POA: Diagnosis not present

## 2024-04-24 DIAGNOSIS — K5792 Diverticulitis of intestine, part unspecified, without perforation or abscess without bleeding: Principal | ICD-10-CM | POA: Diagnosis present

## 2024-04-24 DIAGNOSIS — K5732 Diverticulitis of large intestine without perforation or abscess without bleeding: Principal | ICD-10-CM | POA: Diagnosis present

## 2024-04-24 DIAGNOSIS — Z86718 Personal history of other venous thrombosis and embolism: Secondary | ICD-10-CM | POA: Diagnosis not present

## 2024-04-24 DIAGNOSIS — Z882 Allergy status to sulfonamides status: Secondary | ICD-10-CM | POA: Diagnosis not present

## 2024-04-24 DIAGNOSIS — Z9049 Acquired absence of other specified parts of digestive tract: Secondary | ICD-10-CM

## 2024-04-24 DIAGNOSIS — Z880 Allergy status to penicillin: Secondary | ICD-10-CM

## 2024-04-24 DIAGNOSIS — Z6839 Body mass index (BMI) 39.0-39.9, adult: Secondary | ICD-10-CM

## 2024-04-24 DIAGNOSIS — E669 Obesity, unspecified: Secondary | ICD-10-CM | POA: Diagnosis present

## 2024-04-24 DIAGNOSIS — Z90710 Acquired absence of both cervix and uterus: Secondary | ICD-10-CM | POA: Diagnosis not present

## 2024-04-24 HISTORY — PX: COLECTOMY, SIGMOID, ROBOT-ASSISTED: SHX7542

## 2024-04-24 LAB — CBC
HCT: 41.7 % (ref 36.0–46.0)
Hemoglobin: 14.2 g/dL (ref 12.0–15.0)
MCH: 28.8 pg (ref 26.0–34.0)
MCHC: 34.1 g/dL (ref 30.0–36.0)
MCV: 84.6 fL (ref 80.0–100.0)
Platelets: 201 K/uL (ref 150–400)
RBC: 4.93 MIL/uL (ref 3.87–5.11)
RDW: 12.6 % (ref 11.5–15.5)
WBC: 14.5 K/uL — ABNORMAL HIGH (ref 4.0–10.5)
nRBC: 0 % (ref 0.0–0.2)

## 2024-04-24 LAB — CREATININE, SERUM
Creatinine, Ser: 0.73 mg/dL (ref 0.44–1.00)
GFR, Estimated: >60 mL/min

## 2024-04-24 MED ORDER — GLYCOPYRROLATE 0.2 MG/ML IJ SOLN
INTRAMUSCULAR | Status: AC
  Filled 2024-04-24: qty 1

## 2024-04-24 MED ORDER — SODIUM CHLORIDE 0.9 % IV SOLN
INTRAVENOUS | Status: AC
  Filled 2024-04-24: qty 2

## 2024-04-24 MED ORDER — ROCURONIUM BROMIDE 10 MG/ML (PF) SYRINGE
PREFILLED_SYRINGE | INTRAVENOUS | Status: AC
  Filled 2024-04-24: qty 10

## 2024-04-24 MED ORDER — GABAPENTIN 300 MG PO CAPS
300.0000 mg | ORAL_CAPSULE | Freq: Two times a day (BID) | ORAL | Status: DC
  Administered 2024-04-24 – 2024-04-26 (×6): 300 mg via ORAL
  Filled 2024-04-24 (×6): qty 1

## 2024-04-24 MED ORDER — CHLORHEXIDINE GLUCONATE 0.12 % MT SOLN
OROMUCOSAL | Status: AC
  Filled 2024-04-24: qty 15

## 2024-04-24 MED ORDER — MORPHINE SULFATE (PF) 2 MG/ML IV SOLN: 1.0000 mg | INTRAVENOUS | Status: DC | PRN

## 2024-04-24 MED ORDER — CELECOXIB 200 MG PO CAPS
200.0000 mg | ORAL_CAPSULE | Freq: Two times a day (BID) | ORAL | Status: DC
  Administered 2024-04-24 – 2024-04-26 (×6): 200 mg via ORAL
  Filled 2024-04-24 (×7): qty 1

## 2024-04-24 MED ORDER — ENSURE PRE-SURGERY PO LIQD
592.0000 mL | Freq: Once | ORAL | Status: DC
  Filled 2024-04-24: qty 592

## 2024-04-24 MED ORDER — BUPIVACAINE LIPOSOME 1.3 % IJ SUSP
INTRAMUSCULAR | Status: AC
  Filled 2024-04-24: qty 20

## 2024-04-24 MED ORDER — ENSURE PRE-SURGERY PO LIQD
296.0000 mL | Freq: Once | ORAL | Status: DC
  Filled 2024-04-24: qty 296

## 2024-04-24 MED ORDER — DEXMEDETOMIDINE HCL IN NACL 80 MCG/20ML IV SOLN
INTRAVENOUS | Status: DC | PRN
  Administered 2024-04-24: 12 ug via INTRAVENOUS
  Administered 2024-04-24: 20 ug via INTRAVENOUS

## 2024-04-24 MED ORDER — KETAMINE HCL 50 MG/5ML IJ SOSY
PREFILLED_SYRINGE | INTRAMUSCULAR | Status: AC
  Filled 2024-04-24: qty 5

## 2024-04-24 MED ORDER — ALVIMOPAN 12 MG PO CAPS
ORAL_CAPSULE | ORAL | Status: AC
  Filled 2024-04-24: qty 1

## 2024-04-24 MED ORDER — ROCURONIUM BROMIDE 100 MG/10ML IV SOLN
INTRAVENOUS | Status: DC | PRN
  Administered 2024-04-24 (×3): 50 mg via INTRAVENOUS

## 2024-04-24 MED ORDER — BUPIVACAINE-EPINEPHRINE (PF) 0.5% -1:200000 IJ SOLN
INTRAMUSCULAR | Status: DC | PRN
  Administered 2024-04-24: 30 mL via PERINEURAL

## 2024-04-24 MED ORDER — DEXAMETHASONE SOD PHOSPHATE PF 10 MG/ML IJ SOLN
INTRAMUSCULAR | Status: DC | PRN
  Administered 2024-04-24: 10 mg via INTRAVENOUS

## 2024-04-24 MED ORDER — KETAMINE HCL 50 MG/5ML IJ SOSY
PREFILLED_SYRINGE | INTRAMUSCULAR | Status: DC | PRN
  Administered 2024-04-24 (×2): 20 mg via INTRAVENOUS

## 2024-04-24 MED ORDER — LACTATED RINGERS IV SOLN: INTRAVENOUS | Status: DC

## 2024-04-24 MED ORDER — BUPIVACAINE-EPINEPHRINE (PF) 0.5% -1:200000 IJ SOLN
INTRAMUSCULAR | Status: AC
  Filled 2024-04-24: qty 30

## 2024-04-24 MED ORDER — ACETAMINOPHEN 10 MG/ML IV SOLN: 1000.0000 mg | Freq: Once | INTRAVENOUS | Status: DC | PRN

## 2024-04-24 MED ORDER — ENOXAPARIN SODIUM 40 MG/0.4ML IJ SOSY
40.0000 mg | PREFILLED_SYRINGE | INTRAMUSCULAR | Status: DC
  Administered 2024-04-25: 40 mg via SUBCUTANEOUS
  Filled 2024-04-24: qty 0.4

## 2024-04-24 MED ORDER — SUGAMMADEX SODIUM 200 MG/2ML IV SOLN
INTRAVENOUS | Status: DC | PRN
  Administered 2024-04-24: 200 mg via INTRAVENOUS

## 2024-04-24 MED ORDER — 0.9 % SODIUM CHLORIDE (POUR BTL) OPTIME
TOPICAL | Status: DC | PRN
  Administered 2024-04-24: 500 mL

## 2024-04-24 MED ORDER — MEPERIDINE HCL 25 MG/ML IJ SOLN: 6.2500 mg | INTRAMUSCULAR | Status: DC | PRN

## 2024-04-24 MED ORDER — FENTANYL CITRATE (PF) 100 MCG/2ML IJ SOLN
INTRAMUSCULAR | Status: DC | PRN
  Administered 2024-04-24 (×4): 50 ug via INTRAVENOUS

## 2024-04-24 MED ORDER — LACTATED RINGERS IV SOLN: INTRAVENOUS | Status: DC | PRN

## 2024-04-24 MED ORDER — ONDANSETRON HCL 4 MG/2ML IJ SOLN
INTRAMUSCULAR | Status: DC | PRN
  Administered 2024-04-24: 4 mg via INTRAVENOUS

## 2024-04-24 MED ORDER — PROPOFOL 10 MG/ML IV BOLUS
INTRAVENOUS | Status: DC | PRN
  Administered 2024-04-24: 125 ug/kg/min via INTRAVENOUS
  Administered 2024-04-24: 110 ug/kg/min via INTRAVENOUS

## 2024-04-24 MED ORDER — MIDAZOLAM HCL 2 MG/2ML IJ SOLN
INTRAMUSCULAR | Status: AC
  Filled 2024-04-24: qty 2

## 2024-04-24 MED ORDER — FENTANYL CITRATE (PF) 100 MCG/2ML IJ SOLN
INTRAMUSCULAR | Status: AC
  Filled 2024-04-24: qty 2

## 2024-04-24 MED ORDER — ONDANSETRON HCL 4 MG/2ML IJ SOLN: 4.0000 mg | Freq: Four times a day (QID) | INTRAMUSCULAR | Status: DC | PRN

## 2024-04-24 MED ORDER — SODIUM CHLORIDE 0.9 % IV SOLN
2.0000 g | INTRAVENOUS | Status: AC
  Administered 2024-04-24: 2 g via INTRAVENOUS

## 2024-04-24 MED ORDER — MIDAZOLAM HCL (PF) 2 MG/2ML IJ SOLN
INTRAMUSCULAR | Status: DC | PRN
  Administered 2024-04-24: 2 mg via INTRAVENOUS

## 2024-04-24 MED ORDER — EPHEDRINE SULFATE-NACL 50-0.9 MG/10ML-% IV SOSY
PREFILLED_SYRINGE | INTRAVENOUS | Status: DC | PRN
  Administered 2024-04-24 (×2): 10 mg via INTRAVENOUS

## 2024-04-24 MED ORDER — PROPOFOL 1000 MG/100ML IV EMUL
INTRAVENOUS | Status: AC
  Filled 2024-04-24: qty 100

## 2024-04-24 MED ORDER — ORAL CARE MOUTH RINSE: 15.0000 mL | Freq: Once | OROMUCOSAL | Status: AC

## 2024-04-24 MED ORDER — HYDROMORPHONE HCL 1 MG/ML IJ SOLN
INTRAMUSCULAR | Status: DC | PRN
  Administered 2024-04-24: .5 mg via INTRAVENOUS

## 2024-04-24 MED ORDER — GABAPENTIN 300 MG PO CAPS
ORAL_CAPSULE | ORAL | Status: AC
  Filled 2024-04-24: qty 1

## 2024-04-24 MED ORDER — CHLORHEXIDINE GLUCONATE CLOTH 2 % EX PADS: 6.0000 | MEDICATED_PAD | Freq: Once | CUTANEOUS | Status: DC

## 2024-04-24 MED ORDER — FLUTICASONE FUROATE-VILANTEROL 100-25 MCG/ACT IN AEPB
1.0000 | INHALATION_SPRAY | Freq: Every day | RESPIRATORY_TRACT | Status: DC
  Administered 2024-04-25 – 2024-04-27 (×3): 1 via RESPIRATORY_TRACT
  Filled 2024-04-24: qty 28

## 2024-04-24 MED ORDER — HYDROMORPHONE HCL 1 MG/ML IJ SOLN
INTRAMUSCULAR | Status: AC
  Filled 2024-04-24: qty 1

## 2024-04-24 MED ORDER — GABAPENTIN 300 MG PO CAPS
300.0000 mg | ORAL_CAPSULE | ORAL | Status: AC
  Administered 2024-04-24: 300 mg via ORAL

## 2024-04-24 MED ORDER — CELECOXIB 200 MG PO CAPS
ORAL_CAPSULE | ORAL | Status: AC
  Filled 2024-04-24: qty 1

## 2024-04-24 MED ORDER — PANTOPRAZOLE SODIUM 40 MG PO TBEC
40.0000 mg | DELAYED_RELEASE_TABLET | Freq: Every day | ORAL | Status: DC
  Administered 2024-04-25 – 2024-04-26 (×2): 40 mg via ORAL
  Filled 2024-04-24 (×2): qty 1

## 2024-04-24 MED ORDER — OXYCODONE HCL 5 MG PO TABS: 5.0000 mg | ORAL_TABLET | ORAL | Status: DC | PRN

## 2024-04-24 MED ORDER — DEXAMETHASONE SOD PHOSPHATE PF 10 MG/ML IJ SOLN
INTRAMUSCULAR | Status: AC
  Filled 2024-04-24: qty 1

## 2024-04-24 MED ORDER — CHLORHEXIDINE GLUCONATE 0.12 % MT SOLN
15.0000 mL | Freq: Once | OROMUCOSAL | Status: AC
  Administered 2024-04-24: 15 mL via OROMUCOSAL

## 2024-04-24 MED ORDER — PHENYLEPHRINE 80 MCG/ML (10ML) SYRINGE FOR IV PUSH (FOR BLOOD PRESSURE SUPPORT)
PREFILLED_SYRINGE | INTRAVENOUS | Status: DC | PRN
  Administered 2024-04-24: 80 ug via INTRAVENOUS

## 2024-04-24 MED ORDER — CEFAZOLIN SODIUM-DEXTROSE 2-3 GM-%(50ML) IV SOLR
INTRAVENOUS | Status: DC | PRN
  Administered 2024-04-24: 2 g via INTRAVENOUS

## 2024-04-24 MED ORDER — LIDOCAINE HCL (CARDIAC) PF 100 MG/5ML IV SOSY
PREFILLED_SYRINGE | INTRAVENOUS | Status: DC | PRN
  Administered 2024-04-24 (×2): 100 mg via INTRAVENOUS

## 2024-04-24 MED ORDER — INDOCYANINE GREEN 25 MG IJ SOLR
INTRAMUSCULAR | Status: DC | PRN
  Administered 2024-04-24: 5 mg via INTRAVENOUS

## 2024-04-24 MED ORDER — KETOROLAC TROMETHAMINE 30 MG/ML IJ SOLN
INTRAMUSCULAR | Status: AC
  Filled 2024-04-24: qty 1

## 2024-04-24 MED ORDER — GLYCOPYRROLATE 0.2 MG/ML IJ SOLN
INTRAMUSCULAR | Status: DC | PRN
  Administered 2024-04-24: .2 mg via INTRAVENOUS

## 2024-04-24 MED ORDER — SODIUM CHLORIDE (PF) 0.9 % IJ SOLN
INTRAMUSCULAR | Status: AC
  Filled 2024-04-24: qty 50

## 2024-04-24 MED ORDER — LIDOCAINE HCL (PF) 2 % IJ SOLN
INTRAMUSCULAR | Status: AC
  Filled 2024-04-24: qty 5

## 2024-04-24 MED ORDER — ONDANSETRON HCL 4 MG/2ML IJ SOLN: 4.0000 mg | Freq: Once | INTRAMUSCULAR | Status: DC | PRN

## 2024-04-24 MED ORDER — ONDANSETRON 4 MG PO TBDP: 4.0000 mg | ORAL_TABLET | Freq: Four times a day (QID) | ORAL | Status: DC | PRN

## 2024-04-24 MED ORDER — ACETAMINOPHEN 500 MG PO TABS
1000.0000 mg | ORAL_TABLET | ORAL | Status: AC
  Administered 2024-04-24: 1000 mg via ORAL

## 2024-04-24 MED ORDER — CELECOXIB 200 MG PO CAPS
200.0000 mg | ORAL_CAPSULE | ORAL | Status: AC
  Administered 2024-04-24: 200 mg via ORAL

## 2024-04-24 MED ORDER — FLUOXETINE HCL 20 MG PO CAPS
20.0000 mg | ORAL_CAPSULE | Freq: Every day | ORAL | Status: DC
  Administered 2024-04-25 – 2024-04-26 (×2): 20 mg via ORAL
  Filled 2024-04-24 (×2): qty 1

## 2024-04-24 MED ORDER — HYDROMORPHONE HCL 1 MG/ML IJ SOLN: 0.2500 mg | INTRAMUSCULAR | Status: DC | PRN

## 2024-04-24 MED ORDER — ONDANSETRON HCL 4 MG/2ML IJ SOLN
INTRAMUSCULAR | Status: AC
  Filled 2024-04-24: qty 2

## 2024-04-24 MED ORDER — TRAMADOL HCL 50 MG PO TABS: 50.0000 mg | ORAL_TABLET | Freq: Four times a day (QID) | ORAL | Status: DC | PRN

## 2024-04-24 MED ORDER — OXYCODONE HCL 5 MG PO TABS: 5.0000 mg | ORAL_TABLET | Freq: Once | ORAL | Status: DC | PRN

## 2024-04-24 MED ORDER — ACETAMINOPHEN 325 MG PO TABS: 650.0000 mg | ORAL_TABLET | Freq: Four times a day (QID) | ORAL | Status: DC | PRN

## 2024-04-24 MED ORDER — DEXMEDETOMIDINE HCL IN NACL 80 MCG/20ML IV SOLN
INTRAVENOUS | Status: AC
  Filled 2024-04-24: qty 20

## 2024-04-24 MED ORDER — ACETAMINOPHEN 500 MG PO TABS
ORAL_TABLET | ORAL | Status: AC
  Filled 2024-04-24: qty 2

## 2024-04-24 MED ORDER — OXYCODONE HCL 5 MG/5ML PO SOLN: 5.0000 mg | Freq: Once | ORAL | Status: DC | PRN

## 2024-04-24 MED ORDER — CEFAZOLIN SODIUM-DEXTROSE 2-4 GM/100ML-% IV SOLN
INTRAVENOUS | Status: AC
  Filled 2024-04-24: qty 100

## 2024-04-24 NOTE — H&P (Signed)
 Subjective:   CC: Diverticulitis of large intestine without perforation or abscess without bleeding [K57.32]  HPI: referred by Poggi for evaluation of above.  History of Present Illness Kari Rice is a 49 year old female who presents with abdominal pain associated with bowel movements.  She has chronic right lower abdominal pain rated 5/10 that occurs mainly when she needs to have a bowel movement. She has had a laparoscopic hysterectomy but no other abdominal surgeries. She has soft stools and no urinary symptoms.   Past Medical History:  has a past medical history of Blood clot in vein.  Past Surgical History:  has a past surgical history that includes Cholecystectomy; Hysterectomy; and wisdom teeth (Bilateral).  Family History: family history includes Heart disease in her father; No Known Problems in her mother.  Social History:  reports that she has never smoked. She has never used smokeless tobacco. She reports that she does not drink alcohol and does not use drugs.  Current Medications: has a current medication list which includes the following prescription(s): acetaminophen , cholecalciferol, fluoxetine , omeprazole , and atenolol.  Allergies:  Allergies  Allergen Reactions   Penicillin Rash   Penicillins Other (See Comments)   Sulfa (Sulfonamide Antibiotics) Rash    ROS:  A 15 point review of systems was performed and pertinent positives and negatives noted in HPI   Objective:     BP 123/86   Pulse 73   Ht 162.6 cm (5' 4)   Wt (!) 109.3 kg (241 lb)   LMP 06/21/2018 (Exact Date)   BMI 41.37 kg/m   Constitutional :  No distress, cooperative, alert  Lymphatics/Throat:  Supple with no lymphadenopathy  Respiratory:  Clear to auscultation bilaterally  Cardiovascular:  Regular rate and rhythm  Gastrointestinal: Soft, mild LLQ, non-distended, no organomegaly.  Musculoskeletal: Steady gait and movement  Skin: Cool and moist  Psychiatric: Normal affect, non-agitated,  not confused       LABS:  N/a    RADS: CLINICAL DATA:  LLQ abdominal pain   EXAM:  CT ABDOMEN AND PELVIS WITH CONTRAST   TECHNIQUE:  Multidetector CT imaging of the abdomen and pelvis was performed  using the standard protocol following bolus administration of  intravenous contrast.   RADIATION DOSE REDUCTION: This exam was performed according to the  departmental dose-optimization program which includes automated  exposure control, adjustment of the mA and/or kV according to  patient size and/or use of iterative reconstruction technique.   CONTRAST:  OMNIPAQUE  IOHEXOL  300 MG/ML  SOLN   COMPARISON:  December 24, 2022   FINDINGS:  Lower chest: No acute abnormality.   Hepatobiliary: Mild diffuse low-attenuation of the liver. Status  post cholecystectomy. No new extrahepatic biliary ductal dilation.   Pancreas: Unremarkable. No pancreatic ductal dilatation or  surrounding inflammatory changes.   Spleen: Normal in size without focal abnormality.   Adrenals/Urinary Tract: Adrenal glands are unremarkable. Kidneys  enhance symmetrically. No hydronephrosis. No obstructing  nephrolithiasis. Bladder is unremarkable.   Stomach/Bowel: No evidence of bowel obstruction. There are multiple  diverticuli most predominant along the confluence of the sigmoid  colon with the descending colon. At this area, there is  circumferential wall thickening of the colon with adjacent fat  stranding and trace fluid. No focal drainable fluid collection or  free air. Tiny hiatal hernia. Moderate to large colonic stool burden  in the RIGHT and transverse colon. Appendix is normal.   Vascular/Lymphatic: RIGHT external iliac vein stent. Abdominal aorta  is normal in course and caliber.  No suspicious lymphadenopathy.  There are few prominent lymph nodes along the stretch of inflamed  sigmoid colon measuring approximately 4 mm (series 5, image 64, 61).  These are likely reactive.    Reproductive: Status post hysterectomy. No adnexal masses.   Other: No free air.   Musculoskeletal: No acute or significant osseous findings.   IMPRESSION:  1. Acute uncomplicated diverticulitis of the sigmoid colon.  Recommend correlation with colon cancer screening history.  2. Hepatic steatosis.    Electronically Signed    By: Corean Salter M.D.    On: 03/05/2024 09:13   Assessment:      Diverticulitis of large intestine without perforation or abscess without bleeding [K57.32]  Plan:     Discussed pathophisiology.  The risk of laparoscopic colon resection surgery includes, but not limited to, recurrence, bleeding, chronic pain, post-op infxn, post-op SBO or ileus, hernias, resection of bowel, re-anastamosis, possible ostomy placement and need for re-operation to address said risks. The risks of general anesthetic, if used, includes MI, CVA, sudden death or even reaction to anesthetic medications also discussed. Alternatives include continued observation.  Benefits include possible symptom relief.   Typical post-op recovery time of additional days in hospital for observation afterwards also discussed.   Prep ordered.  Will proceed with ERAS protocol as well.       The patient verbalized understanding and all questions were answered to the patient's satisfaction.  labs/images/medications/previous chart entries reviewed personally and relevant changes/updates noted above.

## 2024-04-24 NOTE — Anesthesia Postprocedure Evaluation (Addendum)
"   Anesthesia Post Note  Patient: Kari Rice  Procedure(s) Performed: COLECTOMY, SIGMOID, ROBOT-ASSISTED (Abdomen)  Patient location during evaluation: Phase II Anesthesia Type: General Level of consciousness: awake and awake and alert Pain management: pain level controlled Vital Signs Assessment: post-procedure vital signs reviewed and stable Respiratory status: spontaneous breathing Cardiovascular status: blood pressure returned to baseline Postop Assessment: no apparent nausea or vomiting Anesthetic complications: no   There were no known notable events for this encounter.   Last Vitals:  Vitals:   04/24/24 1255 04/24/24 1300  BP:  114/82  Pulse: 79 79  Resp:  16  Temp:    SpO2: 94% 98%    Last Pain:  Vitals:   04/24/24 1245  TempSrc:   PainSc: 2       Today's Vitals   04/24/24 1245 04/24/24 1250 04/24/24 1255 04/24/24 1300  BP: 116/60   114/82  Pulse: 86 85 79 79  Resp: 16   16  Temp: 36.5 C     TempSrc:      SpO2: 99% 93% 94% 98%  Weight:      Height:      PainSc: 2       Body mass index is 39.14 kg/m.             Eliah Marquard GORMAN Balloon      "

## 2024-04-24 NOTE — Anesthesia Procedure Notes (Addendum)
 Procedure Name: Intubation Date/Time: 04/24/2024 7:43 AM  Performed by: Bonnetta Jimmey SAUNDERS, CRNAPre-anesthesia Checklist: Patient identified, Emergency Drugs available, Suction available and Patient being monitored Patient Re-evaluated:Patient Re-evaluated prior to induction Oxygen Delivery Method: Circle system utilized Preoxygenation: Pre-oxygenation with 100% oxygen Induction Type: IV induction Ventilation: Two handed mask ventilation required Grade View: Grade I Tube type: Oral Tube size: 7.0 mm Number of attempts: 1 Airway Equipment and Method: Stylet and Oral airway Placement Confirmation: ETT inserted through vocal cords under direct vision, positive ETCO2 and breath sounds checked- equal and bilateral Secured at: 20 cm Tube secured with: Tape Dental Injury: Teeth and Oropharynx as per pre-operative assessment

## 2024-04-24 NOTE — Anesthesia Preprocedure Evaluation (Addendum)
"                                    Anesthesia Evaluation  Patient identified by MRN, date of birth, ID band Patient awake    Reviewed: Allergy  & Precautions, H&P , NPO status , Patient's Chart, lab work & pertinent test results  History of Anesthesia Complications (+) PONV and history of anesthetic complications  Airway Mallampati: III  TM Distance: <3 FB Neck ROM: Full    Dental no notable dental hx.    Pulmonary asthma , sleep apnea    Pulmonary exam normal breath sounds clear to auscultation       Cardiovascular negative cardio ROS Normal cardiovascular exam Rhythm:regular Rate:Normal     Neuro/Psych negative neurological ROS  negative psych ROS   GI/Hepatic negative GI ROS, Neg liver ROS,GERD  ,,  Endo/Other  negative endocrine ROS    Renal/GU negative Renal ROS     Musculoskeletal  (+) Arthritis ,    Abdominal  (+) + obese  Peds  Hematology   Anesthesia Other Findings 04/21/23 14:55 Comprehensive metabolic panel with GFR: Rpt ! Sodium: 147 (H) Potassium: 3.3 (L) Chloride: 103 CO2: 28 Glucose: 89 BUN: 7 Creatinine: 0.59 Calcium : 8.9 BUN/Creatinine Ratio: 12 eGFR: 110 Alkaline Phosphatase: 85 Albumin: 4.0 AST: 23 ALT: 37 (H) Total Protein: 6.4 Total Bilirubin: 0.4 Globulin, Total: 2.4   !: Data is abnormal (H): Data is abnormally high (L): Data is abnormally low Rpt: View report in Results Review for more information  Reproductive/Obstetrics negative OB ROS                              Anesthesia Physical Anesthesia Plan  ASA: 3  Anesthesia Plan: General   Post-op Pain Management:    Induction:   PONV Risk Score and Plan: 3  Airway Management Planned: Oral ETT  Additional Equipment:   Intra-op Plan:   Post-operative Plan:   Informed Consent:   Plan Discussed with: CRNA  Anesthesia Plan Comments:          Anesthesia Quick Evaluation  "

## 2024-04-24 NOTE — Op Note (Signed)
 Preoperative diagnosis: Diverticulitis Postoperative diagnosis: Same  Procedure: Robotic assisted laparoscopic sigmoidectomy.   Anesthesia: GETA   Surgeon: Henriette Pierre Assistant: Luane Hong for exposure and bedside assist    Wound Classification: clean contaminated   Specimen: Sigmoid colon   Complications: None   Estimated Blood Loss: 20 mL  Indications: Please see H&P for further details.     FIndings: 1.  Successful EEA anastomosis with no air leak  2.  Normal anatomy 3.  Adequate hemostasis.     Description of procedure: The patient was placed on the operating table in the low lithotomy position, both arms tucked. General anesthesia was induced.  Foley placed. A time-out was completed verifying correct patient, procedure, site, positioning, and implant(s) and/or special equipment prior to beginning this procedure. The abdomen was prepped and draped in the usual sterile fashion.    Veress needle inserted at palmer's point and after two click and saline droptest, insufflation started up to 15mm Hg without issue. Needle removed and 5mm port placed through same spot via optiview technique.  No injuries noted during insertion.  4 additional ports were then placed along the right abdominal wall, one 12mm port in RLQ, 3 8 mm ports.   Inspection of the sigmoid colon, noted adhesions to lateral wall around thickened sigmoid consistent with diverticular disease.. Lateral attachments were taken down using sharp dissection.  This was carried up and proximal to the splenic flexure for further mobilization of the descending colon for anastamosis.  Area proximal to the thickened colon wall was chosen for anvil insertion site.  Vessel sealer then used to transect the mesentery at from this point down to the rectum.   ICG was infused and adequate circulation was noted at the planned staple lines.  60 mm blue load stapler was used to transect proximal end.  Another 60 mm blue load stapler  was then used to transect the colon at the rectum.  Scrubbed back in and A infraumbilical midline incision was made and dissection carried down to fascia.  Fascia incised and abdomen entered.  Wound protector placed and specimen removed from abdomen, pending pathology.  Proximal colon pulled through incision and purse string device used to transect end, and 29mm EEA anvil placed through hole and tied down.  Inspection of the pursestring suture noted a small defect at the lateral edge and this was reinforced with 3-0 silk.  Edges cleaned and placed back in abdominal cavity.  Gel port placed on wound protector and insufflation resumed. EEA stapler placed through the rectal stump and guided to the distal staple line.  Lap instruments used to then connect anvil to stapler and a end to end anastomosis was created, after confirming no twisting of the proximal mesentery and no tension noted on the proximal colon.  Saline was infused into the pelvis, and a air leak test was performed and anastomosis did not show any leaks.  All saline was then suctioned out, anastomosis looked viable, and hemostasis was noted throughout the abdomen.    0vicryl used to close fascial defect at 12mm RLQ port site using Efx shield device   Clean closure protocol initiated.  Infraumbilical midline incision site closed with 1 PDS x2, both poseterior and anterior fascia.  3-0 Vicryl used to approximate the subcutaneous tissue at extraction site prior to closing all skin sites with 4-0 monocryl, then dressing with dermabond.  The patient tolerated the procedure well, awakened from anesthesia and was taken to the postanesthesia care unit in satisfactory condition with  Foley in place.  Sponge count correct at end of procedure.

## 2024-04-24 NOTE — Transfer of Care (Signed)
 Immediate Anesthesia Transfer of Care Note  Patient: Kari Rice  Procedure(s) Performed: COLECTOMY, SIGMOID, ROBOT-ASSISTED (Abdomen)  Patient Location: PACU  Anesthesia Type:General  Level of Consciousness: drowsy  Airway & Oxygen Therapy: Patient Spontanous Breathing and Patient connected to face mask oxygen  Post-op Assessment: Report given to RN and Post -op Vital signs reviewed and stable  Post vital signs: Reviewed and stable  Last Vitals:  Vitals Value Taken Time  BP 102/60 04/24/24 11:16  Temp    Pulse 71 04/24/24 11:21  Resp 21 04/24/24 11:21  SpO2 100 % 04/24/24 11:21  Vitals shown include unfiled device data.  Last Pain:  Vitals:   04/24/24 0656  TempSrc: Temporal  PainSc: 0-No pain      Patients Stated Pain Goal: 0 (04/24/24 9343)  Complications: There were no known notable events for this encounter.

## 2024-04-24 NOTE — TOC CM/SW Note (Signed)
 Transition of Care [facility]) CM/SW Note    Transition of Care [facility]) - Inpatient Brief Assessment   Patient Details  Name: ANEA FODERA MRN: [id number] Date of Birth: [birth year]  Transition of Care [facility]) CM/SW Contact:    Alfonso Rummer, LCSW Phone Number: 04/24/2024, 4:28 PM   Clinical Narrative:  Completed toc chart review. No toc needs identified at this time. Please contact toc should needs arise.    Transition of Care Asessment: Insurance and Status: Insurance coverage has been reviewed Patient has primary care physician: Yes (TOWER, MARNE A) Home environment has been reviewed: single family home   Prior/Current Home Services: No current home services Social Drivers of Health Review: SDOH reviewed no interventions necessary Readmission risk has been reviewed: No Transition of care needs: no transition of care needs at this time

## 2024-04-25 ENCOUNTER — Encounter: Payer: Self-pay | Admitting: Surgery

## 2024-04-25 LAB — BASIC METABOLIC PANEL WITH GFR
Anion gap: 8 (ref 5–15)
BUN: 10 mg/dL (ref 6–20)
CO2: 27 mmol/L (ref 22–32)
Calcium: 9 mg/dL (ref 8.9–10.3)
Chloride: 108 mmol/L (ref 98–111)
Creatinine, Ser: 0.61 mg/dL (ref 0.44–1.00)
GFR, Estimated: >60 mL/min
Glucose, Bld: 101 mg/dL — ABNORMAL HIGH (ref 70–99)
Potassium: 4.1 mmol/L (ref 3.5–5.1)
Sodium: 143 mmol/L (ref 135–145)

## 2024-04-25 LAB — CBC
HCT: 38.3 % (ref 36.0–46.0)
Hemoglobin: 12.7 g/dL (ref 12.0–15.0)
MCH: 29.1 pg (ref 26.0–34.0)
MCHC: 33.2 g/dL (ref 30.0–36.0)
MCV: 87.8 fL (ref 80.0–100.0)
Platelets: 229 K/uL (ref 150–400)
RBC: 4.36 MIL/uL (ref 3.87–5.11)
RDW: 12.5 % (ref 11.5–15.5)
WBC: 15.1 K/uL — ABNORMAL HIGH (ref 4.0–10.5)
nRBC: 0 % (ref 0.0–0.2)

## 2024-04-25 MED ORDER — ENOXAPARIN SODIUM 60 MG/0.6ML IJ SOSY
50.0000 mg | PREFILLED_SYRINGE | INTRAMUSCULAR | Status: DC
  Administered 2024-04-26 – 2024-04-27 (×2): 50 mg via SUBCUTANEOUS
  Filled 2024-04-25 (×2): qty 0.6

## 2024-04-25 NOTE — Progress Notes (Signed)
 [facility]- General Surgery  SURGICAL PROGRESS NOTE  Hospital Day(s): 1.   Post op day(s): 1 Day Post-Op.   Interval History:  Patient seen and examined. No acute events or new complaints overnight.  Patient reports minimal pain. Overall is doing well.  Tolerating clear liquid diet, denies any nausea or vomiting. Has not passed gas or had a bowel movement.   Vital signs in last 24 hours: [min-max] current  Temp:  [97 F (36.1 C)-98 F (36.7 C)] 98 F (36.7 C) (01/13 0247) Pulse Rate:  [69-90] 71 (01/13 0247) Resp:  [10-27] 17 (01/13 0247) BP: (92-116)/(58-82) 108/62 (01/13 0247) SpO2:  [87 %-99 %] 96 % (01/13 0247)     Height: 5' 4 (162.6 cm) Weight: 103.4 kg BMI (Calculated): 39.12   Intake/Output last 2 shifts:  01/12 0701 - 01/13 0700 In: 2629 [P.O.:240; I.V.:2289; IV Piggyback:100] Out: 1880 [Urine:1870; Blood:10]   Physical Exam:  Constitutional: alert, cooperative and no distress  Respiratory: breathing non-labored at rest  Cardiovascular: regular rate and sinus rhythm  Gastrointestinal: soft, non-tender, and non-distended, surgical incisions are clean and dry with surgical glue intact  Labs:     Latest Ref Rng & Units 04/25/2024    5:34 AM 04/24/2024    2:49 PM 03/14/2024   12:53 PM  CBC  WBC 4.0 - 10.5 K/uL 15.1  14.5  9.2   Hemoglobin 12.0 - 15.0 g/dL 87.2  85.7  86.8   Hematocrit 36.0 - 46.0 % 38.3  41.7  41.3   Platelets 150 - 400 K/uL 229  201  279       Latest Ref Rng & Units 04/25/2024    5:34 AM 04/24/2024    2:49 PM 03/14/2024   12:53 PM  CMP  Glucose 70 - 99 mg/dL 898   99   BUN 6 - 20 mg/dL 10   13   Creatinine 9.55 - 1.00 mg/dL 9.38  9.26  9.39   Sodium 135 - 145 mmol/L 143   140   Potassium 3.5 - 5.1 mmol/L 4.1   4.2   Chloride 98 - 111 mmol/L 108   105   CO2 22 - 32 mmol/L 27   24   Calcium  8.9 - 10.3 mg/dL 9.0   9.1   Total Protein 6.0 - 8.5 g/dL   6.2   Total Bilirubin 0.0 - 1.2 mg/dL   0.2   Alkaline Phos 41 - 116 IU/L   80   AST 0  - 40 IU/L   24   ALT 0 - 32 IU/L   26     Imaging studies: No new pertinent imaging studies   Assessment/Plan:  49 y.o. female with diverticulitis 1 Day Post-Op s/p robotic assisted laparoscopic sigmoidectomy, complicated by pertinent comorbidities including GERD and obesity.   - Stable vital signs, nontachycardic  - Slight increase in leukocytosis likely from surgery.  Will continue to monitor  - Continue clear liquid diet.  Plan to advance once patient has return of GI function  - Discontinue Foley catheter  - Encouraged to ambulate today  - Continue pain management DVT prophylaxis with Lovenox   -- Obryan Radu Barrientos PA-C

## 2024-04-25 NOTE — Progress Notes (Signed)
 Foley catheter removed, per MD order. Orders followed, patient tolerated well.

## 2024-04-25 NOTE — Progress Notes (Signed)
 PHARMACIST - PHYSICIAN COMMUNICATION  CONCERNING:  Enoxaparin  (Lovenox ) for DVT Prophylaxis   ASSESSMENT: Patient was prescribed enoxaparin  40 mg subcutaneously every 24 hours for VTE prophylaxis.   Body mass index is 39.14 kg/m.  Estimated Creatinine Clearance: 97.5 mL/min (by C-G formula based on SCr of 0.61 mg/dL).  Based on [facility] policy, patient qualifies for enoxaparin  dosing of 0.5 mg per kilogram of total body weight every 24 hours because their body mass index is >30 kg/m2.  PLAN: Pharmacy has adjusted enoxaparin  dose per [facility] policy.  Description: Patient is now receiving enoxaparin  50 mg subcutaneously every 24 hours.  Will M. Lenon, PharmD, BCPS Clinical Pharmacist 04/25/2024 2:11 PM

## 2024-04-26 LAB — CBC
HCT: 38.5 % (ref 36.0–46.0)
Hemoglobin: 12.8 g/dL (ref 12.0–15.0)
MCH: 29 pg (ref 26.0–34.0)
MCHC: 33.2 g/dL (ref 30.0–36.0)
MCV: 87.3 fL (ref 80.0–100.0)
Platelets: 199 K/uL (ref 150–400)
RBC: 4.41 MIL/uL (ref 3.87–5.11)
RDW: 12.8 % (ref 11.5–15.5)
WBC: 9.6 K/uL (ref 4.0–10.5)
nRBC: 0 % (ref 0.0–0.2)

## 2024-04-26 LAB — BASIC METABOLIC PANEL WITH GFR
Anion gap: 6 (ref 5–15)
BUN: 10 mg/dL (ref 6–20)
CO2: 29 mmol/L (ref 22–32)
Calcium: 9 mg/dL (ref 8.9–10.3)
Chloride: 107 mmol/L (ref 98–111)
Creatinine, Ser: 0.7 mg/dL (ref 0.44–1.00)
GFR, Estimated: >60 mL/min
Glucose, Bld: 91 mg/dL (ref 70–99)
Potassium: 4.4 mmol/L (ref 3.5–5.1)
Sodium: 142 mmol/L (ref 135–145)

## 2024-04-26 LAB — SURGICAL PATHOLOGY

## 2024-04-26 NOTE — Progress Notes (Signed)
 [facility]- General Surgery  SURGICAL PROGRESS NOTE  Hospital Day(s): 2.   Post op day(s): 2 Days Post-Op.   Interval History:  Patient is recovering well. Abdominal pain has remained minimal. Reports tolerating full liquid diet. States she's had multiple bowel movements and has passed gas. No other complaints reported.   Vital signs in last 24 hours: [min-max] current  Temp:  [97.9 F (36.6 C)-98.8 F (37.1 C)] 97.9 F (36.6 C) (01/14 0400) Pulse Rate:  [64-71] 64 (01/14 0400) Resp:  [16-18] 18 (01/14 0400) BP: (98-114)/(61-78) 114/78 (01/14 0400) SpO2:  [95 %-97 %] 97 % (01/14 0400)     Height: 5' 4 (162.6 cm) Weight: 103.4 kg BMI (Calculated): 39.12   Intake/Output last 2 shifts:  01/13 0701 - 01/14 0700 In: 1480 [P.O.:1480] Out: 300 [Urine:300]   Physical Exam:  Constitutional: alert, cooperative and no distress  Respiratory: breathing non-labored at rest  Cardiovascular: regular rate and sinus rhythm  Gastrointestinal: soft, mildly tender, and non-distended, surgical incisions are clean and dry   Labs:     Latest Ref Rng & Units 04/26/2024    5:17 AM 04/25/2024    5:34 AM 04/24/2024    2:49 PM  CBC  WBC 4.0 - 10.5 K/uL 9.6  15.1  14.5   Hemoglobin 12.0 - 15.0 g/dL 87.1  87.2  85.7   Hematocrit 36.0 - 46.0 % 38.5  38.3  41.7   Platelets 150 - 400 K/uL 199  229  201       Latest Ref Rng & Units 04/26/2024    5:17 AM 04/25/2024    5:34 AM 04/24/2024    2:49 PM  CMP  Glucose 70 - 99 mg/dL 91  898    BUN 6 - 20 mg/dL 10  10    Creatinine 9.55 - 1.00 mg/dL 9.29  9.38  9.26   Sodium 135 - 145 mmol/L 142  143    Potassium 3.5 - 5.1 mmol/L 4.4  4.1    Chloride 98 - 111 mmol/L 107  108    CO2 22 - 32 mmol/L 29  27    Calcium  8.9 - 10.3 mg/dL 9.0  9.0      Imaging studies: No new pertinent imaging studies   Assessment/Plan:  49 y.o. female with diverticulitis  2 Days Post-Op s/p robotic assisted laparoscopic sigmoidectomy, complicated by pertinent  comorbidities including GERD and obesity.    - Presenting with stable vital signs, leukocytosis has resolved    - Recovering well, has had multiple bowel movements/flatus and has tolerated full liquid diet   - Plan to advance to regular diet for lunch   - Possible discharge tomorrow if patient tolerates regular diet and continues to have GI function - Encouraged to continue to ambulate    -- [employer] PA-C

## 2024-04-26 NOTE — Plan of Care (Signed)
" °  Problem: Education: Goal: Understanding of discharge needs will improve Outcome: Progressing Goal: Verbalization of understanding of the causes of altered bowel function will improve Outcome: Progressing   Problem: Activity: Goal: Ability to tolerate increased activity will improve Outcome: Progressing   Problem: Bowel/Gastric: Goal: Gastrointestinal status for postoperative course will improve Outcome: Progressing   Problem: Health Behavior/Discharge Planning: Goal: Identification of community resources to assist with postoperative recovery needs will improve Outcome: Progressing   Problem: Nutritional: Goal: Will attain and maintain optimal nutritional status will improve Outcome: Progressing   Problem: Clinical Measurements: Goal: Postoperative complications will be avoided or minimized Outcome: Progressing   Problem: Respiratory: Goal: Respiratory status will improve Outcome: Progressing   Problem: Skin Integrity: Goal: Will show signs of wound healing Outcome: Progressing   Problem: Education: Goal: Knowledge of the prescribed therapeutic regimen will improve Outcome: Progressing   Problem: Bowel/Gastric: Goal: Gastrointestinal status for postoperative course will improve Outcome: Progressing   Problem: Cardiac: Goal: Ability to maintain an adequate cardiac output Outcome: Progressing Goal: Will show no evidence of cardiac arrhythmias Outcome: Progressing   Problem: Nutritional: Goal: Will attain and maintain optimal nutritional status Outcome: Progressing   Problem: Neurological: Goal: Will regain or maintain usual level of consciousness Outcome: Progressing   Problem: Clinical Measurements: Goal: Ability to maintain clinical measurements within normal limits Outcome: Progressing Goal: Postoperative complications will be avoided or minimized Outcome: Progressing   Problem: Respiratory: Goal: Will regain and/or maintain adequate  ventilation Outcome: Progressing Goal: Respiratory status will improve Outcome: Progressing   Problem: Skin Integrity: Goal: Demonstrates signs of wound healing without infection Outcome: Progressing   Problem: Urinary Elimination: Goal: Will remain free from infection Outcome: Progressing Goal: Ability to achieve and maintain adequate urine output Outcome: Progressing   Problem: Education: Goal: Knowledge of General Education information will improve Description: Including pain rating scale, medication(s)/side effects and non-pharmacologic comfort measures Outcome: Progressing   Problem: Health Behavior/Discharge Planning: Goal: Ability to manage health-related needs will improve Outcome: Progressing   Problem: Clinical Measurements: Goal: Ability to maintain clinical measurements within normal limits will improve Outcome: Progressing Goal: Will remain free from infection Outcome: Progressing Goal: Diagnostic test results will improve Outcome: Progressing Goal: Respiratory complications will improve Outcome: Progressing Goal: Cardiovascular complication will be avoided Outcome: Progressing   Problem: Activity: Goal: Risk for activity intolerance will decrease Outcome: Progressing   Problem: Nutrition: Goal: Adequate nutrition will be maintained Outcome: Progressing   Problem: Coping: Goal: Level of anxiety will decrease Outcome: Progressing   Problem: Elimination: Goal: Will not experience complications related to bowel motility Outcome: Progressing Goal: Will not experience complications related to urinary retention Outcome: Progressing   Problem: Pain Managment: Goal: General experience of comfort will improve and/or be controlled Outcome: Progressing   Problem: Safety: Goal: Ability to remain free from injury will improve Outcome: Progressing   Problem: Skin Integrity: Goal: Risk for impaired skin integrity will decrease Outcome: Progressing   "

## 2024-04-27 ENCOUNTER — Other Ambulatory Visit: Payer: Self-pay

## 2024-04-27 MED ORDER — HYDROCODONE-ACETAMINOPHEN 5-325 MG PO TABS
1.0000 | ORAL_TABLET | Freq: Three times a day (TID) | ORAL | 0 refills | Status: AC | PRN
  Filled 2024-04-27: qty 10, 4d supply, fill #0

## 2024-04-27 NOTE — Discharge Summary (Signed)
 [facility]-General Surgery  SURGICAL DISCHARGE SUMMARY  Patient ID: Kari Rice MRN: [id number] DOB/AGE: Jul 30, 1975 49 y.o.  Admit date: 04/24/2024 Discharge date: 04/27/2024  Discharge Diagnoses Patient Active Problem List   Diagnosis Date Noted   Diverticulitis 03/14/2024   Puncture wound of foot excluding toes without complication, right, sequela 01/24/2024   Vaginal dryness 01/24/2024   Abnormal thyroid blood test 09/23/2023   Menopausal symptoms 09/22/2023   Fatigue 09/22/2023   Current use of proton pump inhibitor 05/26/2023   Vitamin B12 deficiency 05/26/2023   Obesity (BMI 30-39.9) 12/16/2021   OSA (obstructive sleep apnea) 12/03/2021   Sigmoid diverticulosis    Colon cancer screening 04/18/2021   Dysthymia 12/26/2019   Right leg swelling 12/11/2019   Fungal nail infection 10/11/2019   Patellofemoral stress syndrome 07/20/2018   Injury of left toe 01/25/2018   GERD (gastroesophageal reflux disease) 11/22/2017   H/O vaginitis 08/10/2017   Vitamin D  deficiency 01/15/2016   Routine general medical examination at a health care facility 05/29/2014   Encounter for screening mammogram for breast cancer 05/29/2014   Joint pain 04/10/2014   History of DVT (deep vein thrombosis) 07/06/2012   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  She was treated with outpatient antibiotics and followed up with Dr. Tye on 03/20/2024.  Patient was scheduled for a robotic assisted laparoscopic sigmoidectomy for diverticulitis of  large intestine without perforation or abscess. Patient was taken to the operating room on 04/24/24 for robotic assisted laparoscopic sigmoidectomy. Surgery went well. Patient tolerated procedure.  Patient was admitted for postop monitoring and pain management.  Patient was started on clear liquid diet and was slowly advanced to regular diet.  Status post day 2, patient reported multiple bowel movements and flatus.  She was advanced to regular diet then. Leukocytosis resolved.  Patient is now status postop day 3 recovering well, tolerating regular diet and ambulating with minimal pain. Surgical incisions show no signs of infection. Abdominal pain has remained minimal throughout hospital course. Patient is clear from surgical standpoint.  Patient will follow-up outpatient with Dr. Tye in 2 weeks.     Physical Examination:  Constitutional: alert, in no acute distress Pulmonary: CTA bilaterally, normal breath sounds Cardiac: regular rate and rhythm Gastrointestinal: soft, generalized mild tenderness, and non-distended Skin: abdominal incisions look clean, dry, and intact with surgical glue in place, mild bruising noted surrounding incisions. No obvious hematoma   Allergies as of 04/27/2024       Reactions   Penicillins Rash   Sulfa Antibiotics Rash        Medication List     TAKE these medications    acetaminophen  650 MG CR tablet Commonly known as: TYLENOL  Take 1,300 mg by mouth every 8 (eight) hours as needed for pain.   albuterol  108 (90 Base) MCG/ACT inhaler Commonly known as: VENTOLIN  HFA Inhale 2 puffs into the lungs every 6 (six) hours as needed for wheezing or shortness of breath.   budesonide -formoterol  160-4.5 MCG/ACT inhaler Commonly known as: Symbicort  Inhale 2 puffs into the lungs 2 (two) times daily.  estradiol  0.01 % Crea vaginal cream Commonly known as: ESTRACE  Insert pea sized amount of cream to vagina twice weekly   FLUoxetine  20 MG capsule Commonly  known as: PROZAC  TAKE 1 CAPSULE(20 MG) BY MOUTH DAILY   HYDROcodone -acetaminophen  5-325 MG tablet Commonly known as: NORCO/VICODIN Take 1 tablet by mouth every 8 (eight) hours as needed for up to 3 days.   NON FORMULARY Support hose to the waist for dx of venous insufficiency and edema 15-20 mm Hg   omeprazole  20 MG capsule Commonly known as: PRILOSEC Take 20 mg by mouth daily.   Vitamin D3 50 MCG (2000 UT) Tabs Take 2,000 Units by mouth daily.          Follow-up Information     Tye, Isami, DO Follow up in 2 week(s).   Specialties: General Surgery, Surgery Why: 2 weeks s/p sigmoidectomy Contact information: [address] [place] [address] [phone]                  Time spent on discharge management including discussion of hospital course, clinical condition, outpatient instructions, prescriptions, and follow up with the patient and members of the medical team: >30 minutes  Latise Dilley Barrientos PA-C

## 2024-04-27 NOTE — Discharge Instructions (Signed)

## 2024-05-03 ENCOUNTER — Ambulatory Visit: Admitting: Internal Medicine

## 2024-05-03 ENCOUNTER — Encounter: Payer: Self-pay | Admitting: Internal Medicine

## 2024-05-03 VITALS — BP 120/80 | HR 77 | Temp 98.9°F | Ht 64.0 in | Wt 222.0 lb

## 2024-05-03 DIAGNOSIS — J4540 Moderate persistent asthma, uncomplicated: Secondary | ICD-10-CM

## 2024-05-03 DIAGNOSIS — U099 Post covid-19 condition, unspecified: Secondary | ICD-10-CM

## 2024-05-03 DIAGNOSIS — K219 Gastro-esophageal reflux disease without esophagitis: Secondary | ICD-10-CM

## 2024-05-03 DIAGNOSIS — E669 Obesity, unspecified: Secondary | ICD-10-CM | POA: Diagnosis not present

## 2024-05-03 DIAGNOSIS — G4733 Obstructive sleep apnea (adult) (pediatric): Secondary | ICD-10-CM | POA: Diagnosis not present

## 2024-05-03 DIAGNOSIS — Z6838 Body mass index (BMI) 38.0-38.9, adult: Secondary | ICD-10-CM | POA: Diagnosis not present

## 2024-05-03 NOTE — Patient Instructions (Addendum)
 Plan to retest with home sleep study in the next 6 months Continue on weight loss journey  Avoid Allergens and Irritants Avoid secondhand smoke Avoid SICK contacts Recommend  Masking  when appropriate Recommend Keep up-to-date with vaccinations  Symbicort  2 puffs at night Rinse mouth after use Continue CPAP as prescribed Can use albuterol  as needed

## 2024-05-03 NOTE — Progress Notes (Signed)
 "  @Patient  ID: Kari Rice, female    DOB: 1975/09/21, 49 y.o.   MRN: [id number]   TEST/EVENTS :  Home sleep study December 30, 2015 AHI 12.5    SYNOPSIS 49 year old female followed for obstructive sleep apnea,reestablish for sleep apnea, and ASTHMA.  Patient was previously diagnosed with sleep apnea in 2017 with a home sleep study that showed mild sleep apnea with AHI of 12.5's/hour.   Caffeine intake is usually 2 cups of coffee daily.  No symptoms suspicious for cataplexy or sleep paralysis.   She uses a nasal mask.  Gets her supplies and CPAP from [employer].  She does not use any sleep aids. Medical history significant for Bell's palsy, DVT, GERD, Depression  Surgical history significant for cholecystectomy, nasal septoplasty, hysterectomy Social history patient lives with her mother.  She works in clinical biochemist at [employer].  She has no children.  She is divorced.  She is a never smoker.  No alcohol or drug use. Family history positive for heart disease, rheumatoid arthritis and cancer.     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  Encouraged proper weight management.  Discussed sleep hygiene Patient has stopped using her CPAP Patient stated is more bothersome She is using Breathe Right strips and had significant weight loss She claims she lost approximately 15 pounds and continues to lose weight Patient with sigmoidectomy for diverticulitis last Monday Patient  does not want to use her CPAP anymore-it is impeding her sleep    Allergies  Allergen Reactions   Penicillins Rash   Sulfa Antibiotics Rash    Immunization History  Administered Date(s) Administered   Influenza, Seasonal, Injecte, Preservative Fre 07/24/2023, 01/24/2024   Influenza,inj,Quad PF,6+ Mos 04/30/2014, 01/14/2016, 01/11/2018, 12/05/2018, 12/26/2019, 04/18/2021   Influenza-Unspecified 02/12/2023   PFIZER(Purple Top)SARS-COV-2 Vaccination 06/12/2019, 07/03/2019, 04/01/2020   Pfizer(Comirnaty)Fall Seasonal Vaccine 12 years and older 07/24/2023   Td 04/02/2008, 10/07/2022   Tdap 08/10/2017   Zoster Recombinant(Shingrix) 07/24/2023    Past Medical History:  Diagnosis Date   Anemia 08/21   Heavy period due to fibroids   Arthritis    right hand   Asthma    Depression 01/2020   Attended online therapy   Diverticulitis of large intestine without perforation or abscess without bleeding    DVT (deep venous thrombosis) (HCC) 2013   GERD (gastroesophageal reflux disease)    Oxygen deficiency    PONV (postoperative nausea and vomiting)    after cholecystectomy   Sleep apnea    CPAP   Varicose veins     Tobacco History: Social History   Tobacco Use  Smoking Status Never   Passive exposure: Past  Smokeless Tobacco Never   Counseling given: Not Answered   Outpatient Medications Prior to Visit  Medication Sig Dispense Refill   acetaminophen  (TYLENOL ) 650 MG CR tablet Take 1,300 mg by mouth every 8 (eight) hours as needed for pain.     albuterol  (VENTOLIN  HFA) 108 (90 Base) MCG/ACT inhaler Inhale 2 puffs into  the lungs every 6 (six) hours as needed for wheezing or shortness of breath. (Patient not taking: Reported on 04/10/2024) 8 g 0   budesonide -formoterol  (SYMBICORT ) 160-4.5 MCG/ACT inhaler Inhale 2 puffs into the lungs 2 (two) times daily. 1 each 12   Cholecalciferol (VITAMIN D3) 2000 units TABS Take 2,000 Units by mouth daily.     estradiol  (ESTRACE ) 0.01 % CREA  vaginal cream Insert pea sized amount of cream to vagina twice weekly (Patient not taking: Reported on 04/10/2024) 42.5 g 1   FLUoxetine  (PROZAC ) 20 MG capsule TAKE 1 CAPSULE(20 MG) BY MOUTH DAILY 90 capsule 1   NON FORMULARY Support hose to the waist for dx of venous insufficiency and edema 15-20 mm Hg     omeprazole  (PRILOSEC) 20 MG capsule Take 20 mg by mouth daily.     No facility-administered medications prior to visit.   LMP  (LMP Unknown)    BP 120/80   Pulse 77   Temp 98.9 F (37.2 C)   Ht 5' 4 (1.626 m)   Wt 222 lb (100.7 kg)   LMP  (LMP Unknown)   SpO2 95%   BMI 38.11 kg/m     Physical Examination:  General Appearance: No distress  EYES EOM intact.   NECK Supple, No JVD Pulmonary: normal breath sounds, No wheezing.  CardiovascularNormal S1,S2.  No m/r/g.   Ext pulses intact, cap refill intact  ALL OTHER ROS ARE NEGATIVE     CBC    Component Value Date/Time   WBC 9.6 04/26/2024 0517   RBC 4.41 04/26/2024 0517   HGB 12.8 04/26/2024 0517   HGB 13.1 03/14/2024 1253   HCT 38.5 04/26/2024 0517   HCT 41.3 03/14/2024 1253   PLT 199 04/26/2024 0517   PLT 279 03/14/2024 1253   MCV 87.3 04/26/2024 0517   MCV 90 03/14/2024 1253   MCH 29.0 04/26/2024 0517   MCHC 33.2 04/26/2024 0517   RDW 12.8 04/26/2024 0517   RDW 13.1 03/14/2024 1253   LYMPHSABS 3.8 (H) 03/14/2024 1253   MONOABS 0.6 08/23/2020 1622   EOSABS 0.5 (H) 03/14/2024 1253   BASOSABS 0.1 03/14/2024 1253   IgE levels 18     ASSESSMENT AND PLAN 49 year old pleasant white female seen today for follow-up assessment for sleep apnea with previous AHI of 12.5 with a previous diagnosis of COVID-19 infection several times over the last several years with a history of reactive airways disease ongoing cough and bronchospasms with uncontrolled GERD  Findings are consistent with mild to moderate persistent asthma Well-controlled at this time   Assessment of OSA Previous AHI 12.5 Patient will  discontinue CPAP as her symptoms have significantly improved based on losing weight We will plan to reassess sleep apnea at next office visit with home sleep test  No evidence of acute heart failure at this time No respiratory distress No fevers, chills, nausea, vomiting, diarrhea No evidence hemoptysis  Findings c/w ASTHMA moderate persistent post COVID infection Continue Symbicort  as prescribed Allergy  panel is negative IgE levels are normal Avoid Allergens and Irritants Avoid secondhand smoke Avoid SICK contacts Recommend  Masking  when appropriate Recommend Keep up-to-date with vaccinations  Obesity -recommend significant weight loss -recommend changing diet  Deconditioned state -Recommend increased daily activity and exercise    MEDICATION ADJUSTMENTS/LABS AND TESTS ORDERED: Symbicort  2 puffs at night Rinse mouth after use Can use albuterol  as needed     CURRENT MEDICATIONS REVIEWED AT LENGTH WITH PATIENT TODAY   Patient  satisfied with Plan  of action and management. All questions answered   Follow up 6 months   I spent a total of 44 minutes dedicated to the care of this patient on the date of this encounter to include pre-visit review of records, face-to-face time with the patient discussing conditions above, post visit ordering of testing, clinical documentation with the electronic health record, making appropriate referrals as documented, and communicating necessary information to the patient's healthcare team.    The Patient requires high complexity decision making for assessment and support, frequent evaluation and titration of therapies, application of advanced monitoring technologies and extensive interpretation of multiple databases.  Patient satisfied with Plan of action and management. All questions answered    Nickolas Alm Cellar, M.D.  [facility]  Medical Director [facility] [place]        "

## 2024-05-04 NOTE — Addendum Note (Signed)
 Addended by: ISAIAH LENIS on: 05/04/2024 12:33 PM   Modules accepted: Level of Service

## 2024-05-10 ENCOUNTER — Inpatient Hospital Stay: Admitting: Family Medicine

## 2024-05-12 ENCOUNTER — Ambulatory Visit: Admitting: Family Medicine

## 2024-05-12 ENCOUNTER — Encounter: Payer: Self-pay | Admitting: Family Medicine

## 2024-05-12 VITALS — BP 122/78 | HR 77 | Temp 97.7°F | Ht 64.0 in | Wt 221.4 lb

## 2024-05-12 DIAGNOSIS — K5730 Diverticulosis of large intestine without perforation or abscess without bleeding: Secondary | ICD-10-CM | POA: Diagnosis not present

## 2024-05-12 DIAGNOSIS — F341 Dysthymic disorder: Secondary | ICD-10-CM | POA: Diagnosis not present

## 2024-05-12 DIAGNOSIS — G4733 Obstructive sleep apnea (adult) (pediatric): Secondary | ICD-10-CM | POA: Diagnosis not present

## 2024-05-12 DIAGNOSIS — K760 Fatty (change of) liver, not elsewhere classified: Secondary | ICD-10-CM | POA: Insufficient documentation

## 2024-05-12 MED ORDER — FLUOXETINE HCL 20 MG PO CAPS: 20.0000 mg | ORAL_CAPSULE | Freq: Every day | ORAL | 2 refills | Status: AC

## 2024-05-12 NOTE — Progress Notes (Signed)
 "  Subjective:    Patient ID: Kari Rice, female    DOB: July 12, 1975, 49 y.o.   MRN: [id number]  HPI  Wt Readings from Last 3 Encounters:  05/12/24 221 lb 6 oz (100.4 kg)  05/03/24 222 lb (100.7 kg)  04/24/24 228 lb (103.4 kg)   38.00 kg/m  Vitals:   05/12/24 1158  BP: 122/78  Pulse: 77  Temp: 97.7 F (36.5 C)  SpO2: 98%    Patient presents for follow-up after hospitalization surgery for diverticulosis. Refill fluoxetine  Obesity with fatty liver and OSA   Surgery went well- 04/24/24 signmoid colectomy, robotic assisted  Recovery has been going fairly well  One incision is still sore /pulling  Hard with recent storms / shoveling snow etc   Other than incision pain  No other pain  Bms are soft -but diet is also different  Eating smaller portions /not quite bland     Was cleared to eat what she wants   Was told that she had infection again at surgical time  No perforation   Limited lifting for 4 weeks  Is waiting to hear when she can return to work  Walking makes her a little sore   Feels ok  No fever   Is working on weight loss  Less added sugar  Is trying monk fruit sweetener   Was able to stop cpap after weight loss  Doing ok without it (was giving her sinus problems)   Fatty liver Lab Results  Component Value Date   ALT 26 03/14/2024   AST 24 03/14/2024   ALKPHOS 80 03/14/2024   BILITOT 0.2 03/14/2024     Fibrosis 4 Score = 1.21 (Low risk)        Interpretation for patients with NAFLD          <1.30       -  F0-F1 (Low risk)          1.30-2.67 -  Indeterminate           >2.67      -  F3-F4 (High risk)     Validated for ages 28-65          Patient Active Problem List   Diagnosis Date Noted   Diverticulitis 03/14/2024   Puncture wound of foot excluding toes without complication, right, sequela 01/24/2024   Vaginal dryness 01/24/2024   Abnormal thyroid blood test 09/23/2023   Menopausal symptoms 09/22/2023   Fatigue 09/22/2023    Current use of proton pump inhibitor 05/26/2023   Vitamin B12 deficiency 05/26/2023   Obesity (BMI 30-39.9) 12/16/2021   OSA (obstructive sleep apnea) 12/03/2021   Sigmoid diverticulosis    Colon cancer screening 04/18/2021   Dysthymia 12/26/2019   Right leg swelling 12/11/2019   Fungal nail infection 10/11/2019   Patellofemoral stress syndrome 07/20/2018   Injury of left toe 01/25/2018   GERD (gastroesophageal reflux disease) 11/22/2017   H/O vaginitis 08/10/2017   Vitamin D  deficiency 01/15/2016   Routine general medical examination at a health care facility 05/29/2014   Encounter for screening mammogram for breast cancer 05/29/2014   Joint pain 04/10/2014   History of DVT (deep vein thrombosis) 07/06/2012   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 07/06/2012   Venous (peripheral) insufficiency 12/23/2011   Phlebitis and thrombophlebitis of the leg 11/06/2011   Venous insufficiency, peripheral 10/26/2011   Pedal edema 10/26/2011   Neck pain 10/21/2009   Past Medical History:  Diagnosis Date   Anemia  08/21   Heavy period due to fibroids   Arthritis    right hand   Asthma    Depression 01/2020   Attended online therapy   Diverticulitis of large intestine without perforation or abscess without bleeding    DVT (deep venous thrombosis) (HCC) 2013   GERD (gastroesophageal reflux disease)    Oxygen deficiency    PONV (postoperative nausea and vomiting)    after cholecystectomy   Sleep apnea    CPAP   Varicose veins    Past Surgical History:  Procedure Laterality Date   ABDOMINAL HYSTERECTOMY     CHOLECYSTECTOMY  Feb. 2006   Gall Bladder   COLECTOMY, SIGMOID, ROBOT-ASSISTED N/A 04/24/2024   Procedure: COLECTOMY, SIGMOID, ROBOT-ASSISTED;  Surgeon: Tye Millet, DO;  Location: [facility];  Service: General;  Laterality: N/A;   COLON SURGERY     COLONOSCOPY WITH PROPOFOL  N/A 05/23/2021   Procedure: COLONOSCOPY WITH PROPOFOL ;  Surgeon: Unk Corinn Skiff, MD;  Location: [facility];  Service: Gastroenterology;  Laterality: N/A;   HAND SURGERY  2016   NASAL SEPTOPLASTY W/ TURBINOPLASTY Bilateral 10/01/2017   Procedure: NASAL SEPTOPLASTY WITH SUBMUCOCELE RESECTION OF TURBINATE;  Surgeon: Herminio Miu, MD;  Location: [facility];  Service: ENT;  Laterality: Bilateral;  Sleep apnea   ROBOTIC ASSISTED LAPAROSCOPIC HYSTERECTOMY AND SALPINGECTOMY Bilateral 03/13/2020   Procedure: XI ROBOTIC ASSISTED LAPAROSCOPIC HYSTERECTOMY AND SALPINGECTOMY;  Surgeon: Victor Claudell SAUNDERS, MD;  Location: [facility];  Service: Gynecology;  Laterality: Bilateral;   THROMBECTOMY Right 01/13/12 and  01/21/12   Right iliac vein stent and Vein thrombosis   Social History[1] Family History  Problem Relation Age of Onset   Arthritis Mother    Cancer Mother        uterine    Heart disease Father        Heart Disease before age 19   Cancer Father        skin    Hypertension Father    Heart attack Father    COPD Father    Obesity Maternal Grandmother    Allergies[2] Medications Ordered Prior to Encounter[3]  Review of Systems  Constitutional:  Positive for fatigue. Negative for activity change, appetite change, fever and unexpected weight change.  HENT:  Negative for congestion, ear pain, rhinorrhea, sinus pressure and sore throat.   Eyes:  Negative for pain, redness and visual disturbance.  Respiratory:  Negative for cough, shortness of breath and wheezing.   Cardiovascular:  Negative for chest pain and palpitations.  Gastrointestinal:  Negative for abdominal distention, abdominal pain, blood in stool, constipation, diarrhea, nausea, rectal pain and vomiting.       Mild incision pain noted Abd pain is gone  Endocrine: Negative for polydipsia and polyuria.  Genitourinary:  Negative for dysuria, frequency and urgency.  Musculoskeletal:  Negative for arthralgias, back pain and myalgias.  Skin:  Negative for pallor and rash.   Allergic/Immunologic: Negative for environmental allergies.  Neurological:  Negative for dizziness, syncope and headaches.  Hematological:  Negative for adenopathy. Does not bruise/bleed easily.  Psychiatric/Behavioral:  Negative for decreased concentration and dysphoric mood. The patient is not nervous/anxious.        Objective:   Physical Exam Constitutional:      General: She is not in acute distress.    Appearance: Normal appearance. She is well-developed. She is obese. She is not ill-appearing or diaphoretic.  HENT:     Head: Normocephalic and atraumatic.  Eyes:     Conjunctiva/sclera: Conjunctivae normal.  Pupils: Pupils are equal, round, and reactive to light.  Neck:     Thyroid: No thyromegaly.     Vascular: No carotid bruit or JVD.  Cardiovascular:     Rate and Rhythm: Normal rate and regular rhythm.     Heart sounds: Normal heart sounds.     No gallop.  Pulmonary:     Effort: Pulmonary effort is normal. No respiratory distress.     Breath sounds: Normal breath sounds. No wheezing or rales.  Abdominal:     General: Abdomen is protuberant. Bowel sounds are normal. There is no distension or abdominal bruit.     Palpations: Abdomen is soft. There is no shifting dullness, fluid wave, hepatomegaly, splenomegaly, mass or pulsatile mass.     Tenderness: There is no abdominal tenderness.     Comments: Laparoscopy scars are healed Largest one in LLQ is sensitive but no swelling or erythema noted    Musculoskeletal:     Cervical back: Normal range of motion and neck supple.     Right Rice leg: No edema.     Left Rice leg: No edema.  Lymphadenopathy:     Cervical: No cervical adenopathy.  Skin:    General: Skin is warm and dry.     Coloration: Skin is not pale.     Findings: No rash.  Neurological:     Mental Status: She is alert.     Motor: No weakness.     Coordination: Coordination normal.     Deep Tendon Reflexes: Reflexes are normal and symmetric. Reflexes  normal.  Psychiatric:        Mood and Affect: Mood normal.     Comments: Good mood           Assessment & Plan:   Assessment & Plan Sigmoid diverticulosis Did very well with sigmoid colectomy on 1/12  Making a good recovery Biggest incision is still sore but improving / seeking clothing with more support Looking forward to exercise when released   Has advanced diet and tolerating that     Fatty liver Last liver tests/normal Lab Results  Component Value Date   ALT 26 03/14/2024   AST 24 03/14/2024   ALKPHOS 80 03/14/2024   BILITOT 0.2 03/14/2024    Fibrosis 4 Score = 1.21 (Low risk)        Interpretation for patients with NAFLD          <1.30       -  F0-F1 (Low risk)          1.30-2.67 -  Indeterminate           >2.67      -  F3-F4 (High risk)     Validated for ages 2-65        Pt plans to work on weight loss  Discussed glp-1 as option later if not successful     OSA (obstructive sleep apnea) With weight loss so far has done ok w/o cpap  Continues follow up with pulmonary / plans another sleep study     Dysthymia Continues to do well with fluoxetine  20 mg daily   Plans to get back to exercise soon when released by surgeon      Morbid obesity (HCC) Bmi of 38  Co morbidities of OSA and fatty liver   Discussed how this problem influences overall health and the risks it imposes  Reviewed plan for weight loss with Rice calorie diet (via better food choices (Rice glycemic and portion  control) along with exercise building up to or more than 30 minutes 5 days per week including some aerobic activity and strength training   Portions are smaller  Waiting to get released to exercise   Discussed consideration of glp-1 in future if indicated           [1]  Social History Tobacco Use   Smoking status: Never    Passive exposure: Past   Smokeless tobacco: Never  Vaping Use   Vaping status: Never Used  Substance Use Topics   Alcohol use: Not  Currently   Drug use: Never  [2]  Allergies Allergen Reactions   Penicillins Rash   Sulfa Antibiotics Rash  [3]  Current Outpatient Medications on File Prior to Visit  Medication Sig Dispense Refill   acetaminophen  (TYLENOL ) 650 MG CR tablet Take 1,300 mg by mouth every 8 (eight) hours as needed for pain.     albuterol  (VENTOLIN  HFA) 108 (90 Base) MCG/ACT inhaler Inhale 2 puffs into the lungs every 6 (six) hours as needed for wheezing or shortness of breath. 8 g 0   budesonide -formoterol  (SYMBICORT ) 160-4.5 MCG/ACT inhaler Inhale 2 puffs into the lungs 2 (two) times daily. 1 each 12   Cholecalciferol (VITAMIN D3) 2000 units TABS Take 2,000 Units by mouth daily.     estradiol  (ESTRACE ) 0.01 % CREA vaginal cream Insert pea sized amount of cream to vagina twice weekly 42.5 g 1   FLUoxetine  (PROZAC ) 20 MG capsule TAKE 1 CAPSULE(20 MG) BY MOUTH DAILY 90 capsule 1   NON FORMULARY Support hose to the waist for dx of venous insufficiency and edema 15-20 mm Hg     omeprazole  (PRILOSEC) 20 MG capsule Take 20 mg by mouth daily.     No current facility-administered medications on file prior to visit.   "

## 2024-05-12 NOTE — Assessment & Plan Note (Signed)
 Bmi of 38  Co morbidities of OSA and fatty liver   Discussed how this problem influences overall health and the risks it imposes  Reviewed plan for weight loss with lower calorie diet (via better food choices (lower glycemic and portion control) along with exercise building up to or more than 30 minutes 5 days per week including some aerobic activity and strength training   Portions are smaller  Waiting to get released to exercise   Discussed consideration of glp-1 in future if indicated

## 2024-05-12 NOTE — Assessment & Plan Note (Addendum)
 Continues to do well with fluoxetine  20 mg daily   Plans to get back to exercise soon when released by surgeon

## 2024-05-12 NOTE — Patient Instructions (Addendum)
 Get back to exercise when you are released to   Work up to total exercise 30 or more minutes 5 days per week)   Both cardio and strength training  Add some strength training to your routine, this is important for bone and brain health and can reduce your risk of falls and help your body use insulin properly and regulate weight  Light weights, exercise bands , and internet videos are a good way to start  Yoga (chair or regular), machines , floor exercises or a gym with machines are also good options      Keep working on serving size  Avoid added sugars in your diet when you can  Try to get most of your carbohydrates from produce (with the exception of white potatoes) and whole grains Eat less bread/pasta/rice/snack foods/cereals/sweets and other items from the middle of the grocery store (processed carbs)

## 2024-05-12 NOTE — Assessment & Plan Note (Signed)
 With weight loss so far has done ok w/o cpap  Continues follow up with pulmonary / plans another sleep study

## 2024-05-12 NOTE — Assessment & Plan Note (Signed)
 Last liver tests/normal Lab Results  Component Value Date   ALT 26 03/14/2024   AST 24 03/14/2024   ALKPHOS 80 03/14/2024   BILITOT 0.2 03/14/2024    Fibrosis 4 Score = 1.21 (Low risk)        Interpretation for patients with NAFLD          <1.30       -  F0-F1 (Low risk)          1.30-2.67 -  Indeterminate           >2.67      -  F3-F4 (High risk)     Validated for ages 58-65        Pt plans to work on weight loss  Discussed glp-1 as option later if not successful

## 2024-05-12 NOTE — Assessment & Plan Note (Signed)
 Did very well with sigmoid colectomy on 1/12  Making a good recovery Biggest incision is still sore but improving / seeking clothing with more support Looking forward to exercise when released   Has advanced diet and tolerating that
